# Patient Record
Sex: Male | Born: 1950
Health system: Southern US, Community
[De-identification: ages and names within clinical notes are randomized; demographics above are authoritative.]

## PROBLEM LIST (undated history)

## (undated) DIAGNOSIS — E785 Hyperlipidemia, unspecified: Secondary | ICD-10-CM

## (undated) DIAGNOSIS — M1990 Unspecified osteoarthritis, unspecified site: Secondary | ICD-10-CM

## (undated) DIAGNOSIS — F419 Anxiety disorder, unspecified: Secondary | ICD-10-CM

## (undated) DIAGNOSIS — D3A00 Benign carcinoid tumor of unspecified site: Secondary | ICD-10-CM

## (undated) DIAGNOSIS — I639 Cerebral infarction, unspecified: Secondary | ICD-10-CM

## (undated) DIAGNOSIS — K5792 Diverticulitis of intestine, part unspecified, without perforation or abscess without bleeding: Secondary | ICD-10-CM

## (undated) DIAGNOSIS — C801 Malignant (primary) neoplasm, unspecified: Secondary | ICD-10-CM

## (undated) DIAGNOSIS — Z87442 Personal history of urinary calculi: Secondary | ICD-10-CM

## (undated) DIAGNOSIS — R002 Palpitations: Secondary | ICD-10-CM

## (undated) DIAGNOSIS — H269 Unspecified cataract: Secondary | ICD-10-CM

## (undated) DIAGNOSIS — K219 Gastro-esophageal reflux disease without esophagitis: Secondary | ICD-10-CM

## (undated) DIAGNOSIS — E291 Testicular hypofunction: Secondary | ICD-10-CM

## (undated) DIAGNOSIS — G629 Polyneuropathy, unspecified: Secondary | ICD-10-CM

## (undated) DIAGNOSIS — K52832 Lymphocytic colitis: Secondary | ICD-10-CM

## (undated) DIAGNOSIS — I10 Essential (primary) hypertension: Secondary | ICD-10-CM

## (undated) DIAGNOSIS — F3289 Other specified depressive episodes: Secondary | ICD-10-CM

## (undated) DIAGNOSIS — T7840XA Allergy, unspecified, initial encounter: Secondary | ICD-10-CM

## (undated) DIAGNOSIS — J309 Allergic rhinitis, unspecified: Secondary | ICD-10-CM

## (undated) DIAGNOSIS — F329 Major depressive disorder, single episode, unspecified: Secondary | ICD-10-CM

## (undated) DIAGNOSIS — G709 Myoneural disorder, unspecified: Secondary | ICD-10-CM

## (undated) HISTORY — DX: Allergic rhinitis, unspecified: J30.9

## (undated) HISTORY — DX: Hyperlipidemia, unspecified: E78.5

## (undated) HISTORY — DX: Testicular hypofunction: E29.1

## (undated) HISTORY — DX: Other specified depressive episodes: F32.89

## (undated) HISTORY — DX: Palpitations: R00.2

## (undated) HISTORY — PX: CARDIAC CATHETERIZATION: SHX172

## (undated) HISTORY — DX: Myoneural disorder, unspecified: G70.9

## (undated) HISTORY — DX: Anxiety disorder, unspecified: F41.9

## (undated) HISTORY — DX: Essential (primary) hypertension: I10

## (undated) HISTORY — PX: TONSILLECTOMY AND ADENOIDECTOMY: SHX28

## (undated) HISTORY — DX: Unspecified cataract: H26.9

## (undated) HISTORY — DX: Polyneuropathy, unspecified: G62.9

## (undated) HISTORY — DX: Benign carcinoid tumor of unspecified site: D3A.00

## (undated) HISTORY — DX: Allergy, unspecified, initial encounter: T78.40XA

## (undated) HISTORY — DX: Cerebral infarction, unspecified: I63.9

## (undated) HISTORY — PX: POLYPECTOMY: SHX149

## (undated) HISTORY — DX: Unspecified osteoarthritis, unspecified site: M19.90

## (undated) HISTORY — DX: Personal history of urinary calculi: Z87.442

## (undated) HISTORY — DX: Lymphocytic colitis: K52.832

## (undated) HISTORY — DX: Major depressive disorder, single episode, unspecified: F32.9

## (undated) HISTORY — DX: Diverticulitis of intestine, part unspecified, without perforation or abscess without bleeding: K57.92

## (undated) HISTORY — DX: Gastro-esophageal reflux disease without esophagitis: K21.9

## (undated) HISTORY — PX: COLONOSCOPY: SHX174

---

## 1998-11-08 ENCOUNTER — Emergency Department (HOSPITAL_COMMUNITY): Admission: EM | Admit: 1998-11-08 | Discharge: 1998-11-08 | Payer: Self-pay | Admitting: Emergency Medicine

## 2000-11-04 ENCOUNTER — Ambulatory Visit (HOSPITAL_COMMUNITY): Admission: RE | Admit: 2000-11-04 | Discharge: 2000-11-04 | Payer: Self-pay | Admitting: Internal Medicine

## 2000-11-04 ENCOUNTER — Encounter: Payer: Self-pay | Admitting: Internal Medicine

## 2000-12-30 ENCOUNTER — Ambulatory Visit (HOSPITAL_COMMUNITY): Admission: RE | Admit: 2000-12-30 | Discharge: 2000-12-30 | Payer: Self-pay | Admitting: Internal Medicine

## 2004-10-06 ENCOUNTER — Emergency Department (HOSPITAL_COMMUNITY): Admission: EM | Admit: 2004-10-06 | Discharge: 2004-10-06 | Payer: Self-pay | Admitting: Emergency Medicine

## 2004-10-07 ENCOUNTER — Ambulatory Visit: Payer: Self-pay | Admitting: Internal Medicine

## 2004-10-08 ENCOUNTER — Ambulatory Visit (HOSPITAL_COMMUNITY): Admission: EM | Admit: 2004-10-08 | Discharge: 2004-10-08 | Payer: Self-pay | Admitting: Emergency Medicine

## 2005-10-05 ENCOUNTER — Ambulatory Visit (HOSPITAL_COMMUNITY): Admission: RE | Admit: 2005-10-05 | Discharge: 2005-10-05 | Payer: Self-pay | Admitting: Internal Medicine

## 2005-12-30 ENCOUNTER — Ambulatory Visit: Payer: Self-pay | Admitting: Cardiology

## 2006-01-11 ENCOUNTER — Ambulatory Visit: Payer: Self-pay | Admitting: Cardiology

## 2006-11-29 ENCOUNTER — Emergency Department (HOSPITAL_COMMUNITY): Admission: EM | Admit: 2006-11-29 | Discharge: 2006-11-29 | Payer: Self-pay | Admitting: Emergency Medicine

## 2007-05-10 ENCOUNTER — Ambulatory Visit: Payer: Self-pay | Admitting: Internal Medicine

## 2007-05-11 ENCOUNTER — Encounter: Payer: Self-pay | Admitting: Internal Medicine

## 2007-05-15 ENCOUNTER — Ambulatory Visit: Payer: Self-pay | Admitting: Internal Medicine

## 2007-05-19 ENCOUNTER — Ambulatory Visit: Payer: Self-pay | Admitting: Internal Medicine

## 2007-05-22 ENCOUNTER — Encounter: Payer: Self-pay | Admitting: Internal Medicine

## 2007-05-23 ENCOUNTER — Encounter: Payer: Self-pay | Admitting: Internal Medicine

## 2007-05-31 ENCOUNTER — Ambulatory Visit (HOSPITAL_COMMUNITY): Admission: RE | Admit: 2007-05-31 | Discharge: 2007-05-31 | Payer: Self-pay | Admitting: Internal Medicine

## 2007-06-13 DIAGNOSIS — J309 Allergic rhinitis, unspecified: Secondary | ICD-10-CM | POA: Insufficient documentation

## 2007-06-13 DIAGNOSIS — K219 Gastro-esophageal reflux disease without esophagitis: Secondary | ICD-10-CM | POA: Insufficient documentation

## 2007-06-13 DIAGNOSIS — Z9089 Acquired absence of other organs: Secondary | ICD-10-CM | POA: Insufficient documentation

## 2007-06-13 DIAGNOSIS — Z87442 Personal history of urinary calculi: Secondary | ICD-10-CM | POA: Insufficient documentation

## 2007-06-13 DIAGNOSIS — K589 Irritable bowel syndrome without diarrhea: Secondary | ICD-10-CM | POA: Insufficient documentation

## 2007-06-14 ENCOUNTER — Encounter: Admission: RE | Admit: 2007-06-14 | Discharge: 2007-06-14 | Payer: Self-pay | Admitting: Internal Medicine

## 2007-06-15 ENCOUNTER — Ambulatory Visit: Payer: Self-pay | Admitting: Internal Medicine

## 2007-06-21 ENCOUNTER — Ambulatory Visit: Payer: Self-pay | Admitting: Internal Medicine

## 2007-06-21 ENCOUNTER — Encounter: Payer: Self-pay | Admitting: Internal Medicine

## 2007-11-21 ENCOUNTER — Ambulatory Visit: Payer: Self-pay | Admitting: Internal Medicine

## 2007-11-21 DIAGNOSIS — R7989 Other specified abnormal findings of blood chemistry: Secondary | ICD-10-CM | POA: Insufficient documentation

## 2007-12-13 ENCOUNTER — Ambulatory Visit: Payer: Self-pay | Admitting: Internal Medicine

## 2008-03-04 ENCOUNTER — Telehealth (INDEPENDENT_AMBULATORY_CARE_PROVIDER_SITE_OTHER): Payer: Self-pay | Admitting: *Deleted

## 2008-03-06 ENCOUNTER — Ambulatory Visit: Payer: Self-pay | Admitting: Internal Medicine

## 2008-03-08 ENCOUNTER — Encounter: Admission: RE | Admit: 2008-03-08 | Discharge: 2008-03-08 | Payer: Self-pay | Admitting: Internal Medicine

## 2008-07-15 ENCOUNTER — Telehealth: Payer: Self-pay | Admitting: Internal Medicine

## 2008-07-23 ENCOUNTER — Ambulatory Visit: Payer: Self-pay | Admitting: Internal Medicine

## 2008-07-23 LAB — CONVERTED CEMR LAB: Testosterone: 1108.26 ng/dL — ABNORMAL HIGH (ref 350.00–890.00)

## 2008-07-28 ENCOUNTER — Encounter: Payer: Self-pay | Admitting: Internal Medicine

## 2008-11-20 ENCOUNTER — Ambulatory Visit: Payer: Self-pay | Admitting: Internal Medicine

## 2008-11-20 DIAGNOSIS — R7989 Other specified abnormal findings of blood chemistry: Secondary | ICD-10-CM | POA: Insufficient documentation

## 2008-11-20 LAB — CONVERTED CEMR LAB
ALT: 27 units/L (ref 0–53)
BUN: 12 mg/dL (ref 6–23)
Basophils Absolute: 0 10*3/uL (ref 0.0–0.1)
CO2: 30 meq/L (ref 19–32)
Calcium: 9.3 mg/dL (ref 8.4–10.5)
Cholesterol: 185 mg/dL (ref 0–200)
Eosinophils Absolute: 0.2 10*3/uL (ref 0.0–0.7)
Glucose, Bld: 99 mg/dL (ref 70–99)
Hemoglobin: 17.2 g/dL — ABNORMAL HIGH (ref 13.0–17.0)
Homocysteine: 8.5 micromoles/L (ref 4.0–15.4)
LDL Cholesterol: 118 mg/dL — ABNORMAL HIGH (ref 0–99)
Lymphs Abs: 1.8 10*3/uL (ref 0.7–4.0)
Monocytes Absolute: 0.7 10*3/uL (ref 0.1–1.0)
Neutro Abs: 3.7 10*3/uL (ref 1.4–7.7)
Neutrophils Relative %: 58 % (ref 43.0–77.0)
Testosterone: 206.13 ng/dL — ABNORMAL LOW (ref 350.00–890.00)
Total Bilirubin: 1.4 mg/dL — ABNORMAL HIGH (ref 0.3–1.2)
Total Protein: 7.2 g/dL (ref 6.0–8.3)

## 2009-02-13 ENCOUNTER — Telehealth: Payer: Self-pay | Admitting: Internal Medicine

## 2009-02-14 ENCOUNTER — Ambulatory Visit: Payer: Self-pay | Admitting: Internal Medicine

## 2009-04-05 DIAGNOSIS — D3A00 Benign carcinoid tumor of unspecified site: Secondary | ICD-10-CM

## 2009-04-05 HISTORY — DX: Benign carcinoid tumor of unspecified site: D3A.00

## 2009-06-03 ENCOUNTER — Telehealth: Payer: Self-pay | Admitting: Internal Medicine

## 2009-09-03 HISTORY — PX: OTHER SURGICAL HISTORY: SHX169

## 2009-09-08 ENCOUNTER — Ambulatory Visit: Payer: Self-pay | Admitting: Endocrinology

## 2009-09-08 LAB — CONVERTED CEMR LAB
Basophils Absolute: 0 10*3/uL (ref 0.0–0.1)
Basophils Relative: 0.3 % (ref 0.0–3.0)
Eosinophils Relative: 1.3 % (ref 0.0–5.0)
HCT: 50.6 % (ref 39.0–52.0)
Ketones, ur: NEGATIVE mg/dL
Lymphocytes Relative: 13.8 % (ref 12.0–46.0)
Monocytes Relative: 11.7 % (ref 3.0–12.0)
Nitrite: NEGATIVE
RBC: 5.27 M/uL (ref 4.22–5.81)
RDW: 11.9 % (ref 11.5–14.6)
Urine Glucose: NEGATIVE mg/dL
Urobilinogen, UA: 0.2 (ref 0.0–1.0)

## 2009-09-09 ENCOUNTER — Encounter: Admission: RE | Admit: 2009-09-09 | Discharge: 2009-09-09 | Payer: Self-pay | Admitting: Endocrinology

## 2009-09-09 ENCOUNTER — Telehealth (INDEPENDENT_AMBULATORY_CARE_PROVIDER_SITE_OTHER): Payer: Self-pay | Admitting: *Deleted

## 2009-09-09 ENCOUNTER — Telehealth: Payer: Self-pay | Admitting: Endocrinology

## 2009-09-09 DIAGNOSIS — R911 Solitary pulmonary nodule: Secondary | ICD-10-CM | POA: Insufficient documentation

## 2009-09-10 ENCOUNTER — Encounter: Admission: RE | Admit: 2009-09-10 | Discharge: 2009-09-10 | Payer: Self-pay | Admitting: Obstetrics and Gynecology

## 2009-09-10 ENCOUNTER — Telehealth: Payer: Self-pay | Admitting: Endocrinology

## 2009-09-11 ENCOUNTER — Encounter (INDEPENDENT_AMBULATORY_CARE_PROVIDER_SITE_OTHER): Payer: Self-pay | Admitting: *Deleted

## 2009-09-17 ENCOUNTER — Ambulatory Visit (HOSPITAL_COMMUNITY): Admission: RE | Admit: 2009-09-17 | Discharge: 2009-09-17 | Payer: Self-pay | Admitting: Internal Medicine

## 2009-09-18 ENCOUNTER — Telehealth: Payer: Self-pay | Admitting: Internal Medicine

## 2009-09-23 ENCOUNTER — Ambulatory Visit: Payer: Self-pay | Admitting: Thoracic Surgery

## 2009-09-24 ENCOUNTER — Ambulatory Visit (HOSPITAL_COMMUNITY): Admission: RE | Admit: 2009-09-24 | Discharge: 2009-09-24 | Payer: Self-pay | Admitting: Thoracic Surgery

## 2009-09-25 ENCOUNTER — Encounter: Admission: RE | Admit: 2009-09-25 | Discharge: 2009-09-25 | Payer: Self-pay | Admitting: Thoracic Surgery

## 2009-09-29 ENCOUNTER — Telehealth: Payer: Self-pay | Admitting: Internal Medicine

## 2009-09-29 ENCOUNTER — Encounter: Payer: Self-pay | Admitting: Internal Medicine

## 2009-09-29 ENCOUNTER — Ambulatory Visit: Payer: Self-pay | Admitting: Cardiothoracic Surgery

## 2009-09-29 ENCOUNTER — Ambulatory Visit: Admission: RE | Admit: 2009-09-29 | Discharge: 2009-09-29 | Payer: Self-pay | Admitting: Thoracic Surgery

## 2009-09-30 ENCOUNTER — Encounter (INDEPENDENT_AMBULATORY_CARE_PROVIDER_SITE_OTHER): Payer: Self-pay | Admitting: *Deleted

## 2009-09-30 ENCOUNTER — Encounter: Admission: RE | Admit: 2009-09-30 | Discharge: 2009-09-30 | Payer: Self-pay | Admitting: Internal Medicine

## 2009-10-02 ENCOUNTER — Ambulatory Visit: Payer: Self-pay | Admitting: Cardiothoracic Surgery

## 2009-10-07 ENCOUNTER — Inpatient Hospital Stay (HOSPITAL_COMMUNITY): Admission: RE | Admit: 2009-10-07 | Discharge: 2009-10-11 | Payer: Self-pay | Admitting: Cardiothoracic Surgery

## 2009-10-07 ENCOUNTER — Encounter: Payer: Self-pay | Admitting: Cardiothoracic Surgery

## 2009-10-07 ENCOUNTER — Ambulatory Visit: Payer: Self-pay | Admitting: Cardiothoracic Surgery

## 2009-10-16 ENCOUNTER — Ambulatory Visit: Payer: Self-pay | Admitting: Cardiothoracic Surgery

## 2009-10-28 ENCOUNTER — Ambulatory Visit: Payer: Self-pay | Admitting: Cardiothoracic Surgery

## 2009-10-28 ENCOUNTER — Encounter: Admission: RE | Admit: 2009-10-28 | Discharge: 2009-10-28 | Payer: Self-pay | Admitting: Cardiothoracic Surgery

## 2009-10-28 ENCOUNTER — Encounter: Payer: Self-pay | Admitting: Internal Medicine

## 2009-10-29 ENCOUNTER — Ambulatory Visit: Payer: Self-pay | Admitting: Internal Medicine

## 2009-11-03 ENCOUNTER — Encounter: Payer: Self-pay | Admitting: Internal Medicine

## 2009-11-03 LAB — CBC WITH DIFFERENTIAL/PLATELET
Eosinophils Absolute: 0.2 10*3/uL (ref 0.0–0.5)
HCT: 44 % (ref 38.4–49.9)
LYMPH%: 31.2 % (ref 14.0–49.0)
MCHC: 34 g/dL (ref 32.0–36.0)
MCV: 94.6 fL (ref 79.3–98.0)
MONO%: 11 % (ref 0.0–14.0)
NEUT#: 3.4 10*3/uL (ref 1.5–6.5)
NEUT%: 54.5 % (ref 39.0–75.0)
Platelets: 188 10*3/uL (ref 140–400)
RBC: 4.65 10*6/uL (ref 4.20–5.82)

## 2009-11-03 LAB — COMPREHENSIVE METABOLIC PANEL
Alkaline Phosphatase: 66 U/L (ref 39–117)
Creatinine, Ser: 0.95 mg/dL (ref 0.40–1.50)
Glucose, Bld: 93 mg/dL (ref 70–99)
Sodium: 140 mEq/L (ref 135–145)
Total Bilirubin: 1.3 mg/dL — ABNORMAL HIGH (ref 0.3–1.2)
Total Protein: 6.8 g/dL (ref 6.0–8.3)

## 2009-12-24 ENCOUNTER — Telehealth: Payer: Self-pay | Admitting: Internal Medicine

## 2010-02-05 ENCOUNTER — Ambulatory Visit: Payer: Self-pay | Admitting: Cardiothoracic Surgery

## 2010-02-05 ENCOUNTER — Encounter: Admission: RE | Admit: 2010-02-05 | Discharge: 2010-02-05 | Payer: Self-pay | Admitting: Cardiothoracic Surgery

## 2010-02-05 ENCOUNTER — Encounter: Payer: Self-pay | Admitting: Internal Medicine

## 2010-02-16 ENCOUNTER — Ambulatory Visit: Payer: Self-pay | Admitting: Internal Medicine

## 2010-02-16 DIAGNOSIS — F32A Depression, unspecified: Secondary | ICD-10-CM | POA: Insufficient documentation

## 2010-02-16 DIAGNOSIS — F329 Major depressive disorder, single episode, unspecified: Secondary | ICD-10-CM

## 2010-02-16 DIAGNOSIS — R209 Unspecified disturbances of skin sensation: Secondary | ICD-10-CM | POA: Insufficient documentation

## 2010-02-16 LAB — CONVERTED CEMR LAB
BUN: 13 mg/dL (ref 6–23)
CO2: 29 meq/L (ref 19–32)
Chloride: 101 meq/L (ref 96–112)
Creatinine, Ser: 1 mg/dL (ref 0.4–1.5)
Folate: 15.2 ng/mL
GFR calc non Af Amer: 82.99 mL/min (ref >60)
Glucose, Bld: 97 mg/dL (ref 70–99)
Hgb A1c MFr Bld: 5.5 % (ref 4.6–6.5)
Iron: 102 ug/dL (ref 42–165)
Sodium: 137 meq/L (ref 135–145)
TSH: 2.07 microintl units/mL (ref 0.35–5.50)

## 2010-05-04 ENCOUNTER — Other Ambulatory Visit: Payer: Self-pay | Admitting: Internal Medicine

## 2010-05-04 DIAGNOSIS — C3490 Malignant neoplasm of unspecified part of unspecified bronchus or lung: Secondary | ICD-10-CM

## 2010-05-07 NOTE — Letter (Signed)
Summary: [facility]  [facility]   Imported By: Sherian Rein 11/11/2009 10:51:58  _____________________________________________________________________  External Attachment:    Type:   Image     Comment:   External Document

## 2010-05-07 NOTE — Progress Notes (Signed)
  Phone Note Refill Request Message from:  Fax from Pharmacy on June 03, 2009 9:38 AM  Refills Requested: Medication #1:  KLONOPIN 0.5 MG  TABS Take 1 tablet by mouth two times a day as needed   Last Refilled: 04/23/2009 Please Advise refill, [facility] [phone])  Initial call taken by: Ami Bullins CMA,  June 03, 2009 9:39 AM  Follow-up for Phone Call        ok to refill x 5 Follow-up by: Jacques Navy MD,  June 03, 2009 11:48 AM    Prescriptions: KLONOPIN 0.5 MG  TABS (CLONAZEPAM) Take 1 tablet by mouth two times a day as needed  #60 x 6   Entered by:   Ami Bullins CMA   Authorized by:   Jacques Navy MD   Signed by:   Bill Salinas CMA on 06/03/2009   Method used:   Telephoned to ...       [facility] [address] (retail)       [address]       [place], [place]  [address]       Ph: [phone] or [phone]       Fax: [phone]   RxID:   [id number]

## 2010-05-07 NOTE — Progress Notes (Signed)
  Phone Note Refill Request Message from:  Fax from Pharmacy on December 24, 2009 9:46 AM  Refills Requested: Medication #1:  KLONOPIN 0.5 MG  TABS Take 1 tablet by mouth two times a day as needed   Last Refilled: 11/24/2009 Please Advise refill, [facility] [address]  Initial call taken by: Ami Bullins CMA,  December 24, 2009 9:46 AM  Follow-up for Phone Call        ok to refill x 5 Follow-up by: Jacques Navy MD,  December 24, 2009 1:10 PM    Prescriptions: KLONOPIN 0.5 MG  TABS (CLONAZEPAM) Take 1 tablet by mouth two times a day as needed  #60 x 5   Entered by:   Ami Bullins CMA   Authorized by:   Jacques Navy MD   Signed by:   Bill Salinas CMA on 12/25/2009   Method used:   Telephoned to ...       [facility] [address] (retail)       [address]       [place], [place]  [address]       Ph: [phone] or [phone]       Fax: [phone]   RxID:   [id number]

## 2010-05-07 NOTE — Letter (Signed)
Summary: [facility]  [facility]   Imported By: Lennie Odor 10/27/2009 16:09:10  _____________________________________________________________________  External Attachment:    Type:   Image     Comment:   External Document

## 2010-05-07 NOTE — Progress Notes (Signed)
Summary: Rx req  Phone Note Call from Patient Call back at Home Phone [phone]   Caller: Patient Summary of Call: Pt called stating that he is having significant spasms due to Diverticulitis. Pt is requesting Rx to treat, please advise. Initial call taken by: Margaret Pyle, CMA,  September 10, 2009 8:53 AM  Follow-up for Phone Call        i sent rx Follow-up by: Minus Breeding MD,  September 10, 2009 9:28 AM  Additional Follow-up for Phone Call Additional follow up Details #1::        pt informed Additional Follow-up by: Margaret Pyle, CMA,  September 10, 2009 9:51 AM    New/Updated Medications: HYOSCYAMINE SULFATE 0.125 MG TABS (HYOSCYAMINE SULFATE) 1 every 4 hrs as needed for spasms Prescriptions: HYOSCYAMINE SULFATE 0.125 MG TABS (HYOSCYAMINE SULFATE) 1 every 4 hrs as needed for spasms  #30 x 1   Entered and Authorized by:   Minus Breeding MD   Signed by:   Minus Breeding MD on 09/10/2009   Method used:   Electronically to        [facility] [phone]* (retail)       [address]       [place], [place]  [address]       Ph: [phone] or [phone]       Fax: [phone]   RxID:   [id number]

## 2010-05-07 NOTE — Letter (Signed)
Summary: [facility]  [facility]   Imported By: Sherian Rein 02/20/2010 10:32:54  _____________________________________________________________________  External Attachment:    Type:   Image     Comment:   External Document

## 2010-05-07 NOTE — Letter (Signed)
[facility]   Imported By: Sherian Rein 11/19/2009 11:29:38  _____________________________________________________________________  External Attachment:    Type:   Image     Comment:   External Document

## 2010-05-07 NOTE — Miscellaneous (Signed)
Summary: Orders Update   Clinical Lists Changes  Orders: Added new Referral order of Radiology Referral (Radiology) - Signed 

## 2010-05-07 NOTE — Miscellaneous (Signed)
Summary: Orders Update   Clinical Lists Changes  Orders: Added new Referral order of Radiology Referral (Radiology) - Signed Added new Referral order of Surgical Referral (Surgery) - Signed

## 2010-05-07 NOTE — Progress Notes (Signed)
  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 Pt has abx from Dr Everardo All. Initial call taken by: Loreen Freud DO,  September 09, 2009 5:27 PM

## 2010-05-07 NOTE — Progress Notes (Signed)
  Phone Note Outgoing Call   Summary of Call: called to check on diverticulitis. PET scan shows continued inflammation and wall thickening. He is still having pain. Will renew augmentin 875 two times a day x 7  Discussed PET and pulmonary nodule. Best would be [facility] with surgery for cure. He is being referred to Dr. Edwyna Shell. Initial call taken by: Jacques Navy MD,  September 18, 2009 8:19 AM    Prescriptions: AUGMENTIN 875-125 MG TABS (AMOXICILLIN-POT CLAVULANATE) 1 tab three times a day  #21 x 0   Entered and Authorized by:   Jacques Navy MD   Signed by:   Jacques Navy MD on 09/18/2009   Method used:   Electronically to        [facility] [phone]* (retail)       [address]       [place], [place]  [address]       Ph: [phone] or [phone]       Fax: [phone]   RxID:   [id number]

## 2010-05-07 NOTE — Progress Notes (Signed)
Summary: LLQ pain  Phone Note Call from Patient Call back at Home Phone [phone]   Caller: Patient Summary of Call: pt called stating that pain in LLQ has increased. Pt is requesting CT with orders to [facility] where he works. Pt is also requesting Rx for pain meds, please advise. Initial call taken by: Margaret Pyle, CMA,  September 09, 2009 9:08 AM  Follow-up for Phone Call        we need bun and creat v58.69. i have ordered ct  Additional Follow-up for Phone Call Additional follow up Details #1::        Pt informed, [facility]ed [facility] [address] per pt request Additional Follow-up by: Margaret Pyle, CMA,  September 09, 2009 10:23 AM    New/Updated Medications: VICODIN 5-500 MG TABS (HYDROCODONE-ACETAMINOPHEN) 1 every 4 hrs as needed for pain Prescriptions: VICODIN 5-500 MG TABS (HYDROCODONE-ACETAMINOPHEN) 1 every 4 hrs as needed for pain  #50 x 0   Entered and Authorized by:   Minus Breeding MD   Signed by:   Minus Breeding MD on 09/09/2009   Method used:   Printed then faxed to ...       [facility] [address] (retail)       [address]       [place], [place]  [address]       Ph: [phone] or [phone]       Fax: [phone]   RxID:   [id number]

## 2010-05-07 NOTE — Assessment & Plan Note (Signed)
Summary: DIVERTICULITIS? /NWS   Vital Signs:  Patient profile:   60 year old male Height:      70 inches (177.80 cm) Weight:      177.12 pounds (80.51 kg) O2 Sat:      94 % on Room air Temp:     98.3 degrees F (36.83 degrees C) oral Pulse rate:   78 / minute BP sitting:   130 / 72  (left arm) Cuff size:   regular  Vitals Entered By: Orlan Leavens (September 08, 2009 9:06 AM)  O2 Flow:  Room air CC: diverticulitis flare-up Is Patient Diabetic? No   Primary Provider:  Nor[facility]  CC:  diverticulitis flare-up.  History of Present Illness: 2 days of moderate pain at the left lower quadrant.  the pain is temporarily helped by bowel movement.  no assoc fever.  he has h/o urolithiasis, but says this pain is much different.    Current Medications (verified): 1)  Klonopin 0.5 Mg  Tabs (Clonazepam) .... Take 1 Tablet By Mouth Two Times A Day As Needed 2)  Vitamin D 2000 Unit  Tabs (Cholecalciferol) .... Take 1 Tablet By Mouth Once A Day 3)  Fish Oil 1000 Mg  Caps (Omega-3 Fatty Acids) .... Take 1 Tablet By Mouth Three Times A Day 4)  Multivitamins   Tabs (Multiple Vitamin) .... Take 1 Tablet By Mouth Once A Day 5)  Fexofenadine Hcl 180 Mg Tabs (Fexofenadine Hcl) .Marland Kitchen.. 1 By Mouth Once Daily For Allergy 6)  Aerochamber W/flowsignal  Misc (Spacer/aero-Holding [place]) .... Use With Ventolin 7)  Ventolin Hfa 108 (90 Base) Mcg/act Aers (Albuterol Sulfate) .... 2 Puffs Q 6 Hrs As Needed Wheezing 8)  Testosterone Enanthate 200 Mg/ml Oil (Testosterone Enanthate) .... 200mg  Im Monthly 9)  B Complex .Marland KitchenMarland Kitchen. 1 Daily 10)  Co Q10 .Marland KitchenMarland Kitchen. 1 Daily 11)  Pepto Bismol .... 2-3 Times A Day 12)  Vitamin C 500mg  .... 1 Daily 13)  Reveratrol 14)  Wellbutrin Xl 150 Mg Xr24h-Tab (Bupropion Hcl) .Marland Kitchen.. 1 By Mouth Once Daily  Allergies (verified): 1)  ! * Bee Stings 2)  Nsaids  Past History:  Past Medical History: Last updated: 11/21/2007 DEPRESSION, HX OF (ICD-V11.8) GERD (ICD-530.81) Hx of ALLERGIC RHINITIS  (ICD-477.9) UROLITHIASIS, HX OF (ICD-V13.01) IRRITABLE BOWEL SYNDROME, HX OF (ICD-V12.79) DIARRHEA (ICD-787.91) Hypogonadism      Review of Systems  The patient denies hematochezia and hematuria.    Physical Exam  General:  normal appearance.   Abdomen:  abdomen is soft.  there is moderate point tenderness at the left lower quadrant.  no hepatosplenomegaly.   not distended.  no hernia  Additional Exam:  White Cell Count          8.7 K/uL                    4.5-10.5   Red Cell Count            5.27 Mil/uL                 4.22-5.81   Hemoglobin           [H]  17.6 g/dL                   04.5-40.9   Hematocrit                50.6 %  39.0-52.0    Platelet Count            212.0 K/uL                  150.0-400.0    Tests: (2) UDip w/Micro (URINE)   Color                     YELLOW       RANGE:  Yellow;Lt. Yellow   Clarity                   CLEAR                       Clear   Specific Gravity          1.025                       1.000 - 1.030   Urine Ph                  6.0                         5.0-8.0   Protein                   NEGATIVE                    Negative   Urine Glucose             NEGATIVE                    Negative   Ketones                   NEGATIVE                    Negative   Urine Bilirubin           NEGATIVE                    Negative   Blood                     NEGATIVE                    Negative   Urobilinogen              0.2                         0.0 - 1.0   Leukocyte Esterace        NEGATIVE                    Negative   Nitrite             Impression & Recommendations:  Problem # 1:  ABDOMINAL PAIN, UNSPECIFIED SITE (ICD-789.00) most likely dx is diverticulitis  Medications Added to Medication List This Visit: 1)  Augmentin 875-125 Mg Tabs (Amoxicillin-pot clavulanate) .Marland Kitchen.. 1 tab three times a day 2)  Metronidazole 500 Mg Tabs (Metronidazole) .Marland Kitchen.. 1 tab three times a day  Other Orders: TLB-CBC Platelet - w/Differential  (85025-CBCD) TLB-Udip w/ Micro (81001-URINE) Est. Patient Level III ([address])  Patient Instructions: 1)  continue metamucil. 2)  augmentin 875 mg three times a day 3)  metronidazole 500 mg three times a day 4)  blood and urine tests are being ordered for you today.  please call [phone] to hear your test results. 5)  please call us in 2 days if you are not feeling better. 6)  (update: i left message on phone-tree:  rx as we discussed) Prescriptions: METRONIDAZOLE 500 MG TABS (METRONIDAZOLE) 1 tab three times a day  #21 x 0   Entered and Authorized by:   Minus Breeding MD   Signed by:   Minus Breeding MD on 09/08/2009   Method used:   [facility] to        [facility] [phone]* (retail)       [address]       [place], [place]  [address]       Ph: [phone] or [phone]       Fax: [phone]   RxID:   [id number] AUGMENTIN 875-125 MG TABS (AMOXICILLIN-POT CLAVULANATE) 1 tab three times a day  #21 x 0   Entered and Authorized by:   Minus Breeding MD   Signed by:   Minus Breeding MD on 09/08/2009   Method used:   Electronically to        [facility] [phone]* (retail)       [address]       [place], [place]  [address]       Ph: [phone] or [phone]       Fax: [phone]   RxID:   [id number]

## 2010-05-07 NOTE — Assessment & Plan Note (Signed)
Summary: FOOT PAIN  D/T REFUSED ANOTHER MD--[facility]   Vital Signs:  Patient profile:   60 year old male Height:      70 inches Weight:      177 pounds BMI:     25.49 O2 Sat:      97 % on Room air Temp:     97.6 degrees F oral Pulse rate:   73 / minute BP sitting:   118 / 84  (left arm) Cuff size:   regular  Vitals Entered By: Bill Salinas CMA (February 16, 2010 11:35 AM)  O2 Flow:  Room air CC: pt here with c/o nueropathy in both feet. Pain worse at night. constant pain/ ab   Primary Care Provider:  Norins  CC:  pt here with c/o nueropathy in both feet. Pain worse at night. constant pain/ ab.  History of Present Illness: Patient presents with several months of pain in the feet and legs. He reports that after he recovered from his minithoracotomy and resection he started running. He then developed significant pain in his feet. He stopped running for walking but soon had to give this up as well due to on-going discomfort. He then developed a tingling sensation in is hands and a burning pain in his feet. He is concerned about a nueropathy of some sort. He has eliminated caffein, artificial sweeteners from his diet. He has been taking a Vt B supplement. He reports some improvement but still is symptomatic. He has concerns for possible diabetic etiology vs B12 deficiency vs some relationship to his surgery.   He also reports that he has stopped testosterone replacement on advice of his oncologist, seen due to his excised carcinoid tumor-lung. He does report a general decline in overall sense of well-being vis-a-vis endurance, quick and sharp mind, etc. and would like a testosterone level checked.   Current Medications (verified): 1)  Klonopin 0.5 Mg  Tabs (Clonazepam) .... Take 1 Tablet By Mouth Two Times A Day As Needed 2)  Vitamin D 2000 Unit  Tabs (Cholecalciferol) .... Take 1 Tablet By Mouth Once A Day 3)  Fish Oil 1000 Mg  Caps (Omega-3 Fatty Acids) .... Take 1 Tablet By Mouth Three Times  A Day 4)  Multivitamins   Tabs (Multiple Vitamin) .... Take 1 Tablet By Mouth Once A Day 5)  Fexofenadine Hcl 180 Mg Tabs (Fexofenadine Hcl) .Marland Kitchen.. 1 By Mouth Once Daily For Allergy 6)  B Complex .Marland KitchenMarland Kitchen. 1 Daily 7)  Co Q10 .Marland KitchenMarland Kitchen. 1 Daily 8)  Pepto Bismol .... 2-3 Times A Day 9)  Vitamin C 500mg  .... 1 Daily 10)  Reveratrol 11)  Wellbutrin Xl 150 Mg Xr24h-Tab (Bupropion Hcl) .Marland Kitchen.. 1 By Mouth Once Daily 12)  Metronidazole 500 Mg Tabs (Metronidazole) .Marland Kitchen.. 1 Tab Three Times A Day 13)  Hyoscyamine Sulfate 0.125 Mg Tabs (Hyoscyamine Sulfate) .Marland Kitchen.. 1 Every 4 Hrs As Needed For Spasms 14)  Metanx 3-35-2 Mg Tabs (L-Methylfolate-B6-B12) .Marland Kitchen.. 1 Tablet Daily  Allergies (verified): 1)  ! 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    Physical Exam  General:  Well-developed,well-nourished,in no acute distress; alert,appropriate and cooperative throughout examination Head:  normocephalic and  atraumatic.   Eyes:  vision grossly intact, pupils equal, and pupils round.  C&S clear Msk:  no joint tenderness, no joint swelling, no joint warmth, and no joint deformities.  Tender to pressure at the heel, at the in-step mid-foot. Pulses:  2+ PT and DP pulses Neurologic:  alert & oriented X3, sensation intact to light touch, and sensation intact to pinprick, slight decrease in deep vibratory sensation.   Skin:  turgor normal, color normal, and no rashes.   Psych:  normally interactive, good eye contact, and not anxious appearing.     Impression & Recommendations:  Problem # 1:  PARESTHESIA (ICD-782.0) Persistent but improved burning paresthesia in his feet and some tingling in his fingers. Good circulation. minimal neurologic deficit. Concerned for prolonged plantar fascititis and tendonitis of the feet vs metabolic derangement.  Plan - labs to r/o metabolic abnormality.           if labs are all normal can consider EMG/NCS vs trial of empiric treatment with either elavil or neurontin           recommended myofascial release exercise for feet and directed him to youtube for demonstration videos.  Orders: TLB-B12 + Folate Pnl (16109_60454-U98/JXB) TLB-IBC Pnl (Iron/FE;Transferrin) (83550-IBC) TLB-TSH (Thyroid Stimulating Hormone) (84443-TSH) TLB-BMP (Basic Metabolic Panel-BMET) (80048-METABOL) TLB-A1C / Hgb A1C (Glycohemoglobin) (83036-A1C) TLB-Testosterone, Total (84403-TESTO)  Addendum - normal labs with suprnormal B12 level  Plan - will send in Rx for elavil 50mg  at bedtime as a trial  Problem # 2:  HYPOGONADISM, MALE (ICD-257.2) Testosterone level returns as low. May be a cuase of some symptoms.   Complete Medication List: 1)  Klonopin 0.5 Mg Tabs (Clonazepam) .... Take 1 tablet by mouth two times a day as needed 2)  Vitamin D 2000 Unit Tabs (Cholecalciferol) .... Take 1 tablet by mouth once a day 3)  Fish Oil 1000 Mg Caps (Omega-3 fatty acids) .... Take 1 tablet by mouth  three times a day 4)  Multivitamins Tabs (Multiple vitamin) .... Take 1 tablet by mouth once a day 5)  Fexofenadine Hcl 180 Mg Tabs (Fexofenadine hcl) .Marland Kitchen.. 1 by mouth once daily for allergy 6)  B Complex  .Marland KitchenMarland Kitchen. 1 daily 7)  Co Q10  .Marland Kitchen.. 1 daily 8)  Pepto Bismol  .... 2-3 times a day 9)  Vitamin C 500mg   .... 1 daily 10)  Reveratrol  11)  Wellbutrin Xl 150 Mg Xr24h-tab (Bupropion hcl) .Marland Kitchen.. 1 by mouth once daily 12)  Metronidazole 500 Mg Tabs (Metronidazole) .Marland Kitchen.. 1 tab three times a day 13)  Hyoscyamine Sulfate 0.125 Mg Tabs (Hyoscyamine sulfate) .Marland Kitchen.. 1 every 4 hrs as needed for spasms 14)  Metanx 3-35-2 Mg Tabs (L-methylfolate-b6-b12) .Marland Kitchen.. 1 tablet daily 15)  Amitriptyline Hcl 50 Mg Tabs (Amitriptyline hcl) .Marland Kitchen.. 1 by mouth at bedtime for paresthesia   Patient: [name] Page Note: All result statuses are Final unless otherwise noted.  Tests: (1) B12 + Folate Panel (B12/FOL)   Vitamin B12          [H]  >1500  pg/mL                 211-911   Folate                    15.2 ng/mL     Deficient  0.4 - 3.4 ng/mL     Indeterminate  3.4 - 5.4 ng/mL     Normal  >5.4 ng/mL  Tests: (2) IBC Panel (IBC)   Iron                      102 ug/dL                   16-109   Transferrin               263.9 mg/dL                 604.5-409.8   Iron Saturation           27.6 %                      20.0-50.0  Tests: (3) TSH (TSH)   FastTSH                   2.07 uIU/mL                 0.35-5.50  Tests: (4) BMP (METABOL)   Sodium                    137 mEq/L                   135-145   Potassium                 4.4 mEq/L                   3.5-5.1   Chloride                  101 mEq/L                   96-112   Carbon Dioxide            29 mEq/L                    19-32   Glucose                   97 mg/dL                    11-91   BUN                       13 mg/dL                    4-78   Creatinine                1.0 mg/dL                   2.9-5.6   Calcium                   9.4 mg/dL                    2.1-30.8   GFR  82.99 mL/min                >60  Tests: (5) Hemoglobin A1C (A1C)   Hemoglobin A1C            5.5 %                       4.6-6.5     Glycemic Control Guidelines for People with Diabetes:     Non Diabetic:  <6%     Goal of Therapy: <7%     Additional Action Suggested:  >8%   Tests: (6) Testosterone, Total (TESTO)   Testosterone         [L]  245.75 ng/dL                119.14-782.95AOZHYQMVHQION: AMITRIPTYLINE HCL 50 MG TABS (AMITRIPTYLINE HCL) 1 by mouth at bedtime for paresthesia  #30 x 2   Entered and Authorized by:   Jacques Navy MD   Signed by:   Jacques Navy MD on 02/16/2010   Method used:   [facility]ronically to        [facility] [phone]* (retail)       [address]       [place], [place]  [address]       Ph: [phone] or [phone]       Fax: [phone]   RxID:   [id number]    Orders Added: 1)  TLB-B12 + Folate Pnl [82746_82607-B12/FOL] 2)  TLB-IBC Pnl (Iron/FE;Transferrin) [83550-IBC] 3)  TLB-TSH (Thyroid Stimulating Hormone) [84443-TSH] 4)  TLB-BMP (Basic Metabolic Panel-BMET) [80048-METABOL] 5)  TLB-A1C / Hgb A1C (Glycohemoglobin) [83036-A1C] 6)  TLB-Testosterone, Total [84403-TESTO] 7)  Est. Patient Level III [01601]

## 2010-05-07 NOTE — Progress Notes (Signed)
Summary: CT Chest?  Phone Note Call from Patient Call back at Home Phone [phone]   Caller: Patient Summary of Call: Pt called stating he spoke with Radiologist at [facility], where he also works, and Radiologist suggests pt have CT Chest w contrast. Pt is requesting MD change radiology order to with contrast. Please advise. Initial call taken by: Margaret Pyle, CMA,  September 10, 2009 10:22 AM  Follow-up for Phone Call        i changed Follow-up by: Minus Breeding MD,  September 10, 2009 12:51 PM  Additional Follow-up for Phone Call Additional follow up Details #1::        Pt informed Additional Follow-up by: Margaret Pyle, CMA,  September 10, 2009 2:19 PM

## 2010-05-07 NOTE — Progress Notes (Signed)
  Phone Note Call from Patient   Reason for Call: Acute Illness Summary of Call: 1) VATS procedure cancelled due to Dr. Edwyna Shell sustaining a fracture of the shoulder 2) completed 14 days of Augmentin for diverticulitis but he has recurrent dull pain in the LLQ. No fever, no acute pain, no change in bowel habit. Plan - will add on an additional 5 days of augmentin 875 two times a day.  eScribed to pharmacy. Initial call taken by: Jacques Navy MD,  September 29, 2009 8:53 AM    Prescriptions: AUGMENTIN 875-125 MG TABS (AMOXICILLIN-POT CLAVULANATE) 1 tab three times a day  #10 x 0   Entered and Authorized by:   Jacques Navy MD   Signed by:   Jacques Navy MD on 09/29/2009   Method used:   [facility] [phone][facility] (retail)       [address]       [place], [place]  [address]       Ph: [phone] or [phone]       Fax: [phone]   RxID:   [id number]

## 2010-05-15 ENCOUNTER — Other Ambulatory Visit: Payer: Self-pay

## 2010-05-15 ENCOUNTER — Other Ambulatory Visit: Payer: Self-pay | Admitting: Internal Medicine

## 2010-05-15 ENCOUNTER — Ambulatory Visit
Admission: RE | Admit: 2010-05-15 | Discharge: 2010-05-15 | Disposition: A | Discharge status: Home / Self Care | Payer: PRIVATE HEALTH INSURANCE | Source: Ambulatory Visit | Attending: Internal Medicine | Admitting: Internal Medicine

## 2010-05-15 DIAGNOSIS — C3490 Malignant neoplasm of unspecified part of unspecified bronchus or lung: Secondary | ICD-10-CM

## 2010-05-15 MED ORDER — IOHEXOL 300 MG/ML  SOLN: 100.0000 mL | Freq: Once | INTRAMUSCULAR | Status: AC | PRN

## 2010-05-18 ENCOUNTER — Other Ambulatory Visit: Payer: Self-pay

## 2010-05-19 ENCOUNTER — Encounter: Payer: Self-pay | Admitting: Internal Medicine

## 2010-05-19 ENCOUNTER — Encounter (HOSPITAL_BASED_OUTPATIENT_CLINIC_OR_DEPARTMENT_OTHER): Payer: PRIVATE HEALTH INSURANCE | Admitting: Internal Medicine

## 2010-05-19 DIAGNOSIS — C7A090 Malignant carcinoid tumor of the bronchus and lung: Secondary | ICD-10-CM

## 2010-06-11 NOTE — Letter (Signed)
Summary: Si Gaul MD  Si Gaul MD   Imported By: Lester Calvert 06/04/2010 09:03:08  _____________________________________________________________________  External Attachment:    Type:   Image     Comment:   External Document

## 2010-06-19 ENCOUNTER — Ambulatory Visit (INDEPENDENT_AMBULATORY_CARE_PROVIDER_SITE_OTHER): Payer: PRIVATE HEALTH INSURANCE | Admitting: Internal Medicine

## 2010-06-19 ENCOUNTER — Encounter: Payer: Self-pay | Admitting: Internal Medicine

## 2010-06-19 DIAGNOSIS — H6690 Otitis media, unspecified, unspecified ear: Secondary | ICD-10-CM | POA: Insufficient documentation

## 2010-06-21 LAB — TYPE AND SCREEN
ABO/RH(D): A POS
Antibody Screen: NEGATIVE

## 2010-06-21 LAB — BASIC METABOLIC PANEL
BUN: 6 mg/dL (ref 6–23)
CO2: 26 mEq/L (ref 19–32)
Calcium: 8.3 mg/dL — ABNORMAL LOW (ref 8.4–10.5)
Chloride: 105 mEq/L (ref 96–112)
Creatinine, Ser: 0.78 mg/dL (ref 0.4–1.5)
GFR calc Af Amer: >60 mL/min (ref >60)
GFR calc non Af Amer: >60 mL/min (ref >60)
Glucose, Bld: 124 mg/dL — ABNORMAL HIGH (ref 70–99)
Potassium: 3.7 mEq/L (ref 3.5–5.1)
Sodium: 136 mEq/L (ref 135–145)

## 2010-06-21 LAB — URINALYSIS, ROUTINE W REFLEX MICROSCOPIC
Bilirubin Urine: NEGATIVE
Bilirubin Urine: NEGATIVE
Glucose, UA: NEGATIVE mg/dL
Glucose, UA: NEGATIVE mg/dL
Hgb urine dipstick: NEGATIVE
Hgb urine dipstick: NEGATIVE
Ketones, ur: NEGATIVE mg/dL
Ketones, ur: NEGATIVE mg/dL
Nitrite: NEGATIVE
Protein, ur: NEGATIVE mg/dL
Protein, ur: NEGATIVE mg/dL
Specific Gravity, Urine: 1.009 (ref 1.005–1.030)
Urobilinogen, UA: 0.2 mg/dL (ref 0.0–1.0)
Urobilinogen, UA: 0.2 mg/dL (ref 0.0–1.0)
pH: 5 (ref 5.0–8.0)

## 2010-06-21 LAB — POCT I-STAT 3, ART BLOOD GAS (G3+)
Acid-Base Excess: 3 mmol/L — ABNORMAL HIGH (ref 0.0–2.0)
Bicarbonate: 28 mEq/L — ABNORMAL HIGH (ref 20.0–24.0)
O2 Saturation: 97 %
Patient temperature: 98.6
TCO2: 29 mmol/L (ref 0–100)
pCO2 arterial: 42.1 mmHg (ref 35.0–45.0)
pH, Arterial: 7.431 (ref 7.350–7.450)
pO2, Arterial: 85 mmHg (ref 80.0–100.0)

## 2010-06-21 LAB — COMPREHENSIVE METABOLIC PANEL
ALT: 37 U/L (ref 0–53)
ALT: 49 U/L (ref 0–53)
ALT: 27 U/L (ref 0–53)
AST: 32 U/L (ref 0–37)
AST: 43 U/L — ABNORMAL HIGH (ref 0–37)
Albumin: 3.3 g/dL — ABNORMAL LOW (ref 3.5–5.2)
Albumin: 4.4 g/dL (ref 3.5–5.2)
Alkaline Phosphatase: 58 U/L (ref 39–117)
Alkaline Phosphatase: 69 U/L (ref 39–117)
Alkaline Phosphatase: 68 U/L (ref 39–117)
BUN: 11 mg/dL (ref 6–23)
BUN: 7 mg/dL (ref 6–23)
BUN: 10 mg/dL (ref 6–23)
CO2: 30 mEq/L (ref 19–32)
CO2: 30 mEq/L (ref 19–32)
CO2: 29 mEq/L (ref 19–32)
Calcium: 8.5 mg/dL (ref 8.4–10.5)
Calcium: 9.7 mg/dL (ref 8.4–10.5)
Chloride: 102 mEq/L (ref 96–112)
Chloride: 97 mEq/L (ref 96–112)
Chloride: 105 mEq/L (ref 96–112)
Creatinine, Ser: 0.92 mg/dL (ref 0.4–1.5)
Creatinine, Ser: 1.01 mg/dL (ref 0.4–1.5)
GFR calc Af Amer: >60 mL/min (ref >60)
GFR calc Af Amer: >60 mL/min (ref >60)
GFR calc non Af Amer: >60 mL/min (ref >60)
GFR calc non Af Amer: >60 mL/min (ref >60)
GFR calc non Af Amer: >60 mL/min (ref >60)
Glucose, Bld: 101 mg/dL — ABNORMAL HIGH (ref 70–99)
Glucose, Bld: 136 mg/dL — ABNORMAL HIGH (ref 70–99)
Glucose, Bld: 93 mg/dL (ref 70–99)
Potassium: 4.5 mEq/L (ref 3.5–5.1)
Potassium: 4.6 mEq/L (ref 3.5–5.1)
Potassium: 4.5 mEq/L (ref 3.5–5.1)
Sodium: 132 mEq/L — ABNORMAL LOW (ref 135–145)
Sodium: 137 mEq/L (ref 135–145)
Sodium: 141 mEq/L (ref 135–145)
Total Bilirubin: 1.1 mg/dL (ref 0.3–1.2)
Total Bilirubin: 1.8 mg/dL — ABNORMAL HIGH (ref 0.3–1.2)
Total Bilirubin: 1.2 mg/dL (ref 0.3–1.2)
Total Protein: 5.9 g/dL — ABNORMAL LOW (ref 6.0–8.3)
Total Protein: 6.7 g/dL (ref 6.0–8.3)

## 2010-06-21 LAB — CBC
HCT: 40.7 % (ref 39.0–52.0)
HCT: 41.4 % (ref 39.0–52.0)
HCT: 46.1 % (ref 39.0–52.0)
HCT: 47.7 % (ref 39.0–52.0)
Hemoglobin: 14 g/dL (ref 13.0–17.0)
Hemoglobin: 14.2 g/dL (ref 13.0–17.0)
Hemoglobin: 16 g/dL (ref 13.0–17.0)
Hemoglobin: 16.5 g/dL (ref 13.0–17.0)
MCH: 33 pg (ref 26.0–34.0)
MCH: 33 pg (ref 26.0–34.0)
MCH: 33 pg (ref 26.0–34.0)
MCHC: 34.4 g/dL (ref 30.0–36.0)
MCHC: 34.4 g/dL (ref 30.0–36.0)
MCHC: 34.7 g/dL (ref 30.0–36.0)
MCV: 95.1 fL (ref 78.0–100.0)
MCV: 95.8 fL (ref 78.0–100.0)
MCV: 96.1 fL (ref 78.0–100.0)
MCV: 95.8 fL (ref 78.0–100.0)
Platelets: 146 10*3/uL — ABNORMAL LOW (ref 150–400)
Platelets: 148 10*3/uL — ABNORMAL LOW (ref 150–400)
Platelets: 183 10*3/uL (ref 150–400)
RBC: 4.24 MIL/uL (ref 4.22–5.81)
RBC: 4.32 MIL/uL (ref 4.22–5.81)
RBC: 4.85 MIL/uL (ref 4.22–5.81)
RDW: 11.7 % (ref 11.5–15.5)
RDW: 11.9 % (ref 11.5–15.5)
RDW: 12 % (ref 11.5–15.5)
WBC: 11.8 10*3/uL — ABNORMAL HIGH (ref 4.0–10.5)
WBC: 12 10*3/uL — ABNORMAL HIGH (ref 4.0–10.5)
WBC: 8.3 10*3/uL (ref 4.0–10.5)
WBC: 7.2 10*3/uL (ref 4.0–10.5)

## 2010-06-21 LAB — PROTIME-INR
INR: 0.92 (ref 0.00–1.49)
INR: 0.95 (ref 0.00–1.49)
Prothrombin Time: 12.3 seconds (ref 11.6–15.2)
Prothrombin Time: 12.6 seconds (ref 11.6–15.2)

## 2010-06-21 LAB — BLOOD GAS, ARTERIAL
TCO2: 24.7 mmol/L (ref 0–100)
pCO2 arterial: 35.4 mmHg (ref 35.0–45.0)
pH, Arterial: 7.44 (ref 7.350–7.450)
pO2, Arterial: 110 mmHg — ABNORMAL HIGH (ref 80.0–100.0)

## 2010-06-21 LAB — SURGICAL PCR SCREEN: Staphylococcus aureus: NEGATIVE

## 2010-06-21 LAB — APTT: aPTT: 32 seconds (ref 24–37)

## 2010-06-21 LAB — ABO/RH: ABO/RH(D): A POS

## 2010-06-21 LAB — MRSA PCR SCREENING: MRSA by PCR: NEGATIVE

## 2010-06-21 LAB — GLUCOSE, CAPILLARY: Glucose-Capillary: 102 mg/dL — ABNORMAL HIGH (ref 70–99)

## 2010-06-23 NOTE — Assessment & Plan Note (Signed)
Summary: right ear pain/men off/cd   Vital Signs:  Patient profile:   60 year old male Height:      68 inches Weight:      178.25 pounds BMI:     27.20 O2 Sat:      98 % on Room air Temp:     97.3 degrees F oral Pulse rate:   73 / minute BP sitting:   130 / 80  (left arm) Cuff size:   regular  Vitals Entered By: Zella Ball Ewing CMA Duncan Dull) (June 19, 2010 9:32 AM)  O2 Flow:  Room air  CC: Right Ear pain/RE   Primary Care Provider:  Norins  CC:  Right Ear pain/RE.  History of Present Illness: here with acute onset sinus symptoms x 2 wks with faciial pain, pressur, fever and congseiton now overall improved except for several days worsening right earache, pressure and pain now moderate, without hearing loss, vertigo, n/v, ST, cough and Pt denies CP, worsening sob, doe, wheezing, orthopnea, pnd, worsening LE edema, palps, dizziness or syncope  Pt denies new neuro symptoms such as headache, facial or extremity weakness  Pt denies polydipsia, polyuria  Overall good compliance with meds.  No recent wt loss, night sweats, loss of appetite or other constitutional symptoms   Preventive Screening-Counseling & Management      Drug Use:  no.    Problems Prior to Update: 1)  Otitis Media, Right  (ICD-382.9) 2)  Paresthesia  (ICD-782.0) 3)  Pulmonary Nodule  (ICD-518.89) 4)  Other Abnormal Blood Chemistry  (ICD-790.6) 5)  Hypogonadism, Male  (ICD-257.2) 6)  Parotid Tumor Surgery  () 7)  Tonsillectomy and Adenoidectomy, Hx of  (ICD-V45.79) 8)  Depression, Chronic  (ICD-311) 9)  Gerd  (ICD-530.81) 10)  Hx of Allergic Rhinitis  (ICD-477.9) 11)  Urolithiasis, Hx of  (ICD-V13.01) 12)  Irritable Bowel Syndrome, Hx of  (ICD-V12.79)  Medications Prior to Update: 1)  Klonopin 0.5 Mg  Tabs (Clonazepam) .... Take 1 Tablet By Mouth Two Times A Day As Needed 2)  Vitamin D 2000 Unit  Tabs (Cholecalciferol) .... Take 1 Tablet By Mouth Once A Day 3)  Fish Oil 1000 Mg  Caps (Omega-3 Fatty Acids) ....  Take 1 Tablet By Mouth Three Times A Day 4)  Multivitamins   Tabs (Multiple Vitamin) .... Take 1 Tablet By Mouth Once A Day 5)  Fexofenadine Hcl 180 Mg Tabs (Fexofenadine Hcl) .Marland Kitchen.. 1 By Mouth Once Daily For Allergy 6)  B Complex .Marland KitchenMarland Kitchen. 1 Daily 7)  Co Q10 .Marland KitchenMarland Kitchen. 1 Daily 8)  Pepto Bismol .... 2-3 Times A Day 9)  Vitamin C 500mg  .... 1 Daily 10)  Reveratrol 11)  Wellbutrin Xl 150 Mg Xr24h-Tab (Bupropion Hcl) .Marland Kitchen.. 1 By Mouth Once Daily 12)  Metronidazole 500 Mg Tabs (Metronidazole) .Marland Kitchen.. 1 Tab Three Times A Day 13)  Hyoscyamine Sulfate 0.125 Mg Tabs (Hyoscyamine Sulfate) .Marland Kitchen.. 1 Every 4 Hrs As Needed For Spasms 14)  Metanx 3-35-2 Mg Tabs (L-Methylfolate-B6-B12) .Marland Kitchen.. 1 Tablet Daily 15)  Amitriptyline Hcl 50 Mg Tabs (Amitriptyline Hcl) .Marland Kitchen.. 1 By Mouth At Bedtime For Paresthesia  Current Medications (verified): 1)  Klonopin 0.5 Mg  Tabs (Clonazepam) .... Take 1 Tablet By Mouth Two Times A Day As Needed 2)  Vitamin D 2000 Unit  Tabs (Cholecalciferol) .... Take 1 Tablet By Mouth Once A Day 3)  Fish Oil 1000 Mg  Caps (Omega-3 Fatty Acids) .... Take 1 Tablet By Mouth Three Times A Day 4)  Multivitamins   Tabs (  Multiple Vitamin) .... Take 1 Tablet By Mouth Once A Day 5)  Fexofenadine Hcl 180 Mg Tabs (Fexofenadine Hcl) .Marland Kitchen.. 1 By Mouth Once Daily For Allergy 6)  B Complex .Marland KitchenMarland Kitchen. 1 Daily 7)  Co Q10 .Marland KitchenMarland Kitchen. 1 Daily 8)  Pepto Bismol .... 2-3 Times A Day 9)  Vitamin C 500mg  .... 1 Daily 10)  Reveratrol 11)  Wellbutrin Xl 150 Mg Xr24h-Tab (Bupropion Hcl) .Marland Kitchen.. 1 By Mouth Once Daily 12)  Metronidazole 500 Mg Tabs (Metronidazole) .Marland Kitchen.. 1 Tab Three Times A Day 13)  Hyoscyamine Sulfate 0.125 Mg Tabs (Hyoscyamine Sulfate) .Marland Kitchen.. 1 Every 4 Hrs As Needed For Spasms 14)  Levofloxacin 500 Mg Tabs (Levofloxacin) .Marland Kitchen.. 1 By Mouth Once Daily 15)  Amitriptyline Hcl 50 Mg Tabs (Amitriptyline Hcl) .Marland Kitchen.. 1 By Mouth At Bedtime For Paresthesia  Allergies (verified): 1)  ! * Bee Stings 2)  Nsaids  Past History:  Past Medical  History: Last updated: 02/16/2010  PARESTHESIA (ICD-782.0) PULMONARY NODULE (ICD-518.89) OTHER ABNORMAL BLOOD CHEMISTRY (ICD-790.6) HYPOGONADISM, MALE (ICD-257.2) DEPRESSION, CHRONIC (ICD-311) GERD (ICD-530.81) Hx of ALLERGIC RHINITIS (ICD-477.9) UROLITHIASIS, HX OF (ICD-V13.01) IRRITABLE BOWEL SYNDROME, HX OF (ICD-V12.79)        Past Surgical History: Last updated: 02/16/2010 * PAROTID TUMOR SURGERY TONSILLECTOMY AND ADENOIDECTOMY, HX OF (ICD-V45.79) Minithoractomy with partial lobectomy for pulmonary carcinoid June '11 Tyrone Sage)  Social History: Last updated: 06/19/2010 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   Head:  normocephalic and atraumatic.   Eyes:  vision grossly intact, pupils equal, and pupils round.   Ears:  right TM mod erythema, slight bulging, canals clear, left tm clear, sinus nontender Nose:  nasal dischargemucosal pallor and mucosal edema.   Mouth:  pharyngeal erythema and fair dentition.   Neck:  supple and no masses.   Lungs:  normal respiratory effort and normal breath sounds.   Heart:  normal rate and regular rhythm.   Extremities:  no edema, no erythema    Impression & Recommendations:  Problem # 1:  OTITIS MEDIA, RIGHT (ICD-382.9)  His updated medication list for this problem includes:    Metronidazole 500 Mg Tabs  (Metronidazole) .Marland Kitchen... 1 tab three times a day    Levofloxacin 500 Mg Tabs (Levofloxacin) .Marland Kitchen... 1 by mouth once daily treat as above, f/u any worsening signs or symptoms   Instructed on prevention and treatment. Call if no improvement in 48-72 hours or sooner if worsening symptoms.   Complete Medication List: 1)  Klonopin 0.5 Mg Tabs (Clonazepam) .... Take 1 tablet by mouth two times a day as needed 2)  Vitamin D 2000 Unit Tabs (Cholecalciferol) .... Take 1 tablet by mouth once a day 3)  Fish Oil 1000 Mg Caps (Omega-3 fatty acids) .... Take 1 tablet by mouth three times a day 4)  Multivitamins Tabs (Multiple vitamin) .... Take 1 tablet by mouth once a day 5)  Fexofenadine Hcl 180 Mg Tabs (Fexofenadine hcl) .Marland Kitchen.. 1 by mouth once  daily for allergy 6)  B Complex  .Marland KitchenMarland Kitchen. 1 daily 7)  Co Q10  .Marland Kitchen.. 1 daily 8)  Pepto Bismol  .... 2-3 times a day 9)  Vitamin C 500mg   .... 1 daily 10)  Reveratrol  11)  Wellbutrin Xl 150 Mg Xr24h-tab (Bupropion hcl) .Marland Kitchen.. 1 by mouth once daily 12)  Metronidazole 500 Mg Tabs (Metronidazole) .Marland Kitchen.. 1 tab three times a day 13)  Hyoscyamine Sulfate 0.125 Mg Tabs (Hyoscyamine sulfate) .Marland Kitchen.. 1 every 4 hrs as needed for spasms 14)  Levofloxacin 500 Mg Tabs (Levofloxacin) .Marland Kitchen.. 1 by mouth once daily 15)  Amitriptyline Hcl 50 Mg Tabs (Amitriptyline hcl) .Marland Kitchen.. 1 by mouth at bedtime for paresthesia  Patient Instructions: 1)  Please take all new medications as prescribed - the levaquin sent to your pharmacy (generic) 2)  You can also use Mucinex OTC or it's generic for congestion , and even allegraD as well 3)  Continue all previous medications as before this visit  4)  Please schedule an appointment with your primary doctor as needed Prescriptions: LEVOFLOXACIN 500 MG TABS (LEVOFLOXACIN) 1 by mouth once daily  #10 x 0   Entered and Authorized by:   Corwin Levins MD   Signed by:   Corwin Levins MD on 06/19/2010   Method used:   Electronically to        [facility] [phone]  (retail)       [address]       [place], [place]  [address]       Ph: [phone] or [phone]       Fax: [phone]   RxID:   [id number]    Orders Added: 1)  Est. Patient Level III [76160]

## 2010-08-18 NOTE — Assessment & Plan Note (Signed)
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  He has done well postoperatively.  Today, he comes to  the office and notes his primary concern is paresthesias in his feet  when he is running.  I suspect this is unrelated to his thoracic  surgery.  His list of medications is nothing stands out as a medication  causing neuropathy.  He has done well from a thoracic surgery point of  view.  He still has some discomfort in his right chest, but has returned  to normal activities.   PHYSICAL EXAMINATION:  His blood pressure 135/85, pulse 81, respiratory  rate is 18, and O2 sats 95%.  I do not appreciate any cervical or  supraclavicular adenopathy.  His lungs are clear bilaterally.  His right  chest incisions are well healed without any evidence of herniation.   DIAGNOSTIC TESTS:  Followup chest x-ray today shows clear lung fields.   IMPRESSION:  He notes that since I last seen him, he has seen Dr.  Arbutus Ped and had recommended a CT scan of the chest and abdomen and  followup in January 2012.  I plan to see him back after this scan is  done.  Prior to his surgery, he had had issues of abdominal pain and  question of diverticular disease.  In evaluation of this, there was  suggestion of lymph node of upper limits of normal size along the  ventral aspect of the pancreatic head, thought likely to be reactive.  However, the patient was concerned about this and followup abdominal CT  scan will  be done.  Also, I plan to see him back in January or February after the  CT scan is performed.  He will discuss with Dr. Debby Bud the evaluation of  neuropathy in both feet.   Sheliah Plane, MD  Electronically Signed   EG/MEDQ   D:  02/05/2010  T:  02/06/2010  Job:  [id number]   cc:   Rosalyn Gess. Norins, MD  Si Gaul, MD

## 2010-08-18 NOTE — Assessment & Plan Note (Signed)
OFFICE VISIT   [name] Page, [name] Page  DOB:  [birth year]                                        October 28, 2009  CHART #:  [id number]   The patient returns to the office today in follow up after right mini  thoracotomy, medial basilar segmentectomy, lymph node dissection,  resection for a T1 N0 low-grade neuroendocrine carcinoid tumor 0.8 cm in  size that was resected on October 07, 2009.  Postoperatively, the patient  continues to do well.  He has some mild paresthesias over the right  chest.  Denies any shortness of breath.  He is walking up to a mile and  a half a day without shortness of breath.   On exam, his blood pressure 129/89, pulse is 70, respiratory rate is 18,  O2 sats 98%.  His lungs are clear bilaterally.  The right chest incision  is well healed.  He has no calf swelling or tenderness.   Chest x-ray was done which shows improved aeration with only very mild  basilar atelectasis and no pneumothorax or effusions.   I reviewed the findings at surgery with the patient for carcinoid tumor.  He notes that preoperatively for several years, he has had a chronic  cough which has now disappeared.  At this point, I have recommended to  him followup.  There is no additional therapy that is needed from an  oncology point of view; however, he is particularly concerned about  malignancies as his wife has also had malignancy and numerous neighbors  have had malignancies.  He would like to have the case reviewed by Dr.  Arbutus Ped which we will arrange.  I do plan to see him back in 3 months  with a followup chest x-ray.  At that time, we will determine timing of  a followup CT  scan.  I have given him a renewal prescription for tramadol 50 q.6 h.  p.r.n. 30 tablets and Vicodin 5/325 one q.6 h. p.r.n.  We discussed  returning to work about 6 weeks postop.   Sheliah Plane, MD  Electronically Signed   EG/MEDQ  D:  10/28/2009  T:  10/28/2009  Job:  [id number]   cc:    Lajuana Matte, MD  Rosalyn Gess. Norins, MD

## 2010-08-18 NOTE — Letter (Signed)
September 23, 2009   Rosalyn Gess. Norins, MD  [address]  [place], [place] [address]   Re:  [name] Page, [name] Page              DOB:  04/19/1950   Dear Kathlene November:   I saw the patient in the office today.  I appreciate the opportunity of  seeing him.  I am just sorry I have to see him under these  circumstances.  He was accompanied by his wife.  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  At that time, a chest  CT was done that showed a right lower lobe nodule that was 1.4 x 1 mm in  size.  A PET scan was positive in this area with an SUV of 4.  There  also is a question of an uptake of 2.6 in the right hilar area.  He is a  nonsmoker.  He has had some problems with a dry cough for allergies and  gets some shortness of breath with exertion.  No hemoptysis, fever,  chills, or excessive sputum.   PAST MEDICAL HISTORY:  He had a previous left parotid tumor removed,  required a nerve graft.   ALLERGIES:  He is allergic to bee stings, intolerance to NSAIDs, and  intolerance to morphine.   MEDICATIONS:  He is on:  1. Klonopin 0.5 b.i.d.  2. Vitamin D.  3. Fish oil.  4. Fexofenadine 180 mg daily.  5. Ventolin.  6. Testosterone 200 mcg daily.  7. Vitamin C.  8. Wellbutrin.  9. Augmentin.   FAMILY HISTORY:  Noncontributory.   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   REVIEW OF SYSTEMS:  VITAL SIGNS:  He is 5 feet 8 inches, 174 pounds.  GENERAL:  His weight is stable.  CARDIAC:  No angina or atrial fibrillation.  PULMONARY:  Dry cough.  GI:  Diverticulitis.  GU:  No kidney disease.  VASCULAR:  No claudication, DVT, or TIAs.  NEUROLOGICAL:  Dizziness.  MUSCULOSKELETAL:  Muscle pain and joint pain.  PSYCHIATRIC:  Depression and nervousness.  EYE/ENT:  No changes in eyesight or hearing.  HEMATOLOGIC:  No problems with bleeding, clotting disorders, or  anemia.   PHYSICAL EXAMINATION:  Vital Signs:  His blood pressure was 136/87,  pulse 77, respirations 18, and sats were 98%.  Head, Eyes, Ears, Nose,  and Throat:  Unremarkable.  Neck:  Supple without thyromegaly.  There is  no supraclavicular or axillary adenopathy.  Chest:  Clear to  auscultation and percussion.  Heart:  Regular sinus rhythm.  No murmurs.  Abdomen:  Soft.  There is no hepatosplenomegaly.  Extremities:  Pulses  are 2+.  There is no clubbing or edema.   I have discussed with the patient and his wife about my recommendations  and I think since his PET positive that the best thing to do would  proceed with a right lower lobe lobectomy or possible right lower lobe  basilar segmentectomy with node sampling.  They will think about this.  In the meantime, we will get an MRI of the brain as well as pulmonary  function test.  I appreciate the opportunity of seeing the patient.   Sincerely,   Ines Bloomer, M.D.  Electronically Signed   DPB/MEDQ  D:  09/23/2009  T:  09/24/2009  Job:  [id number]

## 2010-08-18 NOTE — Assessment & Plan Note (Signed)
HEALTHCARE                         GASTROENTEROLOGY OFFICE NOTE   NAME:Bothun, RAYNOLD BLANKENBAKER                     MRN:          [id number]  DATE:05/15/2007                            DOB:          11-Apr-1950    The patient is a 60 year old white male.  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  He is  currently working for [employer]. Corliss Skains, M.D. in interventional  radiology at [facility]  I saw him for the last time for  colonoscopy in 2002.  He had mild diverticulosis of the left colon and  first grade hemorrhoids.  He is now complaining of three-week history of  diarrhea.  It started after 7-day course of amoxicillin 500 mg four  times a day after a root canal.  He was also taking some Naprosyn 375 mg  twice a day for several days.  Root canal appointment was on January 13.  The diarrhea started about two weeks with loose stools to watery stools,  but no nocturnal diarrhea.  They were associated with crampy abdominal  pain.  There was initially some nausea, but no vomiting and no fever.  The patient saw Dr. Debby Bud last week who put him on Flagyl 500 mg p.o.  q.i.d. for 6 days.  He has been quite nauseated taking the Flagyl.   MEDICATIONS:  1. AndroGel cream 5 grams daily.  2. Klonopin 0.5 mg b.i.d.  3. Flagyl 500 mg q.i.d.  4. Multivitamin.  5. Omeprazole 40 mg daily.   PAST MEDICAL HISTORY:  Significant for IBS, kidney stones 20 years ago,  orthopedic problems, anxiety.   PAST SURGICAL HISTORY:  Parotid gland surgery in July of 1998, heart  catheterization in July of 2006 which was normal.   FAMILY HISTORY:  Positive for colon cancer in an uncle, hypertension in  both parents, diabetes in both parents.   SOCIAL HISTORY:  Married with no children.  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  He does not smoke and drinks alcohol only rarely.   REVIEW OF SYSTEMS:  Positive for eye  glasses, allergies, arthritic  complaints, sleeping problems, severe fatigue, muscle pains.   PHYSICAL EXAMINATION:  VITAL SIGNS:  Blood pressure 122/78, pulse 68,  and weight 181 pounds.  GENERAL:  He was alert and oriented in no acute distress.  LUNGS:  Clear to auscultation.  HEART:  Normal S1 and normal S2.  ABDOMEN:  Soft with diffuse mild tenderness throughout the entire  abdomen with somewhat hyperactive low grade bowel sounds.  No abnormal  rushes.  No tympany.  Epigastrium was also somewhat tender.  RECTAL:  Soft.  Hemoccult negative stool.  EXTREMITIES:  No edema.   IMPRESSION:  A 60 year old white male with somewhat acute onset of  diarrhea illness following course of antibiotics and NSAIDS.  Diarrhea  so far has not responded to high dose Flagyl 500 mg q.i.d. for the past  six days.  His Clostridium difficile toxin today was negative, but I  still feel that we are dealing with antibiotic associated diarrhea  despite being C. difficile negative.  For that reason, I will put him on  Align one p.o. daily, lower the dose of Flagyl to 250 mg t.i.d. because  of the gastric intolerance to the high doses.  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   We will put him on low residue diet.  Give Cipro 250 p.o. b.i.d. and  this is to rule out possibility that he may have salmonella  gastroenteritis.  He will give me a call by the end of this week if the  diarrhea continues.  Consider other options such as flexible  sigmoidoscopy, stool culture, etc.     Hedwig Morton. Juanda Chance, MD  Electronically Signed    DMB/MedQ  DD: 05/15/2007  DT: 05/16/2007  Job #: [id number]   cc:   Rosalyn Gess. Norins, MD

## 2010-08-18 NOTE — Assessment & Plan Note (Signed)
OFFICE VISIT   Page, [name] L  DOB:  15-Jul-1950                                        September 29, 2009  CHART #:  [id number]   HISTORY:  The patient comes to the office today.  He had originally been  scheduled for right lower lobectomy by Dr. Edwyna Shell, but 2 events occurred  which have changed this, the first is that Dr. Edwyna Shell injured his  shoulder and was unable to operate on the patient.  In addition, the  patient called his medical doctor today because of increasing left lower  quadrant abdominal pain.  He has had several bouts of diverticulitis.  This complaint is what originally led to the discovery of the right  lower lobe lung nodule.  He denies any fever or chills.  He notes that  when he was checked in his medical doctor's office, he had no elevated  white count.  However, he notes that he has now had 2 courses of  antibiotics, Augmentin and Flagyl with improvement in his left lower  quadrant pain.  However, he stopped the antibiotics last Wednesday and  had return of pain this morning.   PHYSICAL EXAMINATION:  His blood pressure 137/88, pulse 79, respiratory  rate is 18, O2 sats 94%, and temperature is 97.8.  The patient looks  well, in no obvious distress.  However on exam, he has clear lung fields  without wheezing.  Cardiac exam reveals regular rate and rhythm without  murmur or gallop.  On exam of the abdomen, he does have positive bowel  sounds and has point tenderness over the left lower quadrant without  rebound.   IMPRESSION:  With Dr. Scheryl Darter absence, I had seen the patient in regard  to proceeding with his planned surgery.  I have reviewed his MRI, showed  no evidence of disease in the brain.  I have reviewed his CTs.  It is  interesting and note that on the renal phase of the CT scan of the  abdomen in March 2009, there was a suggestion of right lower lobe  nodule.  It appears about the same size as the current nodule in  question.   Unfortunately there was a PET-avid area in the right lower  lobe of SUV of 4, raising the suspicion of possible malignancy.  On  reviewing the scans, it is in a very difficult location to biopsy.  It  is preferably locate and do a biopsy with EBUS, but in a poor location  to do a percutaneous needle biopsy.  I have reviewed all this with him  and  delayed surgery and I will plan to see him back later this week and when  his symptoms of diverticulitis have resolved, then we will proceed with  originally planned surgery.   Sheliah Plane, MD  Electronically Signed   EG/MEDQ  D:  09/29/2009  T:  09/30/2009  Job:  [id number]   cc:   Rosalyn Gess. Norins, MD

## 2010-08-21 NOTE — Assessment & Plan Note (Signed)
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  The patient was seen a year ago in  2006 when he presented to the emergency room with substernal chest pain.  There was a concern that he had developed unstable angina and underwent a  cardiac catheterization.  Catheterization, however, showed no significant  coronary artery disease.  He also had normal LV function.   In the interim, [name] [name] reports that he has had frequent palpitations.  He does  report that he had palpitations for well over 10 years but they have become  more frequent.  Interestingly, also prior to his episode of chest pain a  year ago, he had a burst of palpitations, at that time was associated with  chest pain.  Over the last 6 months, he states that he has had palpitations  every day which are both at rest and on exertion, although exertion appears  to make the palpitations better.  His symptoms can last for minutes to  several hours in a day.  Sometimes in the morning, he feels more  palpitations and has difficulty getting up.  He has been seeing Dr. Corine Shelter  in [facility].  There has been some concern about his DHEA levels  and testosterone levels.  He has been taking some replacement therapy,  particularly in light of a low a.m. cortisol; but, when he took DHEA, he  actually got worse and could not tolerate the medication.  He also cut back  on his caffeine and alcohol.  He denies any chest pain, orthopnea, PND.  He  feels that he is deconditioned and he has gained a significant amount of  weight over the last several years.  He feels that he does not get out  frequently  enough to exercise.   ALLERGIES:  There are no known drug allergies.   PAST MEDICAL HISTORY:  1. History of resolved Bell's palsy.  2. History of GERD.   MEDICATIONS:  1. EPA and DHA at 3 g a day.  2. Vitamin D.  3. Vitamin K.  4. Coenzyme Q10.  5. DHEA 10 mg a day, which was discontinued.  6. Magnesium complex.  7. Chromium.  8. Rose hips.  9. Vitamin E __________ stress.  10.Zinc.  11.Folic acid.   SOCIAL HISTORY:  The patient is married, does not smoke cigarettes.  He  denies any alcohol or recreational drug use.   FAMILY HISTORY:  Noncontributory.   PAST SURGICAL HISTORY:  Include __________ tumor resection,  also history of  circumcision.   REVIEW OF SYSTEMS:  As per HPI.  No nausea or vomiting.  No fever or chills.  No abdominal pain.  No dysuria or frequency.  No melena, hematochezia.  No  definite  neurological symptoms.   PHYSICAL EXAMINATION:  VITAL SIGNS:  Blood pressure 115/78.  Heart rate 68  beats per minute.  GENERAL:  Well-nourished, white male in no apparent distress.  HEENT:  Pupils isocoric, conjunctivae clear.  NECK:  Supple.  Normal carotid upstroke.  No carotid bruits.  LUNGS:  Clear breath sounds bilaterally.  HEART:  Regular rate and rhythm.  Normal S1, S2.  No murmur, rubs, or  gallops.  ABDOMEN:  Soft, nontender.  No rebound, tenderness, or guarding.  Good bowel  sounds.  EXTREMITIES:  No cyanosis, clubbing, or edema.  NEUROLOGIC:  The patient is alert, oriented, and grossly nonfocal.   PROBLEMS:  1. Palpitations.  2. History of Bell's palsy.  3. History of chest pain with negative catheterization.   PLAN:  1. I am not sure as to the etiology of [name] [name]'s palpitations.  He could have      an arrhythmia.  We will apply cardiac monitor to rule this out.  2. [name] [name] does have an incomplete right bundle branch block on EKG, but, per      his report, prior echocardiographic study did not show any evidence of      ASD.  3. I anticipate that [name] [name]  is significantly deconditioned and we might      consider CPX testing.  4. I asked him not to take any more supplements but we can repeat his a.m.      cortisol in 6 months.       Learta Codding, MD,FACC     GED/MedQ  DD:  12/30/2005  DT:  01/01/2006  Job #:  [id number]

## 2010-08-21 NOTE — Procedures (Signed)
[facility]  Patient:    [name] [name], [name] [name] Visit Number: [id number] MRN: [id number]          Service Type: END Location: ENDO Attending Physician:  Mervin Hack Dictated by:   Hedwig Morton. Juanda Chance, M.D. [facility] Admit Date:  12/30/2000                             Procedure Report  PROCEDURE:  Colonoscopy.  INDICATIONS:  This 60 year old white male is undergoing colonoscopy for several reasons.  Recently he has been evaluated for right upper quadrant abdominal pain.  CT of the abdomen and lower chest was normal.  There is no family history of colon cancer.  The patient has developed discomfort in the rectum with some palpable nodule at the rectum.  He is being evaluated with colonoscopy to rule out polyps or neoplasm.  ENDOSCOPE:  Olympus single-channel video endoscope.  SEDATION:  Versed 6 mg IV and Demerol 70 mg IV.  FINDINGS:  The Olympus single-channel video endoscope was passed under direct vision through the rectum to the sigmoid colon.  The patient was monitored by pulse oximetry.  His oxygen saturations were excellent.  His prep was very good.  The anal canal showed first grade hemorrhoids.  These were viewed by retroflexing the colonoscope in the rectal ampulla.  There was no prolapse of the hemorrhoids, but the hemorrhoids extended into the anal canal and there was some tenderness on digital exam, as well as on introduction of the colonoscope in the rectum.  The sigmoid colon mucosa showed prominent venous pattern, but no inflammatory changes.  Haustral folds were normal.  There were no diverticuli.  The colonoscope passed easily from the descending colon to the splenic flexure, transverse colon, hepatic flexure, and ascending colon to the cecum.  The cecal pouch was reached without difficulty and showed normal mucosa, appendiceal opening, and ileocecal valve.  The colonoscope was then slowly retracted through the right and left colon.   Video photographs of the cecal pouch were obtained.  There were no polyps or diverticuli.  IMPRESSION:  First grade hemorrhoids, otherwise normal colonoscopy to the cecum.  PLAN: 1. High-fiber diet. 2. Trial of Levsin 0.125 mg p.r.n. right upper quadrant abdominal pain. 3. Anusol Cream with Cooling Gel p.r.n. hemorrhoids. 4. Repeat colonoscopy in 10 years. Dictated by:   Hedwig Morton. Juanda Chance, M.D. [facility] Attending Physician:  Mervin Hack DD:  12/30/00 TD:  12/30/00 Job: [id number]

## 2010-08-21 NOTE — Cardiovascular Report (Signed)
NAME:  [name] [name], AMRHEIN NO.:  [id number]   MEDICAL RECORD NO.:  [id number]          PATIENT TYPE:  EMS   LOCATION:  [facility]                         FACILITY:  [facility]   PHYSICIAN:  Salvadore Farber, M.D. LHCDATE OF BIRTH:  1950-10-23   DATE OF PROCEDURE:  10/08/2004  DATE OF DISCHARGE:                              CARDIAC CATHETERIZATION   PROCEDURE:  Left heart cath showed catheterization, left ventriculography,  coronary angiography, aortic root angiography.   INDICATION:  [name] is a 60 year old gentleman without prior history  of cardiovascular disease.  He awoke at 2 o'clock this morning with  substernal chest pressure radiating to his right jaw.  The pain became as  severe as 7/10.  There was no associated dyspnea or radiation to the back.  He presented with an antecedent history of several weeks of mild exertional  dyspnea.  He arrived in the emergency room where electrocardiogram  demonstrated incomplete right bundle branch block with approximately 0.5 mm  of ST depression in the inferior leads and T-wave inversions in the inferior  leads.  He had ongoing 3/10 chest discomfort.  This discomfort persisted  despite treatment with aspirin, heparin, and a GI cocktail.  He was then  transferred urgently to the cardiac catheterization lab for definitive  exclusion of coronary disease as the etiology of his chest discomfort.   PROCEDURAL TECHNIQUE:  Informed consent was obtained.  Underwent 1%  lidocaine local anesthesia, a 6-French sheath was placed in the right common  femoral artery using the modified Seldinger technique.  Diagnostic  angiography and ventriculography were performed using JL-4, JR-4, and  pigtail catheters.  The pigtail catheter was then pulled back to the aortic  root.  Arch aortography was performed by power injection. The catheters were  then removed over a wire and he was then transferred to the holding room in  stable condition.   COMPLICATIONS:  None.   FINDINGS:  1.  LV:  129/2/16.  EF 65% without regional wall motion abnormality.  2.  No aortic stenosis or mitral regurgitation.  3.  Left main:  Long vessel.  It is angiographically normal.  4.  LAD:  Moderate sized vessel giving rise to a single moderate-sized      diagonal.  It is angiographically normal.  5.  Ramus intermedius:  Large vessel which is angiographically normal.  6.  Circumflex:  Moderate-sized dominant vessel.  It gives rise to a single      obtuse marginal and a PDA. It is angiographically normal.  7.  RCA:  Relatively small, codominant vessel.  It is angiographically      normal.  8.  Aortic root:  No aneurysm, dissection, or aortic insufficiency.   IMPRESSION/PLAN:  The patient has angiographically normal coronary arteries  and normal left ventricular size and systolic function.  There is no  evidence of aortic dissection or aneurysm.  We will check D-dimer to exclude  pulmonary embolism though clinical probability is very low.       WED/MEDQ  D:  10/08/2004  T:  10/08/2004  Job:  [id number]

## 2010-08-21 NOTE — H&P (Signed)
NAME:  DAMARION, MENDIZABAL.:  [id number]   MEDICAL RECORD NO.:  [id number]          PATIENT TYPE:  EMS   LOCATION:  [facility]                         FACILITY:  [facility]   PHYSICIAN:  Duke Salvia, M.D.  DATE OF BIRTH:  1951-03-05   DATE OF ADMISSION:  10/08/2004  DATE OF DISCHARGE:                                HISTORY & PHYSICAL   HISTORY OF PRESENT ILLNESS:  Phelan Schadt is well-known to [place] personally,  having worked for [place] for years.  He presents to the emergency room tonight,  having been awakened at 2 o'clock this morning with the abrupt onset of  right jaw pain that extended subsequently into his neck and into his mid-  sternum.  It was associated with some diaphoresis, but no dyspnea and no  nausea.  It persisted for some hours, prompting him to come to the emergency  room.  He currently rates it at 7 or 8/10.   The patient's past cardiac history is notable only for palpitations which  monitoring demonstrated to be PACs and PVCs.  He had an echocardiogram that  was normal and a standard stress test that was normal in the remote past  (greater than 5 years).   His cardiac risk factors are notable for a modest dyslipidemia with a low  HDL.  There is no hypertension or diabetes, no cigarette use and his family  history is negative.   He does have a history of GE reflux disease; in his mind, this is distinct  from that.   This evening, prior to having developed these symptoms, he went to a dinner  and had a large meal.  He also had been stung by a bunch of bees a few days  ago and was on a prednisone Dosepak.   He has also noted increasing exercise intolerance which has attributed to  lack of conditioning.  His new job has been associated with a significant  amount of stress.   PAST MEDICAL HISTORY:  His past medical history is notable primarily as  above.  He does have a resolved Bell's palsy.   PAST SURGICAL HISTORY:  Past surgical history is notable  for parotid tumor  resection -- that was the precipitating cause of the Bell's palsy -- and  circumcision.   MEDICATIONS:  He takes no medications except for p.r.n. Klonopin.   ALLERGIES:  He has no drug allergies.   SOCIAL HISTORY:  He is married, does not use cigarettes, alcohol or  recreational drugs.   PHYSICAL EXAMINATION:  GENERAL:  He is a well-developed, well-nourished,  middle-aged Caucasian male in no acute distress, feeling somewhat better  right now.  VITAL SIGNS:  His blood pressure is 122/74, his pulse is 80.  HEENT:  Exam demonstrates no icterus or xanthomata.  NECK:  The neck veins were flat.  The carotids were brisk and full  bilaterally without bruits.  The parotid scar was well-healed.  BACK:  The back was without kyphosis or scoliosis.  LUNGS:  Clear.  HEART:  Heart sounds were regular without murmurs or gallops.  ABDOMEN:  Soft with  active bowel sounds, without abnormal pulsation or  hepatomegaly.  EXTREMITIES:  Femoral pulses were 2+.  Distal pulses were intact.  There was  no clubbing, cyanosis, or edema.  NEUROLOGICAL:  Exam was grossly normal.  SKIN:  Warm and dry.   LABORATORY AND ACCESSORY CLINICAL DATA:  Electrocardiogram obtained this  morning demonstrated sinus rhythm at 79 with intervals of 0.18, 0.1 and 0.37  with an axis of 45 degrees.  There is ST segment depression in leads III, F,  V3.  The previously obtained electrocardiogram at 5 o'clock in the morning  was not significantly different.   IMPRESSION:  1.  Chest pain consistent with angina.  2.  Abnormal ST segments in the inferolateral leads as well as in V3.  3.  Cardiac risk factors notable for a mild dyslipidemia, but otherwise      negative for hypertension, diabetes, family history or cigarettes.  4.  Recent history of bee stings.  5.  Recent increased dyspnea on exertion with increased stress.  6.  History of gastroesophageal reflux disease.  7.  Remote history of palpitations with  monitoring demonstrating premature      ventricular contractions and premature atrial contractions.   Mr. Carriker has chest pain that is concerning for angina.  While it may be  gastroesophageal reflux, that remains to be determined.  With the fact that  he is having ongoing chest pain, diagnostic alacrity is in order.  We will  continue to work on trying to get rid of the chest pain to allow for  noninvasive strategy; hopefully, that will be effective.   PLAN:  In the interim, we will:  1.  Aspirin -- given.  2.  Lopressor and heparin.  3.  Nitroglycerin.  4.  We will try and get his old ECGs.  5.  GI cocktail.       SCK/MEDQ  D:  10/08/2004  T:  10/08/2004  Job:  [id number]   cc:   Stacie Glaze, M.D. [facility]

## 2010-08-24 ENCOUNTER — Other Ambulatory Visit (INDEPENDENT_AMBULATORY_CARE_PROVIDER_SITE_OTHER): Payer: PRIVATE HEALTH INSURANCE

## 2010-08-24 ENCOUNTER — Ambulatory Visit (INDEPENDENT_AMBULATORY_CARE_PROVIDER_SITE_OTHER): Payer: PRIVATE HEALTH INSURANCE | Admitting: Internal Medicine

## 2010-08-24 DIAGNOSIS — Z0000 Encounter for general adult medical examination without abnormal findings: Secondary | ICD-10-CM

## 2010-08-24 DIAGNOSIS — K219 Gastro-esophageal reflux disease without esophagitis: Secondary | ICD-10-CM

## 2010-08-24 DIAGNOSIS — Z125 Encounter for screening for malignant neoplasm of prostate: Secondary | ICD-10-CM

## 2010-08-24 DIAGNOSIS — R5383 Other fatigue: Secondary | ICD-10-CM

## 2010-08-24 DIAGNOSIS — R5381 Other malaise: Secondary | ICD-10-CM

## 2010-08-24 DIAGNOSIS — E291 Testicular hypofunction: Secondary | ICD-10-CM

## 2010-08-24 DIAGNOSIS — R209 Unspecified disturbances of skin sensation: Secondary | ICD-10-CM

## 2010-08-24 DIAGNOSIS — Z23 Encounter for immunization: Secondary | ICD-10-CM

## 2010-08-24 LAB — COMPREHENSIVE METABOLIC PANEL WITH GFR
ALT: 22 U/L (ref 0–53)
AST: 21 U/L (ref 0–37)
Albumin: 3.8 g/dL (ref 3.5–5.2)
Alkaline Phosphatase: 65 U/L (ref 39–117)
BUN: 16 mg/dL (ref 6–23)
CO2: 29 meq/L (ref 19–32)
Calcium: 9.4 mg/dL (ref 8.4–10.5)
Chloride: 102 meq/L (ref 96–112)
Creatinine, Ser: 1 mg/dL (ref 0.4–1.5)
GFR: 81.88 mL/min
Glucose, Bld: 105 mg/dL — ABNORMAL HIGH (ref 70–99)
Potassium: 4.1 meq/L (ref 3.5–5.1)
Sodium: 139 meq/L (ref 135–145)
Total Bilirubin: 0.8 mg/dL (ref 0.3–1.2)
Total Protein: 6.2 g/dL (ref 6.0–8.3)

## 2010-08-24 LAB — LIPID PANEL
Cholesterol: 165 mg/dL (ref 0–200)
HDL: 53.7 mg/dL
LDL Cholesterol: 85 mg/dL (ref 0–99)
Total CHOL/HDL Ratio: 3
Triglycerides: 134 mg/dL (ref 0.0–149.0)
VLDL: 26.8 mg/dL (ref 0.0–40.0)

## 2010-08-24 LAB — CBC WITH DIFFERENTIAL/PLATELET
Basophils Absolute: 0 10*3/uL (ref 0.0–0.1)
Basophils Relative: 0.6 % (ref 0.0–3.0)
Eosinophils Relative: 3.1 % (ref 0.0–5.0)
HCT: 45.1 % (ref 39.0–52.0)
Hemoglobin: 15.6 g/dL (ref 13.0–17.0)
Lymphs Abs: 2.3 10*3/uL (ref 0.7–4.0)
Monocytes Relative: 9.3 % (ref 3.0–12.0)
Neutro Abs: 4.1 10*3/uL (ref 1.4–7.7)
RDW: 12 % (ref 11.5–14.6)

## 2010-08-24 LAB — HEPATIC FUNCTION PANEL
ALT: 22 U/L (ref 0–53)
AST: 21 U/L (ref 0–37)
Albumin: 3.8 g/dL (ref 3.5–5.2)
Alkaline Phosphatase: 65 U/L (ref 39–117)
Total Bilirubin: 0.8 mg/dL (ref 0.3–1.2)
Total Protein: 6.2 g/dL (ref 6.0–8.3)

## 2010-08-24 LAB — VITAMIN B12: Vitamin B-12: 720 pg/mL (ref 211–911)

## 2010-08-24 MED ORDER — ZOSTER VACCINE LIVE 19400 UNT/0.65ML ~~LOC~~ SOLR
0.6500 mL | Freq: Once | SUBCUTANEOUS | Status: AC
  Administered 2010-08-24: 19400 [IU] via SUBCUTANEOUS

## 2010-08-24 MED ORDER — CLONAZEPAM 0.5 MG PO TABS: 0.5000 mg | ORAL_TABLET | Freq: Two times a day (BID) | ORAL | Status: DC | PRN

## 2010-08-24 MED ORDER — METANX 3-35-2 MG PO TABS: 1.0000 | ORAL_TABLET | Freq: Two times a day (BID) | ORAL | Status: DC

## 2010-08-24 MED ORDER — BUPROPION HCL ER (XL) 150 MG PO TB24: 150.0000 mg | ORAL_TABLET | Freq: Every day | ORAL | Status: DC

## 2010-08-25 LAB — TESTOSTERONE, FREE, TOTAL, SHBG
Sex Hormone Binding: 47 nmol/L (ref 13–71)
Testosterone, Free: 32.5 pg/mL — ABNORMAL LOW (ref 47.0–244.0)
Testosterone: 213.58 ng/dL — ABNORMAL LOW (ref 250–890)

## 2010-08-25 NOTE — Progress Notes (Signed)
Subjective:    Patient ID: [name] Page, male    DOB: 1950-09-12, 60 y.o.   MRN: [id number]  HPI [name] Page presents with concern for a lesion in the left ear. This has been present for several years. He has had it seen by Carma Leaven, PA who felt it was a benign lesion. Recently he notices that the lesion seems larger. He also has had some bleeding from the lesion, possibly due to excoriation. There is no pain associated with the lesion.   He reports that he is low on energy. He is not sure if this is related to inadequate treatment of his anxiety/depression or if there is a metabolic cause or if his testosterone replacement is sub-par.   PMH, FamHx and SocHx reviewed for any changes and relevance.    Review of Systems Review of Systems  Constitutional:  Negative for fever, chills, activity change and unexpected weight change.  HENT:  Negative for hearing loss, ear pain, congestion, neck stiffness and postnasal drip.   Eyes: Negative for pain, discharge and visual disturbance.  Respiratory: Negative for chest tightness and wheezing.   Cardiovascular: Negative for chest pain and palpitations.       [No decreased exercise tolerance Gastrointestinal: [No change in bowel habit. No bloating or gas. No reflux or indigestion Genitourinary: Negative for urgency, frequency, flank pain and difficulty urinating.  Musculoskeletal: Negative for myalgias, back pain, arthralgias and gait problem.  Neurological: Negative for dizziness, tremors, weakness and headaches.  Hematological: Negative for adenopathy.  Psychiatric/Behavioral: Negative for behavioral problems and dysphoric mood.        Objective:   Physical Exam Vitals reviewed Gen-l WNWD WM looking younger than his chronologic age. HEENT - just in the inner part of the pinnae is a flesh colored, raised lesion with an irregular border, signs of excoriation and with a hand lens there are tiny vessels across the surface. Post-operative changes  in the region of left parotid. Chest - clear Cor - RRR       Assessment & Plan:  1. 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   Plan - keep appointment in several weeks with Dr. Danella Deis. If she concurs I suspect he will be referred to skin surgery center for excision.   2. Anxiety/depression - he is currently on Wellbutrin XL 150 q AM.  Plan - advance to 300mg  q AM - standard therapeutic dose.  3. Malaise and fatigue - Not feeling as good as he should  Plan - routine lab: CBC, Metabolic, B12, TSH to rule underlying metabolic cause.  4. Hypogonadism - he is on replacement.  Plan - testosterone level.  5. Vitamin D - he is taking 5000 iu D3 daily. He is advised that a Vitamin D level is not needed  6. Peripheral neuropathy - elavil was too sedating. He has been taking Metanx which seems to help.  Plan - Rx for metanx bid.    Lab Results  Component Value Date   WBC 7.4 08/24/2010   HGB 15.6 08/24/2010   HCT 45.1 08/24/2010   PLT 218.0 08/24/2010   CHOL 165 08/24/2010   TRIG 134.0 08/24/2010   HDL 53.70 08/24/2010   ALT 22 08/24/2010   ALT 22 08/24/2010   AST 21 08/24/2010   AST 21 08/24/2010   NA 139 08/24/2010   K 4.1 08/24/2010   CL 102 08/24/2010   CREATININE 1.0 08/24/2010   BUN 16 08/24/2010  CO2 29 08/24/2010   TSH 1.60 08/24/2010   PSA 0.18 08/24/2010   INR 0.92 10/03/2009   HGBA1C 5.5 02/16/2010   Lab Results  Component Value Date   LDLCALC 85 08/24/2010   Lab Results  Component Value Date   TESTOSTERONE 213.58* 08/24/2010

## 2010-08-28 ENCOUNTER — Encounter: Payer: Self-pay | Admitting: Internal Medicine

## 2010-09-14 ENCOUNTER — Telehealth: Payer: Self-pay | Admitting: *Deleted

## 2010-09-14 ENCOUNTER — Other Ambulatory Visit: Payer: Self-pay | Admitting: *Deleted

## 2010-09-14 MED ORDER — BUPROPION HCL ER (XL) 300 MG PO TB24: 300.0000 mg | ORAL_TABLET | Freq: Every day | ORAL | Status: DC

## 2010-09-14 NOTE — Telephone Encounter (Signed)
Pt needs RF of welbutrin b/c MD increased dose. Per OV notes start welbutrin 300 mg q am. I updated med list and sent in RF to pharm.

## 2010-09-29 ENCOUNTER — Encounter: Payer: Self-pay | Admitting: Internal Medicine

## 2010-09-30 ENCOUNTER — Ambulatory Visit (INDEPENDENT_AMBULATORY_CARE_PROVIDER_SITE_OTHER): Payer: PRIVATE HEALTH INSURANCE | Admitting: Internal Medicine

## 2010-09-30 DIAGNOSIS — E291 Testicular hypofunction: Secondary | ICD-10-CM

## 2010-09-30 DIAGNOSIS — F329 Major depressive disorder, single episode, unspecified: Secondary | ICD-10-CM

## 2010-09-30 DIAGNOSIS — R209 Unspecified disturbances of skin sensation: Secondary | ICD-10-CM

## 2010-09-30 DIAGNOSIS — F3289 Other specified depressive episodes: Secondary | ICD-10-CM

## 2010-09-30 DIAGNOSIS — K6289 Other specified diseases of anus and rectum: Secondary | ICD-10-CM

## 2010-09-30 MED ORDER — HYDROCODONE-ACETAMINOPHEN 5-325 MG PO TABS: 1.0000 | ORAL_TABLET | Freq: Four times a day (QID) | ORAL | Status: AC | PRN

## 2010-09-30 MED ORDER — DIAZEPAM 2 MG PO TABS: 2.0000 mg | ORAL_TABLET | Freq: Four times a day (QID) | ORAL | Status: AC | PRN

## 2010-09-30 MED ORDER — EPINEPHRINE 0.3 MG/0.3ML IJ DEVI: 0.3000 mg | Freq: Once | INTRAMUSCULAR | Status: DC

## 2010-10-01 ENCOUNTER — Encounter: Payer: Self-pay | Admitting: Internal Medicine

## 2010-10-01 DIAGNOSIS — K6289 Other specified diseases of anus and rectum: Secondary | ICD-10-CM | POA: Insufficient documentation

## 2010-10-01 NOTE — Assessment & Plan Note (Signed)
Persistent discomfort.  Plan - trial of gabapentin: 100mg  qd x 3, bid x 3, tid x 3 then 200mg  tid and reassess           norco 5/325 q6 prn

## 2010-10-01 NOTE — Assessment & Plan Note (Signed)
Patient with intermittent pain that is severe.  Plan - diazepam 2mg  prn.

## 2010-10-01 NOTE — Progress Notes (Signed)
  Subjective:    Patient ID: [name] Page, male    DOB: 1951/03/16, 60 y.o.   MRN: [id number]  HPI [name] Page presents for follow-up of depression on increased dose of Wellbutrin. He reports that he can see a definite improvement, although he will still have periods when he is dysphoric. He attributes this to many changes/stressors in his life. He has had counseling in the past with Dr. Dellia Cloud which he did not find particularly helpful.  Additional concers: he has a chronic peripheral neuropathy which is uncomfortable and he is interested in treatment. He has a h/o intermittent pain in the rectal area that is disabling,lasting up to an hour that is intermittent, usually occuring at night but can occur during the day. He has seen Dr. Lina Sar in regard to this and was offered levsin which did not help. He does find that low dose diazepam will lessen the pain and hasten resolution of discomfort.  He does have some generalized symptoms that are consistent with hypogonadism and has a low testosterone level.  However, his oncologist has recommended he not take testosterone due to history of a carcinoid lung nodule.  Past Medical History  Diagnosis Date  . Disturbance of skin sensation   . Other diseases of lung, not elsewhere classified   . Other abnormal blood chemistry   . Other testicular hypofunction   . Depressive disorder, not elsewhere classified   . Esophageal reflux   . Allergic rhinitis, cause unspecified   . Personal history of urinary calculi   . Personal history of other diseases of digestive system    Past Surgical History  Procedure Date  . Parotid tumor surgery   . Tonsillectomy and adenoidectomy   . 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 History   Social History  . Marital Status: Married    Spouse Name: N/A    Number of Children: 0  . Years of Education: 16   Occupational History  .  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     Social History Main Topics  . Smoking status: Never Smoker   . Smokeless tobacco: Never Used  . Alcohol Use: Yes  . Drug Use: No  . Sexually Active: Yes -- Male partner(s)   Other Topics Concern  . Not on file   Social History Narrative   [employer], College grad, [employer]. 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 Married 30 + years. No children, lots of critters.         Review of Systems 5 system review is negative    Objective:   Physical Exam Vitals noted. Gen'l WNWD white male in no distress Pul - normal respirations Cor- RRR Neuro - alert, oriented and non-focal Psych- calm, normal affect.       Assessment & Plan:

## 2010-10-01 NOTE — Assessment & Plan Note (Signed)
Although he may have symptoms c/w this diagnosis he is not a candidate for testosterone replacement due to h/o carcinoid tumor

## 2010-10-01 NOTE — Assessment & Plan Note (Signed)
Improved on wellbutrin XL 300mg  daily. No sleep disturbance or other side effects.  Plan - continue present medication           asked him to consider another try at counseling

## 2010-10-16 ENCOUNTER — Telehealth: Payer: Self-pay | Admitting: *Deleted

## 2010-10-16 ENCOUNTER — Ambulatory Visit: Payer: PRIVATE HEALTH INSURANCE | Admitting: *Deleted

## 2010-10-16 ENCOUNTER — Other Ambulatory Visit (INDEPENDENT_AMBULATORY_CARE_PROVIDER_SITE_OTHER): Payer: PRIVATE HEALTH INSURANCE

## 2010-10-16 ENCOUNTER — Other Ambulatory Visit: Payer: Self-pay | Admitting: Internal Medicine

## 2010-10-16 ENCOUNTER — Ambulatory Visit
Admission: RE | Admit: 2010-10-16 | Discharge: 2010-10-16 | Disposition: A | Discharge status: Home / Self Care | Payer: PRIVATE HEALTH INSURANCE | Source: Ambulatory Visit | Attending: Internal Medicine | Admitting: Internal Medicine

## 2010-10-16 DIAGNOSIS — J984 Other disorders of lung: Secondary | ICD-10-CM

## 2010-10-16 DIAGNOSIS — R079 Chest pain, unspecified: Secondary | ICD-10-CM

## 2010-10-16 LAB — CARDIAC PANEL
CK-MB: 3.3 ng/mL (ref 0.3–4.0)
Relative Index: 4.3 calc — ABNORMAL HIGH (ref 0.0–2.5)
Total CK: 77 U/L (ref 7–232)

## 2010-10-16 MED ORDER — NITROGLYCERIN 0.4 MG SL SUBL: 0.4000 mg | SUBLINGUAL_TABLET | SUBLINGUAL | Status: DC | PRN

## 2010-10-16 MED ORDER — IOHEXOL 300 MG/ML  SOLN
75.0000 mL | Freq: Once | INTRAMUSCULAR | Status: AC | PRN
  Administered 2010-10-16: 75 mL via INTRAVENOUS

## 2010-10-16 NOTE — Telephone Encounter (Signed)
medication

## 2010-10-18 ENCOUNTER — Telehealth: Payer: Self-pay | Admitting: Internal Medicine

## 2010-10-18 NOTE — Telephone Encounter (Signed)
Called and reviewed cardiac labs - normal, EKG - normal, CT chest - no changes. For continued dyspepsia he should see Dr. Juanda Chance.

## 2010-10-21 LAB — LACTATE DEHYDROGENASE, ISOENZYMES
LD1/LD2 Ratio: 0.64
LDH 2: 33 % (ref 30–43)
LDH 3: 21 % (ref 16–26)
LDH 4: 10 % (ref 3–12)
LDH 5: 15 % — ABNORMAL HIGH (ref 3–14)

## 2010-11-26 ENCOUNTER — Other Ambulatory Visit: Payer: Self-pay | Admitting: Internal Medicine

## 2010-11-26 ENCOUNTER — Encounter (HOSPITAL_BASED_OUTPATIENT_CLINIC_OR_DEPARTMENT_OTHER): Payer: PRIVATE HEALTH INSURANCE | Admitting: Internal Medicine

## 2010-11-26 DIAGNOSIS — Z7982 Long term (current) use of aspirin: Secondary | ICD-10-CM

## 2010-11-26 DIAGNOSIS — F341 Dysthymic disorder: Secondary | ICD-10-CM

## 2010-11-26 DIAGNOSIS — C3490 Malignant neoplasm of unspecified part of unspecified bronchus or lung: Secondary | ICD-10-CM

## 2010-11-26 DIAGNOSIS — C7A090 Malignant carcinoid tumor of the bronchus and lung: Secondary | ICD-10-CM

## 2010-11-26 LAB — CBC WITH DIFFERENTIAL/PLATELET
Basophils Absolute: 0 10*3/uL (ref 0.0–0.1)
Eosinophils Absolute: 0.2 10*3/uL (ref 0.0–0.5)
HGB: 16.6 g/dL (ref 13.0–17.1)
LYMPH%: 24.3 % (ref 14.0–49.0)
MCV: 94.7 fL (ref 79.3–98.0)
MONO#: 0.8 10*3/uL (ref 0.1–0.9)
MONO%: 11.6 % (ref 0.0–14.0)
NEUT#: 4.3 10*3/uL (ref 1.5–6.5)
Platelets: 224 10*3/uL (ref 140–400)
RBC: 5.01 10*6/uL (ref 4.20–5.82)
RDW: 12.1 % (ref 11.0–14.6)
WBC: 7.1 10*3/uL (ref 4.0–10.3)

## 2010-11-26 LAB — COMPREHENSIVE METABOLIC PANEL
Albumin: 4.7 g/dL (ref 3.5–5.2)
BUN: 18 mg/dL (ref 6–23)
CO2: 24 mEq/L (ref 19–32)
Glucose, Bld: 100 mg/dL — ABNORMAL HIGH (ref 70–99)
Potassium: 4.5 mEq/L (ref 3.5–5.3)
Sodium: 139 mEq/L (ref 135–145)
Total Protein: 7.1 g/dL (ref 6.0–8.3)

## 2010-12-03 ENCOUNTER — Telehealth: Payer: Self-pay | Admitting: *Deleted

## 2010-12-03 MED ORDER — GABAPENTIN 100 MG PO CAPS: 100.0000 mg | ORAL_CAPSULE | Freq: Three times a day (TID) | ORAL | Status: DC

## 2010-12-03 NOTE — Telephone Encounter (Signed)
Patient requesting RX for gabapentin, says MD advised to take 100 mg tid. OK for RX?

## 2010-12-03 NOTE — Telephone Encounter (Signed)
K - Rx sent in.  

## 2010-12-11 ENCOUNTER — Encounter: Payer: Self-pay | Admitting: Internal Medicine

## 2010-12-18 ENCOUNTER — Other Ambulatory Visit: Payer: Self-pay | Admitting: Internal Medicine

## 2010-12-25 LAB — TISSUE TRANSGLUTAMINASE, IGA: Tissue Transglutaminase Ab, IgA: 0.1 U/mL (ref <7)

## 2010-12-25 LAB — COMPREHENSIVE METABOLIC PANEL
Alkaline Phosphatase: 74
BUN: 7
CO2: 27
Chloride: 105
Creatinine, Ser: 0.8
GFR calc non Af Amer: >60
Glucose, Bld: 112 — ABNORMAL HIGH
Potassium: 4.5
Total Bilirubin: 0.5

## 2010-12-25 LAB — CBC
Hemoglobin: 16.6
RBC: 5.13
WBC: 7.6

## 2010-12-25 LAB — TSH: TSH: 1.957

## 2011-02-24 ENCOUNTER — Other Ambulatory Visit: Payer: Self-pay | Admitting: Internal Medicine

## 2011-02-24 MED ORDER — GABAPENTIN 100 MG PO CAPS: ORAL_CAPSULE | ORAL | Status: DC

## 2011-03-03 ENCOUNTER — Other Ambulatory Visit: Payer: Self-pay | Admitting: Internal Medicine

## 2011-03-04 ENCOUNTER — Telehealth: Payer: Self-pay | Admitting: *Deleted

## 2011-03-12 NOTE — Telephone Encounter (Signed)
refills  

## 2011-03-23 ENCOUNTER — Telehealth: Payer: Self-pay | Admitting: *Deleted

## 2011-03-23 NOTE — Telephone Encounter (Signed)
Refill request for Norco 5/325 SIG 1 q 6 hours prn , Please Advise refills

## 2011-03-23 NOTE — Telephone Encounter (Signed)
OK for refill, #60, 2 refills, please add to medication list.

## 2011-03-23 NOTE — Telephone Encounter (Signed)
Pt states he is being Tx for peripheral neuropathy w/gabapentin [100mg  TID original Rx] and was instructed to increase Rx as tolerated, Pt states he is up to 5 caps QD now and request new Rx to cover the increase in usage. Pt also request referral to Neurology.

## 2011-03-24 ENCOUNTER — Encounter: Payer: Self-pay | Admitting: Neurology

## 2011-03-24 ENCOUNTER — Telehealth: Payer: Self-pay | Admitting: *Deleted

## 2011-03-24 DIAGNOSIS — R209 Unspecified disturbances of skin sensation: Secondary | ICD-10-CM

## 2011-03-24 MED ORDER — GABAPENTIN 300 MG PO CAPS: ORAL_CAPSULE | ORAL | Status: DC

## 2011-03-24 NOTE — Telephone Encounter (Signed)
Addended byRosalio Macadamia, Rhylee Nunn B on: 03/24/2011 04:01 PM   Modules accepted: Orders

## 2011-03-24 NOTE — Telephone Encounter (Signed)
Not previous note [my note dated 12.18.12; new note dated 12.19.12] but I'll let Ami complete request.

## 2011-03-24 NOTE — Telephone Encounter (Signed)
Ok for neurontin refill 300 mg qid or 300mg  AM, PM and 600mg  qhs , whichever he is doing. Change med list - 300mg  tabs #120, refill 11  Referral to Dr. Modesto Charon entered

## 2011-03-24 NOTE — Telephone Encounter (Signed)
Pt informed

## 2011-03-24 NOTE — Telephone Encounter (Signed)
See previous phone note.  

## 2011-03-24 NOTE — Telephone Encounter (Signed)
Closed prematurely - see original for response

## 2011-03-24 NOTE — Telephone Encounter (Signed)
Pt is needing refill on his neurontin he states he takes up to 1200 mg a day. He states this was increased. Pt also wants a referral to neurology for his peripheral neuropathy.

## 2011-05-05 ENCOUNTER — Ambulatory Visit: Payer: PRIVATE HEALTH INSURANCE | Admitting: Neurology

## 2011-05-24 ENCOUNTER — Other Ambulatory Visit: Payer: Self-pay | Admitting: Internal Medicine

## 2011-06-07 ENCOUNTER — Other Ambulatory Visit: Payer: Self-pay | Admitting: Neurology

## 2011-06-07 ENCOUNTER — Other Ambulatory Visit (INDEPENDENT_AMBULATORY_CARE_PROVIDER_SITE_OTHER): Payer: PRIVATE HEALTH INSURANCE

## 2011-06-07 ENCOUNTER — Encounter: Payer: Self-pay | Admitting: Neurology

## 2011-06-07 ENCOUNTER — Ambulatory Visit (INDEPENDENT_AMBULATORY_CARE_PROVIDER_SITE_OTHER): Payer: PRIVATE HEALTH INSURANCE | Admitting: Neurology

## 2011-06-07 VITALS — BP 122/78 | HR 88 | Wt 184.0 lb

## 2011-06-07 DIAGNOSIS — G609 Hereditary and idiopathic neuropathy, unspecified: Secondary | ICD-10-CM

## 2011-06-07 LAB — COMPREHENSIVE METABOLIC PANEL
ALT: 51 U/L (ref 0–53)
AST: 59 U/L — ABNORMAL HIGH (ref 0–37)
Alkaline Phosphatase: 66 U/L (ref 39–117)
BUN: 14 mg/dL (ref 6–23)
Calcium: 9.7 mg/dL (ref 8.4–10.5)
Chloride: 108 mEq/L (ref 96–112)
Creatinine, Ser: 1 mg/dL (ref 0.4–1.5)
Potassium: 4.1 mEq/L (ref 3.5–5.1)

## 2011-06-07 LAB — TSH: TSH: 1.91 u[IU]/mL (ref 0.35–5.50)

## 2011-06-07 LAB — VITAMIN B12: Vitamin B-12: 983 pg/mL — ABNORMAL HIGH (ref 211–911)

## 2011-06-07 LAB — CBC WITH DIFFERENTIAL/PLATELET
Basophils Absolute: 0.1 10*3/uL (ref 0.0–0.1)
Basophils Relative: 1.1 % (ref 0.0–3.0)
Eosinophils Absolute: 0.2 10*3/uL (ref 0.0–0.7)
Lymphocytes Relative: 25.2 % (ref 12.0–46.0)
MCHC: 34.2 g/dL (ref 30.0–36.0)
MCV: 95.7 fl (ref 78.0–100.0)
Monocytes Absolute: 0.6 10*3/uL (ref 0.1–1.0)
Neutro Abs: 3.6 10*3/uL (ref 1.4–7.7)
Neutrophils Relative %: 60 % (ref 43.0–77.0)
RBC: 5.06 Mil/uL (ref 4.22–5.81)
RDW: 12.1 % (ref 11.5–14.6)

## 2011-06-07 LAB — HEMOGLOBIN A1C: Hgb A1c MFr Bld: 5.3 % (ref 4.6–6.5)

## 2011-06-07 NOTE — Progress Notes (Signed)
Dear Dr. Debby Bud,  Thank you for having me see [name] Page in consultation today at [facility] for his problem with peripheral neuropathy.  As you may recall, he is a 61 y.o. 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 It started with the sensation of his "socks been bunched up underneath his toes". This was around the same time he was treated with metronidazole for his diverticulitis. He metronidazole was taken for approximately 6 weeks. He also had resection for a carcinoid tumor of the lung in July 2011. He has not treated with chemotherapy. About 2 months after he first noticed his symptoms began having numbness and tingling in his feet. This was followed several months later by numbness and tingling in his hands. B12 and thyroid tests were unremarkable. He was placed on B12 supplement was unsure whether it made any difference. He's taking Neurontin 300 mg at night and has felt this has helped. He does feel that he is less stable on his feet. He finds it hard to exercise.  He denies of viral infections or vaccines before the onset of his symptoms. He has not tried any other neuropathic agents.  Past Medical History  Diagnosis Date  . Disturbance of skin sensation   . Other diseases of lung, not elsewhere classified   . Other abnormal blood chemistry   . Other testicular hypofunction   . Depressive disorder, not elsewhere classified   . Esophageal reflux   . Allergic rhinitis, cause unspecified   . Personal history of urinary calculi   . Personal history of other diseases of digestive system     Past Surgical History  Procedure Date  . Parotid tumor surgery   . Tonsillectomy and adenoidectomy   . Minithoractomy with partial lobectomy for pulmonary carcinoid 6-11    [name]    History     Social History  . Marital Status: Married    Spouse Name: N/A    Number of Children: 0  . Years of Education: 85   Occupational History  . 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     Social History Main Topics  . Smoking status: Never Smoker   . Smokeless tobacco: Never Used  . Alcohol Use: Yes  . Drug Use: No  . Sexually Active: Yes -- Male partner(s)   Other Topics Concern  . 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 Married 30 + years. No children, lots of critters.     Family History:  no history of neuropathies.  Current Outpatient Prescriptions on File Prior to Visit  Medication Sig Dispense Refill  . b complex vitamins tablet Take 1 tablet by mouth daily.        Marland Kitchen buPROPion (WELLBUTRIN XL) 300 MG 24 hr tablet TAKE 1 TABLET BY MOUTH DAILY.  30 tablet  3  . Cholecalciferol (VITAMIN D) 2000 UNITS CAPS Take by mouth.        . clonazePAM (KLONOPIN) 0.5 MG tablet Take 1 tablet (0.5 mg total) by mouth 2 (two) times daily as needed.  60 tablet  5  . EPINEPHrine (EPIPEN) 0.3 mg/0.3 mL DEVI Inject 0.3 mLs (0.3 mg total) into the muscle once.  2 Device  2  . gabapentin (NEURONTIN) 100 MG capsule Titrate up dose: 100 mg  AM, 100 mg PM, 300 mg hs. Slowly increase to 300 mg tid.  270 capsule  1  . gabapentin (NEURONTIN) 300 MG capsule 1 capsule bid, and 2 capsules qhs  120 capsule  3  . MULTIPLE VITAMIN PO Take by mouth.        . Omega-3 Fatty Acids (FISH OIL) 1000 MG CAPS Take by mouth.        . co-enzyme Q-10 50 MG capsule Take 50 mg by mouth daily.        . fexofenadine (ALLEGRA) 180 MG tablet Take 180 mg by mouth as needed.        . nitroGLYCERIN (NITROSTAT) 0.4 MG SL tablet Place 1 tablet (0.4 mg total) under the tongue every 5 (five) minutes as needed for chest pain.  30 tablet  3    Allergies  Allergen Reactions  . Flagyl (Metronidazole Hcl)   . Nsaids     REACTION: intolereance      ROS:  13 systems were  reviewed and are notable for depression and anxiety, headaches, problems with memory, difficulty concentrating, shortness of breath, difficulty with balance, arthritis.  All other review of systems are unremarkable.   Examination:  Filed Vitals:   06/07/11 0931  BP: 122/78  Pulse: 88  Weight: 184 lb (83.462 kg)     In general, well appearing man.  Cardiovascular: The patient has a regular rate and rhythm and no carotid bruits.  Fundoscopy:  Disks are flat. Vessel caliber within normal limits.  Mental status:   The patient is oriented to person, place and time. Recent and remote memory are intact. Attention span and concentration are normal. Language including repetition, naming, following commands are intact. Fund of knowledge of current and historical events, as well as vocabulary are normal.  Cranial Nerves: Pupils are equally round and reactive to light. Visual fields full to confrontation. Extraocular movements are intact without nystagmus. Facial sensation and muscles of mastication are intact. Muscles of facial expression are symmetric. Hearing intact to bilateral finger rub. Tongue protrusion, uvula, palate midline.  Shoulder shrug intact  Motor:  The patient has normal bulk and tone, no pronator drift.  There are no adventitious movements.  5/5 muscle strength bilaterally.  Reflexes:  1+ throughout.  Toes down  Coordination:  Normal finger to nose.  No dysdiadokinesia.  Sensation is decreased to temperature to mid forearm and mid calf.  Vibration R-S 4 at feet.  Position sense intact.  Gait and Station are normal.  Tandem gait is intact.  Romberg is negative   Impression:  1.  Idiopathic distal symmetric peripheral neuropathy - The relatively rapid onset makes me concerned for a demyelinating neuropathy.  I am going to get an NCS without EMG of his right upper and lower extremity.  I am also going to get extended neuropathy labs.  It is possible metronidazole  contributed to the neuropathy, but given it has been stopped for over a year it is unlikely to be the primary cause.  He can continue to use gabapentin at night as it seems to be useful.   We will see the patient back in six weeks.  Thank you for having [place] see Kinnie Scales Densmore in consultation.  Feel free to contact me with any questions.  Lupita Raider Modesto Charon, MD [facility], [place] [address] [place], [place] [address] Phone: [phone] Fax: [phone].

## 2011-06-07 NOTE — Patient Instructions (Signed)
Go to the basement to have your labs drawn today.  . 

## 2011-06-08 LAB — C-REACTIVE PROTEIN: CRP: 0.14 mg/dL (ref <0.60)

## 2011-06-08 LAB — ANA: Anti Nuclear Antibody(ANA): NEGATIVE

## 2011-06-09 LAB — SPEP & IFE WITH QIG
Albumin ELP: 64.5 % (ref 55.8–66.1)
Beta Globulin: 5.1 % (ref 4.7–7.2)
IgA: 86 mg/dL (ref 68–379)
IgG (Immunoglobin G), Serum: 961 mg/dL (ref 650–1600)
IgM, Serum: 70 mg/dL (ref 41–251)
Total Protein, Serum Electrophoresis: 6.8 g/dL (ref 6.0–8.3)

## 2011-06-10 LAB — METHYLMALONIC ACID, SERUM: Methylmalonic Acid, Quant: 0.17 umol/L (ref <0.40)

## 2011-06-11 LAB — VITAMIN B1: Vitamin B1 (Thiamine): 45 nmol/L — ABNORMAL HIGH (ref 8–30)

## 2011-06-12 ENCOUNTER — Other Ambulatory Visit: Payer: Self-pay | Admitting: Internal Medicine

## 2011-06-14 ENCOUNTER — Telehealth: Payer: Self-pay | Admitting: Neurology

## 2011-06-14 NOTE — Telephone Encounter (Signed)
Message copied by Benay Spice on Mon Jun 14, 2011  9:32 AM ------      Message from: Milas Gain      Created: Sun Jun 13, 2011  6:23 PM       Let MR. Mckellar know that labs I drew were ok.

## 2011-06-14 NOTE — Telephone Encounter (Signed)
Called and spoke with the patient. Informed lab work normal as directed by Dr. Modesto Charon. Pt requested a copy be sent to him at his home address (done). No additional issues or concerns at this time.

## 2011-06-15 ENCOUNTER — Other Ambulatory Visit: Payer: Self-pay | Admitting: Internal Medicine

## 2011-06-27 NOTE — Progress Notes (Signed)
EMG/NCS revealed no signs of neuropathy.

## 2011-06-28 ENCOUNTER — Telehealth: Payer: Self-pay | Admitting: Neurology

## 2011-06-28 NOTE — Telephone Encounter (Signed)
Left a message for the patient to return my call.  

## 2011-06-28 NOTE — Telephone Encounter (Signed)
Message copied by Benay Spice on Mon Jun 28, 2011  3:41 PM ------      Message from: Milas Gain      Created: Sun Jun 27, 2011  2:56 PM       Let patient know that his EMG/NCS was normal -- however, we can see this even in individuals who have neuropathies at times.

## 2011-06-28 NOTE — Telephone Encounter (Signed)
The patient returned my call. Information given re: normal nerve testing. The patient voiced no other concerns at this time.

## 2011-06-30 ENCOUNTER — Encounter: Payer: Self-pay | Admitting: Neurology

## 2011-07-27 ENCOUNTER — Ambulatory Visit: Payer: PRIVATE HEALTH INSURANCE | Admitting: Neurology

## 2011-09-10 ENCOUNTER — Other Ambulatory Visit: Payer: Self-pay | Admitting: Internal Medicine

## 2011-09-25 IMAGING — CR DG CHEST 2V
3 series · 3 of 3 positions shown · non-contrast
Comparison: CT chest scan 09/10/2009. PET scan 09/17/2009

CLINICAL DATA: Right lower lobe nodule.  Preop workup.

CHEST - 2 VIEW

[view not recorded (1 of 3)]
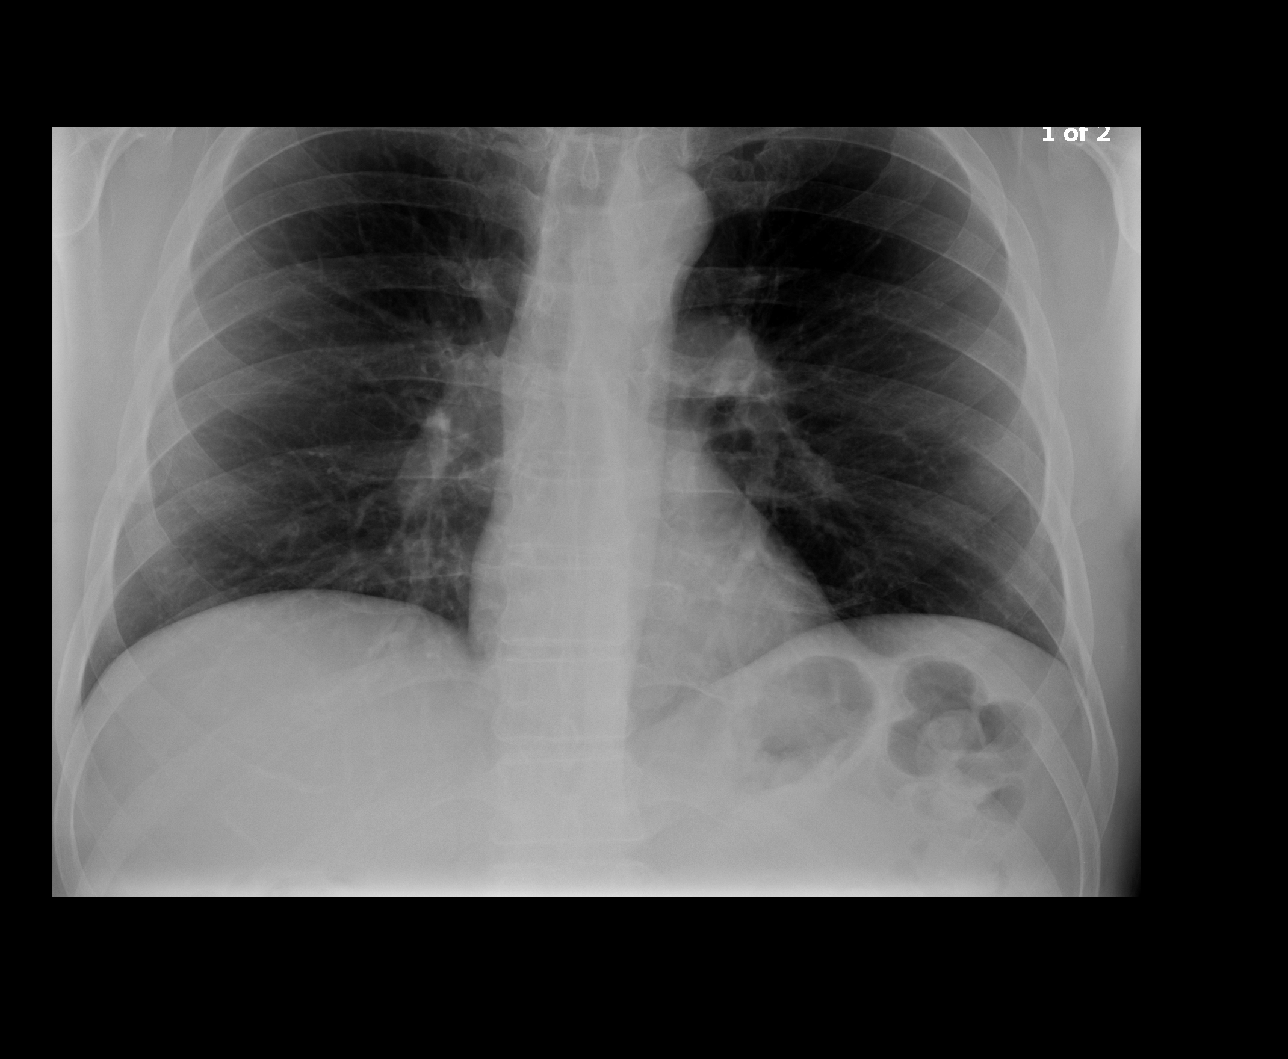

[view not recorded (2 of 3)]
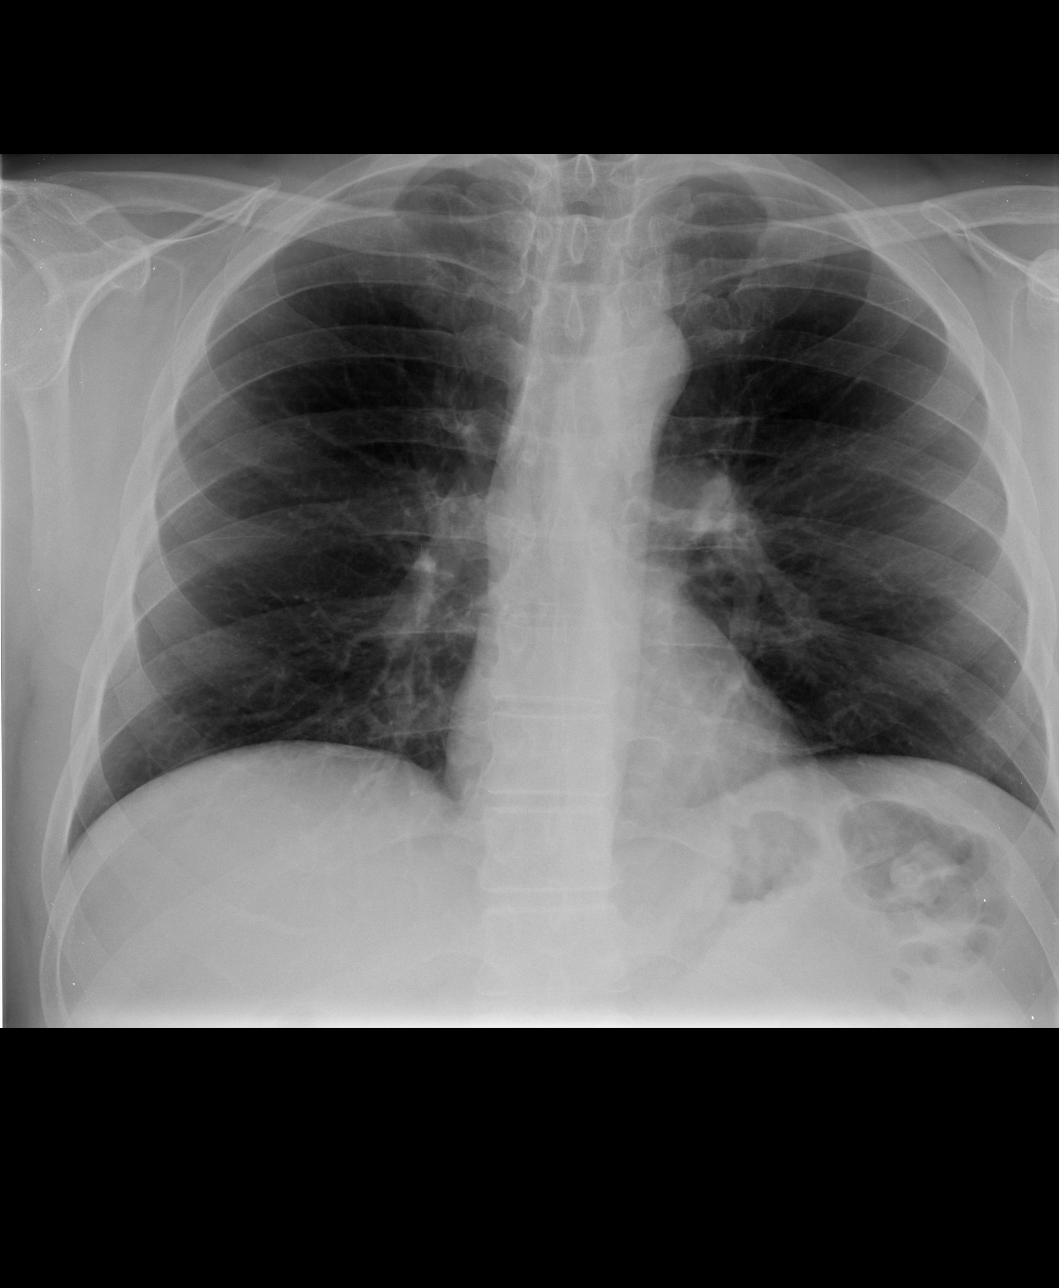

[view not recorded (3 of 3)]
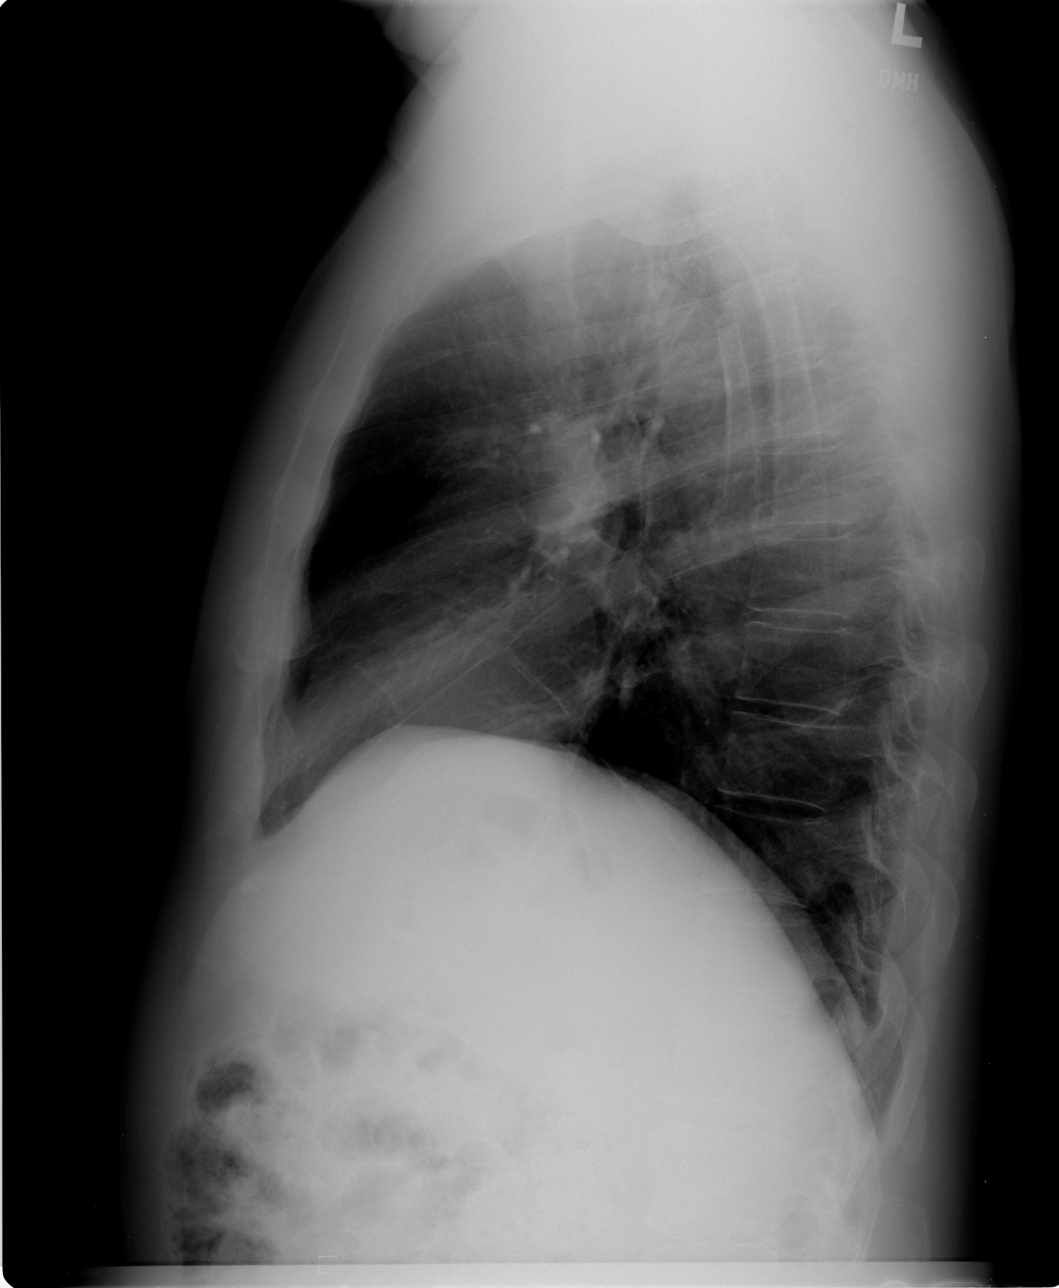

[3 of 3 positions shown; findings below may reference images not displayed]

FINDINGS: The patient has a small nodule in the medial basal
segment of the right lower lobe which is difficult to appreciate on
this exam.

Normal cardiomediastinal silhouette.  No hilar enlargement.  No
pleural fluid.  Bony thorax intact.
IMPRESSION: It is difficult to appreciate the right lower lobe nodule on this
exam. An 8 x 10 x 13 mm nodule was appreciated on the CT scan.

## 2011-09-29 IMAGING — CT CT ABD-PELV W/ CM
2 of 4 series · 17 of 46 positions shown, 19 images · IV contrast (READICAT/WATER & [ID] OMNI 300)
Comparison: 09/09/2009

CLINICAL DATA: Follow up of diverticulitis.  Left lower quadrant
pain.  Preop clearance for lung nodule resection.

CT ABDOMEN AND PELVIS WITH CONTRAST
TECHNIQUE: Multidetector CT imaging of the abdomen and pelvis was
performed following the standard protocol during bolus
administration of intravenous contrast.
Contrast: 100  ml Dmnipaque-GII

[Series 2: a&pw/ · axial · 0.70mm/px · z∈[-441,+39]mm · 14 of 106 slices shown, 16 images]
[im 5/106  soft-tissue]
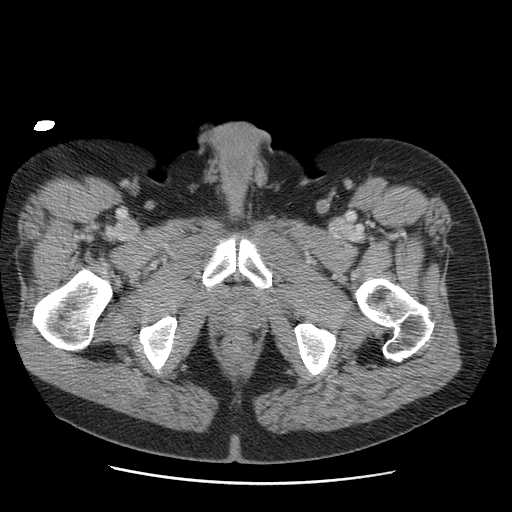
[im 5/106  bone]
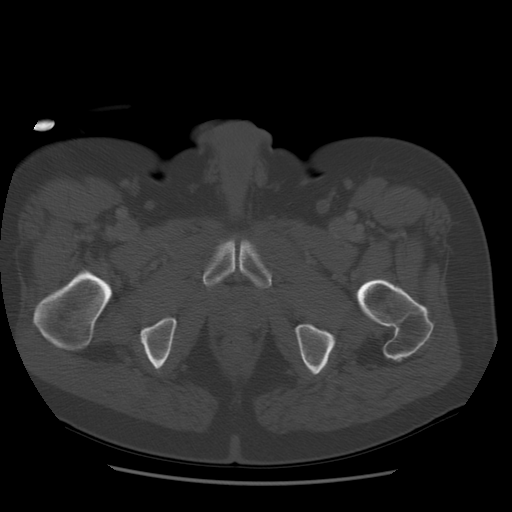
[im 14/106  soft-tissue]
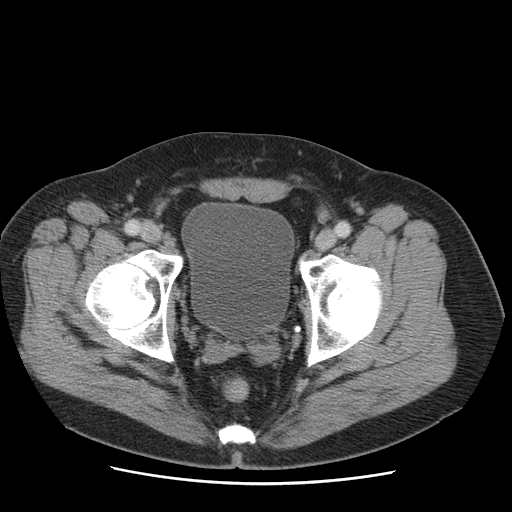
[im 22/106  soft-tissue]
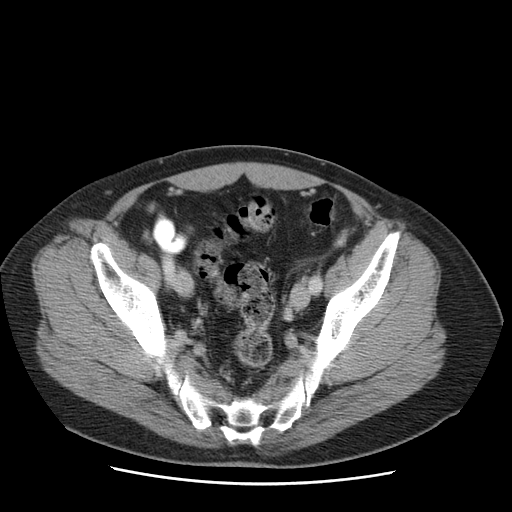
[im 27/106  soft-tissue]
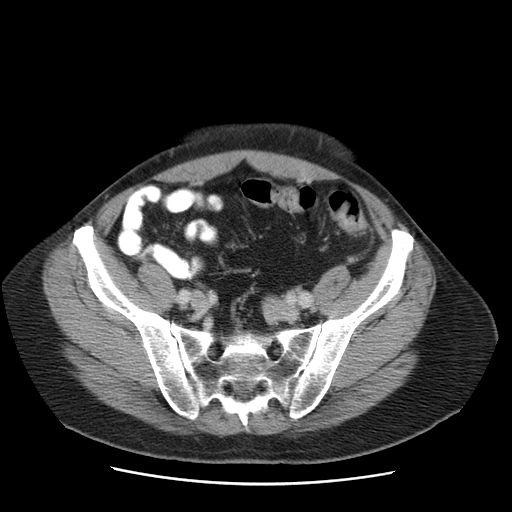
[im 36/106  soft-tissue]
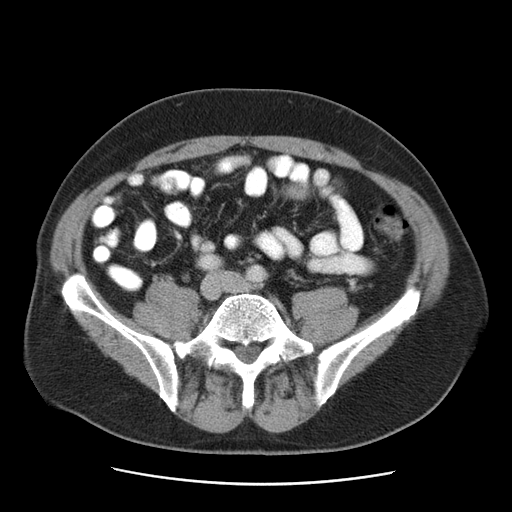
[im 44/106  soft-tissue]
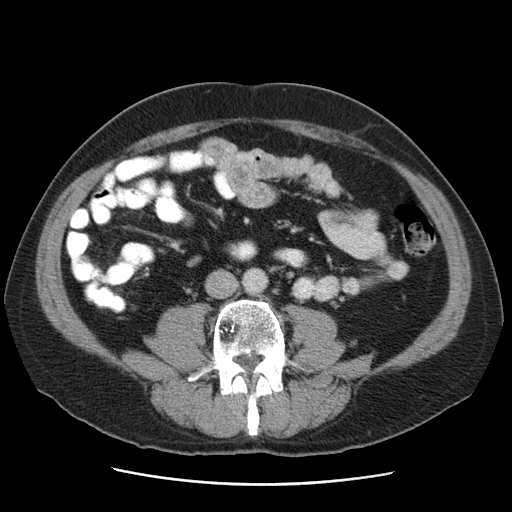
[im 49/106  soft-tissue]
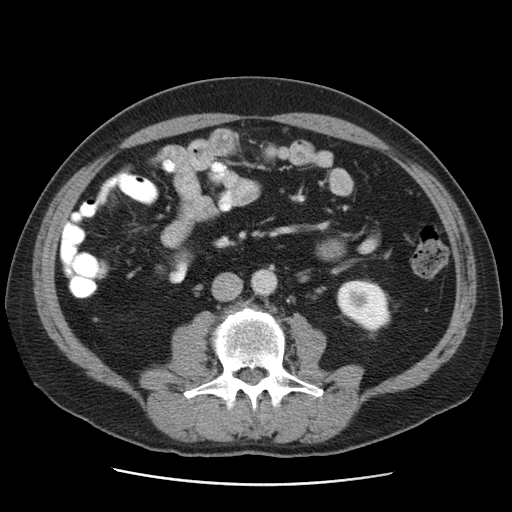
[im 57/106  soft-tissue]
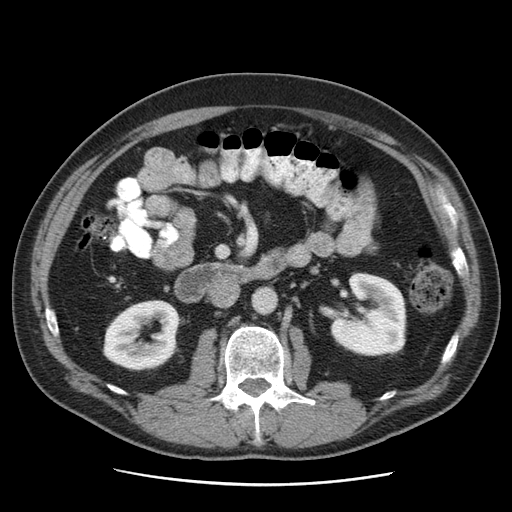
[im 62/106  soft-tissue]
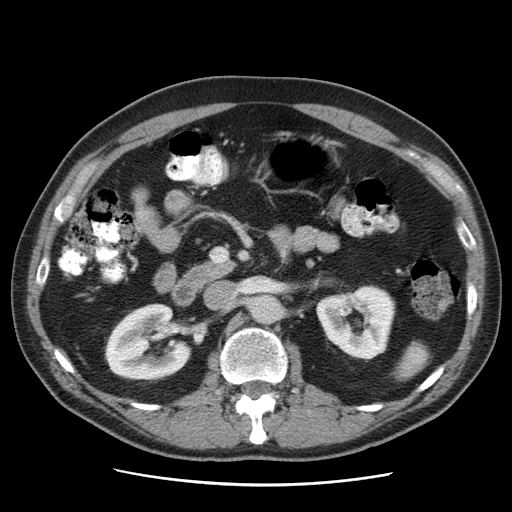
[im 62/106  bone]
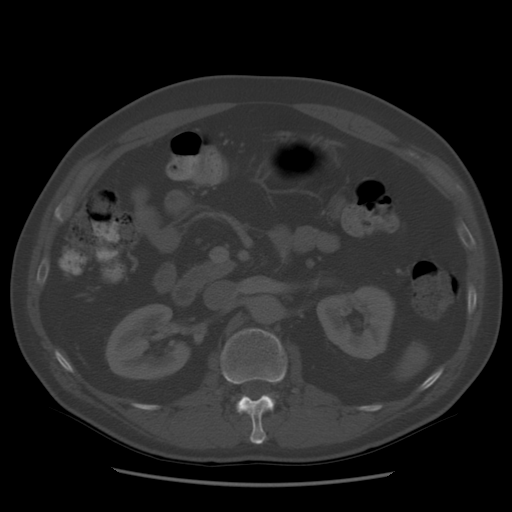
[im 71/106  soft-tissue]
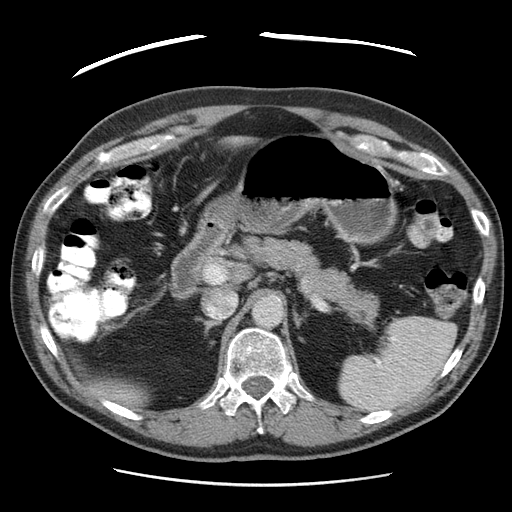
[im 79/106  soft-tissue]
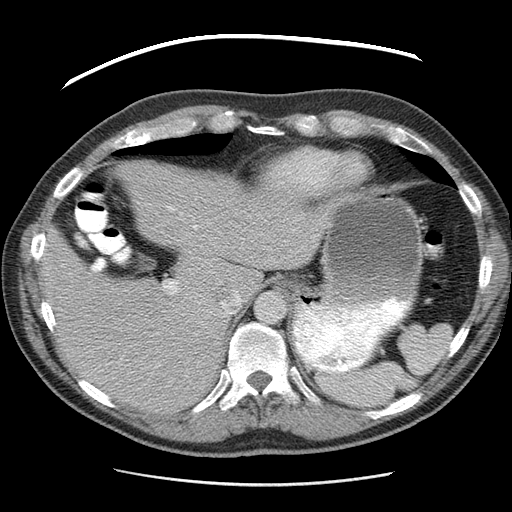
[im 84/106  soft-tissue]
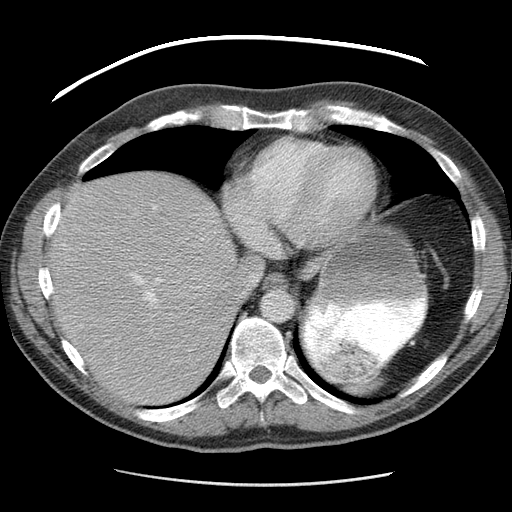
[im 92/106  soft-tissue]
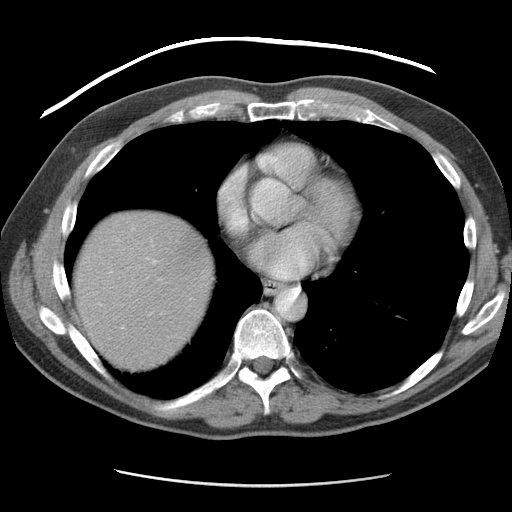
[im 101/106  soft-tissue]
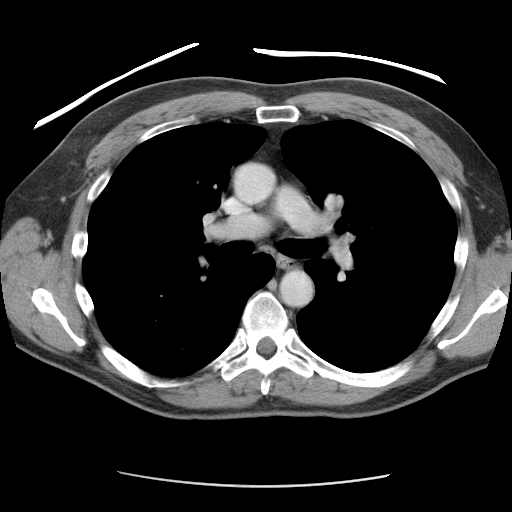

[Series 400: cor abd · coronal · 1.07mm/px · 3 of 137 slices shown]
[im 46/137  soft-tissue]
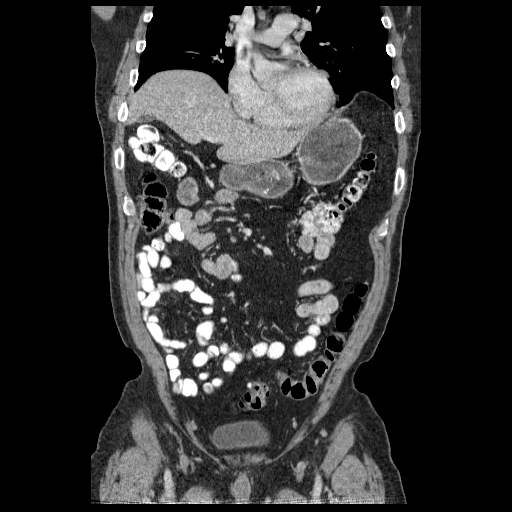
[im 61/137  soft-tissue]
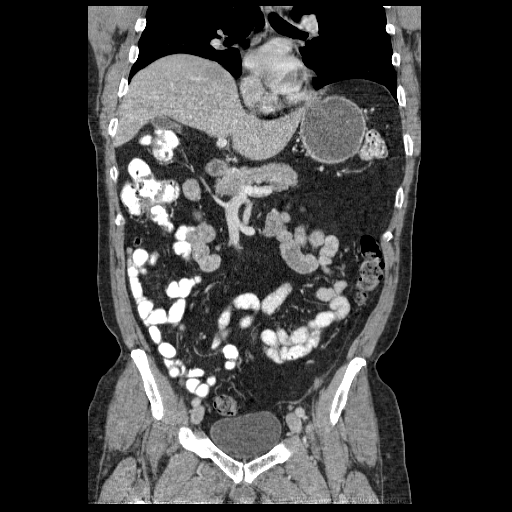
[im 76/137  soft-tissue]
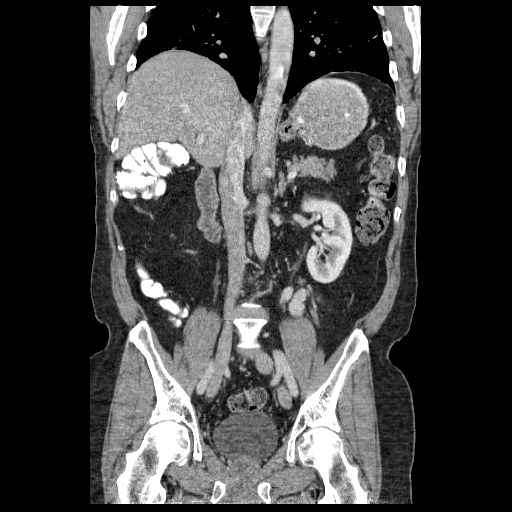

[17 of 46 positions shown; findings below may reference images not displayed]

FINDINGS: Right lower lobe lung nodule as has been evaluated
previously. Normal heart size without pericardial or pleural
effusion.  Normal liver, spleen, stomach, pancreas, gallbladder,
biliary tract, adrenal glands, kidneys.  Small retroperitoneal
lymph nodes without adenopathy.  There is a probable node measuring
1.1 cm along the ventral aspect of pancreatic head on image 40.
Coronal image 57.

There has been resolution of edema at the descending/sigmoid
junction.  There is minimal fascial thickening in this area
including on image 80, which is felt to be the residua of prior
diverticulitis.  No pericolonic abscess or free perforation.

Normal terminal ileum and appendix.  Normal small bowel without
abdominal ascites.

  Normal small bowel without abdominal ascites.

  No pelvic adenopathy.    Normal urinary bladder and prostate.  No
significant free fluid.  Hemangioma within the right side of L4.
IMPRESSION: 1.  Resolved descending/sigmoid diverticulitis.  Minimal residual
fascial thickening is likely secondary.
2. No acute process in the abdomen or pelvis.
3.  Right lower lobe lung nodule, as has been evaluated on prior CT
and PET.
4.  Probable lymph node, upper limits of normal in size along the
ventral aspect of the pancreatic head.  This can be reevaluated on
follow-up imaging.  Likely reactive.

## 2011-10-25 ENCOUNTER — Encounter: Payer: Self-pay | Admitting: Internal Medicine

## 2011-10-25 ENCOUNTER — Ambulatory Visit (INDEPENDENT_AMBULATORY_CARE_PROVIDER_SITE_OTHER): Payer: PRIVATE HEALTH INSURANCE | Admitting: Internal Medicine

## 2011-10-25 VITALS — BP 142/90 | HR 79 | Temp 97.6°F | Resp 18 | Wt 180.0 lb

## 2011-10-25 DIAGNOSIS — T63441A Toxic effect of venom of bees, accidental (unintentional), initial encounter: Secondary | ICD-10-CM

## 2011-10-25 DIAGNOSIS — Z889 Allergy status to unspecified drugs, medicaments and biological substances status: Secondary | ICD-10-CM | POA: Insufficient documentation

## 2011-10-25 DIAGNOSIS — T63461A Toxic effect of venom of wasps, accidental (unintentional), initial encounter: Secondary | ICD-10-CM

## 2011-10-25 DIAGNOSIS — T7840XA Allergy, unspecified, initial encounter: Secondary | ICD-10-CM

## 2011-10-25 DIAGNOSIS — T6391XA Toxic effect of contact with unspecified venomous animal, accidental (unintentional), initial encounter: Secondary | ICD-10-CM

## 2011-10-25 MED ORDER — SODIUM CHLORIDE 0.9 % IV SOLN: 125.0000 mg | Freq: Once | INTRAVENOUS | Status: DC

## 2011-10-25 MED ORDER — METHYLPREDNISOLONE SODIUM SUCC 125 MG IJ SOLR: 125.0000 mg | Freq: Once | INTRAMUSCULAR | Status: DC

## 2011-10-25 MED ORDER — METHYLPREDNISOLONE 4 MG PO KIT: PACK | ORAL | Status: AC

## 2011-10-26 ENCOUNTER — Encounter: Payer: Self-pay | Admitting: Internal Medicine

## 2011-10-26 MED ORDER — SODIUM CHLORIDE 0.9 % IV SOLN
125.0000 mg | Freq: Once | INTRAVENOUS | Status: AC
  Administered 2011-10-25: 130 mg via INTRAMUSCULAR

## 2011-10-26 NOTE — Addendum Note (Signed)
Addended by: Elnora Morrison on: 10/26/2011 07:55 AM   Modules accepted: Orders

## 2011-10-26 NOTE — Progress Notes (Signed)
  Subjective:    Patient ID: [name] Page, male    DOB: 1950/09/29, 61 y.o.   MRN: [id number]  HPI Tia is seen acutely: he had hymenoptera sting about an hour ago, second sting in a week, and started to have swelling of the tongue and tightening of the throat. He took benadryl and claritin and came to the office.   PMH, FamHx and SocHx reviewed for any changes and relevance.    Review of Systems System review is negative for any constitutional, cardiac, pulmonary, GI or neuro symptoms or complaints other than as described in the HPI.     Objective:   Physical Exam Filed Vitals:   10/25/11 1658  BP: 142/90  Pulse: 79  Temp: 97.6 F (36.4 C)  Resp: 18   O2 saturation 98% Gen'l- WNWD white man in no acute distress HEENT- no visible swelling of the tongue or posterior pharynx Pulm - normal respirations w/o wheezing       Assessment & Plan:  Allergic reaction hymenoptera - Plan Solumedrol 1225 mg IM  Medrol dose pak  Keep epipen available - increased sensitization with repeat exposure to venom explained.

## 2011-10-29 ENCOUNTER — Telehealth: Payer: Self-pay | Admitting: Internal Medicine

## 2011-10-29 NOTE — Telephone Encounter (Signed)
lmonvm for pt re appt for 8/13 lb/ct and 8/15 MM. Schedule mailed.

## 2011-11-16 ENCOUNTER — Other Ambulatory Visit (HOSPITAL_BASED_OUTPATIENT_CLINIC_OR_DEPARTMENT_OTHER): Payer: PRIVATE HEALTH INSURANCE

## 2011-11-16 ENCOUNTER — Ambulatory Visit
Admission: RE | Admit: 2011-11-16 | Discharge: 2011-11-16 | Disposition: A | Discharge status: Home / Self Care | Payer: PRIVATE HEALTH INSURANCE | Source: Ambulatory Visit | Attending: Internal Medicine | Admitting: Internal Medicine

## 2011-11-16 DIAGNOSIS — C3490 Malignant neoplasm of unspecified part of unspecified bronchus or lung: Secondary | ICD-10-CM

## 2011-11-16 DIAGNOSIS — C172 Malignant neoplasm of ileum: Secondary | ICD-10-CM

## 2011-11-16 LAB — CBC WITH DIFFERENTIAL/PLATELET
BASO%: 0.6 % (ref 0.0–2.0)
Basophils Absolute: 0 10*3/uL (ref 0.0–0.1)
EOS%: 3.8 % (ref 0.0–7.0)
HCT: 42.6 % (ref 38.4–49.9)
HGB: 15.1 g/dL (ref 13.0–17.1)
MCH: 33.1 pg (ref 27.2–33.4)
MCHC: 35.4 g/dL (ref 32.0–36.0)
MCV: 93.4 fL (ref 79.3–98.0)
MONO%: 13.2 % (ref 0.0–14.0)
NEUT%: 47.1 % (ref 39.0–75.0)

## 2011-11-16 LAB — COMPREHENSIVE METABOLIC PANEL
ALT: 14 U/L (ref 0–53)
CO2: 25 mEq/L (ref 19–32)
Calcium: 8.9 mg/dL (ref 8.4–10.5)
Chloride: 106 mEq/L (ref 96–112)
Creatinine, Ser: 0.93 mg/dL (ref 0.50–1.35)
Glucose, Bld: 101 mg/dL — ABNORMAL HIGH (ref 70–99)
Total Bilirubin: 1 mg/dL (ref 0.3–1.2)
Total Protein: 6 g/dL (ref 6.0–8.3)

## 2011-11-18 ENCOUNTER — Ambulatory Visit (HOSPITAL_BASED_OUTPATIENT_CLINIC_OR_DEPARTMENT_OTHER): Payer: PRIVATE HEALTH INSURANCE | Admitting: Internal Medicine

## 2011-11-18 VITALS — BP 136/78 | HR 71 | Temp 98.3°F | Resp 20 | Ht 68.0 in | Wt 180.1 lb

## 2011-11-18 DIAGNOSIS — D3A00 Benign carcinoid tumor of unspecified site: Secondary | ICD-10-CM

## 2011-11-18 DIAGNOSIS — D3A090 Benign carcinoid tumor of the bronchus and lung: Secondary | ICD-10-CM

## 2011-11-18 NOTE — Progress Notes (Signed)
[facility] [phone]   [phone]  OFFICE PROGRESS NOTE  Illene Regulus, MD [address] 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  INTERVAL HISTORY: [name] Page 61 y.o. male returns to the clinic today for annual followup visit. The patient is doing fine with no specific complaints. He denied having any significant chest pain or shortness breath, no cough or hemoptysis. He has no significant weight loss or night sweats. No diarrhea or hot flashes. He has repeat CT scan of the chest performed recently and he is here today for evaluation and discussion of his scan results.  MEDICAL HISTORY: Past Medical History  Diagnosis Date  . Disturbance of skin sensation   . Other diseases of lung, not elsewhere classified   . Other abnormal blood chemistry   . Other testicular hypofunction   . Depressive disorder, not elsewhere classified   . Esophageal reflux   . Allergic rhinitis, cause unspecified   . Personal history of urinary calculi   . Personal history of other diseases of digestive system     ALLERGIES:  is allergic to bee venom; flagyl; and nsaids.  MEDICATIONS:  Current Outpatient Prescriptions  Medication Sig Dispense Refill  . aspirin 325 MG tablet Take 325 mg by mouth daily.      Marland Kitchen buPROPion (WELLBUTRIN XL) 300 MG 24 hr tablet TAKE 1 TABLET BY MOUTH DAILY.  30 tablet  3  . Cholecalciferol (VITAMIN D) 2000 UNITS CAPS Take by mouth.        . clonazePAM (KLONOPIN) 0.5 MG tablet Take 1 tablet (0.5 mg total) by mouth 2 (two) times daily as needed.  60 tablet  5  . gabapentin (NEURONTIN) 100 MG capsule Titrate up dose: 100 mg AM, 100 mg PM, 300 mg hs. Slowly increase to 300 mg tid.  270 capsule  1  . gabapentin (NEURONTIN) 300 MG capsule TAKE  ONE CAPSULE BY MOUTH TWICE A DAY AND 2 CAPSULES AT BEDTIME  120 capsule  3  . HYDROcodone-acetaminophen (NORCO/VICODIN) 5-325 MG per tablet Take 1 tablet by mouth every 6 (six) hours as needed.      Marland Kitchen b complex vitamins tablet Take 1 tablet by mouth daily.        Marland Kitchen co-enzyme Q-10 50 MG capsule Take 50 mg by mouth daily.        Marland Kitchen EPINEPHrine (EPIPEN) 0.3 mg/0.3 mL DEVI Inject 0.3 mLs (0.3 mg total) into the muscle once.  2 Device  2  . fexofenadine (ALLEGRA) 180 MG tablet TAKE 1 TABLET EVERY DAY FOR ALLERGY  30 tablet  2  . methylPREDNISolone sodium succinate (SOLU-MEDROL) 125 mg/2 mL injection Inject 2 mLs (125 mg total) into the muscle once.  1 each  0  . MULTIPLE VITAMIN PO Take by mouth.        . nitroGLYCERIN (NITROSTAT) 0.4 MG SL tablet Place 1 tablet (0.4 mg total) under the tongue every 5 (five) minutes as needed for chest pain.  30 tablet  3  . Omega-3 Fatty Acids (FISH OIL) 1000 MG CAPS Take by mouth.         Current Facility-Administered Medications  Medication Dose Route Frequency Provider Last Rate Last Dose  . methylPREDNISolone sodium succinate (SOLU-MEDROL) 130 mg in sodium chloride 0.9 % 50 mL IVPB  130 mg Intravenous Once Jacques Navy, MD        SURGICAL HISTORY:  Past Surgical History  Procedure Date  . Parotid tumor surgery   . Tonsillectomy and adenoidectomy   . Minithoractomy with partial lobectomy for pulmonary carcinoid 6-11    [name]    REVIEW OF SYSTEMS:  A comprehensive review of systems was negative.   PHYSICAL EXAMINATION: General appearance: alert, cooperative and no distress Head: Normocephalic, without obvious abnormality, atraumatic Neck: no adenopathy Lymph nodes: Cervical, supraclavicular, and axillary nodes normal. Resp: clear to auscultation bilaterally Back: negative, symmetric, no curvature. ROM normal. No CVA tenderness. Cardio: regular rate and rhythm, S1, S2 normal, no murmur, click, rub or gallop GI: soft, non-tender; bowel sounds  normal; no masses,  no organomegaly Extremities: extremities normal, atraumatic, no cyanosis or edema Neurologic: Alert and oriented X 3, normal strength and tone. Normal symmetric reflexes. Normal coordination and gait  ECOG PERFORMANCE STATUS: 0 - Asymptomatic  Blood pressure 136/78, pulse 71, temperature 98.3 F (36.8 C), temperature source Oral, resp. rate 20, height 5\' 8"  (1.727 m), weight 180 lb 1 oz (81.676 kg).  LABORATORY DATA: Lab Results  Component Value Date   WBC 5.0 11/16/2011   HGB 15.1 11/16/2011   HCT 42.6 11/16/2011   MCV 93.4 11/16/2011   PLT 196 11/16/2011      Chemistry      Component Value Date/Time   NA 138 11/16/2011 1111   K 4.3 11/16/2011 1111   CL 106 11/16/2011 1111   CO2 25 11/16/2011 1111   BUN 10 11/16/2011 1111   CREATININE 0.93 11/16/2011 1111      Component Value Date/Time   CALCIUM 8.9 11/16/2011 1111   ALKPHOS 77 11/16/2011 1111   AST 17 11/16/2011 1111   ALT 14 11/16/2011 1111   BILITOT 1.0 11/16/2011 1111       RADIOGRAPHIC STUDIES: Ct Chest Wo Contrast  11/16/2011  *RADIOLOGY REPORT*  Clinical Data: History of carcinoid tumor of the lung resected in June 2011, some pain in the posterior right chest at the incision site  CT CHEST WITHOUT CONTRAST  Technique:  Multidetector CT imaging of the chest was performed following the standard protocol without IV contrast.  Comparison: CT chest of 10/16/2010  Findings: On the lung window images the postsurgical changes at the right lung base are stable with sutures present. Mild volume loss at the right lung base again is noted.  No lung parenchymal lesion is seen.  No pleural effusion is noted.  The airway appears patent. 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  On soft tissue window images, the thyroid gland is unremarkable. No mediastinal or hilar adenopathy is seen on this unenhanced study.  Small mediastinal lymph nodes appear stable.  The upper abdomen is stable and  unremarkable.  No bony abnormality is seen.  IMPRESSION:  1.  No evidence of recurrence.  Stable postsurgical changes at the right lung base with volume loss. 2.  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  Original Report Authenticated By: Juline Patch, M.D.    ASSESSMENT: This is a very pleasant 61 years old white male with history of pulmonary carcinoid tumor status post resection and currently on observation. The patient has no evidence for disease recurrence.  PLAN: I discussed the scan results with the patient.  I recommended for him continuous observation for now with repeat CT scan of the chest in one year. He  would come back for followup visit at that time. He was advised to call me immediately if he has any concerning symptoms in the interval.  All questions were answered. The patient knows to call the clinic with any problems, questions or concerns. We can certainly see the patient much sooner if necessary.

## 2011-11-22 ENCOUNTER — Telehealth: Payer: Self-pay | Admitting: *Deleted

## 2011-11-22 NOTE — Telephone Encounter (Signed)
Gave patient appointment for 11-14-2012 lab only 10:00am 11-21-2012 at 11:00am printed out calendar and gave patient

## 2012-02-11 ENCOUNTER — Other Ambulatory Visit: Payer: Self-pay | Admitting: Internal Medicine

## 2012-02-11 NOTE — Telephone Encounter (Signed)
Called the patient informed of MD instructions.  The patient stated he is requesting the medication for back pain.  The patient stated he would call back if he wanted an appointment.

## 2012-02-11 NOTE — Telephone Encounter (Signed)
Please advise on this patient of Dr. Debby Bud for request of refill on medication medrol dose pack. Last OV was 10/2011 when patient had had an allergic reaction to bee sting. Dr, Debby Bud out of office today.  sue

## 2012-02-11 NOTE — Telephone Encounter (Signed)
Not clear why pt needs this, and is not a chronic med  Needs OV - ok to [facility]

## 2012-02-16 ENCOUNTER — Ambulatory Visit (INDEPENDENT_AMBULATORY_CARE_PROVIDER_SITE_OTHER): Payer: PRIVATE HEALTH INSURANCE | Admitting: Internal Medicine

## 2012-02-16 ENCOUNTER — Encounter: Payer: Self-pay | Admitting: Internal Medicine

## 2012-02-16 VITALS — BP 152/82 | HR 74 | Temp 97.0°F | Ht 68.0 in | Wt 183.0 lb

## 2012-02-16 DIAGNOSIS — M62838 Other muscle spasm: Secondary | ICD-10-CM

## 2012-02-16 DIAGNOSIS — M549 Dorsalgia, unspecified: Secondary | ICD-10-CM

## 2012-02-16 MED ORDER — METHYLPREDNISOLONE 4 MG PO KIT: PACK | ORAL | Status: DC

## 2012-02-16 MED ORDER — DIAZEPAM 2 MG PO TABS: 2.0000 mg | ORAL_TABLET | Freq: Four times a day (QID) | ORAL | Status: DC | PRN

## 2012-02-16 MED ORDER — HYDROCODONE-ACETAMINOPHEN 5-325 MG PO TABS: 1.0000 | ORAL_TABLET | Freq: Four times a day (QID) | ORAL | Status: DC | PRN

## 2012-02-16 NOTE — Progress Notes (Signed)
Subjective:    Patient ID: [name] Page, male    DOB: 1951-01-07, 61 y.o.   MRN: [id number]  HPI  Pt presents to the clinic today with c/o mid thoracic/lumbar back pain that started 2 weeks.He has been doing some vigorous work around the house but does not recall lifting anything heavy or having a twisting injury. The pain is constant 5/10 but does get worse with movement, bending or twisting. He has been taking some ibuprofen but it is not helping much. He has also been trying to rest but this is hard as he just started a new job this week. He has experienced back pain like this before, 1 year ago. He was seen by Dr. Debby Bud who gave him a Medrol dose pack, Valium and Vicodin. This treatment was very effective.  Review of Systems  Past Medical History  Diagnosis Date  . Disturbance of skin sensation   . Other diseases of lung, not elsewhere classified   . Other abnormal blood chemistry   . Other testicular hypofunction   . Depressive disorder, not elsewhere classified   . Esophageal reflux   . Allergic rhinitis, cause unspecified   . Personal history of urinary calculi   . Personal history of other diseases of digestive system     Current Outpatient Prescriptions  Medication Sig Dispense Refill  . aspirin 325 MG tablet Take 325 mg by mouth daily.      Marland Kitchen b complex vitamins tablet Take 1 tablet by mouth daily.        Marland Kitchen buPROPion (WELLBUTRIN XL) 300 MG 24 hr tablet TAKE 1 TABLET BY MOUTH DAILY.  30 tablet  3  . Cholecalciferol (VITAMIN D) 2000 UNITS CAPS Take by mouth.        . clonazePAM (KLONOPIN) 0.5 MG tablet Take 1 tablet (0.5 mg total) by mouth 2 (two) times daily as needed.  60 tablet  5  . co-enzyme Q-10 50 MG capsule Take 50 mg by mouth daily.        Marland Kitchen EPINEPHrine (EPIPEN) 0.3 mg/0.3 mL DEVI Inject 0.3 mLs (0.3 mg total) into the muscle once.  2 Device  2  . fexofenadine (ALLEGRA) 180 MG tablet TAKE 1 TABLET EVERY DAY FOR ALLERGY  30 tablet  2  . gabapentin (NEURONTIN) 100  MG capsule Titrate up dose: 100 mg AM, 100 mg PM, 300 mg hs. Slowly increase to 300 mg tid.  270 capsule  1  . gabapentin (NEURONTIN) 300 MG capsule TAKE ONE CAPSULE BY MOUTH TWICE A DAY AND 2 CAPSULES AT BEDTIME  120 capsule  3  . HYDROcodone-acetaminophen (NORCO/VICODIN) 5-325 MG per tablet Take 1 tablet by mouth every 6 (six) hours as needed.      . methylPREDNISolone sodium succinate (SOLU-MEDROL) 125 mg/2 mL injection Inject 2 mLs (125 mg total) into the muscle once.  1 each  0  . MULTIPLE VITAMIN PO Take by mouth.        . nitroGLYCERIN (NITROSTAT) 0.4 MG SL tablet Place 1 tablet (0.4 mg total) under the tongue every 5 (five) minutes as needed for chest pain.  30 tablet  3  . Omega-3 Fatty Acids (FISH OIL) 1000 MG CAPS Take by mouth.         Current Facility-Administered Medications  Medication Dose Route Frequency Provider Last Rate Last Dose  . methylPREDNISolone sodium succinate (SOLU-MEDROL) 130 mg in sodium chloride 0.9 % 50 mL IVPB  130 mg Intravenous Once Jacques Navy, MD  Allergies  Allergen Reactions  . Bee Venom Anaphylaxis  . Flagyl (Metronidazole Hcl)   . Nsaids     REACTION: intolereance    No family history on file.  History   Social History  . Marital Status: Married    Spouse Name: N/A    Number of Children: 0  . Years of Education: 69   Occupational History  . 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     Social History Main Topics  . Smoking status: Never Smoker   . Smokeless tobacco: Never Used  . Alcohol Use: Yes  . Drug Use: No  . Sexually Active: Yes -- Male partner(s)   Other Topics Concern  . Not on file   Social History Narrative   HS[employer], College grad, [employer]. 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 Married 30 + years. No children, lots of critters. As of July '13 testing out retirement! So far so good.     Constitutional: Denies fever, malaise, fatigue, headache or abrupt weight changes.  Musculoskeletal:   Pt reports decrease in range of motion, back pain and muscle pain. Denies difficulty with gait or joint pain and swelling.   No other specific complaints in a complete review of systems (except as listed in HPI above).     Objective:   Physical Exam  There were no vitals taken for this visit. Wt Readings from Last 3 Encounters:  11/18/11 180 lb 1 oz (81.676 kg)  10/25/11 180 lb (81.647 kg)  06/07/11 184 lb (83.462 kg)    General: Appears his stated age, well developed, well nourished in NAD.  Cardiovascular: Normal rate and rhythm. S1,S2 noted.  No murmur, rubs or gallops noted. No JVD or BLE edema. No carotid bruits noted. Pulmonary/Chest: Normal effort and positive vesicular breath sounds. No respiratory distress. No wheezes, rales or ronchi noted.  Musculoskeletal: Pt has pinpoint tenderness along the thoracic/lumbar spine. Normal range of motion, but slowed. No signs of joint swelling. No difficulty with gait.        Assessment & Plan:   Back pain Muscle spasm  Back exercises given. See patient handout Try to rest over the next couple of days May apply heat to the affected area for 20 mins daily eRx given for Medrol dose pack eRx given for Valium 2 mg, no refills eRx given for Vicodin, no refills  RTC as needed or if symptoms persist

## 2012-02-16 NOTE — Patient Instructions (Signed)
Back Pain, Adult Low back pain is very common. About 1 in 5 people have back pain. The cause of low back pain is rarely dangerous. The pain often gets better over time. About half of people with a sudden onset of back pain feel better in just 2 weeks. About 8 in 10 people feel better by 6 weeks.   CAUSES Some common causes of back pain include:  Strain of the muscles or ligaments supporting the spine.   Wear and tear (degeneration) of the spinal discs.   Arthritis.   Direct injury to the back.  DIAGNOSIS Most of the time, the direct cause of low back pain is not known. However, back pain can be treated effectively even when the exact cause of the pain is unknown. Answering your caregiver's questions about your overall health and symptoms is one of the most accurate ways to make sure the cause of your pain is not dangerous. If your caregiver needs more information, he or she may order lab work or imaging tests (X-rays or MRIs). However, even if imaging tests show changes in your back, this usually does not require surgery. HOME CARE INSTRUCTIONS For many people, back pain returns. Since low back pain is rarely dangerous, it is often a condition that people can learn to manage on their own.    Remain active. It is stressful on the back to sit or stand in one place. Do not sit, drive, or stand in one place for more than 30 minutes at a time. Take short walks on level surfaces as soon as pain allows. Try to increase the length of time you walk each day.   Do not stay in bed. Resting more than 1 or 2 days can delay your recovery.   Do not avoid exercise or work. Your body is made to move. It is not dangerous to be active, even though your back may hurt. Your back will likely heal faster if you return to being active before your pain is gone.   Pay attention to your body when you  bend and lift. Many people have less discomfort when lifting if they bend their knees, keep the load close to their  bodies, and avoid twisting. Often, the most comfortable positions are those that put less stress on your recovering back.   Find a comfortable position to sleep. Use a firm mattress and lie on your side with your knees slightly bent. If you lie on your back, put a pillow under your knees.   Only take over-the-counter or prescription medicines as directed by your caregiver. Over-the-counter medicines to reduce pain and inflammation are often the most helpful. Your caregiver may prescribe muscle relaxant drugs. These medicines help dull your pain so you can more quickly return to your normal activities and healthy exercise.   Put ice on the injured area.   Put ice in a plastic bag.   Place a towel between your skin and the bag.   Leave the ice on for 15 to 20 minutes, 3 to 4 times a day for the first 2 to 3 days. After that, ice and heat may be alternated to reduce pain and spasms.   Ask your caregiver about trying back exercises and gentle massage. This may be of some benefit.   Avoid feeling anxious or stressed. Stress increases muscle tension and can worsen back pain. It is important to recognize when you are anxious or stressed and learn ways to manage it. Exercise is a great   option.  SEEK MEDICAL CARE IF:  You have pain that is not relieved with rest or medicine.   You have pain that does not improve in 1 week.   You have new symptoms.   You are generally not feeling well.  SEEK IMMEDIATE MEDICAL CARE IF:    You have pain that radiates from your back into your legs.   You develop new bowel or bladder control problems.   You have unusual weakness or numbness in your arms or legs.   You develop nausea or vomiting.   You develop abdominal pain.   You feel faint.  Document Released: 03/22/2005 Document Revised: 09/21/2011 Document Reviewed: 08/10/2010 Exit[employer] Patient Information 2013 [employer].   Back Exercises These exercises may help you when beginning to  rehabilitate your injury. Your symptoms may resolve with or without further involvement from your physician, physical therapist or athletic trainer. While completing these exercises, remember:    Restoring tissue flexibility helps normal motion to return to the joints. This allows healthier, less painful movement and activity.   An effective stretch should be held for at least 30 seconds.   A stretch should never be painful. You should only feel a gentle lengthening or release in the stretched tissue.  STRETCH  Extension, Prone on Elbows   Lie on your stomach on the floor, a bed will be too soft. Place your palms about shoulder width apart and at the height of your head.   Place your elbows under your shoulders. If this is too painful, stack pillows under your chest.   Allow your body to relax so that your hips drop lower and make contact more completely with the floor.   Hold this position for __________ seconds.   Slowly return to lying flat on the floor.  Repeat __________ times. Complete this exercise __________ times per day.   RANGE OF MOTION  Extension, Prone Press Ups   Lie on your stomach on the floor, a bed will be too soft. Place your palms about shoulder width apart and at the height of your head.   Keeping your back as relaxed as possible, slowly straighten your elbows while keeping your hips on the floor. You may adjust the placement of your hands to maximize your comfort. As you gain motion, your hands will come more underneath your shoulders.   Hold this position __________ seconds.   Slowly return to lying flat on the floor.  Repeat __________ times. Complete this exercise __________ times per day.   RANGE OF MOTION- Quadruped, Neutral Spine   Assume a hands and knees position on a firm surface. Keep your hands under your shoulders and your knees under your hips. You may place padding under your knees for comfort.   Drop your head and point your tail bone toward the  ground below you. This will round out your low back like an angry cat. Hold this position for __________ seconds.   Slowly lift your head and release your tail bone so that your back sags into a large arch, like an old horse.   Hold this position for __________ seconds.   Repeat this until you feel limber in your low back.   Now, find your "sweet spot." This will be the most comfortable position somewhere between the two previous positions. This is your neutral spine. Once you have found this position, tense your stomach muscles to support your low back.   Hold this position for __________ seconds.  Repeat __________ times.   Complete this exercise __________ times per day.   STRETCH  Flexion, Single Knee to Chest   Lie on a firm bed or floor with both legs extended in front of you.   Keeping one leg in contact with the floor, bring your opposite knee to your chest. Hold your leg in place by either grabbing behind your thigh or at your knee.   Pull until you feel a gentle stretch in your low back. Hold __________ seconds.   Slowly release your grasp and repeat the exercise with the opposite side.  Repeat __________ times. Complete this exercise __________ times per day.   STRETCH - Hamstrings, Standing  Stand or sit and extend your right / left leg, placing your foot on a chair or foot stool   Keeping a slight arch in your low back and your hips straight forward.   Lead with your chest and lean forward at the waist until you feel a gentle stretch in the back of your right / left knee or thigh. (When done correctly, this exercise requires leaning only a small distance.)   Hold this position for __________ seconds.  Repeat __________ times. Complete this stretch __________ times per day. STRENGTHENING  Deep Abdominals, Pelvic Tilt   Lie on a firm bed or floor. Keeping your legs in front of you, bend your knees so they are both pointed toward the ceiling and your feet are flat on the  floor.   Tense your lower abdominal muscles to press your low back into the floor. This motion will rotate your pelvis so that your tail bone is scooping upwards rather than pointing at your feet or into the floor.   With a gentle tension and even breathing, hold this position for __________ seconds.  Repeat __________ times. Complete this exercise __________ times per day.   STRENGTHENING  Abdominals, Crunches   Lie on a firm bed or floor. Keeping your legs in front of you, bend your knees so they are both pointed toward the ceiling and your feet are flat on the floor. Cross your arms over your chest.   Slightly tip your chin down without bending your neck.   Tense your abdominals and slowly lift your trunk high enough to just clear your shoulder blades. Lifting higher can put excessive stress on the low back and does not further strengthen your abdominal muscles.   Control your return to the starting position.  Repeat __________ times. Complete this exercise __________ times per day.   STRENGTHENING  Quadruped, Opposite UE/LE Lift   Assume a hands and knees position on a firm surface. Keep your hands under your shoulders and your knees under your hips. You may place padding under your knees for comfort.   Find your neutral spine and gently tense your abdominal muscles so that you can maintain this position. Your shoulders and hips should form a rectangle that is parallel with the floor and is not twisted.   Keeping your trunk steady, lift your right hand no higher than your shoulder and then your left leg no higher than your hip. Make sure you are not holding your breath. Hold this position __________ seconds.   Continuing to keep your abdominal muscles tense and your back steady, slowly return to your starting position. Repeat with the opposite arm and leg.  Repeat __________ times. Complete this exercise __________ times per day. Document Released: 04/09/2005 Document Revised:  06/14/2011 Document Reviewed: 07/04/2008 Exit[employer] Patient Information 2013 [employer].    

## 2012-02-22 ENCOUNTER — Other Ambulatory Visit: Payer: Self-pay | Admitting: Internal Medicine

## 2012-04-30 ENCOUNTER — Other Ambulatory Visit: Payer: Self-pay | Admitting: Internal Medicine

## 2012-06-12 ENCOUNTER — Other Ambulatory Visit: Payer: Self-pay | Admitting: *Deleted

## 2012-06-12 ENCOUNTER — Other Ambulatory Visit: Payer: Self-pay | Admitting: Internal Medicine

## 2012-06-12 MED ORDER — CLONAZEPAM 0.5 MG PO TABS: 0.5000 mg | ORAL_TABLET | Freq: Two times a day (BID) | ORAL | Status: DC | PRN

## 2012-06-12 NOTE — Telephone Encounter (Signed)
rx faxed to pharmacy manually  

## 2012-07-14 ENCOUNTER — Encounter: Payer: Self-pay | Admitting: Internal Medicine

## 2012-07-14 ENCOUNTER — Other Ambulatory Visit (INDEPENDENT_AMBULATORY_CARE_PROVIDER_SITE_OTHER): Payer: 59

## 2012-07-14 ENCOUNTER — Ambulatory Visit (INDEPENDENT_AMBULATORY_CARE_PROVIDER_SITE_OTHER): Payer: 59 | Admitting: Internal Medicine

## 2012-07-14 ENCOUNTER — Ambulatory Visit (INDEPENDENT_AMBULATORY_CARE_PROVIDER_SITE_OTHER)
Admission: RE | Admit: 2012-07-14 | Discharge: 2012-07-14 | Disposition: A | Discharge status: Home / Self Care | Payer: 59 | Source: Ambulatory Visit | Attending: Internal Medicine | Admitting: Internal Medicine

## 2012-07-14 VITALS — BP 130/74 | HR 74 | Temp 98.0°F | Resp 16 | Wt 188.8 lb

## 2012-07-14 DIAGNOSIS — R9431 Abnormal electrocardiogram [ECG] [EKG]: Secondary | ICD-10-CM

## 2012-07-14 DIAGNOSIS — R0609 Other forms of dyspnea: Secondary | ICD-10-CM

## 2012-07-14 DIAGNOSIS — E785 Hyperlipidemia, unspecified: Secondary | ICD-10-CM

## 2012-07-14 DIAGNOSIS — R062 Wheezing: Secondary | ICD-10-CM

## 2012-07-14 DIAGNOSIS — R0989 Other specified symptoms and signs involving the circulatory and respiratory systems: Secondary | ICD-10-CM

## 2012-07-14 DIAGNOSIS — E781 Pure hyperglyceridemia: Secondary | ICD-10-CM | POA: Insufficient documentation

## 2012-07-14 DIAGNOSIS — R0600 Dyspnea, unspecified: Secondary | ICD-10-CM

## 2012-07-14 LAB — COMPREHENSIVE METABOLIC PANEL
Albumin: 4.3 g/dL (ref 3.5–5.2)
Alkaline Phosphatase: 70 U/L (ref 39–117)
BUN: 14 mg/dL (ref 6–23)
Calcium: 9.3 mg/dL (ref 8.4–10.5)
Glucose, Bld: 109 mg/dL — ABNORMAL HIGH (ref 70–99)
Potassium: 4.5 mEq/L (ref 3.5–5.1)

## 2012-07-14 LAB — CBC WITH DIFFERENTIAL/PLATELET
Basophils Relative: 0.3 % (ref 0.0–3.0)
Eosinophils Relative: 2.9 % (ref 0.0–5.0)
HCT: 46.7 % (ref 39.0–52.0)
Hemoglobin: 16.1 g/dL (ref 13.0–17.0)
MCV: 94.2 fl (ref 78.0–100.0)
Monocytes Absolute: 0.8 10*3/uL (ref 0.1–1.0)
Neutrophils Relative %: 55.1 % (ref 43.0–77.0)
RBC: 4.96 Mil/uL (ref 4.22–5.81)
WBC: 6.2 10*3/uL (ref 4.5–10.5)

## 2012-07-14 LAB — LIPID PANEL
Cholesterol: 163 mg/dL (ref 0–200)
Triglycerides: 185 mg/dL — ABNORMAL HIGH (ref 0.0–149.0)

## 2012-07-14 LAB — BRAIN NATRIURETIC PEPTIDE: Pro B Natriuretic peptide (BNP): 14 pg/mL (ref 0.0–100.0)

## 2012-07-14 LAB — TROPONIN I: Troponin I: <0.01 ng/mL (ref <0.06)

## 2012-07-14 LAB — CARDIAC PANEL: Relative Index: 2.5 calc (ref 0.0–2.5)

## 2012-07-14 MED ORDER — NITROGLYCERIN 0.3 MG SL SUBL: 0.3000 mg | SUBLINGUAL_TABLET | Freq: Four times a day (QID) | SUBLINGUAL | Status: DC | PRN

## 2012-07-14 MED ORDER — ASPIRIN 325 MG PO TABS: 325.0000 mg | ORAL_TABLET | Freq: Every day | ORAL | Status: DC

## 2012-07-14 MED ORDER — BUDESONIDE-FORMOTEROL FUMARATE 80-4.5 MCG/ACT IN AERO: 2.0000 | INHALATION_SPRAY | Freq: Two times a day (BID) | RESPIRATORY_TRACT | Status: DC

## 2012-07-14 NOTE — Assessment & Plan Note (Signed)
FLP today 

## 2012-07-14 NOTE — Assessment & Plan Note (Signed)
His EKG shows incomplete RBBB and very mild LAE, this is unchanged since 10/2010 He tells me that he had a normal cardiac cath in 2006 I am concerned about his symptoms so I will check today for CHF and ischemia with labs I have also asked him to have a myoview scan done to look for ischemia

## 2012-07-14 NOTE — Patient Instructions (Signed)

## 2012-07-14 NOTE — Assessment & Plan Note (Signed)
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

## 2012-07-14 NOTE — Progress Notes (Signed)
Subjective:    Patient ID: [name] Page, male    DOB: Jul 28, 1950, 62 y.o.   MRN: [id number]  Shortness of Breath This is a chronic problem. The current episode started more than 1 year ago. The problem occurs intermittently. The problem has been gradually worsening (has worsened over the last 2 months). Associated symptoms include chest pain (intermittent tightness with exertion and at rest) and wheezing. Pertinent negatives include no abdominal pain, claudication, coryza, ear pain, fever, headaches, hemoptysis, leg pain, leg swelling, neck pain, orthopnea, PND, rash, rhinorrhea, sore throat, sputum production, swollen glands, syncope or vomiting. The symptoms are aggravated by emotional upset. The patient has no known risk factors for DVT/PE. Treatments tried: pepcid and klonopin. The treatment provided moderate relief.      Review of Systems  Constitutional: Negative.  Negative for fever, chills, diaphoresis, activity change, appetite change, fatigue and unexpected weight change.  HENT: Negative.  Negative for ear pain, sore throat, rhinorrhea and neck pain.   Eyes: Negative.   Respiratory: Positive for shortness of breath and wheezing. Negative for apnea, cough, hemoptysis, sputum production, choking, chest tightness and stridor.   Cardiovascular: Positive for chest pain (intermittent tightness with exertion and at rest). Negative for palpitations, orthopnea, claudication, leg swelling, syncope and PND.  Gastrointestinal: Negative.  Negative for nausea, vomiting, abdominal pain, diarrhea and constipation.  Endocrine: Negative.   Genitourinary: Negative.   Musculoskeletal: Negative.   Skin: Negative.  Negative for rash.  Allergic/Immunologic: Negative.   Neurological: Negative for dizziness, tremors, seizures, syncope, facial asymmetry, speech difficulty, weakness, light-headedness, numbness and headaches.  Hematological: Negative.  Negative for adenopathy. Does not bruise/bleed easily.   Psychiatric/Behavioral: Negative.        Objective:   Physical Exam  Vitals reviewed. Constitutional: He is oriented to person, place, and time. He appears well-developed and well-nourished. No distress.  HENT:  Head: Normocephalic and atraumatic.  Mouth/Throat: Oropharynx is clear and moist. No oropharyngeal exudate.  Eyes: Conjunctivae are normal. Right eye exhibits no discharge. Left eye exhibits no discharge. No scleral icterus.  Neck: Normal range of motion. Neck supple. No JVD present. No tracheal deviation present. No thyromegaly present.  Cardiovascular: Normal rate, regular rhythm, normal heart sounds and intact distal pulses.  Exam reveals no gallop and no friction rub.   No murmur heard. Pulmonary/Chest: Effort normal and breath sounds normal. No stridor. No respiratory distress. He has no wheezes. He has no rales. He exhibits no tenderness.  Abdominal: Soft. Bowel sounds are normal. He exhibits no distension and no mass. There is no tenderness. There is no rebound and no guarding.  Musculoskeletal: Normal range of motion. He exhibits no edema and no tenderness.  Lymphadenopathy:    He has no cervical adenopathy.  Neurological: He is oriented to person, place, and time.  Skin: Skin is warm and dry. No rash noted. He is not diaphoretic. No erythema. No pallor.  Psychiatric: He has a normal mood and affect. His behavior is normal. Judgment and thought content normal.      Lab Results  Component Value Date   WBC 5.0 11/16/2011   HGB 15.1 11/16/2011   HCT 42.6 11/16/2011   PLT 196 11/16/2011   GLUCOSE 101* 11/16/2011   CHOL 165 08/24/2010   TRIG 134.0 08/24/2010   HDL 53.70 08/24/2010   LDLCALC 85 08/24/2010   ALT 14 11/16/2011   AST 17 11/16/2011   NA 138 11/16/2011   K 4.3 11/16/2011   CL 106 11/16/2011   CREATININE 0.93  11/16/2011   BUN 10 11/16/2011   CO2 25 11/16/2011   TSH 1.91 06/07/2011   PSA 0.18 08/24/2010   INR 0.92 10/03/2009   HGBA1C 5.3 06/07/2011      Assessment &  Plan:

## 2012-07-14 NOTE — Assessment & Plan Note (Signed)
He will start symbicort Also, I have asked him to see Pulm to consider getting PFT's done

## 2012-07-25 ENCOUNTER — Encounter: Payer: Self-pay | Admitting: Internal Medicine

## 2012-07-25 ENCOUNTER — Ambulatory Visit (INDEPENDENT_AMBULATORY_CARE_PROVIDER_SITE_OTHER): Payer: 59 | Admitting: Internal Medicine

## 2012-07-25 VITALS — BP 148/78 | HR 86 | Temp 96.7°F | Ht 68.0 in | Wt 192.4 lb

## 2012-07-25 DIAGNOSIS — R0600 Dyspnea, unspecified: Secondary | ICD-10-CM

## 2012-07-25 DIAGNOSIS — R0609 Other forms of dyspnea: Secondary | ICD-10-CM

## 2012-07-25 DIAGNOSIS — R0989 Other specified symptoms and signs involving the circulatory and respiratory systems: Secondary | ICD-10-CM

## 2012-07-25 MED ORDER — DEXLANSOPRAZOLE 60 MG PO CPDR: DELAYED_RELEASE_CAPSULE | ORAL | Status: DC

## 2012-07-25 NOTE — Progress Notes (Deleted)
  Subjective:    Patient ID: [name] Page, male    DOB: 05/25/1950, 62 y.o.   MRN: [id number]  HPI    Review of Systems     Objective:   Physical Exam        Assessment & Plan:   

## 2012-07-25 NOTE — Progress Notes (Deleted)
  Subjective:    Patient ID: [name] Page, male    DOB: 1951/03/31, 62 y.o.   MRN: [id number]  HPI    Review of Systems     Objective:   Physical Exam        Assessment & Plan:

## 2012-07-25 NOTE — Progress Notes (Signed)
  Subjective:    Patient ID: [name] Page, male    DOB: 04-Nov-1950 MRN: [id number]  HPI  48 yowm PA passive smoker no resp illness as child and breathing fine until  Around 2000 started noticing some year round sneezing and sense of throat drainage  Then doe since 2011 with RLL basilar segmentectomy for carcinoid referred 07/25/2012 by Dr Yetta Barre for pulmonary eval for sob    07/25/2012 1st pulmonary ov cc ex tol has been decreaesed since 2011 with 20-30 lb gain and esp sob x 3 months with activities he was not doing previously like fast paced walking x 20-30 min..  ? Asbestos exp at home in ceiling tile.  occ noct wheeze, min dry cough not convinced better with symbicort or allegra  No obvious daytime variabilty or assoc chronic cough or cp or chest tightness, subjective wheeze overt sinus or hb symptoms. No unusual exp hx or h/o childhood pna/ asthma or premature birth to his knowledge.   Sleeping ok without nocturnal  or early am exacerbation  of respiratory  c/o's or need for noct saba. Also denies any obvious fluctuation of symptoms with weather or environmental changes or other aggravating or alleviating factors except as outlined above     Review of Systems  Constitutional: Positive for unexpected weight change. Negative for fever, chills, activity change and appetite change.  HENT: Negative for congestion, sore throat, rhinorrhea, sneezing, trouble swallowing, dental problem, voice change and postnasal drip.   Eyes: Negative for visual disturbance.  Respiratory: Positive for cough and shortness of breath. Negative for choking.   Cardiovascular: Negative for chest pain and leg swelling.  Gastrointestinal: Negative for nausea, vomiting and abdominal pain.  Genitourinary: Negative for difficulty urinating.  Musculoskeletal: Negative for arthralgias.  Skin: Negative for rash.  Psychiatric/Behavioral: Negative for behavioral problems and confusion.       Objective:   Physical  Exam  Pleasant healthy appearing amb wm nad Wt Readings from Last 3 Encounters:  07/25/12 192 lb 6.4 oz (87.272 kg)  07/14/12 188 lb 12 oz (85.616 kg)  02/16/12 183 lb (83.008 kg)    HEENT: nl dentition, turbinates, and orophanx. Nl external ear canals without cough reflex   NECK :  without JVD/Nodes/TM/ nl carotid upstrokes bilaterally   LUNGS: no acc muscle use, clear to A and P bilaterally without cough on insp or exp maneuvers   CV:  RRR  no s3 or murmur or increase in P2, no edema   ABD:  soft and nontender with nl excursion in the supine position. No bruits or organomegaly, bowel sounds nl  MS:  warm without deformities, calf tenderness, cyanosis or clubbing  SKIN: warm and dry without lesions    NEURO:  alert, approp, no deficits   cxr 07/14/12 No acute finding. Postoperative change right lower lobe as  described.      Assessment & Plan:

## 2012-07-25 NOTE — Patient Instructions (Addendum)
Add dexilant 60 mg Take 30-60 min before first meal of the day and Pepcid 20 mg one at bedtime  GERD (REFLUX)  is an extremely common cause of respiratory symptoms, many times with no significant heartburn at all.    It can be treated with medication, but also with lifestyle changes including avoidance of late meals, excessive alcohol, smoking cessation, and avoid fatty foods, chocolate, peppermint, colas, red wine, and acidic juices such as orange juice.  NO MINT OR MENTHOL PRODUCTS SO NO COUGH DROPS  USE SUGARLESS CANDY INSTEAD (jolley ranchers or Stover's)  NO OIL BASED VITAMINS - use powdered substitutes.   Please schedule a follow up office visit in 4 weeks, sooner if needed with pfts

## 2012-07-27 NOTE — Assessment & Plan Note (Addendum)
-   07/25/2012  Walked RA x 3 laps @ 185 ft each stopped due to end of study, sat 95% (s change) pulse 106   Symptoms are markedly disproportionate to objective findings and not clear this is a lung problem but pt does appear to have difficult airway management issues.   DDX of  difficult airways managment all start with A and  include Adherence, Ace Inhibitors, Acid Reflux, Active Sinus Disease, Alpha 1 Antitripsin deficiency, Anxiety masquerading as Airways dz,  ABPA,  allergy(esp in young), Aspiration (esp in elderly), Adverse effects of DPI,  Active smokers, plus two Bs  = Bronchiectasis and Beta blocker use..and one C= CHF   ? Acid reflux > always difficult to exclude, esp with wt gain > rx dexilant and return for pft's  ? Allergies/ asthma > doubt since not relieved with symbicort, though he most likely does have underlying rhinitis, though not seasonal  ? Anxiety> always dx of exclusion, doubt it here because symptoms occur proportionate to exercise.  See instructions for specific recommendations which were reviewed directly with the patient who was given a copy with highlighter outlining the key components.

## 2012-08-01 ENCOUNTER — Ambulatory Visit (HOSPITAL_COMMUNITY): Payer: 59 | Attending: Internal Medicine | Admitting: Radiology

## 2012-08-01 VITALS — BP 135/87 | HR 70 | Ht 68.0 in | Wt 186.0 lb

## 2012-08-01 DIAGNOSIS — R002 Palpitations: Secondary | ICD-10-CM | POA: Insufficient documentation

## 2012-08-01 DIAGNOSIS — R079 Chest pain, unspecified: Secondary | ICD-10-CM

## 2012-08-01 DIAGNOSIS — R5383 Other fatigue: Secondary | ICD-10-CM | POA: Insufficient documentation

## 2012-08-01 DIAGNOSIS — R61 Generalized hyperhidrosis: Secondary | ICD-10-CM | POA: Insufficient documentation

## 2012-08-01 DIAGNOSIS — I4510 Unspecified right bundle-branch block: Secondary | ICD-10-CM | POA: Insufficient documentation

## 2012-08-01 DIAGNOSIS — R0600 Dyspnea, unspecified: Secondary | ICD-10-CM

## 2012-08-01 DIAGNOSIS — R9431 Abnormal electrocardiogram [ECG] [EKG]: Secondary | ICD-10-CM

## 2012-08-01 DIAGNOSIS — R0789 Other chest pain: Secondary | ICD-10-CM | POA: Insufficient documentation

## 2012-08-01 DIAGNOSIS — R0602 Shortness of breath: Secondary | ICD-10-CM | POA: Insufficient documentation

## 2012-08-01 DIAGNOSIS — R5381 Other malaise: Secondary | ICD-10-CM | POA: Insufficient documentation

## 2012-08-01 MED ORDER — TECHNETIUM TC 99M SESTAMIBI GENERIC - CARDIOLITE
10.0000 | Freq: Once | INTRAVENOUS | Status: AC | PRN
  Administered 2012-08-01: 10 via INTRAVENOUS

## 2012-08-01 MED ORDER — TECHNETIUM TC 99M SESTAMIBI GENERIC - CARDIOLITE
30.0000 | Freq: Once | INTRAVENOUS | Status: AC | PRN
  Administered 2012-08-01: 30 via INTRAVENOUS

## 2012-08-01 NOTE — Progress Notes (Signed)
[facility] SITE 3 NUCLEAR MED [address] [place], [place] [address] [phone]    Cardiology Nuclear Med Study  [name] Page is a 63 y.o. male     MRN : [id number]     DOB: [birth year]  Procedure Date: 08/01/2012  Nuclear Med Background Indication for Stress Test:  Evaluation for Ischemia History:  '90's GEX:BMWUXL; '06 Cath:normal coronaries, EF65% Cardiac Risk Factors:IRBBB  Symptoms:  Chest Tightness with  And without Exertion (last episode of chest discomfort was about 2-weeks ago), Diaphoresis, Fatigue, Palpitations and SOB   Nuclear Pre-Procedure Caffeine/Decaff Intake:  None > 12 hrs NPO After: 5:00am   Lungs:  Clear. O2 Sat: 98% on room air. IV 0.9% NS with Angio Cath:  22g  IV Site: R Antecubital x 1, tolerated well IV Started by:  Irean Hong, RN  Chest Size (in):  44 Cup Size: n/a  Height: 5\' 8"  (1.727 m)  Weight:  186 lb (84.369 kg)  BMI:  Body mass index is 28.29 kg/(m^2). Tech Comments:  Took morning medications     Nuclear Med Study 1 or 2 day study: 1 day  Stress Test Type:  Stress  Reading MD: Willa Rough, MD  Order Authorizing Provider: Sanda Linger, MD  Resting Radionuclide: Technetium 21m Sestamibi  Resting Radionuclide Dose: 10.9 mCi   Stress Radionuclide:  Technetium 70m Sestamibi  Stress Radionuclide Dose: 33.0 mCi           Stress Protocol Rest HR: 70 Stress HR: 142  Rest BP: 135/87 Stress BP: 198/79  Exercise Time (min): 10:30 METS: 12.3   Predicted Max HR: 158 bpm % Max HR: 89.87 bpm Rate Pressure Product: 24401   Dose of Adenosine (mg):  n/a Dose of Lexiscan: n/a mg  Dose of Atropine (mg): n/a Dose of Dobutamine: n/a mcg/kg/min (at max HR)  Stress Test Technologist: Smiley Houseman, CMA-N  Nuclear Technologist:  Domenic Polite, CNMT     Rest Procedure:  Myocardial perfusion imaging was performed at rest 45 minutes following the intravenous administration of Technetium 35m Sestamibi.  Rest ECG: Incomplete right  bundle-branch block  Stress Procedure:  The patient exercised on the treadmill utilizing the Bruce Protocol for 10:30 minutes. The patient stopped due to fatigue and denied any chest pain.  Technetium 32m Sestamibi was injected at peak exercise and myocardial perfusion imaging was performed after a brief delay.  Stress ECG: No significant change from baseline ECG  QPS Raw Data Images:  Normal; no motion artifact; normal heart/lung ratio. Stress Images:  Normal homogeneous uptake in all areas of the myocardium. Rest Images:  Normal homogeneous uptake in all areas of the myocardium. Subtraction (SDS):  No evidence of ischemia. Transient Ischemic Dilatation (Normal <1.22):  1.00 Lung/Heart Ratio (Normal <0.45):  0.38  Quantitative Gated Spect Images QGS EDV:  71 ml QGS ESV:  20 ml  Impression Exercise Capacity:  Very good exercise capacity. BP Response:  Normal blood pressure response. Clinical Symptoms:  No significant symptoms noted. ECG Impression:  No significant ST segment change suggestive of ischemia. Comparison with Prior Nuclear Study: No previous nuclear study performed  Overall Impression:  Normal stress nuclear study.   There is no significant abnormality noted.  LV Ejection Fraction: 73%.  LV Wall Motion:   Normal wall motion.  Willa Rough, MD

## 2012-08-11 ENCOUNTER — Ambulatory Visit (INDEPENDENT_AMBULATORY_CARE_PROVIDER_SITE_OTHER): Payer: 59 | Admitting: Internal Medicine

## 2012-08-11 ENCOUNTER — Encounter: Payer: Self-pay | Admitting: Internal Medicine

## 2012-08-11 VITALS — BP 158/80 | HR 76 | Temp 98.0°F | Resp 16 | Wt 188.0 lb

## 2012-08-11 DIAGNOSIS — L0211 Cutaneous abscess of neck: Secondary | ICD-10-CM

## 2012-08-11 MED ORDER — HYDROCODONE-ACETAMINOPHEN 5-325 MG PO TABS: 1.0000 | ORAL_TABLET | Freq: Two times a day (BID) | ORAL | Status: DC | PRN

## 2012-08-11 MED ORDER — MUPIROCIN 2 % EX OINT: TOPICAL_OINTMENT | CUTANEOUS | Status: DC

## 2012-08-11 MED ORDER — DOXYCYCLINE HYCLATE 100 MG PO TABS: 100.0000 mg | ORAL_TABLET | Freq: Two times a day (BID) | ORAL | Status: DC

## 2012-08-11 NOTE — Progress Notes (Signed)
  Subjective:    Patient ID: [name] Page, male    DOB: 1950-11-17, 62 y.o.   MRN: [id number]  HPI  C/o abscess on R post neck x several days  Review of Systems  Constitutional: Negative for fever.  HENT: Positive for neck pain.   Skin: Positive for rash (post neck abscess).       Objective:   Physical Exam  Constitutional: He is oriented to person, place, and time. He appears well-developed. No distress.  HENT:  Mouth/Throat: Oropharynx is clear and moist.  Eyes: Conjunctivae are normal. Pupils are equal, round, and reactive to light.  Neck: Normal range of motion. No JVD present. No tracheal deviation present.  Cardiovascular: Normal rate, regular rhythm, normal heart sounds and intact distal pulses.   Pulmonary/Chest: Effort normal and breath sounds normal.  Abdominal: Soft. Bowel sounds are normal. He exhibits distension.  Musculoskeletal: Normal range of motion.  Lymphadenopathy:    He has no cervical adenopathy.  Neurological: He is alert and oriented to person, place, and time. He has normal reflexes.  Skin: Skin is warm and dry. Rash noted. There is erythema.  2x3 cm post neck abscess, tender and red  Psychiatric: He has a normal mood and affect. His behavior is normal. Judgment and thought content normal.    Procedure note:  Incision and Drainage of an Abscess   Indication : a localized collection of pus on post neck that is tender and not spontaneously resolving.    Risks including unsuccessful procedure , possible need for a repeat procedure due to pus accumulation, scar formation, and others as well as benefits were explained to the patient in detail. Written consent was obtained/signed.    The patient was placed in a decubitus position. The area of an abscess was prepped with povidone-iodine and draped in a sterile fashion. Local anesthesia with   2    cc of 2% lidocaine and epinephrine  was administered.  1 cm incision with #11strait blade was made. About 3  cc of purulent material was expressed. The abscess cavity was explored with a sterile hemostat and the walled- off pockets and septae were broken down bluntly. The cavity was irrigated with the rest of the anesthetic in the syringe and packed with 3 inches of  the iodoform gauze.   The wound was dressed with antibiotic ointment and Telfa pad.  Tolerated well. Complications: None.         Assessment & Plan:

## 2012-08-11 NOTE — Patient Instructions (Addendum)

## 2012-08-13 ENCOUNTER — Encounter: Payer: Self-pay | Admitting: Internal Medicine

## 2012-08-13 DIAGNOSIS — L0211 Cutaneous abscess of neck: Secondary | ICD-10-CM | POA: Insufficient documentation

## 2012-08-13 NOTE — Assessment & Plan Note (Signed)
5/14 posterior - infected sebaceous cyst [place] will need I&D Doxy x 10 d Bactroban oint

## 2012-08-28 ENCOUNTER — Other Ambulatory Visit: Payer: Self-pay | Admitting: Internal Medicine

## 2012-09-07 ENCOUNTER — Ambulatory Visit: Payer: 59 | Admitting: Internal Medicine

## 2012-10-04 ENCOUNTER — Ambulatory Visit (INDEPENDENT_AMBULATORY_CARE_PROVIDER_SITE_OTHER): Payer: 59 | Admitting: Internal Medicine

## 2012-10-04 ENCOUNTER — Encounter: Payer: Self-pay | Admitting: Internal Medicine

## 2012-10-04 VITALS — BP 120/80 | HR 79 | Temp 98.0°F | Ht 67.5 in | Wt 188.0 lb

## 2012-10-04 DIAGNOSIS — R0600 Dyspnea, unspecified: Secondary | ICD-10-CM

## 2012-10-04 DIAGNOSIS — R0989 Other specified symptoms and signs involving the circulatory and respiratory systems: Secondary | ICD-10-CM

## 2012-10-04 DIAGNOSIS — R0609 Other forms of dyspnea: Secondary | ICD-10-CM

## 2012-10-04 DIAGNOSIS — K219 Gastro-esophageal reflux disease without esophagitis: Secondary | ICD-10-CM

## 2012-10-04 LAB — PULMONARY FUNCTION TEST

## 2012-10-04 NOTE — Progress Notes (Signed)
  Subjective:    Patient ID: EDU ON, male    DOB: 1950-12-15   MRN: [id number]    Brief patient profile:  89 yowm PA passive smoker no resp illness as child and breathing fine until  Around 2000 started noticing some year round sneezing and sense of throat drainage  Then doe since 2011 with RLL basilar segmentectomy for carcinoid referred 07/25/2012 by Dr Yetta Barre for pulmonary eval for sob   HPI 07/25/2012 1st pulmonary ov cc ex tol has been decreaesed since 2011 with 20-30 lb gain and esp sob x 3 months with activities he was not doing previously like fast paced walking x 20-30 min..  ? Asbestos exp at home in ceiling tile.  occ noct wheeze, min dry cough not convinced better with symbicort or allegra rec Add dexilant 60 mg Take 30-60 min before first meal of the day and Pepcid 20 mg one at bedtime GERD diet  10/04/2012 f/u ov/Zaira Iacovelli  Chief Complaint  Patient presents with  . Follow-up    SOB at rest and wheezing have improved. Still has some occ DOE.    off symbicort completely, pattern of sob only now with heavy exertion.   No obvious daytime variabilty or assoc chronic cough or cp or chest tightness, subjective wheeze overt sinus or hb symptoms. No unusual exp hx or h/o childhood pna/ asthma or premature birth to his knowledge.   Sleeping ok without nocturnal  or early am exacerbation  of respiratory  c/o's or need for noct saba. Also denies any obvious fluctuation of symptoms with weather or environmental changes or other aggravating or alleviating factors except as outlined above   Current Medications, Allergies, Past Medical History, Past Surgical History, Family History, and Social History were reviewed in [employer] record.  ROS  The following are not active complaints unless bolded sore throat, dysphagia, dental problems, itching, sneezing,  nasal congestion or excess/ purulent secretions, ear ache,   fever, chills, sweats, unintended wt loss, pleuritic  or exertional cp, hemoptysis,  orthopnea pnd or leg swelling, presyncope, palpitations, heartburn, abdominal pain, anorexia, nausea, vomiting, diarrhea  or change in bowel or urinary habits, change in stools or urine, dysuria,hematuria,  rash, arthralgias, visual complaints, headache, numbness weakness or ataxia or problems with walking or coordination,  change in mood/affect or memory.              Objective:   Physical Exam  Pleasant healthy appearing amb wm nad  10/04/12             188 Wt Readings from Last 3 Encounters:  07/25/12 192 lb 6.4 oz (87.272 kg)  07/14/12 188 lb 12 oz (85.616 kg)  02/16/12 183 lb (83.008 kg)    HEENT: nl dentition, turbinates, and orophanx. Nl external ear canals without cough reflex   NECK :  without JVD/Nodes/TM/ nl carotid upstrokes bilaterally   LUNGS: no acc muscle use, clear to A and P bilaterally without cough on insp or exp maneuvers   CV:  RRR  no s3 or murmur or increase in P2, no edema   ABD:  soft and nontender with nl excursion in the supine position. No bruits or organomegaly, bowel sounds nl  MS:  warm without deformities, calf tenderness, cyanosis or clubbing      cxr 07/14/12 No acute finding. Postoperative change right lower lobe as  described.      Assessment & Plan:

## 2012-10-04 NOTE — Progress Notes (Signed)
PFT done today. 

## 2012-10-04 NOTE — Patient Instructions (Addendum)
Weight control is simply a matter of calorie balance which needs to be tilted in your favor by eating less and exercising more.  To get the most out of exercise, you need to be continuously aware that you are short of breath, but never out of breath, for 30 minutes daily. As you improve, it will actually be easier for you to do the same amount of exercise  in  30 minutes so always push to the level where you are short of breath.  If this does not result in gradual weight reduction then I strongly recommend you see a nutritionist with a food diary x 2 weeks so that we can work out a negative calorie balance which is universally effective in steady weight loss programs.  Think of your calorie balance like you do your bank account where in this case you want the balance to go down so you must take in less calories than you burn up.  It's just that simple:  Hard to do, but easy to understand.  Good luck!   Pepcid 20 mg after meals and at bedtime plus reflux diet (aciphex might be another choice for am ppi)    If you are satisfied with your treatment plan let your doctor know and he/she can either refill your medications or you can return here when your prescription runs out.     If in any way you are not 100% satisfied,  please tell us.  If 100% better, tell your friends!

## 2012-10-07 NOTE — Assessment & Plan Note (Signed)
Clinical dx assoc with wt gain > reviewed options for longterm rx but key is keep wt down    Each maintenance medication was reviewed in detail including most importantly the difference between maintenance and as needed and under what circumstances the prns are to be used.  Please see instructions for details which were reviewed in writing and the patient given a copy.

## 2012-10-07 NOTE — Assessment & Plan Note (Addendum)
-   07/25/2012  Walked RA x 3 laps @ 185 ft each stopped due to end of study, sat 95% (s change) pulse 106  - 10/04/2012 PFT's wnl including mid flows  Much better on rx for gerd and off symbicort with nl pft's including midflows so strongly doubt this is asthma.  Most likely though does have some deconditioning related to wt gain > reviewed strategy to correct this problem  Pulmonary f/u can be prn

## 2012-11-14 ENCOUNTER — Other Ambulatory Visit (HOSPITAL_BASED_OUTPATIENT_CLINIC_OR_DEPARTMENT_OTHER): Payer: 59 | Admitting: Lab

## 2012-11-14 DIAGNOSIS — D3A00 Benign carcinoid tumor of unspecified site: Secondary | ICD-10-CM

## 2012-11-14 LAB — CBC WITH DIFFERENTIAL/PLATELET
BASO%: 0.5 % (ref 0.0–2.0)
LYMPH%: 27.7 % (ref 14.0–49.0)
MCHC: 35.8 g/dL (ref 32.0–36.0)
MONO#: 0.8 10*3/uL (ref 0.1–0.9)
MONO%: 11.6 % (ref 0.0–14.0)
NEUT#: 4.2 10*3/uL (ref 1.5–6.5)
Platelets: 196 10*3/uL (ref 140–400)
RBC: 4.6 10*6/uL (ref 4.20–5.82)
RDW: 11.8 % (ref 11.0–14.6)
WBC: 7.3 10*3/uL (ref 4.0–10.3)

## 2012-11-14 LAB — COMPREHENSIVE METABOLIC PANEL (CC13)
ALT: 28 U/L (ref 0–55)
Albumin: 3.9 g/dL (ref 3.5–5.0)
Alkaline Phosphatase: 77 U/L (ref 40–150)
CO2: 25 mEq/L (ref 22–29)
Potassium: 4.5 mEq/L (ref 3.5–5.1)
Sodium: 140 mEq/L (ref 136–145)
Total Bilirubin: 0.99 mg/dL (ref 0.20–1.20)
Total Protein: 6.8 g/dL (ref 6.4–8.3)

## 2012-11-21 ENCOUNTER — Telehealth: Payer: Self-pay | Admitting: Internal Medicine

## 2012-11-21 ENCOUNTER — Other Ambulatory Visit: Payer: Self-pay | Admitting: *Deleted

## 2012-11-21 ENCOUNTER — Ambulatory Visit: Payer: PRIVATE HEALTH INSURANCE | Admitting: Internal Medicine

## 2012-11-21 NOTE — Telephone Encounter (Signed)
s.w. pt and advised on ct and est appts...pt ok and aware

## 2012-11-24 ENCOUNTER — Other Ambulatory Visit: Payer: Self-pay | Admitting: Internal Medicine

## 2012-11-27 ENCOUNTER — Other Ambulatory Visit: Payer: Self-pay | Admitting: Internal Medicine

## 2012-11-28 ENCOUNTER — Ambulatory Visit (HOSPITAL_COMMUNITY)
Admission: RE | Admit: 2012-11-28 | Discharge: 2012-11-28 | Disposition: A | Discharge status: Home / Self Care | Payer: 59 | Source: Ambulatory Visit | Attending: Internal Medicine | Admitting: Internal Medicine

## 2012-11-28 DIAGNOSIS — C3490 Malignant neoplasm of unspecified part of unspecified bronchus or lung: Secondary | ICD-10-CM | POA: Insufficient documentation

## 2012-11-28 DIAGNOSIS — R079 Chest pain, unspecified: Secondary | ICD-10-CM | POA: Insufficient documentation

## 2012-11-29 ENCOUNTER — Other Ambulatory Visit: Payer: Self-pay | Admitting: *Deleted

## 2012-11-30 ENCOUNTER — Ambulatory Visit: Payer: 59 | Admitting: Internal Medicine

## 2012-11-30 ENCOUNTER — Telehealth: Payer: Self-pay | Admitting: Internal Medicine

## 2012-11-30 NOTE — Telephone Encounter (Signed)
lvm for pt regarding to 8.28.14 appt cx and moved to 9.8.14 due to Dr. Kerry Fort being sick.

## 2012-12-11 ENCOUNTER — Telehealth: Payer: Self-pay | Admitting: Internal Medicine

## 2012-12-11 ENCOUNTER — Ambulatory Visit (HOSPITAL_BASED_OUTPATIENT_CLINIC_OR_DEPARTMENT_OTHER): Payer: 59 | Admitting: Internal Medicine

## 2012-12-11 ENCOUNTER — Encounter: Payer: Self-pay | Admitting: Internal Medicine

## 2012-12-11 DIAGNOSIS — Z85118 Personal history of other malignant neoplasm of bronchus and lung: Secondary | ICD-10-CM

## 2012-12-11 NOTE — Patient Instructions (Signed)
Follow up visit in one year. 

## 2012-12-11 NOTE — Progress Notes (Signed)
[facility] Telephone:([phone]) [phone]   Fax:([phone]) [phone]  OFFICE PROGRESS NOTE  Illene Regulus, MD [address] 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  INTERVAL HISTORY: [name] Page 63 y.o. male returns to the clinic today for annual followup visit. The patient is feeling fine today with no specific complaints. He denied having any significant chest pain, shortness breath, cough or hemoptysis. He denied having any weight loss or night sweats. He has repeat CT scan of the chest performed recently and he is here for evaluation and discussion of his scan results.  MEDICAL HISTORY: Past Medical History  Diagnosis Date  . Disturbance of skin sensation   . Other diseases of lung, not elsewhere classified   . Other abnormal blood chemistry   . Other testicular hypofunction   . Depressive disorder, not elsewhere classified   . Esophageal reflux   . Allergic rhinitis, cause unspecified   . Personal history of urinary calculi   . Personal history of other diseases of digestive system     ALLERGIES:  is allergic to bee venom; flagyl; and nsaids.  MEDICATIONS:  Current Outpatient Prescriptions  Medication Sig Dispense Refill  . aspirin 325 MG tablet Take 1 tablet (325 mg total) by mouth daily.  90 tablet  1  . b complex vitamins tablet Take 1 tablet by mouth daily.        Marland Kitchen buPROPion (WELLBUTRIN XL) 300 MG 24 hr tablet TAKE 1 TABLET BY MOUTH DAILY.  30 tablet  3  . Cholecalciferol (VITAMIN D) 2000 UNITS CAPS Take 1 capsule by mouth daily.       . clonazePAM (KLONOPIN) 0.5 MG tablet Take 1 tablet (0.5 mg total) by mouth 2 (two) times daily as needed.  60 tablet  5  . diazepam (VALIUM) 2 MG tablet Take 1 tablet (2 mg total) by mouth every 6 (six) hours as needed for  anxiety.  30 tablet  0  . EPINEPHrine (EPIPEN) 0.3 mg/0.3 mL DEVI Inject 0.3 mLs (0.3 mg total) into the muscle once.  2 Device  2  . famotidine (PEPCID) 20 MG tablet Take 20 mg by mouth 2 (two) times daily.      . fexofenadine (ALLEGRA) 180 MG tablet Take 180 mg by mouth daily as needed.       . gabapentin (NEURONTIN) 300 MG capsule       . gabapentin (NEURONTIN) 300 MG capsule TAKE ONE CAPSULE BY MOUTH TWICE A DAY AND 2 CAPSULES AT BEDTIME  120 capsule  3  . HYDROcodone-acetaminophen (NORCO/VICODIN) 5-325 MG per tablet Take 1 tablet by mouth 2 (two) times daily as needed for pain.  30 tablet  0  . MULTIPLE VITAMIN PO Take 1 tablet by mouth daily.       . nitroGLYCERIN (NITROSTAT) 0.3 MG SL tablet Place 1 tablet (0.3 mg total) under the tongue 4 (four) times daily as needed for chest pain.  90 tablet  0   No current facility-administered medications for this visit.    SURGICAL HISTORY:  Past Surgical History  Procedure Laterality Date  . Parotid tumor surgery    . Tonsillectomy and adenoidectomy    . Minithoractomy with partial lobectomy for pulmonary carcinoid  6-11    [name]    REVIEW OF SYSTEMS:  A comprehensive review of systems was negative.   PHYSICAL EXAMINATION: General appearance: alert, cooperative and no distress Head: Normocephalic, without obvious abnormality, atraumatic Neck: no adenopathy Lymph nodes: Cervical, supraclavicular, and axillary nodes normal. Resp: clear to auscultation bilaterally Cardio: regular rate and rhythm, S1, S2 normal, no murmur, click, rub or gallop GI: soft, non-tender; bowel sounds normal; no masses,  no organomegaly Extremities: extremities normal, atraumatic, no cyanosis or edema  ECOG PERFORMANCE STATUS: 0 - Asymptomatic  Blood pressure 138/91, pulse 72, temperature 97 F (36.1 C), temperature source Oral, resp. rate 20, height 5' 7.5" (1.715 m), weight 191 lb 8 oz (86.864 kg).  LABORATORY DATA: Lab Results  Component Value Date    WBC 7.3 11/14/2012   HGB 15.4 11/14/2012   HCT 43.0 11/14/2012   MCV 93.3 11/14/2012   PLT 196 11/14/2012      Chemistry      Component Value Date/Time   NA 140 11/14/2012 1004   NA 136 07/14/2012 1123   K 4.5 11/14/2012 1004   K 4.5 07/14/2012 1123   CL 101 07/14/2012 1123   CO2 25 11/14/2012 1004   CO2 29 07/14/2012 1123   BUN 12.5 11/14/2012 1004   BUN 14 07/14/2012 1123   CREATININE 1.0 11/14/2012 1004   CREATININE 1.0 07/14/2012 1123      Component Value Date/Time   CALCIUM 9.4 11/14/2012 1004   CALCIUM 9.3 07/14/2012 1123   ALKPHOS 77 11/14/2012 1004   ALKPHOS 70 07/14/2012 1123   AST 24 11/14/2012 1004   AST 37 07/14/2012 1123   ALT 28 11/14/2012 1004   ALT 56* 07/14/2012 1123   BILITOT 0.99 11/14/2012 1004   BILITOT 1.0 07/14/2012 1123       RADIOGRAPHIC STUDIES: Ct Chest Wo Contrast  11/28/2012   *RADIOLOGY REPORT*  Clinical Data: Lung cancer.  Restaging scan.  Right-sided chest pain.  CT CHEST WITHOUT CONTRAST  Technique:  Multidetector CT imaging of the chest was performed following the standard protocol without IV contrast.  Comparison: Chest c t 11/16/2011.  Findings:  Mediastinum: Heart size is normal. There is no significant pericardial fluid, thickening or pericardial calcification. No pathologically enlarged mediastinal or hilar lymph nodes. Please note that accurate exclusion of hilar adenopathy is limited on noncontrast CT scans.  Esophagus is unremarkable in appearance.  Lungs/Pleura: Postoperative changes of wedge resection are noted in the anterior aspect of the right lower lobe.  No suspicious appearing pulmonary nodules or masses are identified on today's examination.  Linear opacities in the inferior segment of the lingula on the lateral aspect of the left lower lobe are unchanged and compatible with areas of mild scarring.  No acute consolidative airspace disease.  No pleural effusions.  Upper Abdomen: Unremarkable.  Musculoskeletal: Post thoracotomy changes in the right  hemithorax. There are no aggressive appearing lytic or blastic lesions noted in the visualized portions of the skeleton.  IMPRESSION: 1.  Status post right lower lobe wedge resection without evidence to suggest local recurrence of disease or new metastatic disease in the thorax. 2.  No acute findings.   Original Report Authenticated By: Trudie Reed, M.D.    ASSESSMENT AND PLAN: This is a very pleasant 62 years old white male with history of stage IA pulmonary carcinoid tumor status post right lower lobe segmentectomy with lymph node dissection has been observation since July 2007 was no evidence for disease recurrence. I discussed the scan results with the patient and his wife. I recommended for him to continue  on observation with repeat CT scan of the chest without contrast in one year.  He was advised to call immediately if he has any concerning symptoms in the interval.  The patient voices understanding of current disease status and treatment options and is in agreement with the current care plan.  All questions were answered. The patient knows to call the clinic with any problems, questions or concerns. We can certainly see the patient much sooner if necessary.

## 2012-12-11 NOTE — Telephone Encounter (Signed)
GV AND PRINTED APPT SCHED AND AVS FOR PT FOR sEPT 2015   °

## 2012-12-19 ENCOUNTER — Encounter: Payer: Self-pay | Admitting: Internal Medicine

## 2012-12-21 ENCOUNTER — Ambulatory Visit (INDEPENDENT_AMBULATORY_CARE_PROVIDER_SITE_OTHER): Payer: 59 | Admitting: Internal Medicine

## 2012-12-21 ENCOUNTER — Encounter: Payer: Self-pay | Admitting: Internal Medicine

## 2012-12-21 VITALS — BP 150/90 | HR 73 | Temp 97.2°F | Wt 187.0 lb

## 2012-12-21 DIAGNOSIS — M79609 Pain in unspecified limb: Secondary | ICD-10-CM

## 2012-12-21 DIAGNOSIS — M79671 Pain in right foot: Secondary | ICD-10-CM

## 2012-12-21 DIAGNOSIS — J069 Acute upper respiratory infection, unspecified: Secondary | ICD-10-CM

## 2012-12-21 MED ORDER — EPINEPHRINE 0.3 MG/0.3ML IJ SOAJ: 0.3000 mg | Freq: Once | INTRAMUSCULAR | Status: DC

## 2012-12-21 MED ORDER — AZITHROMYCIN 250 MG PO TABS: ORAL_TABLET | ORAL | Status: DC

## 2012-12-21 MED ORDER — GABAPENTIN 300 MG PO CAPS: 300.0000 mg | ORAL_CAPSULE | Freq: Three times a day (TID) | ORAL | Status: DC

## 2012-12-21 NOTE — Progress Notes (Signed)
Subjective:    Patient ID: [name] Page, male    DOB: 1950-10-30, 62 y.o.   MRN: [id number]  HPI URI - symptoms with cough, chills, sweats, no SOB, positive for facial pain and headache.  Foot pain - persistent neuropathy/foot pain. Gabapentin 900-1200mg  qHS helps get him a nights rest. Last B12 level MArch '13 was 900+. He reports that he will have paresthesia mid-thigh down and distal UE when he wakes up that will resolve through the day. He can get through the day wearing asics shoes or crocks that really help. He has seen Dr. Lestine Box in the past and has orthotics for his regular shoes that also help.  Past Medical History  Diagnosis Date  . Disturbance of skin sensation   . Other diseases of lung, not elsewhere classified   . Other abnormal blood chemistry   . Other testicular hypofunction   . Depressive disorder, not elsewhere classified   . Esophageal reflux   . Allergic rhinitis, cause unspecified   . Personal history of urinary calculi   . Personal history of other diseases of digestive system    Past Surgical History  Procedure Laterality Date  . Parotid tumor surgery    . Tonsillectomy and adenoidectomy    . 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 Early death Neg Hx   . COPD Neg Hx   . Heart disease Neg Hx   . Hypertension Neg Hx   . Stroke Neg Hx    History   Social History  . Marital Status: Married    Spouse Name: N/A    Number of Children: 0  . Years of Education: 28   Occupational History  . 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     Social History Main Topics  . Smoking status: Never Smoker   . Smokeless tobacco: Never Used  . Alcohol Use: Yes     Comment: rare occ  . Drug Use: No  . Sexual Activity: Yes    Partners: Female   Other Topics Concern  . Not on file   Social History Narrative   [employer], College grad, [employer]. 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 Married 30 + years. No children, lots of critters. As of July '13 testing out retirement! So far so good.    Current Outpatient Prescriptions on File Prior to Visit  Medication Sig Dispense Refill  . aspirin 325 MG tablet Take 1 tablet (325 mg total) by mouth daily.  90 tablet  1  . b complex vitamins tablet Take 1 tablet by mouth daily.        Marland Kitchen buPROPion (WELLBUTRIN XL) 300 MG 24 hr tablet TAKE 1 TABLET BY MOUTH DAILY.  30 tablet  3  . Cholecalciferol (VITAMIN D) 2000 UNITS CAPS Take 1 capsule by mouth daily.       . clonazePAM (KLONOPIN) 0.5 MG tablet Take 1 tablet (0.5 mg total) by mouth 2 (two) times daily as needed.  60 tablet  5  . diazepam (VALIUM) 2 MG tablet Take 1 tablet (2 mg total) by mouth every 6 (six) hours as needed for anxiety.  30 tablet  0  . famotidine (PEPCID) 20 MG tablet Take 20 mg by mouth 2 (two) times daily.      . fexofenadine (ALLEGRA) 180 MG tablet Take 180 mg by mouth daily as needed.       Marland Kitchen  gabapentin (NEURONTIN) 300 MG capsule TAKE ONE CAPSULE BY MOUTH TWICE A DAY AND 2 CAPSULES AT BEDTIME  120 capsule  3  . HYDROcodone-acetaminophen (NORCO/VICODIN) 5-325 MG per tablet Take 1 tablet by mouth 2 (two) times daily as needed for pain.  30 tablet  0  . MULTIPLE VITAMIN PO Take 1 tablet by mouth daily.        No current facility-administered medications on file prior to visit.       Review of Systems System review is negative for any constitutional, cardiac, pulmonary, GI or neuro symptoms or complaints other than as described in the HPI.     Objective:   Physical Exam Filed Vitals:   12/21/12 1116  BP: 150/90  Pulse: 73  Temp: 97.2 F (36.2 C)   Wt Readings from Last 3 Encounters:  12/22/12 185 lb (83.915 kg)  12/21/12 187 lb (84.823 kg)  12/11/12 191 lb 8 oz (86.864 kg)   Gen'l - WNWD white man in no distress HEENT- - normal exam Cor- RRR PUlm - normal respirations MSK - no visible deformity of feet.         Assessment & Plan:  URI - behaving like an atypical infection  Plan z-pak as directed  Sudafed 30 mg bid  Cough syrup of choice ( I like robitussin DM)  ecchinacea  Stove top vaporizer and saline wash.

## 2012-12-21 NOTE — Patient Instructions (Addendum)
1. URI - behaving like an atypical infection  Plan z-pak as directed  Sudafed 30 mg bid  Cough syrup of choice ( I like robitussin DM)  ecchinacea  Stove top vaporizer and saline wash.  2. Foot pain - may be both a neuropathy and a mechanical problem.  Plan See Dr. Terrilee Files ( a [facility] desciple) tomorrow  If not a mechanical problem a trial of duloxetine 30 - 60mg  daily may help (compatible with wellbutrin)

## 2012-12-22 ENCOUNTER — Encounter: Payer: Self-pay | Admitting: Family Medicine

## 2012-12-22 ENCOUNTER — Encounter: Payer: Self-pay | Admitting: Internal Medicine

## 2012-12-22 ENCOUNTER — Ambulatory Visit (INDEPENDENT_AMBULATORY_CARE_PROVIDER_SITE_OTHER): Payer: 59 | Admitting: Family Medicine

## 2012-12-22 VITALS — BP 144/92 | HR 83 | Wt 185.0 lb

## 2012-12-22 DIAGNOSIS — G609 Hereditary and idiopathic neuropathy, unspecified: Secondary | ICD-10-CM

## 2012-12-22 DIAGNOSIS — M79609 Pain in unspecified limb: Secondary | ICD-10-CM

## 2012-12-22 DIAGNOSIS — M79671 Pain in right foot: Secondary | ICD-10-CM | POA: Insufficient documentation

## 2012-12-22 MED ORDER — PREDNISONE 50 MG PO TABS: 50.0000 mg | ORAL_TABLET | Freq: Every day | ORAL | Status: DC

## 2012-12-22 MED ORDER — HYDROCODONE-ACETAMINOPHEN 5-325 MG PO TABS: 1.0000 | ORAL_TABLET | Freq: Two times a day (BID) | ORAL | Status: DC | PRN

## 2012-12-22 NOTE — Assessment & Plan Note (Signed)
Patient has significant breakdown of the metatarsal head. Patient does have custom orthotics already and did add a metatarsal pad to both of these. This will help support the transverse arch somewhat. In addition to this patient was given home exercise program that could be beneficial. We did discuss proper shoe wear. Discuss changing other activities such as standing on hard surfaces attempting to get Mats for his house. Patient will try prednisone as well to try to decrease the amount of fluid in the area around the tendon to see if this will be beneficial. Patient will come back in 3 weeks and depending on the amount of pain we'll either consider doing the Cam Walker versus other modalities.

## 2012-12-22 NOTE — Assessment & Plan Note (Signed)
Patient has had normal EMG as well as normal thyroid and B12 function. I am potentially concern that patient is not using B12 correctly. Potential he might need subcutaneous B12 injections to see if that might making improvement. Patient is on medications for his reflux disease that does decrease the amount of absorption. Thyroid functions were normal We'll continue to monitor.

## 2012-12-22 NOTE — Patient Instructions (Signed)
Very nice to meet you I think you have a stress reaction in your foot secondary to breakdown of your transverse arch.  Lets try 5 day burst of steroids.  Rigid sole shoes would be good.  Lets try ice baths 20 minutes 2 times a day I refilled your vicodin.  Try these exercises daily Come back in 3 weeks.

## 2012-12-22 NOTE — Progress Notes (Signed)
  I'm seeing this patient by the request  of:  Dr. Debby Bud  CC: Right foot pain  HPI: Patient is a very pleasant 62 year old gentleman coming in with a complaint of multiple month history of right foot pain. Patient does have a past medical history for a partial lobectomy for pulmonary carcinoid back in 2011. Patient states since that time he intermittently was having more of a poly-neuropathy of the lower extremities. Patient has seen a neurologist for this including EMG studies with no specific findings. Patient has had a normal B12 level. Patient is also had a normal thyroid level. Patient states that this continues to be somewhat intermittent and seems to be going up his thigh somewhat. The more concerning part in this he is starting to have more pain of his right foot. Patient has seen [facility] orthopedics for this problem previously and was given custom orthotics. Patient states that this has made minimal improvement. Patient describes the pain as a purple sensation worse with walking or standing long amount of time usually between the first and second toe on the ball of his foot. Patient has not tried anything except for Neurontin which she is taking 900 mg at night. Patient is unable to tolerate the medication during the day. Patient is also unable to tolerate anti-inflammatories on a regular basis. Patient the severity is 7/10.   Past medical, surgical, family and social history reviewed. Medications reviewed all in the electronic medical record.   Review of Systems: No headache, visual changes, nausea, vomiting, diarrhea, constipation, dizziness, abdominal pain, skin rash, fevers, chills, night sweats, weight loss, swollen lymph nodes, body aches, joint swelling, muscle aches, chest pain, shortness of breath, mood changes.   Objective:    Blood pressure 144/92, pulse 83, weight 185 lb (83.915 kg), SpO2 97.00%.   General: No apparent distress alert and oriented x3 mood and affect normal,  dressed appropriately.  HEENT: Pupils equal, extraocular movements intact Respiratory: Patient's speak in full sentences and does not appear short of breath Cardiovascular: No lower extremity edema, non tender, no erythema Skin: Warm dry intact with no signs of infection or rash on extremities or on axial skeleton. Abdomen: Soft nontender Neuro: Cranial nerves II through XII are intact, neurovascularly intact in all extremities with 2+ DTRs and 2+ pulses. Lymph: No lymphadenopathy of posterior or anterior cervical chain or axillae bilaterally.  Gait normal with good balance and coordination.  MSK: Non tender with full range of motion and good stability and symmetric strength and tone of shoulders, elbows, wrist, hip, knee and ankles bilaterally.  Foot exam bilaterally shows the patient does have splaying between the first and second toes secondary to severe breakdown of the transverse arch. Patient is tender to palpation on the ball of his foot between the first and second metatarsal heads mostly being the first metatarsal head. He is neurovascularly intact distally.  Musculoskeletal ultrasound was performed and interpreted by me today. Ultrasound shows the patient does have some mild stress reaction with cortical thickening as well as neovascularization over the metatarsal head of the first MTP. Patient does have some hypoechoic changes surrounding the tendon between the first and second toes. Mild enlargement of the nerve in this area as well. Otherwise unremarkable.  Impression and Recommendations:     This case required medical decision making of moderate complexity.

## 2012-12-24 NOTE — Assessment & Plan Note (Signed)
Foot pain - may be both a neuropathy and a mechanical problem.  Plan See Dr. Terrilee Files ( a [facility] desciple) tomorrow  If not a mechanical problem a trial of duloxetine 30 - 60mg  daily may help (compatible with wellbutrin)

## 2013-01-09 ENCOUNTER — Other Ambulatory Visit: Payer: Self-pay | Admitting: Internal Medicine

## 2013-01-12 ENCOUNTER — Other Ambulatory Visit (INDEPENDENT_AMBULATORY_CARE_PROVIDER_SITE_OTHER): Payer: 59

## 2013-01-12 ENCOUNTER — Other Ambulatory Visit: Payer: 59 | Admitting: *Deleted

## 2013-01-12 ENCOUNTER — Encounter: Payer: Self-pay | Admitting: Family Medicine

## 2013-01-12 ENCOUNTER — Ambulatory Visit (INDEPENDENT_AMBULATORY_CARE_PROVIDER_SITE_OTHER): Payer: 59 | Admitting: Family Medicine

## 2013-01-12 VITALS — BP 148/90 | HR 80 | Wt 189.0 lb

## 2013-01-12 DIAGNOSIS — G609 Hereditary and idiopathic neuropathy, unspecified: Secondary | ICD-10-CM

## 2013-01-12 DIAGNOSIS — R5381 Other malaise: Secondary | ICD-10-CM

## 2013-01-12 DIAGNOSIS — M79671 Pain in right foot: Secondary | ICD-10-CM

## 2013-01-12 DIAGNOSIS — R5383 Other fatigue: Secondary | ICD-10-CM

## 2013-01-12 DIAGNOSIS — M79609 Pain in unspecified limb: Secondary | ICD-10-CM

## 2013-01-12 LAB — T4, FREE: Free T4: 0.75 ng/dL (ref 0.60–1.60)

## 2013-01-12 MED ORDER — CYANOCOBALAMIN 1000 MCG/ML IJ SOLN
1000.0000 ug | Freq: Once | INTRAMUSCULAR | Status: AC
  Administered 2013-01-12: 1000 ug via INTRAMUSCULAR

## 2013-01-12 MED ORDER — CYANOCOBALAMIN 1000 MCG/ML IJ SOLN: 1000.0000 ug | Freq: Once | INTRAMUSCULAR | Status: DC

## 2013-01-12 MED ORDER — SYRINGE (DISPOSABLE) 1 ML MISC: 1.0000 "application " | Freq: Every day | Status: DC

## 2013-01-12 MED ORDER — INSULIN PEN NEEDLE 29G X 12.7MM MISC: 1.0000 "application " | Freq: Every day | Status: DC

## 2013-01-12 NOTE — Progress Notes (Signed)
  Subjective:    CC: Followup for right foot pain and neuropathy  HPI: Patient is very pleasant 62 year old gentleman following up for his right foot pain. Patient was found to have significant breakdown of the transverse arch there was likely causing some nerve compression as well as did have some increased swelling. Patient did have prednisone and stated that this did completely resolve all his pain. Patient states now it seems to do very well until the end of the day when he has some mild discomfort. As long as he doesn't ice bath at that time he seems to be doing very well. Patient is still complaining though of the neuropathy burning type sensation of the feet bilaterally. Once again patient has had a history of a carcinoid tumor as well as low testosterone that probably did contribute. Patient has been unable to do testosterone supplementation secondary to the history of the carcinoid tumor. Patient does have a lot of weakness and fatigue as well as muscle soreness overall.  Past medical history, Surgical history, Family history not pertinant except as noted below, Social history, Allergies, and medications have been entered into the medical record, reviewed, and no changes needed.   Review of Systems: No fevers, chills, night sweats, weight loss, chest pain, or shortness of breath.   Objective:   Blood pressure 148/90, pulse 80, weight 189 lb (85.73 kg), SpO2 97.00%.  General: Well Developed, well nourished, and in no acute distress.  Neuro: Alert and oriented x3, extra-ocular muscles intact, sensation grossly intact.  HEENT: Normocephalic, atraumatic, pupils equal round reactive to light, neck supple, no masses, no lymphadenopathy, thyroid nonpalpable.  Skin: Warm and dry, no rashes. Cardiac:  no lower extremity edema. Respiratory: Not using accessory muscles, speaking in full sentences. Abdominal: NT, soft Gait: Nonantlagic, good balance and coordination Lymphatic: no lymphadenopathy  in neck or axillae on palpation, non tender.  Musculoskeletal: Inspection and palpation of the right and left upper extremities including the shoulders elbows and wrist are unremarkable with full range of motion and good muscle strength and tone. Inspection and palpation of the right and left lower extremities including the hips knees and ankles are unremarkable and nontender with full range of motion and good muscle strength and tone and are symmetric. Foot exam is nontender on exam. Patient does appear to be neurovascularly intact with 5 out of 5 strength of the ankles bilaterally.  Impression and Recommendations:

## 2013-01-12 NOTE — Assessment & Plan Note (Addendum)
We did discuss different treatment options at this time. Patient would like [place] to start supplementation with B12. The do feel he has had normal levels but I could will be fine with having IM injections. Patient was prescribed this today. In addition encourage patient to get a B6 supplementation Patient also will have his thyroid as well as testosterone checked again. Patient can continue Neurontin Patient will followup in 3-4 weeks.

## 2013-01-12 NOTE — Patient Instructions (Addendum)
Try the B12  We will check testosterone and TSH Free T4 Look into natural testosterone diet and decrease estrogen containing medication VitD 2000 IU Fish oil 3 grams daily.  Tart Cherry extract.  Lets come back again 4 weeks again.  [employer]- Scientist, clinical (histocompatibility and immunogenetics) of sports medicine [employer] of [employer]etician.

## 2013-01-12 NOTE — Assessment & Plan Note (Signed)
Patient seems to be improving relatively well. Having patient's pain was coming from swelling as well as secondary to his neuropathy and not knowing how hard he was actually affecting his feet. Patient encouraged to continue did shoes as well as icing protocol. Patient can continue to follow up on an as-needed basis.

## 2013-01-15 ENCOUNTER — Encounter: Payer: Self-pay | Admitting: Family Medicine

## 2013-01-15 LAB — TESTOSTERONE, FREE, TOTAL, SHBG
Testosterone, Free: 41.5 pg/mL — ABNORMAL LOW (ref 47.0–244.0)
Testosterone-% Free: 1.7 % (ref 1.6–2.9)

## 2013-02-08 ENCOUNTER — Other Ambulatory Visit: Payer: Self-pay

## 2013-02-11 ENCOUNTER — Other Ambulatory Visit: Payer: Self-pay | Admitting: Internal Medicine

## 2013-05-02 ENCOUNTER — Other Ambulatory Visit: Payer: Self-pay | Admitting: Internal Medicine

## 2013-08-28 ENCOUNTER — Encounter: Payer: Self-pay | Admitting: Family Medicine

## 2013-08-28 ENCOUNTER — Ambulatory Visit (INDEPENDENT_AMBULATORY_CARE_PROVIDER_SITE_OTHER): Payer: 59 | Admitting: Family Medicine

## 2013-08-28 VITALS — BP 138/84 | HR 85 | Ht 68.0 in | Wt 190.0 lb

## 2013-08-28 DIAGNOSIS — G609 Hereditary and idiopathic neuropathy, unspecified: Secondary | ICD-10-CM

## 2013-08-28 MED ORDER — CLONAZEPAM 0.5 MG PO TABS: ORAL_TABLET | ORAL | Status: DC

## 2013-08-28 MED ORDER — GABAPENTIN 300 MG PO CAPS: ORAL_CAPSULE | ORAL | Status: DC

## 2013-08-28 MED ORDER — DOXYCYCLINE HYCLATE 100 MG PO TABS
100.0000 mg | ORAL_TABLET | Freq: Two times a day (BID) | ORAL | Status: DC
Start: 2013-08-28 — End: 2013-08-28

## 2013-08-28 MED ORDER — DOXYCYCLINE HYCLATE 100 MG PO TABS: 100.0000 mg | ORAL_TABLET | Freq: Two times a day (BID) | ORAL | Status: AC

## 2013-08-28 MED ORDER — CYANOCOBALAMIN 1000 MCG/ML IJ SOLN: 1000.0000 ug | INTRAMUSCULAR | Status: DC

## 2013-08-28 MED ORDER — HYDROCODONE-ACETAMINOPHEN 5-325 MG PO TABS: 1.0000 | ORAL_TABLET | Freq: Two times a day (BID) | ORAL | Status: DC | PRN

## 2013-08-28 NOTE — Patient Instructions (Addendum)
It is good to see you.  Consider Spenco orthotics sport insoles online or omega sports Try [name] at [place].  Doxycycline twice daily for 3 weeks Saw palmetto.  Avoid soy products, avoid lavender of any sort.  Come back in 3-4 weeks.

## 2013-08-28 NOTE — Progress Notes (Signed)
  Subjective:    CC: Peripheral neuropathy  Patient is following up for his peripheral neuropathy. 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 Patient states that overall he feels maybe 5-10% better. Patient states that the injections and B12 are going well. Denies any significant new symptoms. Patient states that the pain in the right foot has improved. Patient still though would like some mild increase in improvement from where he is.  Past medical history, Surgical history, Family history not pertinant except as noted below, Social history, Allergies, and medications have been entered into the medical record, reviewed, and no changes needed.   Review of Systems: No fevers, chills, night sweats, weight loss, chest pain, or shortness of breath.   Objective:   Blood pressure 138/84, pulse 85, height 5\' 8"  (1.727 m), weight 190 lb (86.183 kg), SpO2 97.00%.  General: Well Developed, well nourished, and in no acute distress.  Neuro: Alert and oriented x3, extra-ocular muscles intact, sensation grossly intact.  HEENT: Normocephalic, atraumatic, pupils equal round reactive to light, neck supple, no masses, no lymphadenopathy, thyroid nonpalpable.  Skin: Warm and dry, no rashes. Cardiac:  no lower extremity edema. Respiratory: Not using accessory muscles, speaking in full sentences. Abdominal: NT, soft Gait: Nonantlagic, good balance and coordination Lymphatic: no lymphadenopathy in neck or axillae on palpation, non tender.  Musculoskeletal: Inspection and palpation of the right and left upper extremities including the shoulders elbows and wrist are unremarkable with full range of motion and good muscle strength and tone. Inspection and palpation of the right and left lower extremities including the hips knees and ankles are unremarkable and nontender with full range of motion  and good muscle strength and tone and are symmetric. Foot exam is nontender on exam. Patient does appear to be neurovascularly intact with 5 out of 5 strength of the ankles bilaterally.  Impression and Recommendations:

## 2013-08-28 NOTE — Assessment & Plan Note (Addendum)
Patient continues to be symptomatic of this peripheral neuropathy. Patient may have made some mild improvement with B12. Continue B12. Patient has been doing research as well as concern for possible Lyme's disease. Consider working about like to do just the treatment of 3 weeks of doxycycline. In addition this we discussed different over-the-counter medications that could be beneficial. We will to get that is low testosterone likely could be contributing. Patient will try natural supplements and avoid other medications her food that could be possibly causing Edwena Felty is testosterone. Patient will try these interventions and come back in 3 weeks.  I did refill patient's medications until he gets a new primary care provider in 2 months time  Spent greater than 25 minutes with patient face-to-face and had greater than 50% of counseling including as described above in assessment and plan.

## 2013-09-06 ENCOUNTER — Ambulatory Visit: Payer: 59 | Attending: Family Medicine | Admitting: Physical Therapy

## 2013-09-06 DIAGNOSIS — IMO0001 Reserved for inherently not codable concepts without codable children: Secondary | ICD-10-CM | POA: Insufficient documentation

## 2013-09-06 DIAGNOSIS — R5381 Other malaise: Secondary | ICD-10-CM | POA: Insufficient documentation

## 2013-09-06 DIAGNOSIS — M2560 Stiffness of unspecified joint, not elsewhere classified: Secondary | ICD-10-CM | POA: Insufficient documentation

## 2013-09-12 ENCOUNTER — Ambulatory Visit: Payer: 59 | Admitting: Physical Therapy

## 2013-09-18 ENCOUNTER — Ambulatory Visit: Payer: 59 | Admitting: Physical Therapy

## 2013-09-19 ENCOUNTER — Ambulatory Visit (INDEPENDENT_AMBULATORY_CARE_PROVIDER_SITE_OTHER): Payer: 59 | Admitting: Family Medicine

## 2013-09-19 ENCOUNTER — Encounter: Payer: Self-pay | Admitting: Family Medicine

## 2013-09-19 VITALS — BP 132/82 | HR 67 | Ht 68.0 in | Wt 187.0 lb

## 2013-09-19 DIAGNOSIS — G609 Hereditary and idiopathic neuropathy, unspecified: Secondary | ICD-10-CM

## 2013-09-19 DIAGNOSIS — M533 Sacrococcygeal disorders, not elsewhere classified: Secondary | ICD-10-CM | POA: Insufficient documentation

## 2013-09-19 DIAGNOSIS — M999 Biomechanical lesion, unspecified: Secondary | ICD-10-CM | POA: Insufficient documentation

## 2013-09-19 NOTE — Patient Instructions (Signed)
Good to see you Turmeric 500mg  twice daily.  See you in 2 weeks for the orthotics New homework Sacroiliac Joint Mobilization and Rehab 1. Work on pretzel stretching, shoulder back and leg draped in front. 3-5 sets, 30 sec.. 2. hip abductor rotations. standing, hip flexion and rotation outward then inward. 3 sets, 15 reps. when can do comfortably, add ankle weights starting at 2 pounds.  3. cross over stretching - shoulder back to ground, same side leg crossover. 3-5 sets for 30 min..  4. rolling up and back knees to chest and rocking. 5. sacral tilt - 5 sets, hold for 5-10 seconds  See you soon.

## 2013-09-19 NOTE — Progress Notes (Signed)
Subjective:    CC: Peripheral neuropathy follow up Si joint pain  Patient is following up for his peripheral neuropathy. 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 They also started patient on some natural supplementation. Patient states he is made another 10-15% improvement. Patient states that on he does not he can notice any significant numbness in his feet which is happy that. Still has some no unfortunately at the end of long days with walking. Patient has been going to physical therapy which has been beneficial he states. Patient does have one more visit still remaining. Patient like to stop the gabapentin when available to  Patient is also having some lower back pain. Patient states it seems to be localized on the left side at the [facility]. Patient has had this pain for years. Patient states though that now seems to be more localized and seems to be more chronic. Denies any radiation of the legs any numbness. Patient has not tried any home exercises but was told by physical therapy that he may need further treatment. Patient states it does respond somewhat to anti-inflammatories but elected to try to avoid. Patient denies any weakness of the lower extremities but still having the peripheral neuropathy as described above.  Past medical history, Surgical history, Family history not pertinant except as noted below, Social history, Allergies, and medications have been entered into the medical record, reviewed, and no changes needed.   Review of Systems: No fevers, chills, night sweats, weight loss, chest pain, or shortness of breath.   Objective:   Blood pressure 132/82, pulse 67, height 5\' 8"  (1.727 m), weight 187 lb (84.823 kg), SpO2 96.00%.  General: Well Developed, well nourished, and in no acute distress.  Neuro: Alert and oriented x3, extra-ocular muscles intact,  sensation grossly intact.  HEENT: Normocephalic, atraumatic, pupils equal round reactive to light, neck supple, no masses, no lymphadenopathy, thyroid nonpalpable.  Skin: Warm and dry, no rashes. Cardiac:  no lower extremity edema. Respiratory: Not using accessory muscles, speaking in full sentences. Abdominal: NT, soft Gait: Nonantlagic, good balance and coordination Lymphatic: no lymphadenopathy in neck or axillae on palpation, non tender.  Musculoskeletal: Inspection and palpation of the right and left upper extremities including the shoulders elbows and wrist are unremarkable with full range of motion and good muscle strength and tone. Inspection and palpation of the right and left lower extremities including the hips knees and ankles are unremarkable and nontender with full range of motion and good muscle strength and tone and are symmetric. Foot exam is nontender on exam. Patient does appear to be neurovascularly intact with 5 out of 5 strength of the ankles bilaterally. Foot exam shows the patient is neurovascularly intact today. Patient does have mild pronation of the hindfoot.  Back Exam:  Inspection: Unremarkable  Motion: Flexion 45 deg, Extension 45 deg, Side Bending to 45 deg bilaterally,  Rotation to 45 deg bilaterally  SLR laying: Negative  XSLR laying: Negative  Palpable tenderness: Tender to palpation of the left SI joint. FABER: Positive left. Sensory change: Gross sensation intact to all lumbar and sacral dermatomes.  Reflexes: 2+ at both patellar tendons, 2+ at achilles tendons, Babinski's downgoing.  Strength at foot  Plantar-flexion: 5/5 Dorsi-flexion: 5/5 Eversion: 5/5 Inversion: 5/5  Leg strength  Quad: 5/5 Hamstring: 5/5 Hip flexor: 5/5 Hip abductors: 4/5  Gait unremarkable.  OMT Physical Exam  Standing flexion left  Seated Flexion left   Cervical  C4 flexed rotated and side bent right  Thoracic T3 extended rotated and side bent right T5 extended  rotated and side bent left  Lumbar L2 flexed rotated and side bent right  Sacrum left on left  Illium  Neutral    Impression and Recommendations:

## 2013-09-19 NOTE — Assessment & Plan Note (Signed)
Decision today to treat with OMT was based on Physical Exam  After verbal consent patient was treated with HVLA, muscle energy techniques in cervical, thoracic, and lumbar areas  Patient tolerated the procedure well with improvement in symptoms  Patient given exercises, stretches and lifestyle modifications  See medications in patient instructions if given  Patient will follow up in 2-3 weeks

## 2013-09-19 NOTE — Assessment & Plan Note (Signed)
Patient does have some sacroiliac joint dysfunction. Patient has not tried any home modalities at this time but did respond very well to manipulation a. Gave patient a home exercise program and showed proper technique. Patient will come back again in 3-4 weeks for further evaluation and treatment.  [id number]; 15 additional minutes spent for Therapeutic exercises as stated in above notes.

## 2013-09-19 NOTE — Assessment & Plan Note (Signed)
Patient has made some mild improvement with over-the-counter medications and B12 supplementation. In addition this patient has gone to physical therapy which has been helpful. I do believe with the peripheral neuropathy mostly been localized to the feet that also custom orthotics may be beneficial. We discussed about other anti-inflammatory natural occurring foods and medications that could be beneficial. Patient will follow up again in 3-4 weeks for further evaluation and treatment.  Spent greater than 25 minutes with patient face-to-face and had greater than 50% of counseling including as described above in assessment and plan.

## 2013-09-24 ENCOUNTER — Encounter: Payer: Self-pay | Admitting: Internal Medicine

## 2013-09-25 ENCOUNTER — Ambulatory Visit: Payer: 59 | Admitting: Physical Therapy

## 2013-10-03 ENCOUNTER — Encounter: Payer: Self-pay | Admitting: Family Medicine

## 2013-10-03 ENCOUNTER — Ambulatory Visit (INDEPENDENT_AMBULATORY_CARE_PROVIDER_SITE_OTHER): Payer: 59 | Admitting: Family Medicine

## 2013-10-03 VITALS — BP 132/82 | HR 64 | Ht 68.0 in | Wt 185.0 lb

## 2013-10-03 DIAGNOSIS — M79609 Pain in unspecified limb: Secondary | ICD-10-CM

## 2013-10-03 DIAGNOSIS — M79671 Pain in right foot: Secondary | ICD-10-CM

## 2013-10-03 NOTE — Assessment & Plan Note (Signed)
Orthotics made today we discussed the progression wear over the course of time. We discussed the home exercises again as well as the icing protocol. Patient will try this and we'll see how patient does. Patient has had stress reactions previously of the first metatarsal so a first ray post was kind of any fracture today. Patient will try this and come back again in 2-3 weeks for further evaluation the

## 2013-10-03 NOTE — Patient Instructions (Addendum)
Good to see you Wear the orthotics 2 hours the first day and increase 1 hour a day thereafter. Continue the icing and other modalities we have done before.  It will likely take 2 weeks to get used to the orthotics but I think it will help with some of the back pain as well.  Hip abductors.  On wall with heel and butt touching, raise leg 6 inches hold 2 seconds, down slow for count of 4 seconds.  30 reps each side daily.  I am sure I will see you soon in the next 3 weeks or so for manipulation.

## 2013-10-03 NOTE — Progress Notes (Signed)
  Subjective:    CC: Peripheral neuropathy follow up   Patient is following up for his peripheral neuropathy. 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 They also started patient on some natural supplementation. Patient states he is made another 10-15% improvement. Patient states that on he does not he can notice any significant numbness in his feet which is happy that. Still has some no unfortunately at the end of long days with walking. Patient has been going to physical therapy which has been beneficial he states. Patient continues to have been troubled though. Patient has failed all other conservative therapy including over-the-counter orthotics for his feet. Patient does have known overpronation of the hindfeet.  Past medical history, Surgical history, Family history not pertinant except as noted below, Social history, Allergies, and medications have been entered into the medical record, reviewed, and no changes needed.   Review of Systems: No fevers, chills, night sweats, weight loss, chest pain, or shortness of breath.   Objective:   Blood pressure 132/82, pulse 64, height 5\' 8"  (1.727 m), weight 185 lb (83.915 kg), SpO2 97.00%.  General: Well Developed, well nourished, and in no acute distress.  Neuro: Alert and oriented x3, extra-ocular muscles intact, sensation grossly intact.  HEENT: Normocephalic, atraumatic, pupils equal round reactive to light, neck supple, no masses, no lymphadenopathy, thyroid nonpalpable.  Skin: Warm and dry, no rashes. Cardiac:  no lower extremity edema. Respiratory: Not using accessory muscles, speaking in full sentences. Abdominal: NT, soft Gait: Nonantlagic, good balance and coordination Lymphatic: no lymphadenopathy in neck or axillae on palpation, non tender.  Musculoskeletal: Inspection and palpation of the right and left upper  extremities including the shoulders elbows and wrist are unremarkable with full range of motion and good muscle strength and tone. Inspection and palpation of the right and left lower extremities including the hips knees and ankles are unremarkable and nontender with full range of motion and good muscle strength and tone and are symmetric. Foot exam is nontender on exam. Patient does appear to be neurovascularly intact with 5 out of 5 strength of the ankles bilaterally. Foot exam shows the patient is neurovascularly intact today. Patient does have mild pronation of the hindfoot.   Patient was fitted for a : standard, cushioned, semi-rigid orthotic. The orthotic was heated and afterward the patient stood on the orthotic blank positioned on the orthotic stand. The patient was positioned in subtalar neutral position and 10 degrees of ankle dorsiflexion in a weight bearing stance. After completion of molding, a stable base was applied to the orthotic blank. The blank was ground to a stable position for weight bearing. Size:10 Base: First [facility] and Padding: None The patient ambulated these, and they were very comfortable.  I spent 45 minutes with this patient, greater than 50% was face-to-face time counseling regarding the below diagnosis.   Impression and Recommendations:

## 2013-10-10 ENCOUNTER — Ambulatory Visit: Payer: 59 | Attending: Family Medicine | Admitting: Physical Therapy

## 2013-10-10 DIAGNOSIS — M2560 Stiffness of unspecified joint, not elsewhere classified: Secondary | ICD-10-CM | POA: Insufficient documentation

## 2013-10-10 DIAGNOSIS — R5381 Other malaise: Secondary | ICD-10-CM | POA: Insufficient documentation

## 2013-10-10 DIAGNOSIS — IMO0001 Reserved for inherently not codable concepts without codable children: Secondary | ICD-10-CM | POA: Insufficient documentation

## 2013-11-21 ENCOUNTER — Other Ambulatory Visit: Payer: Self-pay | Admitting: *Deleted

## 2013-11-21 MED ORDER — BUPROPION HCL ER (XL) 300 MG PO TB24: ORAL_TABLET | ORAL | Status: DC

## 2013-11-23 ENCOUNTER — Other Ambulatory Visit: Payer: Self-pay | Admitting: *Deleted

## 2013-11-23 MED ORDER — GABAPENTIN 300 MG PO CAPS: ORAL_CAPSULE | ORAL | Status: DC

## 2013-12-01 ENCOUNTER — Encounter: Payer: Self-pay | Admitting: Internal Medicine

## 2013-12-10 ENCOUNTER — Other Ambulatory Visit: Payer: 59 | Admitting: Lab

## 2013-12-11 ENCOUNTER — Ambulatory Visit: Payer: 59 | Admitting: Internal Medicine

## 2013-12-11 ENCOUNTER — Ambulatory Visit (HOSPITAL_COMMUNITY)
Admission: RE | Admit: 2013-12-11 | Discharge: 2013-12-11 | Disposition: A | Discharge status: Home / Self Care | Payer: 59 | Source: Ambulatory Visit | Attending: Internal Medicine | Admitting: Internal Medicine

## 2013-12-11 ENCOUNTER — Other Ambulatory Visit (HOSPITAL_BASED_OUTPATIENT_CLINIC_OR_DEPARTMENT_OTHER): Payer: 59

## 2013-12-11 DIAGNOSIS — Z9889 Other specified postprocedural states: Secondary | ICD-10-CM | POA: Insufficient documentation

## 2013-12-11 DIAGNOSIS — C3490 Malignant neoplasm of unspecified part of unspecified bronchus or lung: Secondary | ICD-10-CM | POA: Insufficient documentation

## 2013-12-11 DIAGNOSIS — Z85118 Personal history of other malignant neoplasm of bronchus and lung: Secondary | ICD-10-CM

## 2013-12-11 LAB — COMPREHENSIVE METABOLIC PANEL (CC13)
ALT: 24 U/L (ref 0–55)
AST: 24 U/L (ref 5–34)
Albumin: 3.9 g/dL (ref 3.5–5.0)
Alkaline Phosphatase: 74 U/L (ref 40–150)
Anion Gap: 8 mEq/L (ref 3–11)
BILIRUBIN TOTAL: 0.91 mg/dL (ref 0.20–1.20)
BUN: 15.7 mg/dL (ref 7.0–26.0)
CO2: 23 mEq/L (ref 22–29)
CREATININE: 0.9 mg/dL (ref 0.7–1.3)
Calcium: 9.2 mg/dL (ref 8.4–10.4)
Chloride: 109 mEq/L (ref 98–109)
Glucose: 112 mg/dl (ref 70–140)
Potassium: 4.1 mEq/L (ref 3.5–5.1)
Sodium: 140 mEq/L (ref 136–145)
Total Protein: 6.6 g/dL (ref 6.4–8.3)

## 2013-12-11 LAB — CBC WITH DIFFERENTIAL/PLATELET
BASO%: 0.4 % (ref 0.0–2.0)
Basophils Absolute: 0 10*3/uL (ref 0.0–0.1)
EOS%: 2.1 % (ref 0.0–7.0)
Eosinophils Absolute: 0.1 10*3/uL (ref 0.0–0.5)
HEMATOCRIT: 44.6 % (ref 38.4–49.9)
HGB: 15 g/dL (ref 13.0–17.1)
LYMPH#: 1.7 10*3/uL (ref 0.9–3.3)
LYMPH%: 26.3 % (ref 14.0–49.0)
MCH: 31.5 pg (ref 27.2–33.4)
MCHC: 33.7 g/dL (ref 32.0–36.0)
MCV: 93.4 fL (ref 79.3–98.0)
MONO#: 0.9 10*3/uL (ref 0.1–0.9)
MONO%: 13.3 % (ref 0.0–14.0)
NEUT#: 3.8 10*3/uL (ref 1.5–6.5)
NEUT%: 57.9 % (ref 39.0–75.0)
PLATELETS: 195 10*3/uL (ref 140–400)
RBC: 4.77 10*6/uL (ref 4.20–5.82)
RDW: 11.8 % (ref 11.0–14.6)
WBC: 6.6 10*3/uL (ref 4.0–10.3)

## 2013-12-12 ENCOUNTER — Telehealth: Payer: Self-pay | Admitting: Internal Medicine

## 2013-12-12 ENCOUNTER — Ambulatory Visit (HOSPITAL_BASED_OUTPATIENT_CLINIC_OR_DEPARTMENT_OTHER): Payer: 59 | Admitting: Internal Medicine

## 2013-12-12 ENCOUNTER — Encounter: Payer: Self-pay | Admitting: Internal Medicine

## 2013-12-12 VITALS — BP 140/76 | HR 71 | Temp 98.0°F | Resp 18 | Ht 68.0 in | Wt 187.4 lb

## 2013-12-12 DIAGNOSIS — C3431 Malignant neoplasm of lower lobe, right bronchus or lung: Secondary | ICD-10-CM

## 2013-12-12 DIAGNOSIS — Z85118 Personal history of other malignant neoplasm of bronchus and lung: Secondary | ICD-10-CM

## 2013-12-12 NOTE — Telephone Encounter (Signed)
gv adn printed appt sched and avs for pt for Sept 2016

## 2013-12-12 NOTE — Progress Notes (Signed)
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  INTERVAL HISTORY: [name] Page 63 y.o. male returns to the clinic today for annual followup visit. The patient is feeling fine today with no specific complaints except for her chest congestion recently. He denied having any significant chest pain, shortness of breath, cough or hemoptysis. He denied having any weight loss or night sweats. He has repeat CT scan of the chest performed recently and he is here for evaluation and discussion of his scan results.  MEDICAL HISTORY: Past Medical History  Diagnosis Date  . Disturbance of skin sensation   . Other diseases of lung, not elsewhere classified   . Other abnormal blood chemistry   . Other testicular hypofunction   . Depressive disorder, not elsewhere classified   . Esophageal reflux   . Allergic rhinitis, cause unspecified   . Personal history of urinary calculi   . Personal history of other diseases of digestive system     ALLERGIES:  is allergic to bee venom; flagyl; and nsaids.  MEDICATIONS:  Current Outpatient Prescriptions  Medication Sig Dispense Refill  . Ascorbic Acid (VITAMIN C PO) Take by mouth.      Marland Kitchen aspirin 81 MG tablet Take 81 mg by mouth daily.      Marland Kitchen b complex vitamins tablet Take 1 tablet by mouth daily.        Marland Kitchen buPROPion (WELLBUTRIN XL) 300 MG 24 hr tablet TAKE 1 TABLET BY MOUTH EVERY DAY  90 tablet  0  . Cholecalciferol (VITAMIN D) 2000 UNITS CAPS Take 1 capsule by mouth daily.       . clonazePAM (KLONOPIN) 0.5 MG tablet TAKE 1 TABLET TWICE DAILY AS NEEDED  60 tablet  5  . cyanocobalamin (,VITAMIN B-12,) 1000 MCG/ML injection Inject 1 mL (1,000 mcg total) into  the muscle every 30 (thirty) days. weekly  30 mL  1  . EPIPEN 2-PAK 0.3 MG/0.3ML SOAJ injection USE AS DIRECTED  2 Device  0  . famotidine (PEPCID) 20 MG tablet Take 20 mg by mouth 2 (two) times daily.      . fexofenadine (ALLEGRA) 180 MG tablet Take 180 mg by mouth daily as needed.       . gabapentin (NEURONTIN) 300 MG capsule TAKE 3-4 CAPSULES AT BEDTIME  180 capsule  0  . HYDROcodone-acetaminophen (NORCO/VICODIN) 5-325 MG per tablet Take 1 tablet by mouth 2 (two) times daily as needed.  30 tablet  0  . Insulin Pen Needle 29G X 13.0QM MISC 1 application by Does not apply route daily.  100 each  3  . MULTIPLE VITAMIN PO Take 1 tablet by mouth daily.       . Omega-3 Fatty Acids (FISH OIL) 1000 MG CAPS Take 1 capsule by mouth daily.      . Syringe, Disposable, 1 ML MISC 1 application by Does not apply route daily.  200 each  0   No current facility-administered medications for this visit.    SURGICAL HISTORY:  Past Surgical History  Procedure Laterality Date  . Parotid tumor surgery    . Tonsillectomy and adenoidectomy    . Minithoractomy with partial lobectomy for pulmonary carcinoid  6-11  [name]    REVIEW OF SYSTEMS:  A comprehensive review of systems was negative.   PHYSICAL EXAMINATION: General appearance: alert, cooperative and no distress Head: Normocephalic, without obvious abnormality, atraumatic Neck: no adenopathy Lymph nodes: Cervical, supraclavicular, and axillary nodes normal. Resp: clear to auscultation bilaterally Cardio: regular rate and rhythm, S1, S2 normal, no murmur, click, rub or gallop GI: soft, non-tender; bowel sounds normal; no masses,  no organomegaly Extremities: extremities normal, atraumatic, no cyanosis or edema  ECOG PERFORMANCE STATUS: 0 - Asymptomatic  Blood pressure 140/76, pulse 71, temperature 98 F (36.7 C), temperature source Oral, resp. rate 18, height 5\' 8"  (1.727 m), weight 187 lb 6.4 oz (85.004 kg).  LABORATORY DATA: Lab Results    Component Value Date   WBC 6.6 12/11/2013   HGB 15.0 12/11/2013   HCT 44.6 12/11/2013   MCV 93.4 12/11/2013   PLT 195 12/11/2013      Chemistry      Component Value Date/Time   NA 140 12/11/2013 1009   NA 136 07/14/2012 1123   K 4.1 12/11/2013 1009   K 4.5 07/14/2012 1123   CL 101 07/14/2012 1123   CO2 23 12/11/2013 1009   CO2 29 07/14/2012 1123   BUN 15.7 12/11/2013 1009   BUN 14 07/14/2012 1123   CREATININE 0.9 12/11/2013 1009   CREATININE 1.0 07/14/2012 1123      Component Value Date/Time   CALCIUM 9.2 12/11/2013 1009   CALCIUM 9.3 07/14/2012 1123   ALKPHOS 74 12/11/2013 1009   ALKPHOS 70 07/14/2012 1123   AST 24 12/11/2013 1009   AST 37 07/14/2012 1123   ALT 24 12/11/2013 1009   ALT 56* 07/14/2012 1123   BILITOT 0.91 12/11/2013 1009   BILITOT 1.0 07/14/2012 1123       RADIOGRAPHIC STUDIES: Ct Chest Wo Contrast  12/11/2013   CLINICAL DATA:  Lung cancer.  Prior right lung surgery.  EXAM: CT CHEST WITHOUT CONTRAST  TECHNIQUE: Multidetector CT imaging of the chest was performed following the standard protocol without IV contrast.  COMPARISON:  11/28/2012  FINDINGS: Postoperative changes noted at the right lung base. Stable slight elevation of the right hemidiaphragm. No pleural effusions. No recurrent pulmonary nodules. Minimal scarring in the left lower lobe. Left lung otherwise clear.  Heart is normal size. Aorta is normal caliber. No mediastinal, hilar, or axillary adenopathy. Chest wall soft tissues are unremarkable. Imaging into the upper abdomen shows no acute findings.  No acute bony abnormality or focal bone lesion.  IMPRESSION: Stable postoperative changes in the right lower lobe. No evidence of recurrent or metastatic disease in the chest. No acute findings.   Electronically Signed   By: Rolm Baptise M.D.   On: 12/11/2013 11:02   ASSESSMENT AND PLAN: This is a very pleasant 63 years old white male with history of stage IA pulmonary carcinoid tumor status post right lower lobe segmentectomy with lymph  node dissection has been observation since July 2007 with no evidence for disease recurrence. I discussed the scan results with the patient. I recommended for him to continue on observation with repeat CT scan of the chest without contrast in one year.  He was advised to call immediately if he has any concerning symptoms in the interval.  The patient voices understanding of current disease status and treatment options and is in agreement with the current care plan.  All questions were answered. The patient knows to call the clinic with any problems, questions or concerns. We can certainly see  the patient much sooner if necessary.  Disclaimer: This note was dictated with voice recognition software. Similar sounding words can inadvertently be transcribed and may be missed upon review.

## 2013-12-24 ENCOUNTER — Telehealth: Payer: Self-pay | Admitting: Family Medicine

## 2013-12-24 NOTE — Telephone Encounter (Signed)
error 

## 2013-12-26 ENCOUNTER — Ambulatory Visit (INDEPENDENT_AMBULATORY_CARE_PROVIDER_SITE_OTHER): Payer: 59 | Admitting: Family Medicine

## 2013-12-26 ENCOUNTER — Encounter: Payer: Self-pay | Admitting: Family Medicine

## 2013-12-26 VITALS — BP 132/84 | HR 75 | Ht 68.0 in | Wt 185.0 lb

## 2013-12-26 DIAGNOSIS — M79609 Pain in unspecified limb: Secondary | ICD-10-CM

## 2013-12-26 DIAGNOSIS — M79671 Pain in right foot: Secondary | ICD-10-CM

## 2013-12-26 DIAGNOSIS — E291 Testicular hypofunction: Secondary | ICD-10-CM

## 2013-12-26 MED ORDER — "HUBER NEEDLE 22G X 1-1/2"" MISC": 1.0000 "application " | Status: DC | PRN

## 2013-12-26 NOTE — Assessment & Plan Note (Signed)
Patient's the pain is likely multifactorial with the idiopathic peripheral neuropathy. Encourage patient to wear the orthotics on a more regular basis. We discussed proper shoe wear as well that'll be helpful. Discuss continuing the icing and the over-the-counter supplementation I think will be helpful. Patient has been trying to decrease his gabapentin and I would have an increased again to help. We are going to try also supplementation of testosterone to see if this will help with some of the neuropathy type pain as well. Patient and will follow up again with me in 6 weeks for further evaluation and treatment.

## 2013-12-26 NOTE — Assessment & Plan Note (Signed)
Patient was warned of potential side effects of the testosterone. Patient elected to try this secondary to not having complete resolution of the peripheral neuropathy and this could be a potential cause. Patient will start the injections every 2 weeks with the idea of decreasing to monthly once we get to a stable amount. We are starting at a low dose of 100 mg per injection. Patient told proper technique. Patient will followup in 6 weeks to make sure that there is no side effects and patient will have a test of his testosterone level in 12 weeks and PSA level.  Spent greater than 25 minutes with patient face-to-face and had greater than 50% of counseling including as described above in assessment and plan.

## 2013-12-26 NOTE — Patient Instructions (Signed)
Consider gabapentin again at a higher dose until dose 3 of testerone then come down slowly again.  Ice bath and continue the orthotics.  Leucine would be helpful.  B vitamins and the Vitamin D  Start testosterone every 2 weeks then I will see you again in 6 weeks  Use injection in the upper lateral aspect of the thigh.  We will check PSA in 3 months.

## 2013-12-26 NOTE — Progress Notes (Signed)
  Subjective:    CC: Peripheral neuropathy follow up   Patient is following up for his peripheral neuropathy. 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  Patient was seen by oncologist.  States that his oncologist would allow for testosterone supplementation. States the orthotics have been improving.  Ice sometimes, still though with hiking greater then 3 miles had some pain in 2 weeks.    Past medical history, Surgical history, Family history not pertinant except as noted below, Social history, Allergies, and medications have been entered into the medical record, reviewed, and no changes needed.   Review of Systems: No fevers, chills, night sweats, weight loss, chest pain, or shortness of breath.   Objective:   Blood pressure 132/84, pulse 75, height 5\' 8"  (1.727 m), weight 185 lb (83.915 kg), SpO2 96.00%.  General: Well Developed, well nourished, and in no acute distress.  Neuro: Alert and oriented x3, extra-ocular muscles intact, sensation grossly intact.  HEENT: Normocephalic, atraumatic, pupils equal round reactive to light, neck supple, no masses, no lymphadenopathy, thyroid nonpalpable.  Skin: Warm and dry, no rashes. Cardiac:  no lower extremity edema. Respiratory: Not using accessory muscles, speaking in full sentences. Abdominal: NT, soft Prostate exam shows the patient has no enlargement. No nodules present on rectal exam Gait: Nonantlagic, good balance and coordination Lymphatic: no lymphadenopathy in neck or axillae on palpation, non tender.  Musculoskeletal: Inspection and palpation of the right and left upper extremities including the shoulders elbows and wrist are unremarkable with full range of motion and good muscle strength and tone. Inspection and palpation of the right and left lower extremities including the hips knees and ankles are unremarkable  and nontender with full range of motion and good muscle strength and tone and are symmetric. Foot exam is nontender on exam. Patient does appear to be neurovascularly intact with 5 out of 5 strength of the ankles bilaterally. Foot exam shows the patient is neurovascularly intact today. Patient does have mild pronation of the hindfoot.       Impression and Recommendations:

## 2014-01-04 ENCOUNTER — Telehealth: Payer: Self-pay | Admitting: Internal Medicine

## 2014-01-04 MED ORDER — TESTOSTERONE CYPIONATE 200 MG/ML IM SOLN: 100.0000 mg | INTRAMUSCULAR | Status: DC

## 2014-01-04 NOTE — Telephone Encounter (Signed)
Called [facility] spoke with Aaron Edelman to verify if they received script. Aaron Edelman stated they only received the needles. Gave md order verbally, but we change the milliter to 1 ml which willlast him 28 days. The 10 ml would last for 10 months. Updated epic. Pt has been notified rx call into pharmacy...Johny Chess

## 2014-01-04 NOTE — Telephone Encounter (Signed)
Pt called in and said that [facility] did not get testosterone cypionate (DEPOTESTOTERONE CYPIONATE) 200 MG/ML injection [[id number]]  He wanted to know if we could send it back over

## 2014-01-08 ENCOUNTER — Telehealth: Payer: Self-pay | Admitting: Internal Medicine

## 2014-01-08 MED ORDER — BUPROPION HCL ER (XL) 300 MG PO TB24: ORAL_TABLET | ORAL | Status: DC

## 2014-01-08 MED ORDER — GABAPENTIN 300 MG PO CAPS: ORAL_CAPSULE | ORAL | Status: DC

## 2014-01-08 NOTE — Telephone Encounter (Signed)
Lm on vm for pt.to cb

## 2014-01-08 NOTE — Telephone Encounter (Signed)
May fill wellbutrin and neurontin.   He is on 2 controlled medicines-klonopin and hydrocodone-thse would need an office visit for refills of these alone (not an establish visit-just a visit for refills).

## 2014-01-08 NOTE — Telephone Encounter (Signed)
Pt has appt dec 8 however pt needs refills of buPROPion (WELLBUTRIN XL) 300 MG 24 hr tablet  1 x/ day   gabapentin (NEURONTIN) 300 MG capsule  3-4 caps at bedtime 120 w/ one refill HYDROcodone-acetaminophen (NORCO/VICODIN) 5-325 MG per tablet  Prn #30  (Pt states he takes as needed)  [place] [facility] [place] to leave message

## 2014-01-08 NOTE — Telephone Encounter (Signed)
Are these ok to refill? 

## 2014-01-08 NOTE — Telephone Encounter (Signed)
wellbutrin and Neurontin filled.   Mrs. [name] Page can you please get pt scheduled for a 15 min visit for medication refille for Hydrocodon per Dr. Yong Channel.

## 2014-02-06 ENCOUNTER — Ambulatory Visit (INDEPENDENT_AMBULATORY_CARE_PROVIDER_SITE_OTHER): Payer: 59 | Admitting: Family Medicine

## 2014-02-06 ENCOUNTER — Encounter: Payer: Self-pay | Admitting: Family Medicine

## 2014-02-06 VITALS — BP 132/86 | HR 82 | Ht 68.0 in | Wt 191.0 lb

## 2014-02-06 DIAGNOSIS — E291 Testicular hypofunction: Secondary | ICD-10-CM

## 2014-02-06 DIAGNOSIS — G609 Hereditary and idiopathic neuropathy, unspecified: Secondary | ICD-10-CM

## 2014-02-06 MED ORDER — TESTOSTERONE CYPIONATE 200 MG/ML IM SOLN: 100.0000 mg | INTRAMUSCULAR | Status: DC

## 2014-02-06 NOTE — Assessment & Plan Note (Signed)
Patient continues to have some difficulty with the peripheral neuropathy. Patient has had orthotics, we are doing over-the-counter supplementation and we are now replacing patient's B12 deficiency as well as his testosterone deficiency. I'm encouraged to see the response already in a short course of time. Patient will continue to do a we are doing and hopefully we will can titrate him off the gabapentin in the long run. We discussed continuing the icing regimen as well. We discussed balancing exercises. We discussed the possibility of formal physical therapy which patient declined. At this time I would like to make no other significant changes to his regimen at this time and patient will come back in 6 weeks. We will check a testosterone as well as a PSA level.  Spent greater than 25 minutes with patient face-to-face and had greater than 50% of counseling including as described above in assessment and plan.

## 2014-02-06 NOTE — Progress Notes (Signed)
  Subjective:    CC: Peripheral neuropathy follow up low testosterone.    Patient is following up for his peripheral neuropathy. Patient was started on B12 injections and was found to have a low testosterone  And after last visit 6 weeks ago was started on testosterone replacement.Patient states that he has noticed some improvement. Patient states that he has noticed some mild increase in energy as well as decrease in the peripheral neuropathy. Patient has been able to decrease his gabapentin 600 mg at night. States that he still has some balance and coordination trouble during the day but he contributes this to the gabapentin itself.  Past medical history of tumor in the lungs.  Patient was seen by oncologist.   Patient's main concern has been continued peripheral neuropathy.   Past medical history, Surgical history, Family history not pertinant except as noted below, Social history, Allergies, and medications have been entered into the medical record, reviewed, and no changes needed.   Review of Systems: No fevers, chills, night sweats, weight loss, chest pain, or shortness of breath.   Objective:   Blood pressure 132/86, pulse 82, height 5\' 8"  (1.727 m), weight 191 lb (86.637 kg), SpO2 94 %.  General: Well Developed, well nourished, and in no acute distress.  Neuro: Alert and oriented x3, extra-ocular muscles intact, sensation grossly intact.  HEENT: Normocephalic, atraumatic, pupils equal round reactive to light, neck supple, no masses, no lymphadenopathy, thyroid nonpalpable.  Skin: Warm and dry, no rashes. Cardiac:  no lower extremity edema. Respiratory: Not using accessory muscles, speaking in full sentences. Abdominal: NT, soft Prostate exam shows the patient has no enlargement. No nodules present on rectal exam Gait: Nonantlagic, good balance and coordination Lymphatic: no lymphadenopathy in neck or axillae on palpation, non tender.  Musculoskeletal: Inspection and palpation of  the right and left upper extremities including the shoulders elbows and wrist are unremarkable with full range of motion and good muscle strength and tone. Inspection and palpation of the right and left lower extremities including the hips knees and ankles are unremarkable and nontender with full range of motion and good muscle strength and tone and are symmetric. Foot exam is nontender on exam. Patient does appear to be neurovascularly intact with 5 out of 5 strength of the ankles bilaterally. Foot exam shows the patient is neurovascularly intact today.     Impression and Recommendations:

## 2014-02-06 NOTE — Patient Instructions (Signed)
Good to see you You look good.  Continue the testosterone still every 2 weeks and then when I se eyou in 6 weeks will consider monthly.  Continuing the vitamins Decrease gabapentin. 300mg  at night.  Consider cymbalta at next visit.  50 squats daily Exercises on wall.  Heel and butt touching.  Raise leg 6 inches and hold 2 seconds.  Down slow for count of 4 seconds.  1 set of 30 reps daily on both sides.  Love idea of treadmill 3 times a week.  See me again in 6 weeks and have labs drawn first.

## 2014-02-12 ENCOUNTER — Other Ambulatory Visit: Payer: Self-pay | Admitting: Family Medicine

## 2014-02-12 ENCOUNTER — Encounter: Payer: Self-pay | Admitting: Family Medicine

## 2014-02-12 MED ORDER — TESTOSTERONE CYPIONATE 200 MG/ML IM SOLN: 100.0000 mg | INTRAMUSCULAR | Status: DC

## 2014-02-14 ENCOUNTER — Other Ambulatory Visit: Payer: Self-pay | Admitting: Family Medicine

## 2014-02-14 NOTE — Telephone Encounter (Signed)
Refill done.  

## 2014-02-26 ENCOUNTER — Other Ambulatory Visit (INDEPENDENT_AMBULATORY_CARE_PROVIDER_SITE_OTHER): Payer: 59

## 2014-02-26 DIAGNOSIS — E291 Testicular hypofunction: Secondary | ICD-10-CM

## 2014-02-26 LAB — TESTOSTERONE: Testosterone: 485.01 ng/dL (ref 300.00–890.00)

## 2014-02-26 LAB — PSA: PSA: 0.21 ng/mL (ref 0.10–4.00)

## 2014-03-06 ENCOUNTER — Encounter: Payer: Self-pay | Admitting: Family Medicine

## 2014-03-06 ENCOUNTER — Ambulatory Visit (INDEPENDENT_AMBULATORY_CARE_PROVIDER_SITE_OTHER): Payer: 59 | Admitting: Family Medicine

## 2014-03-06 VITALS — BP 140/86 | HR 73 | Ht 68.0 in | Wt 192.0 lb

## 2014-03-06 DIAGNOSIS — E291 Testicular hypofunction: Secondary | ICD-10-CM

## 2014-03-06 DIAGNOSIS — G609 Hereditary and idiopathic neuropathy, unspecified: Secondary | ICD-10-CM

## 2014-03-06 DIAGNOSIS — R7989 Other specified abnormal findings of blood chemistry: Secondary | ICD-10-CM

## 2014-03-06 MED ORDER — SYRINGE (DISPOSABLE) 1 ML MISC: 1.0000 "application " | Freq: Every day | Status: DC

## 2014-03-06 MED ORDER — "NEEDLE (DISP) 25G X 5/8"" MISC": Status: DC

## 2014-03-06 MED ORDER — "HUBER NEEDLE 22G X 1-1/2"" MISC": 1.0000 "application " | Status: DC | PRN

## 2014-03-06 MED ORDER — DULOXETINE HCL 20 MG PO CPEP: 20.0000 mg | ORAL_CAPSULE | Freq: Every day | ORAL | Status: DC

## 2014-03-06 NOTE — Assessment & Plan Note (Signed)
Discussed with patient at great length. At this time we will try different approach and started him on Cymbalta low dose. Patient was warned the potential side effects and interaction with some of his other medications but I think this will be low likelihood. Patient will start taking his daily. Patient will contact me in 2-3 weeks and give me an update on how he is doing. We may consider titrating up to 40 mg daily if necessary. I would like to probably not go above 40 mg daily. Patient otherwise will come back in 6 weeks for further evaluation.

## 2014-03-06 NOTE — Progress Notes (Signed)
  Subjective:    CC: Peripheral neuropathy follow up low testosterone.    Patient is following up for his peripheral neuropathy. Patient was started on B12 injections and was found to have a low testosterone  And after last visit 6 weeks ago was started on testosterone replacement. Patient did have increasing his testosterone.  Lab Results  Component Value Date   TESTOSTERONE 485.01 02/26/2014   This almost doubled. She is feeling like he has more energy overall. Patient denies any side effects. Overall patient is feeling better.  Patient continues to have some of the peripheral neuropathy but may be it is improving. Patient is continuing to take gabapentin at night. Patient states that he is on his feet a significant amount time he has more of a discomfort in his feet. Patient denies that this stops him from doing any activities.   Past medical history of tumor in the lungs.  Patient was seen by oncologist.   Patient's main concern has been continued peripheral neuropathy.   Past medical history, Surgical history, Family history not pertinant except as noted below, Social history, Allergies, and medications have been entered into the medical record, reviewed, and no changes needed.   Review of Systems: No fevers, chills, night sweats, weight loss, chest pain, or shortness of breath.   Objective:   Blood pressure 140/86, pulse 73, height 5\' 8"  (1.727 m), weight 192 lb (87.091 kg), SpO2 97 %.  General: Well Developed, well nourished, and in no acute distress.  Neuro: Alert and oriented x3, extra-ocular muscles intact, sensation grossly intact.  HEENT: Normocephalic, atraumatic, pupils equal round reactive to light, neck supple, no masses, no lymphadenopathy, thyroid nonpalpable.  Skin: Warm and dry, no rashes. Cardiac:  no lower extremity edema. Respiratory: Not using accessory muscles, speaking in full sentences. Abdominal: NT, soft Prostate exam shows the patient has no  enlargement. No nodules present on rectal exam Gait: Nonantlagic, good balance and coordination Lymphatic: no lymphadenopathy in neck or axillae on palpation, non tender.  Musculoskeletal: Inspection and palpation of the right and left upper extremities including the shoulders elbows and wrist are unremarkable with full range of motion and good muscle strength and tone. Inspection and palpation of the right and left lower extremities including the hips knees and ankles are unremarkable and nontender with full range of motion and good muscle strength and tone and are symmetric. Foot exam is nontender on exam. Patient does appear to be neurovascularly intact with 5 out of 5 strength of the ankles bilaterally. Foot exam shows the patient is neurovascularly intact today. No change from previous exam    Impression and Recommendations:

## 2014-03-06 NOTE — Patient Instructions (Signed)
Good to see you We will try the cymbalta 20 mg daily Call me in 2 weeks or send message and we will discuss.  Continue the injections of testosterone and the B12, I think they are helping.  Gabapentin still 300 at night, we will consider 100mg  during the day if no significant improvement.  Lets definitely see you again in February and come in for blood draw 1 day early and we will check Testosterone.  Happy Holidays!

## 2014-03-06 NOTE — Assessment & Plan Note (Signed)
Patient has made improvement with the intramuscular injections. Patient is to continue the intramuscular injections every 2 weeks for another 3-4 rounds. Patient then we'll go to monthly thereafter. We will recheck patient's value in 12 weeks. At that time if it is above 500 we will continue. If it below 500 out consider going up to 1 mL monthly.

## 2014-03-11 ENCOUNTER — Encounter: Payer: Self-pay | Admitting: Family Medicine

## 2014-03-11 MED ORDER — TESTOSTERONE CYPIONATE 200 MG/ML IM SOLN: INTRAMUSCULAR | Status: DC

## 2014-03-12 ENCOUNTER — Ambulatory Visit (INDEPENDENT_AMBULATORY_CARE_PROVIDER_SITE_OTHER): Payer: 59 | Admitting: Family Medicine

## 2014-03-12 ENCOUNTER — Encounter: Payer: Self-pay | Admitting: Family Medicine

## 2014-03-12 VITALS — BP 150/88 | HR 77 | Temp 97.6°F | Wt 192.0 lb

## 2014-03-12 DIAGNOSIS — G609 Hereditary and idiopathic neuropathy, unspecified: Secondary | ICD-10-CM

## 2014-03-12 DIAGNOSIS — F329 Major depressive disorder, single episode, unspecified: Secondary | ICD-10-CM

## 2014-03-12 DIAGNOSIS — I10 Essential (primary) hypertension: Secondary | ICD-10-CM

## 2014-03-12 DIAGNOSIS — F411 Generalized anxiety disorder: Secondary | ICD-10-CM

## 2014-03-12 DIAGNOSIS — F32A Depression, unspecified: Secondary | ICD-10-CM

## 2014-03-12 MED ORDER — CLONAZEPAM 0.5 MG PO TABS: ORAL_TABLET | ORAL | Status: DC

## 2014-03-12 MED ORDER — HYDROCODONE-ACETAMINOPHEN 5-325 MG PO TABS: 0.5000 | ORAL_TABLET | Freq: Two times a day (BID) | ORAL | Status: DC | PRN

## 2014-03-12 NOTE — Assessment & Plan Note (Addendum)
Reasonable control at present but patient explains has some fluctuation in symptoms. Cymbalta for peripheral neuropathy would also likely benefit depression and i advised him to start. Testosterone deficiency could be contributory-being replaced by Dr. Charlann Boxer of Sports Medicine.

## 2014-03-12 NOTE — Progress Notes (Signed)
[name] Reddish, MD Phone: [phone]  Subjective:  Patient presents today to establish care with me as their new primary care provider. Patient was formerly a patient of Dr. Linda Hedges. Chief complaint-noted.   Depression-reasonable control Generalized Anxiety-reasonable control but requiring benzodiazepines Idiopathic peripheral neuropathy- improving control  Depression with reasonable control per patient.Brief PHQ2 of 1 on wellbutrin but states at times his symptoms are worse. He also has a history of panic attacks which he has not had in several years. He takes Klonopin 0.5mg  prn and takes 6-8 per week typically.   For his peripheral neuropathy he was prescribed cymbalta by Dr. Charlann Boxer which he has not yet started. He is taking gabapentin 300mg  at night. Testosterone replacement seems to have helped his depressive symptoms/low energy as well as slightly reduced neuropathy.   ROS- Denies SI/HI  Hypertension-mild poor control  BP Readings from Last 3 Encounters:  03/12/14 150/88  03/06/14 140/86  02/06/14 132/86  Home BP monitoring-no Compliant with medications-no medications currently ROS-Denies any CP, HA, SOB, blurry vision, LE edema.   The following were reviewed and entered/updated in epic: Past Medical History  Diagnosis Date  . Disturbance of skin sensation   . Other diseases of lung, not elsewhere classified   . Other abnormal blood chemistry   . Other testicular hypofunction   . Depressive disorder, not elsewhere classified   . Esophageal reflux   . Allergic rhinitis, cause unspecified   . Personal history of urinary calculi   . Personal history of other diseases of digestive system    Patient Active Problem List   Diagnosis Date Noted  . Generalized anxiety disorder 03/12/2014    Priority: Medium  . Hereditary and idiopathic peripheral neuropathy 12/22/2012    Priority: Medium  . Lung cancer 12/11/2012    Priority: Medium  . Dyspnea 07/14/2012    Priority:  Medium  . Hyperlipidemia 07/14/2012    Priority: Medium  . Allergic reaction, history of 10/25/2011    Priority: Medium  . Depression 02/16/2010    Priority: Medium  . Low testosterone 11/21/2007    Priority: Medium  . Nonspecific abnormal electrocardiogram (ECG) (EKG) 07/14/2012    Priority: Low  . Rectal or anal pain 10/01/2010    Priority: Low  . Allergic rhinitis 06/13/2007    Priority: Low  . GERD 06/13/2007    Priority: Low  . IBS (irritable bowel syndrome) 06/13/2007    Priority: Low  . Essential hypertension 03/12/2014  . SI (sacroiliac) joint dysfunction 09/19/2013  . Nonallopathic lesion of sacral region 09/19/2013  . Nonallopathic lesion of thoracic region 09/19/2013  . Nonallopathic lesion of lumbosacral region 09/19/2013  . Right foot pain 12/22/2012   Past Surgical History  Procedure Laterality Date  . Parotid tumor surgery    . Tonsillectomy and adenoidectomy    . 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 Early death Neg Hx   . Heart disease Neg Hx     mother in 40s had lesoin  . Hypertension Mother     father  . Dementia Father   . Depression Father     and anxiety  . Anxiety disorder Mother     Medications- reviewed and updated Current Outpatient Prescriptions  Medication Sig Dispense Refill  . Ascorbic Acid (VITAMIN C PO) Take by mouth.    Marland Kitchen aspirin 81 MG tablet Take 81 mg by mouth daily.    Marland Kitchen  b complex vitamins tablet Take 1 tablet by mouth daily.      Marland Kitchen buPROPion (WELLBUTRIN XL) 300 MG 24 hr tablet TAKE 1 TABLET BY MOUTH EVERY DAY 90 tablet 0  . Cholecalciferol (VITAMIN D) 2000 UNITS CAPS Take 1 capsule by mouth daily.     . cyanocobalamin (,VITAMIN B-12,) 1000 MCG/ML injection Inject 1 mL (1,000 mcg total) into the muscle every 30 (thirty) days. weekly 30 mL 1  . DULoxetine (CYMBALTA) 20 MG capsule Take 1 capsule (20 mg total) by mouth daily. 30  capsule 1  . famotidine (PEPCID) 20 MG tablet Take 20 mg by mouth 2 (two) times daily.    . fexofenadine (ALLEGRA) 180 MG tablet Take 180 mg by mouth daily as needed.     . gabapentin (NEURONTIN) 300 MG capsule TAKE 3-4 CAPSULES AT BEDTIME 180 capsule 0  . Insulin Pen Needle 29G X 40.9WJ MISC 1 application by Does not apply route daily. 100 each 3  . MULTIPLE VITAMIN PO Take 1 tablet by mouth daily.     . Omega-3 Fatty Acids (FISH OIL) 1000 MG CAPS Take 1 capsule by mouth daily.    . Syringe, Disposable, 1 ML MISC 1 application by Does not apply route daily. 200 each 0  . testosterone cypionate (DEPO-TESTOSTERONE) 200 MG/ML injection INJECT 0.5 MLS INTRAMUSCULARLY EVERY 14 DAYS 6 mL 1  . clonazePAM (KLONOPIN) 0.5 MG tablet TAKE 1 TABLET TWICE DAILY AS NEEDED 50 tablet 5  . EPIPEN 2-PAK 0.3 MG/0.3ML SOAJ injection USE AS DIRECTED (Patient not taking: Reported on 03/12/2014) 2 Device 0  . HYDROcodone-acetaminophen (NORCO/VICODIN) 5-325 MG per tablet Take 0.5-1 tablets by mouth 2 (two) times daily as needed (No more than once a week use.). 30 tablet 0  . Needle, Disp, (HUBER NEEDLE 22GX1-1/2") 22G X 1-9/1" MISC 1 application by Does not apply route as needed. (Patient not taking: Reported on 03/12/2014) 25 each 1  . NEEDLE, DISP, 25 G 25G X 5/8" MISC 1 application by Does not apply route daily. (Patient not taking: Reported on 03/12/2014) 200 each 0   No current facility-administered medications for this visit.    Allergies-reviewed and updated Allergies  Allergen Reactions  . Bee Venom Anaphylaxis  . Flagyl [Metronidazole Hcl]   . Nsaids     REACTION: intolereance    History   Social History  . Marital Status: Married    Spouse Name: N/A    Number of Children: 0  . Years of Education: 82   Occupational History  . 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 Smoking status: Never Smoker   . Smokeless tobacco: Never Used  . Alcohol Use: Yes      Comment: rare occ  . Drug Use: No  . Sexual Activity:    Partners: Female   Other Topics Concern  . 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 Married 30 + years. No children, lots of critters.       Cancer Survivor- carcinoid lung cancer.   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 Martin Majestic back to work for ability to be insured.       Hobbies: music, home repair/improvement, cats    ROS--See HPI   Objective: BP 150/88 mmHg  Pulse 77  Temp(Src) 97.6 F (36.4 C)  Wt 192 lb (87.091 kg) Gen: NAD, resting comfortably Neck: no  thyromegaly CV: RRR no murmurs rubs or gallops Lungs: CTAB no crackles, wheeze, rhonchi Abdomen: soft/nontender/nondistended/normal bowel sounds. Ext: no edema Skin: warm, dry, no rash   Neuro: grossly normal, moves all extremities, PERRLA   Assessment/Plan:  Depression Reasonable control at present but patient explains has some fluctuation in symptoms. Cymbalta for peripheral neuropathy would also likely benefit depression and i advised him to start. Testosterone deficiency could be contributory-being replaced by Dr. Charlann Boxer of Sports Medicine.   Generalized anxiety disorder Advised patient to start cymbalta and continue wellbutrin. I am hopeful his need for klonopin will decrease though I refilled at this time and advised 8x a week maximum-avoid increasing use.   Hereditary and idiopathic peripheral neuropathy Followed by Dr. Tamala Julian. I encouraged patient to start the cymbalta. I did give him a refill of Norco which he uses approximately once a week and advised that hopefully with cymbalta we could cut this out or reduce further-asked him to trial 1/2 tab of 5mg  hydrocodone.   Essential hypertension Mild poor control today. Per JNC 8 goal above 60 would be SBP<150 but ideally I would like to keep patient below 140. Regardless with 2 susbequent  readings of >140 patient with new diagnosis of hypertension. I advised him to start exercising and of the DASH diet. He will follow up in 3 months and we will get fasting bloodwork at that time and also reevaluate cholesterol.   3 month follow up  Meds ordered this encounter  Medications  . HYDROcodone-acetaminophen (NORCO/VICODIN) 5-325 MG per tablet    Sig: Take 0.5-1 tablets by mouth 2 (two) times daily as needed (No more than once a week use.).    Dispense:  30 tablet    Refill:  0  . clonazePAM (KLONOPIN) 0.5 MG tablet    Sig: TAKE 1 TABLET TWICE DAILY AS NEEDED    Dispense:  50 tablet    Refill:  5

## 2014-03-12 NOTE — Assessment & Plan Note (Addendum)
Mild poor control today. Per JNC 8 goal above 60 would be SBP<150 but ideally I would like to keep patient below 140. Regardless with 2 susbequent readings of >140 patient with new diagnosis of hypertension. I advised him to start exercising and of the DASH diet. He will follow up in 3 months and we will get fasting bloodwork at that time and also reevaluate cholesterol.

## 2014-03-12 NOTE — Assessment & Plan Note (Signed)
Advised patient to start cymbalta and continue wellbutrin. I am hopeful his need for klonopin will decrease though I refilled at this time and advised 8x a week maximum-avoid increasing use.

## 2014-03-12 NOTE — Assessment & Plan Note (Signed)
Followed by Dr. Tamala Julian. I encouraged patient to start the cymbalta. I did give him a refill of Norco which he uses approximately once a week and advised that hopefully with cymbalta we could cut this out or reduce further-asked him to trial 1/2 tab of 5mg  hydrocodone.

## 2014-03-12 NOTE — Patient Instructions (Addendum)
Refilled hydrocodone and klonopin.   I would encourage you to start the cymbalta-I think it may help with depression, anxiety, as well as neuropathy. Hopefully you can cut down or out the klonopin eventually.   Come back in 3 months and we will do fasting labs if you have a morning appointment. Keeping an eye on your blood pressure. Would advise restarting exercise and DASH or mediterranean eating plan.   DASH Eating Plan DASH stands for "Dietary Approaches to Stop Hypertension." The DASH eating plan is a healthy eating plan that has been shown to reduce high blood pressure (hypertension). Additional health benefits may include reducing the risk of type 2 diabetes mellitus, heart disease, and stroke. The DASH eating plan may also help with weight loss. WHAT DO I NEED TO KNOW ABOUT THE DASH EATING PLAN? For the DASH eating plan, you will follow these general guidelines:  Choose foods with a percent daily value for sodium of less than 5% (as listed on the food label).  Use salt-free seasonings or herbs instead of table salt or sea salt.  Check with your health care provider or pharmacist before using salt substitutes.  Eat lower-sodium products, often labeled as "lower sodium" or "no salt added."  Eat fresh foods.  Eat more vegetables, fruits, and low-fat dairy products.  Choose whole grains. Look for the word "whole" as the first word in the ingredient list.  Choose fish and skinless chicken or [place] more often than red meat. Limit fish, poultry, and meat to 6 oz (170 g) each day.  Limit sweets, desserts, sugars, and sugary drinks.  Choose heart-healthy fats.  Limit cheese to 1 oz (28 g) per day.  Eat more home-cooked food and less restaurant, buffet, and fast food.  Limit fried foods.  Cook foods using methods other than frying.  Limit canned vegetables. If you do use them, rinse them well to decrease the sodium.  When eating at a restaurant, ask that your food be prepared  with less salt, or no salt if possible. WHAT FOODS CAN I EAT? Seek help from a dietitian for individual calorie needs. Grains Whole grain or whole wheat bread. Brown rice. Whole grain or whole wheat pasta. Quinoa, bulgur, and whole grain cereals. Low-sodium cereals. Corn or whole wheat flour tortillas. Whole grain cornbread. Whole grain crackers. Low-sodium crackers. Vegetables Fresh or frozen vegetables (raw, steamed, roasted, or grilled). Low-sodium or reduced-sodium tomato and vegetable juices. Low-sodium or reduced-sodium tomato sauce and paste. Low-sodium or reduced-sodium canned vegetables.  Fruits All fresh, canned (in natural juice), or frozen fruits. Meat and Other Protein Products Ground beef (85% or leaner), grass-fed beef, or beef trimmed of fat. Skinless chicken or [place]. Ground chicken or [place]. Pork trimmed of fat. All fish and seafood. Eggs. Dried beans, peas, or lentils. Unsalted nuts and seeds. Unsalted canned beans. Dairy Low-fat dairy products, such as skim or 1% milk, 2% or reduced-fat cheeses, low-fat ricotta or cottage cheese, or plain low-fat yogurt. Low-sodium or reduced-sodium cheeses. Fats and Oils Tub margarines without trans fats. Light or reduced-fat mayonnaise and salad dressings (reduced sodium). Avocado. Safflower, olive, or canola oils. Natural peanut or almond butter. Other Unsalted popcorn and pretzels. The items listed above may not be a complete list of recommended foods or beverages. Contact your dietitian for more options. WHAT FOODS ARE NOT RECOMMENDED? Grains White bread. White pasta. White rice. Refined cornbread. Bagels and croissants. Crackers that contain trans fat. Vegetables Creamed or fried vegetables. Vegetables in a cheese sauce. Regular  canned vegetables. Regular canned tomato sauce and paste. Regular tomato and vegetable juices. Fruits Dried fruits. Canned fruit in light or heavy syrup. Fruit juice. Meat and Other Protein Products Fatty  cuts of meat. Ribs, chicken wings, bacon, sausage, bologna, salami, chitterlings, fatback, hot dogs, bratwurst, and packaged luncheon meats. Salted nuts and seeds. Canned beans with salt. Dairy Whole or 2% milk, cream, half-and-half, and cream cheese. Whole-fat or sweetened yogurt. Full-fat cheeses or blue cheese. Nondairy creamers and whipped toppings. Processed cheese, cheese spreads, or cheese curds. Condiments Onion and garlic salt, seasoned salt, table salt, and sea salt. Canned and packaged gravies. Worcestershire sauce. Tartar sauce. Barbecue sauce. Teriyaki sauce. Soy sauce, including reduced sodium. Steak sauce. Fish sauce. Oyster sauce. Cocktail sauce. Horseradish. Ketchup and mustard. Meat flavorings and tenderizers. Bouillon cubes. Hot sauce. Tabasco sauce. Marinades. Taco seasonings. Relishes. Fats and Oils Butter, stick margarine, lard, shortening, ghee, and bacon fat. Coconut, palm kernel, or palm oils. Regular salad dressings. Other Pickles and olives. Salted popcorn and pretzels. The items listed above may not be a complete list of foods and beverages to avoid. Contact your dietitian for more information. WHERE CAN I FIND MORE INFORMATION? National Heart, Lung, and Blood Institute: travelstabloid.com Document Released: 03/11/2011 Document Revised: 08/06/2013 Document Reviewed: 01/24/2013 [facility] Patient Information 2015 [place], [place]. This information is not intended to replace advice given to you by your health care provider. Make sure you discuss any questions you have with your health care provider.

## 2014-03-13 ENCOUNTER — Encounter: Payer: Self-pay | Admitting: Family Medicine

## 2014-04-03 ENCOUNTER — Ambulatory Visit: Payer: 59 | Admitting: Family Medicine

## 2014-05-01 ENCOUNTER — Encounter: Payer: Self-pay | Admitting: Family Medicine

## 2014-05-01 ENCOUNTER — Other Ambulatory Visit (INDEPENDENT_AMBULATORY_CARE_PROVIDER_SITE_OTHER): Payer: 59

## 2014-05-01 DIAGNOSIS — E291 Testicular hypofunction: Secondary | ICD-10-CM

## 2014-05-01 LAB — TESTOSTERONE: TESTOSTERONE: 156.87 ng/dL — AB (ref 300.00–890.00)

## 2014-05-02 ENCOUNTER — Encounter: Payer: Self-pay | Admitting: Family Medicine

## 2014-05-08 ENCOUNTER — Ambulatory Visit (INDEPENDENT_AMBULATORY_CARE_PROVIDER_SITE_OTHER): Payer: 59 | Admitting: Family Medicine

## 2014-05-08 ENCOUNTER — Encounter: Payer: Self-pay | Admitting: Family Medicine

## 2014-05-08 VITALS — BP 140/72 | HR 84 | Ht 68.0 in | Wt 193.0 lb

## 2014-05-08 DIAGNOSIS — G609 Hereditary and idiopathic neuropathy, unspecified: Secondary | ICD-10-CM

## 2014-05-08 DIAGNOSIS — E291 Testicular hypofunction: Secondary | ICD-10-CM

## 2014-05-08 DIAGNOSIS — M7671 Peroneal tendinitis, right leg: Secondary | ICD-10-CM

## 2014-05-08 DIAGNOSIS — R7989 Other specified abnormal findings of blood chemistry: Secondary | ICD-10-CM

## 2014-05-08 MED ORDER — VITAMIN D (ERGOCALCIFEROL) 1.25 MG (50000 UNIT) PO CAPS: 50000.0000 [IU] | ORAL_CAPSULE | ORAL | Status: DC

## 2014-05-08 NOTE — Progress Notes (Signed)
Pre visit review using our clinic review tool, if applicable. No additional management support is needed unless otherwise documented below in the visit note. 

## 2014-05-08 NOTE — Assessment & Plan Note (Signed)
Continue current therapy. Patient does not like to take gabapentin if he can avoid it. Patient capable with the B12 supplementation.

## 2014-05-08 NOTE — Assessment & Plan Note (Signed)
Discussed with patient again at great length. Patient will continue with the testosterone supplementation. We also are going to add DHEA to see if this will help. We discussed dosing that will be safe. Patient will come back and see me again in 6 weeks for further evaluation and treatment.

## 2014-05-08 NOTE — Assessment & Plan Note (Signed)
She does have more of a tendinitis overall. We discussed icing regimen as well as the possibility of changing his gait. Patient will try to change his shoes. Patient continues to have some difficulty we need to consider ultrasound guided injection in the tendon sheath versus possible bracing.

## 2014-05-08 NOTE — Progress Notes (Signed)
  Subjective:    CC: Peripheral neuropathy follow up low testosterone.    Patient is following up for his peripheral neuropathy. Patient was started on B12 injections and was found to have a low testosterone.  Patient did decrease his testosterone injections to every 4 weeks. Patient unfortunately though did have a decrease in his testosterone level to 162. Patient previously this was for 165. Patient has noticed that he has been somewhat more fatigued recently. We have already change testosterone back to every 2 weeks. Prescription has been filled.. Patient states that the peripheral neuropathy has been a little bit better. Patient states that as long as he wears the orthotics he seems to be doing relatively well. Patient though has noticed a new problem with pain on the lateral aspect the ankle. Patient has been walking a lot more. Patient denies any numbness or tingling. States that a cane though swell on the outside of his foot sometimes.  Past medical history of tumor in the lungs.  Patient was seen by oncologist.   Patient's main concern has been continued peripheral neuropathy.   Past medical history, Surgical history, Family history not pertinant except as noted below, Social history, Allergies, and medications have been entered into the medical record, reviewed, and no changes needed.   Review of Systems: No fevers, chills, night sweats, weight loss, chest pain, or shortness of breath.   Objective:   Blood pressure 140/72, pulse 84, height 5\' 8"  (1.727 m), weight 193 lb (87.544 kg), SpO2 95 %.  General: Well Developed, well nourished, and in no acute distress.  Neuro: Alert and oriented x3, extra-ocular muscles intact, sensation grossly intact.  HEENT: Normocephalic, atraumatic, pupils equal round reactive to light, neck supple, no masses, no lymphadenopathy, thyroid nonpalpable.  Skin: Warm and dry, no rashes. Cardiac:  no lower extremity edema. Respiratory: Not using accessory  muscles, speaking in full sentences. Abdominal: NT, soft Prostate exam shows the patient has no enlargement. No nodules present on rectal exam Gait: Nonantlagic, good balance and coordination Lymphatic: no lymphadenopathy in neck or axillae on palpation, non tender.  Musculoskeletal: Inspection and palpation of the right and left upper extremities including the shoulders elbows and wrist are unremarkable with full range of motion and good muscle strength and tone. Inspection and palpation of the right and left lower extremities including the hips knees and ankles are unremarkable and nontender with full range of motion and good muscle strength and tone and are symmetric. Foot exam is nontender on exam. Patient does appear to be neurovascularly intact with 5 out of 5 strength of the ankles bilaterally. Foot exam shows the patient is neurovascularly intact today. He does have some mild pain on the lateral aspect of the foot.    Impression and Recommendations:

## 2014-05-08 NOTE — Patient Instructions (Signed)
Good to see you New exercises for your ankle.  Ice bath to the foot still can help Wear the orthotics on the treadmill.  DHEA 50mg  daily or 7-Keto 100mg  daily may help the testosterone.  Montior the rib but 50, 000 units of Vitamin D weekly will help See me again in 4 weeks.

## 2014-05-13 ENCOUNTER — Encounter: Payer: Self-pay | Admitting: Gastroenterology

## 2014-05-21 ENCOUNTER — Encounter: Payer: Self-pay | Admitting: Family Medicine

## 2014-05-21 ENCOUNTER — Other Ambulatory Visit: Payer: Self-pay | Admitting: Family Medicine

## 2014-05-21 ENCOUNTER — Encounter: Payer: Self-pay | Admitting: Internal Medicine

## 2014-05-21 DIAGNOSIS — R5382 Chronic fatigue, unspecified: Secondary | ICD-10-CM

## 2014-05-21 NOTE — Telephone Encounter (Signed)
Can order cbc, cmet, lipid panel, tsh under hyperlipidemia.  Make sure he is aware of how to fast for labs. Thanks

## 2014-05-22 ENCOUNTER — Telehealth: Payer: Self-pay

## 2014-05-22 ENCOUNTER — Encounter: Payer: Self-pay | Admitting: Family Medicine

## 2014-05-22 DIAGNOSIS — E785 Hyperlipidemia, unspecified: Secondary | ICD-10-CM

## 2014-05-22 NOTE — Telephone Encounter (Signed)
done

## 2014-05-23 ENCOUNTER — Telehealth: Payer: Self-pay

## 2014-05-23 DIAGNOSIS — E785 Hyperlipidemia, unspecified: Secondary | ICD-10-CM

## 2014-05-23 NOTE — Telephone Encounter (Signed)
done

## 2014-05-23 NOTE — Telephone Encounter (Signed)
Labs entered.

## 2014-05-23 NOTE — Telephone Encounter (Signed)
Did have sugar >100 a year ago. Could order under hyperglycemia. Thanks. Please update patient

## 2014-05-24 ENCOUNTER — Telehealth: Payer: Self-pay

## 2014-05-24 DIAGNOSIS — R739 Hyperglycemia, unspecified: Secondary | ICD-10-CM

## 2014-05-24 NOTE — Telephone Encounter (Signed)
Future lab order entered.

## 2014-05-27 ENCOUNTER — Other Ambulatory Visit (INDEPENDENT_AMBULATORY_CARE_PROVIDER_SITE_OTHER): Payer: 59

## 2014-05-27 DIAGNOSIS — R5382 Chronic fatigue, unspecified: Secondary | ICD-10-CM

## 2014-05-27 DIAGNOSIS — R739 Hyperglycemia, unspecified: Secondary | ICD-10-CM

## 2014-05-27 DIAGNOSIS — E785 Hyperlipidemia, unspecified: Secondary | ICD-10-CM

## 2014-05-27 LAB — COMPREHENSIVE METABOLIC PANEL
ALT: 24 U/L (ref 0–53)
AST: 22 U/L (ref 0–37)
Albumin: 4.2 g/dL (ref 3.5–5.2)
Alkaline Phosphatase: 66 U/L (ref 39–117)
BUN: 11 mg/dL (ref 6–23)
CO2: 23 meq/L (ref 19–32)
Calcium: 9.3 mg/dL (ref 8.4–10.5)
Chloride: 106 mEq/L (ref 96–112)
Creatinine, Ser: 0.96 mg/dL (ref 0.40–1.50)
GFR: 83.81 mL/min (ref >60.00)
Glucose, Bld: 109 mg/dL — ABNORMAL HIGH (ref 70–99)
Potassium: 4.5 mEq/L (ref 3.5–5.1)
Sodium: 140 mEq/L (ref 135–145)
Total Bilirubin: 1 mg/dL (ref 0.2–1.2)
Total Protein: 6.5 g/dL (ref 6.0–8.3)

## 2014-05-27 LAB — LIPID PANEL
Cholesterol: 157 mg/dL (ref 0–200)
HDL: 48.9 mg/dL (ref >39.00)
LDL CALC: 92 mg/dL (ref 0–99)
NonHDL: 108.1
Total CHOL/HDL Ratio: 3
Triglycerides: 81 mg/dL (ref 0.0–149.0)
VLDL: 16.2 mg/dL (ref 0.0–40.0)

## 2014-05-27 LAB — HEMOGLOBIN A1C: HEMOGLOBIN A1C: 5.5 % (ref 4.6–6.5)

## 2014-05-27 LAB — CBC
HCT: 47.4 % (ref 39.0–52.0)
HEMOGLOBIN: 16 g/dL (ref 13.0–17.0)
MCHC: 33.7 g/dL (ref 30.0–36.0)
MCV: 93.3 fl (ref 78.0–100.0)
PLATELETS: 217 10*3/uL (ref 150.0–400.0)
RBC: 5.08 Mil/uL (ref 4.22–5.81)
RDW: 12.6 % (ref 11.5–15.5)
WBC: 6.6 10*3/uL (ref 4.0–10.5)

## 2014-05-27 LAB — TSH: TSH: 2.44 u[IU]/mL (ref 0.35–4.50)

## 2014-05-28 LAB — CORTISOL, FREE

## 2014-05-31 ENCOUNTER — Encounter: Payer: Self-pay | Admitting: Family Medicine

## 2014-05-31 LAB — DHEA: DHEA: 281 ng/dL (ref 61–1636)

## 2014-06-05 ENCOUNTER — Ambulatory Visit (INDEPENDENT_AMBULATORY_CARE_PROVIDER_SITE_OTHER): Payer: 59 | Admitting: Family Medicine

## 2014-06-05 ENCOUNTER — Encounter: Payer: Self-pay | Admitting: Family Medicine

## 2014-06-05 VITALS — BP 130/78 | HR 75 | Ht 68.0 in | Wt 189.0 lb

## 2014-06-05 DIAGNOSIS — G609 Hereditary and idiopathic neuropathy, unspecified: Secondary | ICD-10-CM

## 2014-06-05 DIAGNOSIS — R7989 Other specified abnormal findings of blood chemistry: Secondary | ICD-10-CM

## 2014-06-05 DIAGNOSIS — E279 Disorder of adrenal gland, unspecified: Secondary | ICD-10-CM | POA: Insufficient documentation

## 2014-06-05 DIAGNOSIS — E291 Testicular hypofunction: Secondary | ICD-10-CM

## 2014-06-05 MED ORDER — GABAPENTIN 100 MG PO CAPS: 200.0000 mg | ORAL_CAPSULE | Freq: Every day | ORAL | Status: DC

## 2014-06-05 NOTE — Progress Notes (Signed)
  Subjective:    CC: Peripheral neuropathy follow up low testosterone.    Patient is following up for his peripheral neuropathy. Patient was started on B12 injections and was found to have a low testosterone.  Patient increase his testosterone injections again to every 2 weeks. In addition of this patient was started on DHEA secondary to lower end of normal at 281. Patient states that he has started the medication and states that he is feeling somewhat better. This is not helped his peripheral neuropathy but has up at the fatigue as well as some muscle strength. Patient continues T1's weakly vitamin D supplementation which has been helpful as well. Patient is continuing the B12 supplementations. Overall patient states that he thinks he is making some progression.  Past medical history of tumor in the lungs.  Patient was seen by oncologist.   Patient's main concern has been continued peripheral neuropathy.   Past medical history, Surgical history, Family history not pertinant except as noted below, Social history, Allergies, and medications have been entered into the medical record, reviewed, and no changes needed.   Review of Systems: No fevers, chills, night sweats, weight loss, chest pain, or shortness of breath.   Objective:   Blood pressure 130/78, pulse 75, height 5\' 8"  (1.727 m), weight 189 lb (85.73 kg), SpO2 95 %.  General: Well Developed, well nourished, and in no acute distress.  Neuro: Alert and oriented x3, extra-ocular muscles intact, sensation grossly intact.  HEENT: Normocephalic, atraumatic, pupils equal round reactive to light, neck supple, no masses, no lymphadenopathy, thyroid nonpalpable.  Skin: Warm and dry, no rashes. Cardiac:  no lower extremity edema. Respiratory: Not using accessory muscles, speaking in full sentences. Abdominal: NT, soft Prostate exam shows the patient has no enlargement. No nodules present on rectal exam Gait: Nonantlagic, good balance and  coordination Lymphatic: no lymphadenopathy in neck or axillae on palpation, non tender.  Musculoskeletal: Inspection and palpation of the right and left upper extremities including the shoulders elbows and wrist are unremarkable with full range of motion and good muscle strength and tone. Inspection and palpation of the right and left lower extremities including the hips knees and ankles are unremarkable and nontender with full range of motion and good muscle strength and tone and are symmetric. Foot exam is nontender on exam. Patient does appear to be neurovascularly intact with 5 out of 5 strength of the ankles bilaterally. Foot exam shows the patient is neurovascularly intact today. Continues to have collection of the transverse arch.    Impression and Recommendations:

## 2014-06-05 NOTE — Assessment & Plan Note (Signed)
Continue current treatment will continue to monitor. Repeat labs in 6 weeks

## 2014-06-05 NOTE — Patient Instructions (Signed)
Good to see you You are doing great.  We will get you orthotics Continue all the vitamins and increase DHEA in AM and not the PM dose.  Continue the B12 See me again in 6 weeks and we will check testosterone 2-3 days before I see you.

## 2014-06-05 NOTE — Progress Notes (Signed)
Pre visit review using our clinic review tool, if applicable. No additional management support is needed unless otherwise documented below in the visit note. 

## 2014-06-05 NOTE — Assessment & Plan Note (Signed)
Patient will be back for orthotics which will hopefully help with alignment as well as help with some muscle fatigue he feels throughout the day.

## 2014-06-05 NOTE — Assessment & Plan Note (Signed)
We will continue to decrease patient's gabapentin with him started to do relatively well. Patient is not taking the Cymbalta. We discussed continuing the B12 supplementation as well as the other natural vitamins. I do think that patient may also respond well to the new orthotics which patient will be size in the near future. Patient and will come back in 6 weeks for further evaluation.

## 2014-06-12 ENCOUNTER — Ambulatory Visit (INDEPENDENT_AMBULATORY_CARE_PROVIDER_SITE_OTHER): Payer: 59 | Admitting: Family Medicine

## 2014-06-12 ENCOUNTER — Encounter: Payer: Self-pay | Admitting: Family Medicine

## 2014-06-12 VITALS — BP 140/78 | HR 75 | Temp 98.0°F | Wt 187.0 lb

## 2014-06-12 DIAGNOSIS — G609 Hereditary and idiopathic neuropathy, unspecified: Secondary | ICD-10-CM

## 2014-06-12 DIAGNOSIS — F411 Generalized anxiety disorder: Secondary | ICD-10-CM

## 2014-06-12 DIAGNOSIS — I10 Essential (primary) hypertension: Secondary | ICD-10-CM

## 2014-06-12 MED ORDER — HYDROCODONE-ACETAMINOPHEN 5-325 MG PO TABS: 0.5000 | ORAL_TABLET | Freq: Two times a day (BID) | ORAL | Status: DC | PRN

## 2014-06-12 NOTE — Progress Notes (Signed)
[name], MD Phone: [phone]  Subjective:   [name] Page is a 64 y.o. year old very pleasant male patient who presents with the following:  Hypertension-mild poor control but improved from last visit  BP Readings from Last 3 Encounters:  06/12/14 140/78  06/05/14 130/78  05/08/14 140/72   Home BP monitoring-no Compliant with medications-no rx currently Has lost 5 lbs. Trying to watch what he eats-given DASH diet last visit. Not very physically active ROS-Denies any CP, HA, SOB, blurry vision, LE edema, transient weakness, orthopnea, PND.   Peripheral Neuropathy- reasonable control -still using norco 1-2x a week if pain severe but most of time controlled on gabapentin reasonably. Did not start cymbalta.  ROS- no leg weakness  GAD-on wellbutrin. Cymbalta may have helped with this but he does not want to take. Klonopin 8x a week maximum and 4 of these are to help with sleep before a shift working.  ROS- no SI/HI. Depressive symptoms controlled  Past Medical History- Patient Active Problem List   Diagnosis Date Noted  . Generalized anxiety disorder 03/12/2014    Priority: Medium  . Essential hypertension 03/12/2014    Priority: Medium  . Hereditary and idiopathic peripheral neuropathy 12/22/2012    Priority: Medium  . Lung cancer 12/11/2012    Priority: Medium  . Dyspnea 07/14/2012    Priority: Medium  . Hyperlipidemia 07/14/2012    Priority: Medium  . Depression 02/16/2010    Priority: Medium  . Low testosterone 11/21/2007    Priority: Medium  . Nonspecific abnormal electrocardiogram (ECG) (EKG) 07/14/2012    Priority: Low  . Allergic reaction, history of 10/25/2011    Priority: Low  . Rectal or anal pain 10/01/2010    Priority: Low  . Allergic rhinitis 06/13/2007    Priority: Low  . GERD 06/13/2007    Priority: Low  . IBS (irritable bowel syndrome) 06/13/2007    Priority: Low  . 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 Peroneal tendonitis of right lower  extremity 05/08/2014  . SI (sacroiliac) joint dysfunction 09/19/2013  . Nonallopathic lesion of sacral region 09/19/2013  . Nonallopathic lesion of thoracic region 09/19/2013  . Nonallopathic lesion of lumbosacral region 09/19/2013  . Right foot pain 12/22/2012   Medications- reviewed and updated Current Outpatient Prescriptions  Medication Sig Dispense Refill  . Ascorbic Acid (VITAMIN C PO) Take by mouth.    Marland Kitchen aspirin 81 MG tablet Take 81 mg by mouth daily.    Marland Kitchen b complex vitamins tablet Take 1 tablet by mouth daily.      Marland Kitchen buPROPion (WELLBUTRIN XL) 300 MG 24 hr tablet TAKE 1 TABLET BY MOUTH EVERY DAY 90 tablet 0  . Cholecalciferol (VITAMIN D) 2000 UNITS CAPS Take 1 capsule by mouth daily.     . cyanocobalamin (,VITAMIN B-12,) 1000 MCG/ML injection Inject 1 mL (1,000 mcg total) into the muscle every 30 (thirty) days. weekly 30 mL 1  . famotidine (PEPCID) 20 MG tablet Take 20 mg by mouth 2 (two) times daily.    . fexofenadine (ALLEGRA) 180 MG tablet Take 180 mg by mouth daily as needed.     . gabapentin (NEURONTIN) 100 MG capsule Take 2 capsules (200 mg total) by mouth at bedtime. 60 capsule 3  . Insulin Pen Needle 29G X 54.9IY MISC 1 application by Does not apply route daily. 100 each 3  . MULTIPLE VITAMIN PO Take 1 tablet by mouth daily.     . Needle, Disp, (HUBER NEEDLE 22GX1-1/2") 22G X 1-1/2" MISC 1  application by Does not apply route as needed. 25 each 1  . NEEDLE, DISP, 25 G 25G X 5/8" MISC 1 application by Does not apply route daily. 200 each 0  . Omega-3 Fatty Acids (FISH OIL) 1000 MG CAPS Take 1 capsule by mouth daily.    . Syringe, Disposable, 1 ML MISC 1 application by Does not apply route daily. 200 each 0  . testosterone cypionate (DEPO-TESTOSTERONE) 200 MG/ML injection INJECT 0.5 MLS INTRAMUSCULARLY EVERY 14 DAYS 6 mL 1  . Vitamin D, Ergocalciferol, (DRISDOL) 50000 UNITS CAPS capsule Take 1 capsule (50,000 Units total) by mouth every 7 (seven) days. 12 capsule 0  .  clonazePAM (KLONOPIN) 0.5 MG tablet TAKE 1 TABLET TWICE DAILY AS NEEDED (Patient not taking: Reported on 06/12/2014) 50 tablet 5  . EPIPEN 2-PAK 0.3 MG/0.3ML SOAJ injection USE AS DIRECTED (Patient not taking: Reported on 06/12/2014) 2 Device 0  . HYDROcodone-acetaminophen (NORCO/VICODIN) 5-325 MG per tablet Take 0.5-1 tablets by mouth 2 (two) times daily as needed (No more than once a week use.). (Patient not taking: Reported on 06/12/2014) 30 tablet 0   Objective: BP 140/78 mmHg  Pulse 75  Temp(Src) 98 F (36.7 C)  Wt 187 lb (84.823 kg) Gen: NAD, resting comfortably on table CV: RRR no murmurs rubs or gallops Lungs: CTAB no crackles, wheeze, rhonchi Abdomen: soft/nontender/nondistended/normal bowel sounds.  Ext: no edema Skin: warm, dry, no rash Neuro: grossly normal, moves all extremities  Assessment/Plan:  Essential hypertension Improved to 919 systolic today from 166 last visit. We discussed 5 lbs weight loss likely helped. Reinforced DASH diet and advised to start exercising. Hopeful we can avoid medication here. If persistently above 140 would plan on rx.    Hereditary and idiopathic peripheral neuropathy Continue gabapentin. We discussed cymbalta but patient nervous about side effects and does not want to take. I discussed with him not wanting to escalate narcotic usage and to continue to limit to 1-2x a week.    Generalized anxiety disorder Continue wellbutrin (thought not really ideal for anxiety) as patients concerned about erectile function as well as weight. Klonopin at 8-10x a week when previously 6-8. We discussed maxing at 8 per week. If he has escalating need, we will need to really push for counseling or psychiatry as I do not rx daily benzos.    4-6 month follow up.   Meds ordered this encounter  Medications  . HYDROcodone-acetaminophen (NORCO/VICODIN) 5-325 MG per tablet    Sig: Take 0.5-1 tablets by mouth 2 (two) times daily as needed (No more than 1-2x a week  use.).    Dispense:  30 tablet    Refill:  0

## 2014-06-12 NOTE — Assessment & Plan Note (Signed)
Continue gabapentin. We discussed cymbalta but patient nervous about side effects and does not want to take. I discussed with him not wanting to escalate narcotic usage and to continue to limit to 1-2x a week.

## 2014-06-12 NOTE — Patient Instructions (Addendum)
I'm jealous of your excellent cholesterol!   Blood pressure looks better over last 3 readings compared to last visit here. Your weight is down 5 lbs likely contributing to improvement. Glad the sun is coming out more so hopefully you can get some more exercise in and consistently be below 897 systolic.   Refilled Norco-goal 1-2x a week maximum Klonopin-max 8x a week. Really need to consider psychiatry if we are going to use daily benzodiazepines. Exercise may help with anxiety as well as you get back in the groove  Check in 6 months  Health Maintenance Due  Topic Date Due  . HIV Screening - with next bloodwork-national recs 05/18/1965

## 2014-06-12 NOTE — Assessment & Plan Note (Signed)
Improved to 384 systolic today from 536 last visit. We discussed 5 lbs weight loss likely helped. Reinforced DASH diet and advised to start exercising. Hopeful we can avoid medication here. If persistently above 140 would plan on rx.

## 2014-06-12 NOTE — Assessment & Plan Note (Signed)
Continue wellbutrin (thought not really ideal for anxiety) as patients concerned about erectile function as well as weight. Klonopin at 8-10x a week when previously 6-8. We discussed maxing at 8 per week. If he has escalating need, we will need to really push for counseling or psychiatry as I do not rx daily benzos.

## 2014-06-18 ENCOUNTER — Ambulatory Visit (INDEPENDENT_AMBULATORY_CARE_PROVIDER_SITE_OTHER): Payer: 59 | Admitting: Family Medicine

## 2014-06-18 ENCOUNTER — Encounter: Payer: Self-pay | Admitting: Family Medicine

## 2014-06-18 DIAGNOSIS — M79671 Pain in right foot: Secondary | ICD-10-CM

## 2014-06-18 NOTE — Progress Notes (Signed)
Pre visit review using our clinic review tool, if applicable. No additional management support is needed unless otherwise documented below in the visit note. 

## 2014-06-18 NOTE — Assessment & Plan Note (Signed)
Please see patient instructions. Hopefully this will help with some of the peripheral neuropathy as well. Patient will come back in 2-4 weeks for further evaluation and treatment.

## 2014-06-18 NOTE — Progress Notes (Signed)
Patient was fitted for a : standard, cushioned, semi-rigid orthotic. The orthotic was heated and afterward the patient was in a seated position and the orthotic molded. The patient was positioned in subtalar neutral position and 10 degrees of ankle dorsiflexion in a non-weight bearing stance. After completion of molding, patient did have orthotic management which included instructions on acclimating to the orthotics, signs of ill fit as well as care for the orthotic.   The blank was ground to a stable position for weight bearing. Size: 10.5 ([employer]li Comfort) Base: Carbon fiber Additional Posting and Padding: The following postings were fitted onto the molded orthotics to help maintain a talar neutral position - Wedge posting for transverse arch: 332/95   Silicone posting for longitudinal arch: Right - 200/120 and 200/100    Left 200/100 x2  The patient ambulated these, and they were very comfortable and supportive. The patient previously had [employer] orthotics made and will transition between his old orthotics and the new Catering manager.

## 2014-06-18 NOTE — Patient Instructions (Signed)
Acclimating to your Igli orthotics -   We recommend that you allow up to 2 weeks for you to fully acclimate to your new custom orthotics. Please use the following recommended plan to build into full day wear.   Day 1 - 4hours/day Every day afterwards add 1 hour of wear(5hrs/day, 6hrs/day, etc) until you are able to wear them for an entire day without issues.   If you notice any irritation or increasing discomfort with your new orthotics, please do not hesitate in contacting the office(leave a message for Jeral Fruit or Ria Comment) or sending Jody a message through [facility] to arrange a time to review your fit.   Enjoy your new orthotics!!   Dora Sims

## 2014-06-19 ENCOUNTER — Other Ambulatory Visit (INDEPENDENT_AMBULATORY_CARE_PROVIDER_SITE_OTHER): Payer: Self-pay | Admitting: Internal Medicine

## 2014-06-25 ENCOUNTER — Encounter: Payer: Self-pay | Admitting: Family Medicine

## 2014-06-25 ENCOUNTER — Other Ambulatory Visit: Payer: Self-pay

## 2014-06-25 MED ORDER — EPINEPHRINE 0.3 MG/0.3ML IJ SOAJ: INTRAMUSCULAR | Status: DC

## 2014-07-05 ENCOUNTER — Other Ambulatory Visit: Payer: Self-pay | Admitting: *Deleted

## 2014-07-05 MED ORDER — EPINEPHRINE 0.3 MG/0.3ML IJ SOAJ: INTRAMUSCULAR | Status: DC

## 2014-07-09 ENCOUNTER — Other Ambulatory Visit: Payer: Self-pay | Admitting: Family Medicine

## 2014-07-09 ENCOUNTER — Other Ambulatory Visit (INDEPENDENT_AMBULATORY_CARE_PROVIDER_SITE_OTHER): Payer: 59

## 2014-07-09 DIAGNOSIS — E291 Testicular hypofunction: Secondary | ICD-10-CM

## 2014-07-09 DIAGNOSIS — R7989 Other specified abnormal findings of blood chemistry: Secondary | ICD-10-CM

## 2014-07-09 DIAGNOSIS — R5382 Chronic fatigue, unspecified: Secondary | ICD-10-CM

## 2014-07-09 LAB — TESTOSTERONE: Testosterone: 136.45 ng/dL — ABNORMAL LOW (ref 300.00–890.00)

## 2014-07-10 LAB — CORTISOL, FREE

## 2014-07-11 ENCOUNTER — Encounter: Payer: Self-pay | Admitting: Family Medicine

## 2014-07-16 ENCOUNTER — Ambulatory Visit (INDEPENDENT_AMBULATORY_CARE_PROVIDER_SITE_OTHER): Payer: 59 | Admitting: Family Medicine

## 2014-07-16 ENCOUNTER — Encounter: Payer: Self-pay | Admitting: Family Medicine

## 2014-07-16 ENCOUNTER — Other Ambulatory Visit: Payer: Self-pay | Admitting: *Deleted

## 2014-07-16 VITALS — BP 120/82 | HR 68 | Ht 68.0 in | Wt 186.0 lb

## 2014-07-16 DIAGNOSIS — E279 Disorder of adrenal gland, unspecified: Secondary | ICD-10-CM | POA: Diagnosis not present

## 2014-07-16 DIAGNOSIS — R7989 Other specified abnormal findings of blood chemistry: Secondary | ICD-10-CM

## 2014-07-16 DIAGNOSIS — E291 Testicular hypofunction: Secondary | ICD-10-CM | POA: Diagnosis not present

## 2014-07-16 MED ORDER — TESTOSTERONE CYPIONATE 200 MG/ML IM SOLN: 200.0000 mg | INTRAMUSCULAR | Status: DC

## 2014-07-16 NOTE — Assessment & Plan Note (Signed)
Discussed with patient again at great length. We are going to increase the dose to 200 mg every 2 weeks. We will do better on the timing of the testosterone checking. We will check again in 3 months at the higher dose. In addition to this we will check a PSA because it has been 6 months since starting medicine.

## 2014-07-16 NOTE — Assessment & Plan Note (Signed)
Will recheck labs again in 3 months. Patient will continue with the same regimen at this time.

## 2014-07-16 NOTE — Progress Notes (Signed)
Pre visit review using our clinic review tool, if applicable. No additional management support is needed unless otherwise documented below in the visit note. 

## 2014-07-16 NOTE — Progress Notes (Signed)
  Subjective:    CC: Peripheral neuropathy follow up low testosterone.    Patient is following up for his peripheral neuropathy. Patient was started on B12 injections and was found to have a low testosterone.  Patient increase his testosterone injections again to every 2 weeks. Patient's testosterone though did decrease significantly while patient has been on the medicine. Patient is doing the [place] and we attempted to get cortisol levels but unfortunately the lab was unable to get a value.  Past medical history of tumor in the lungs.  Patient was seen by oncologist. Patient has been doing well and does not have any significant difficulty.  Patient's main concern has been continued peripheral neuropathy. Patient is on gabapentin. Patient continues the vitamin D supplementation, over-the-counter medications we discussed previously. Patient was doing a testosterone and was noticing some mild improvement in patient is in custom orthotics. Patient states that he is doing much better overall. Patient has been doing some review of testosterone supplementation and has felt better on it and would like to increase the dose. Patient states that his wife has noticed that his been doing a lot more activity around the house.   Past medical history, Surgical history, Family history not pertinant except as noted below, Social history, Allergies, and medications have been entered into the medical record, reviewed, and no changes needed.   Review of Systems: No fevers, chills, night sweats, weight loss, chest pain, or shortness of breath.   Objective:   Blood pressure 120/82, pulse 68, height 5\' 8"  (1.727 m), weight 186 lb (84.369 kg), SpO2 96 %.  General: Well Developed, well nourished, and in no acute distress.  Neuro: Alert and oriented x3, extra-ocular muscles intact, sensation grossly intact.  HEENT: Normocephalic, atraumatic, pupils equal round reactive to light, neck supple, no masses, no  lymphadenopathy, thyroid nonpalpable.  Skin: Warm and dry, no rashes. Cardiac:  no lower extremity edema. Respiratory: Not using accessory muscles, speaking in full sentences. Abdominal: NT, soft Prostate exam shows the patient has no enlargement. No nodules present on rectal exam Gait: Nonantlagic, good balance and coordination Lymphatic: no lymphadenopathy in neck or axillae on palpation, non tender.  Musculoskeletal: Inspection and palpation of the right and left upper extremities including the shoulders elbows and wrist are unremarkable with full range of motion and good muscle strength and tone. Inspection and palpation of the right and left lower extremities including the hips knees and ankles are unremarkable and nontender with full range of motion and good muscle strength and tone and are symmetric. Foot exam is nontender on exam. Patient does appear to be neurovascularly intact with 5 out of 5 strength of the ankles bilaterally.     Impression and Recommendations:

## 2014-07-16 NOTE — Patient Instructions (Signed)
Good to see you Increase the testosterone to 200mg  every 2 weeks We will check testosterone and PSA in 3 months.  We can try cortisol and DHEA and that time as well. Consider check pituitary.  I will put them in  Try changing the dots on the orthotics when needed I am impressed overall continue what you are doing and don't let your wife make you do all the housework.  See me after the blood draw. See you in 3 months.

## 2014-07-18 ENCOUNTER — Encounter: Payer: Self-pay | Admitting: Family Medicine

## 2014-08-16 ENCOUNTER — Other Ambulatory Visit: Payer: Self-pay | Admitting: Internal Medicine

## 2014-08-20 ENCOUNTER — Other Ambulatory Visit: Payer: Self-pay | Admitting: *Deleted

## 2014-08-20 MED ORDER — BUPROPION HCL ER (XL) 300 MG PO TB24: ORAL_TABLET | ORAL | Status: DC

## 2014-09-03 ENCOUNTER — Emergency Department (HOSPITAL_COMMUNITY)
Admission: EM | Admit: 2014-09-03 | Discharge: 2014-09-03 | Disposition: A | Discharge status: Home / Self Care | Payer: 59 | Source: Home / Self Care | Attending: Family Medicine | Admitting: Family Medicine

## 2014-09-03 ENCOUNTER — Encounter (HOSPITAL_COMMUNITY): Payer: Self-pay | Admitting: *Deleted

## 2014-09-03 DIAGNOSIS — H6982 Other specified disorders of Eustachian tube, left ear: Secondary | ICD-10-CM | POA: Diagnosis not present

## 2014-09-03 MED ORDER — IPRATROPIUM BROMIDE 0.06 % NA SOLN: 2.0000 | Freq: Four times a day (QID) | NASAL | Status: DC

## 2014-09-03 MED ORDER — PREDNISONE 50 MG PO TABS: ORAL_TABLET | ORAL | Status: DC

## 2014-09-03 NOTE — ED Provider Notes (Signed)
CSN: [id number]     Arrival date & time 09/03/14  1336 History   First MD Initiated Contact with Patient 09/03/14 1458     Chief Complaint  Patient presents with  . Ear Fullness   (Consider location/radiation/quality/duration/timing/severity/associated sxs/prior Treatment) Patient is a 64 y.o. male presenting with plugged ear sensation. The history is provided by the patient.  Ear Fullness This is a new problem. The current episode started more than 1 week ago (recent uri / sinus sx with residual left ear congestion.). The problem has not changed since onset.   Past Medical History  Diagnosis Date  . Disturbance of skin sensation   . Other diseases of lung, not elsewhere classified   . Other abnormal blood chemistry   . Other testicular hypofunction   . Depressive disorder, not elsewhere classified   . Esophageal reflux   . Allergic rhinitis, cause unspecified   . Personal history of urinary calculi   . Lymphocytic colitis    Past Surgical History  Procedure Laterality Date  . Parotid tumor surgery    . Tonsillectomy and adenoidectomy    . 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 Early death Neg Hx   . Heart disease Neg Hx     mother in 58s had lesoin  . Hypertension Mother     father  . Dementia Father   . Depression Father     and anxiety  . Anxiety disorder Mother    History  Substance Use Topics  . Smoking status: Never Smoker   . Smokeless tobacco: Never Used  . Alcohol Use: Yes     Comment: rare occ    Review of Systems  Constitutional: Negative.   HENT: Positive for congestion, hearing loss, postnasal drip and rhinorrhea. Negative for ear discharge, ear pain and facial swelling.   Respiratory: Negative.   Cardiovascular: Negative.     Allergies  Bee venom; Flagyl; and Nsaids  Home Medications   Prior to Admission medications   Medication Sig Start Date End  Date Taking? Authorizing Provider  Ascorbic Acid (VITAMIN C PO) Take by mouth.    Historical Provider, MD  aspirin 81 MG tablet Take 81 mg by mouth daily.    Historical Provider, MD  b complex vitamins tablet Take 1 tablet by mouth daily.      Historical Provider, MD  buPROPion (WELLBUTRIN XL) 300 MG 24 hr tablet TAKE 1 TABLET BY MOUTH EVERY DAY 08/20/14   Marin Olp, MD  Cholecalciferol (VITAMIN D) 2000 UNITS CAPS Take 1 capsule by mouth daily.     Historical Provider, MD  clonazePAM (KLONOPIN) 0.5 MG tablet TAKE 1 TABLET TWICE DAILY AS NEEDED 03/12/14   Marin Olp, MD  cyanocobalamin (,VITAMIN B-12,) 1000 MCG/ML injection Inject 1 mL (1,000 mcg total) into the muscle every 30 (thirty) days. weekly 08/28/13   Lyndal Pulley, DO  EPINEPHrine (EPIPEN 2-PAK) 0.3 mg/0.3 mL IJ SOAJ injection USE AS NEEDED 07/05/14   Lyndal Pulley, DO  famotidine (PEPCID) 20 MG tablet Take 20 mg by mouth 2 (two) times daily.    Historical Provider, MD  fexofenadine (ALLEGRA) 180 MG tablet Take 180 mg by mouth daily as needed.  06/15/11   Neena Rhymes, MD  gabapentin (NEURONTIN) 100 MG capsule Take 2 capsules (200 mg total) by mouth at bedtime. 06/05/14   Lyndal Pulley, DO  HYDROcodone-acetaminophen (NORCO/VICODIN)  5-325 MG per tablet Take 0.5-1 tablets by mouth 2 (two) times daily as needed (No more than 1-2x a week use.). 06/12/14   Marin Olp, MD  Insulin Pen Needle 29G X 00.7HQ MISC 1 application by Does not apply route daily. 01/12/13   Lyndal Pulley, DO  ipratropium (ATROVENT) 0.06 % nasal spray Place 2 sprays into the nose 4 (four) times daily. 09/03/14   Billy Fischer, MD  MULTIPLE VITAMIN PO Take 1 tablet by mouth daily.     Historical Provider, MD  Needle, Disp, (HUBER NEEDLE 22GX1-1/2") 22G X 1-9/7" MISC 1 application by Does not apply route as needed. 03/06/14   Lyndal Pulley, DO  NEEDLE, DISP, 25 G 25G X 5/8" MISC 1 application by Does not apply route daily. 03/06/14   Lyndal Pulley, DO   Omega-3 Fatty Acids (FISH OIL) 1000 MG CAPS Take 1 capsule by mouth daily.    Historical Provider, MD  predniSONE (DELTASONE) 50 MG tablet 1 tab on wed and thurs with 1/2 tab on fri and sat. 09/03/14   Billy Fischer, MD  Syringe, Disposable, 1 ML MISC 1 application by Does not apply route daily. 03/06/14   Lyndal Pulley, DO  testosterone cypionate (DEPO-TESTOSTERONE) 200 MG/ML injection Inject 1 mL (200 mg total) into the muscle every 14 (fourteen) days. 07/16/14   Lyndal Pulley, DO  Vitamin D, Ergocalciferol, (DRISDOL) 50000 UNITS CAPS capsule Take 1 capsule (50,000 Units total) by mouth every 7 (seven) days. 05/08/14   Lyndal Pulley, DO   BP 154/97 mmHg  Pulse 80  Temp(Src) 98.2 F (36.8 C) (Oral)  Resp 16  SpO2 98% Physical Exam  Constitutional: He is oriented to person, place, and time. He appears well-developed and well-nourished. No distress.  HENT:  Head: Normocephalic.  Right Ear: External ear normal.  Left Ear: External ear normal.  Nose: Nose normal.  Mouth/Throat: Oropharynx is clear and moist.  Eyes: Conjunctivae are normal. Pupils are equal, round, and reactive to light.  Neck: Normal range of motion. Neck supple.  Cardiovascular: Normal rate, normal heart sounds and intact distal pulses.   Pulmonary/Chest: Effort normal and breath sounds normal.  Lymphadenopathy:    He has no cervical adenopathy.  Neurological: He is alert and oriented to person, place, and time.  Skin: Skin is warm and dry.  Nursing note and vitals reviewed.   ED Course  Procedures (including critical care time) Labs Review Labs Reviewed - No data to display  Imaging Review No results found.   MDM   1. ETD (eustachian tube dysfunction), left        Billy Fischer, MD 09/03/14 1531

## 2014-09-03 NOTE — ED Notes (Signed)
Pt reports   Symptoms of   Sinus  Congestion  /   Drainage         With   Cough          l  Ear  Fullness         Symptoms   X    sev    Weeks         symptoms  Not  releived  By  otc  meds           Pt is  Not  Dizzy          He  Is  Sitting  Upright on  The  Exam table  Speaking  In  Complete  sentances

## 2014-09-05 ENCOUNTER — Other Ambulatory Visit: Payer: Self-pay | Admitting: Family Medicine

## 2014-09-05 NOTE — Telephone Encounter (Signed)
May fill but please state may refill once a month #36. Had discussed with patient limiting use to 8 a week and if escalating dose, needs to see behavioral health or psychiatry for additional support

## 2014-09-05 NOTE — Telephone Encounter (Signed)
Ok to refill 

## 2014-10-08 ENCOUNTER — Other Ambulatory Visit: Payer: Self-pay | Admitting: Family Medicine

## 2014-10-08 NOTE — Telephone Encounter (Signed)
Yes thanks 

## 2014-10-08 NOTE — Telephone Encounter (Signed)
Ok to refill 

## 2014-10-09 ENCOUNTER — Other Ambulatory Visit (INDEPENDENT_AMBULATORY_CARE_PROVIDER_SITE_OTHER): Payer: 59

## 2014-10-09 DIAGNOSIS — E291 Testicular hypofunction: Secondary | ICD-10-CM

## 2014-10-09 DIAGNOSIS — R7989 Other specified abnormal findings of blood chemistry: Secondary | ICD-10-CM

## 2014-10-09 LAB — CORTISOL: CORTISOL PLASMA: 7.9 ug/dL

## 2014-10-09 LAB — PSA: PSA: 0.33 ng/mL (ref 0.10–4.00)

## 2014-10-09 LAB — TESTOSTERONE: TESTOSTERONE: 992.85 ng/dL — AB (ref 300.00–890.00)

## 2014-10-12 LAB — DHEA: DHEA: 577 ng/dL (ref 61–1636)

## 2014-10-14 ENCOUNTER — Encounter: Payer: Self-pay | Admitting: Family Medicine

## 2014-10-15 ENCOUNTER — Ambulatory Visit (INDEPENDENT_AMBULATORY_CARE_PROVIDER_SITE_OTHER): Payer: 59 | Admitting: Family Medicine

## 2014-10-15 ENCOUNTER — Encounter: Payer: Self-pay | Admitting: Family Medicine

## 2014-10-15 VITALS — BP 136/80 | HR 77 | Ht 68.0 in | Wt 190.0 lb

## 2014-10-15 DIAGNOSIS — E291 Testicular hypofunction: Secondary | ICD-10-CM

## 2014-10-15 DIAGNOSIS — E279 Disorder of adrenal gland, unspecified: Secondary | ICD-10-CM

## 2014-10-15 DIAGNOSIS — R7989 Other specified abnormal findings of blood chemistry: Secondary | ICD-10-CM

## 2014-10-15 DIAGNOSIS — G609 Hereditary and idiopathic neuropathy, unspecified: Secondary | ICD-10-CM

## 2014-10-15 MED ORDER — TESTOSTERONE CYPIONATE 200 MG/ML IM SOLN
200.0000 mg | INTRAMUSCULAR | Status: DC
Start: 2014-10-15 — End: 2015-04-15

## 2014-10-15 MED ORDER — "SYRINGE/NEEDLE (DISP) 22G X 1-1/2"" 3 ML MISC": Status: DC

## 2014-10-15 MED ORDER — "NEEDLE (DISP) 23G X 1-1/2"" MISC": Status: DC

## 2014-10-15 MED ORDER — GABAPENTIN 100 MG PO CAPS: 200.0000 mg | ORAL_CAPSULE | Freq: Every day | ORAL | Status: DC

## 2014-10-15 MED ORDER — CYANOCOBALAMIN 1000 MCG/ML IJ SOLN: 1000.0000 ug | INTRAMUSCULAR | Status: DC

## 2014-10-15 NOTE — Progress Notes (Signed)
Pre visit review using our clinic review tool, if applicable. No additional management support is needed unless otherwise documented below in the visit note. 

## 2014-10-15 NOTE — Assessment & Plan Note (Signed)
Cortisol levels elevated back to 578. This is within normal range. Discussed proper cycling of the medication.

## 2014-10-15 NOTE — Assessment & Plan Note (Signed)
Patient is doing very well at this time. We'll continue with the supplementation but we will decrease to 200 mg alternating with 100 mg every 2 weeks. We will see if this below 850. Otherwise we'll continue to monitor and labs every 6 months.

## 2014-10-15 NOTE — Progress Notes (Signed)
  Subjective:    CC: Peripheral neuropathy follow up low testosterone.    Patient is following up for his peripheral neuropathy. Patient was started on B12 injections and was found to have a low testosterone.  Patient increase his testosterone injections again to every 2 weeks. Patient's assess her own is greater than 992. Patient states he is having more energy though. Patient is feeling better overall. Patient states that he does not feel like he needs Tomi Bamberger as much. Patient has been more active and is actually losing weight. Patient is very happy with the results.  Past medical history of tumor in the lungs.  Patient was seen by oncologist. Patient has been doing well and does not have any significant difficulty.  Patient's main concern has been continued peripheral neuropathy. Patient is on gabapentin. Patient continues the vitamin D supplementation, over-the-counter medications we discussed previously. Patient was also having fatigue and has been doing some over-the-counter DHE a as well as cortisol. Patient is within normal range of these labs.patient states that the fatigue is much better and may be even some of the neuropathy is a little bit better. It is very manageable at this time.  DHEA 577<--281 Cortisol 7.9  Past medical history, Surgical history, Family history not pertinant except as noted below, Social history, Allergies, and medications have been entered into the medical record, reviewed, and no changes needed.   Review of Systems: No fevers, chills, night sweats, weight loss, chest pain, or shortness of breath.   Objective:   Blood pressure 136/80, pulse 77, height '5\' 8"'$  (1.727 m), weight 190 lb (86.183 kg), SpO2 94 %.  General: Well Developed, well nourished, and in no acute distress.  Neuro: Alert and oriented x3, extra-ocular muscles intact, sensation grossly intact.  HEENT: Normocephalic, atraumatic, pupils equal round reactive to light, neck supple, no masses, no  lymphadenopathy, thyroid nonpalpable.  Skin: Warm and dry, no rashes. Cardiac:  no lower extremity edema. Respiratory: Not using accessory muscles, speaking in full sentences. Abdominal: NT, soft Prostate exam shows the patient has no enlargement. No nodules present on rectal exam Gait: Nonantlagic, good balance and coordination Lymphatic: no lymphadenopathy in neck or axillae on palpation, non tender.  Musculoskeletal: Inspection and palpation of the right and left upper extremities including the shoulders elbows and wrist are unremarkable with full range of motion and good muscle strength and tone. Inspection and palpation of the right and left lower extremities including the hips knees and ankles are unremarkable and nontender with full range of motion and good muscle strength and tone and are symmetric. Foot exam is nontender on exam. Patient does appear to be neurovascularly intact with 5 out of 5 strength of the ankles bilaterally.  No Change from previous exam.   Impression and Recommendations:

## 2014-10-15 NOTE — Assessment & Plan Note (Signed)
Improving at this time will continue the medications as he tolerates it.

## 2014-10-15 NOTE — Patient Instructions (Addendum)
Good to see you Ice is your friend when needed Lets continue the medications except decrease testosterone to 200-mg  Then '100mg'$  alternating every 2 weeks Continue the gabapentin  DHEA daily for another 2 weeks then discontinue for 2 weeks, if fatigue comes back then start again for another 8 weeks.  See me again in 6 months and we will check labs again

## 2014-12-11 ENCOUNTER — Ambulatory Visit (HOSPITAL_COMMUNITY)
Admission: RE | Admit: 2014-12-11 | Discharge: 2014-12-11 | Disposition: A | Discharge status: Home / Self Care | Payer: 59 | Source: Ambulatory Visit | Attending: Internal Medicine | Admitting: Internal Medicine

## 2014-12-11 ENCOUNTER — Encounter (HOSPITAL_COMMUNITY): Payer: Self-pay

## 2014-12-11 ENCOUNTER — Other Ambulatory Visit (HOSPITAL_BASED_OUTPATIENT_CLINIC_OR_DEPARTMENT_OTHER): Payer: 59

## 2014-12-11 DIAGNOSIS — C3431 Malignant neoplasm of lower lobe, right bronchus or lung: Secondary | ICD-10-CM | POA: Insufficient documentation

## 2014-12-11 HISTORY — DX: Malignant (primary) neoplasm, unspecified: C80.1

## 2014-12-11 LAB — CBC WITH DIFFERENTIAL/PLATELET
BASO%: 0.3 % (ref 0.0–2.0)
Basophils Absolute: 0 10*3/uL (ref 0.0–0.1)
EOS ABS: 0.2 10*3/uL (ref 0.0–0.5)
EOS%: 2.8 % (ref 0.0–7.0)
HCT: 50.2 % — ABNORMAL HIGH (ref 38.4–49.9)
HGB: 17.6 g/dL — ABNORMAL HIGH (ref 13.0–17.1)
LYMPH%: 33.7 % (ref 14.0–49.0)
MCH: 30.9 pg (ref 27.2–33.4)
MCHC: 35.1 g/dL (ref 32.0–36.0)
MCV: 88.2 fL (ref 79.3–98.0)
MONO#: 0.9 10*3/uL (ref 0.1–0.9)
MONO%: 13.9 % (ref 0.0–14.0)
NEUT#: 3.2 10*3/uL (ref 1.5–6.5)
NEUT%: 49.3 % (ref 39.0–75.0)
Platelets: 203 10*3/uL (ref 140–400)
RBC: 5.69 10*6/uL (ref 4.20–5.82)
RDW: 12.7 % (ref 11.0–14.6)
WBC: 6.5 10*3/uL (ref 4.0–10.3)
lymph#: 2.2 10*3/uL (ref 0.9–3.3)

## 2014-12-11 LAB — COMPREHENSIVE METABOLIC PANEL (CC13)
ALT: 31 U/L (ref 0–55)
AST: 36 U/L — ABNORMAL HIGH (ref 5–34)
Albumin: 4.1 g/dL (ref 3.5–5.0)
Alkaline Phosphatase: 63 U/L (ref 40–150)
Anion Gap: 6 mEq/L (ref 3–11)
BILIRUBIN TOTAL: 1.99 mg/dL — AB (ref 0.20–1.20)
BUN: 15.7 mg/dL (ref 7.0–26.0)
CHLORIDE: 106 meq/L (ref 98–109)
CO2: 27 meq/L (ref 22–29)
Calcium: 9.4 mg/dL (ref 8.4–10.4)
Creatinine: 1.2 mg/dL (ref 0.7–1.3)
EGFR: 65 mL/min/{1.73_m2} — AB (ref >90)
GLUCOSE: 116 mg/dL (ref 70–140)
Potassium: 4.7 mEq/L (ref 3.5–5.1)
SODIUM: 139 meq/L (ref 136–145)
TOTAL PROTEIN: 6.6 g/dL (ref 6.4–8.3)

## 2014-12-17 ENCOUNTER — Encounter: Payer: Self-pay | Admitting: Family Medicine

## 2014-12-17 ENCOUNTER — Ambulatory Visit (INDEPENDENT_AMBULATORY_CARE_PROVIDER_SITE_OTHER): Payer: 59 | Admitting: Family Medicine

## 2014-12-17 VITALS — BP 140/80 | HR 76 | Temp 97.7°F | Wt 191.0 lb

## 2014-12-17 DIAGNOSIS — H6980 Other specified disorders of Eustachian tube, unspecified ear: Secondary | ICD-10-CM | POA: Insufficient documentation

## 2014-12-17 DIAGNOSIS — F411 Generalized anxiety disorder: Secondary | ICD-10-CM | POA: Diagnosis not present

## 2014-12-17 DIAGNOSIS — H6982 Other specified disorders of Eustachian tube, left ear: Secondary | ICD-10-CM | POA: Diagnosis not present

## 2014-12-17 DIAGNOSIS — G609 Hereditary and idiopathic neuropathy, unspecified: Secondary | ICD-10-CM

## 2014-12-17 DIAGNOSIS — Z23 Encounter for immunization: Secondary | ICD-10-CM | POA: Diagnosis not present

## 2014-12-17 DIAGNOSIS — I10 Essential (primary) hypertension: Secondary | ICD-10-CM | POA: Diagnosis not present

## 2014-12-17 MED ORDER — HYDROCODONE-ACETAMINOPHEN 5-325 MG PO TABS: 0.5000 | ORAL_TABLET | Freq: Two times a day (BID) | ORAL | Status: DC | PRN

## 2014-12-17 NOTE — Assessment & Plan Note (Addendum)
S:L ear- infection in May. Had gone swimming. Ear blocked up. Went to urgent care. Thought ETDPrednisone and nasal spray given. Only took the ipratropium. Off and on issues since that time. Worse with sweating. No long term hearing loss or tinnitus A/P: Discussed care options including Flonase or the ipratropium nasal spray. Gave handout. Gently blowing nose while plugged has been helpful.

## 2014-12-17 NOTE — Assessment & Plan Note (Signed)
S: Mild poor control.weight up 4 lbs. Not exercising.  BP Readings from Last 3 Encounters:  12/17/14 140/80  10/15/14 136/80  09/03/14 154/97  A/P: Discussed DASH diet and exercise. If remains high at follow-up would start medication. He prefers amlodipine. Discussed that exercise may also help his anxiety.

## 2014-12-17 NOTE — Assessment & Plan Note (Signed)
S:Neuropathy- did not take cymbalta. Gabapentin 300-'600mg'$  nightly, trialed down to 100 but symptoms worsened. Low vicodin use 1-2 a week. Given 30 pills 6 months ago A/P: Continue gabapentin. Refill Vicodin No. 30 for 6 months. Would consider refill depending on when or if he calls in.

## 2014-12-17 NOTE — Assessment & Plan Note (Signed)
S: Stressful time in his life.Cat sick- passed. Mom - moving to hospice with cancer. Moving in with him.  A/P: He continues Wellbutrin for depression. His Klonopin use is up from 6-8 per week to over 10. He would like to go to therapy but just doesn't have that time as trying to prepare the house for his mom. He is not interested in adding medication. We ultimately decided to try to push for therapy when his time schedule allows which he thinks will be in October when his work schedule should cut down.

## 2014-12-17 NOTE — Patient Instructions (Addendum)
Flu shot given today.  Pneumonia shot at 6 month follow up  Sorry extra stressors at home- we opted to hold on medication changes and consider therapy when things calm down time wise  Blood pressure hair high, if continues high at follow up, likely start low dose medication. DASH diet. Have to find a way to get exercise back in  Refilled norco for sparing use 1-2 times a week  Health Maintenance Due  Topic Date Due  . Hepatitis C Screening  [birth year]  . HIV Screening  05/18/1965  RPR  Next visit for regular screening and due to neruopathy     Barotitis Media Barotitis media is inflammation of your middle ear. This occurs when the auditory tube (eustachian tube) leading from the back of your nose (nasopharynx) to your eardrum is blocked. This blockage may result from a cold, environmental allergies, or an upper respiratory infection. Unresolved barotitis media may lead to damage or hearing loss (barotrauma), which may become permanent. HOME CARE INSTRUCTIONS   Use medicines as recommended by your health care provider. Over-the-counter medicines will help unblock the canal and can help during times of air travel.  Do not put anything into your ears to clean or unplug them. Eardrops will not be helpful.  Do not swim, dive, or fly until your health care provider says it is all right to do so. If these activities are necessary, chewing gum with frequent, forceful swallowing may help. It is also helpful to hold your nose and gently blow to pop your ears for equalizing pressure changes. This forces air into the eustachian tube.  Only take over-the-counter or prescription medicines for pain, discomfort, or fever as directed by your health care provider.  A decongestant may be helpful in decongesting the middle ear and make pressure equalization easier. SEEK MEDICAL CARE IF:  You experience a serious form of dizziness in which you feel as if the room is spinning and you feel nauseated  (vertigo).  Your symptoms only involve one ear. SEEK IMMEDIATE MEDICAL CARE IF:   You develop a severe headache, dizziness, or severe ear pain.  You have bloody or pus-like drainage from your ears.  You develop a fever.  Your problems do not improve or become worse. MAKE SURE YOU:   Understand these instructions.  Will watch your condition.  Will get help right away if you are not doing well or get worse. Document Released: 03/19/2000 Document Revised: 01/10/2013 Document Reviewed: 10/17/2012 [facility] Patient Information 2015 [place], [place]. This information is not intended to replace advice given to you by your health care provider. Make sure you discuss any questions you have with your health care provider.

## 2014-12-17 NOTE — Progress Notes (Signed)
[name] Reddish, MD  Subjective:  [name] Page is a 64 y.o. year old very pleasant male patient who presents for/with See problem oriented charting ROS- intermittent popping in left ear. No chest pain or shortness of breath. No headache or blurred vision.  Past Medical History-  Patient Active Problem List   Diagnosis Date Noted  . Lung cancer 12/11/2012    Priority: High  . Generalized anxiety disorder 03/12/2014    Priority: Medium  . Essential hypertension 03/12/2014    Priority: Medium  . Hereditary and idiopathic peripheral neuropathy 12/22/2012    Priority: Medium  . Dyspnea 07/14/2012    Priority: Medium  . Hyperlipidemia 07/14/2012    Priority: Medium  . Depression 02/16/2010    Priority: Medium  . Low testosterone 11/21/2007    Priority: Medium  . Eustachian tube dysfunction 12/17/2014    Priority: Low  . Nonspecific abnormal electrocardiogram (ECG) (EKG) 07/14/2012    Priority: Low  . Allergic reaction, history of 10/25/2011    Priority: Low  . Rectal or anal pain 10/01/2010    Priority: Low  . Allergic rhinitis 06/13/2007    Priority: Low  . GERD 06/13/2007    Priority: Low  . IBS (irritable bowel syndrome) 06/13/2007    Priority: Low  . 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 Peroneal tendonitis of right lower extremity 05/08/2014  . SI (sacroiliac) joint dysfunction 09/19/2013  . Nonallopathic lesion of sacral region 09/19/2013  . Nonallopathic lesion of thoracic region 09/19/2013  . Nonallopathic lesion of lumbosacral region 09/19/2013  . Right foot pain 12/22/2012    Medications- reviewed and updated Current Outpatient Prescriptions  Medication Sig Dispense Refill  . Ascorbic Acid (VITAMIN C PO) Take by mouth.    Marland Kitchen aspirin 81 MG tablet Take 81 mg by mouth daily.    Marland Kitchen b complex vitamins tablet Take 1 tablet by mouth daily.      Marland Kitchen buPROPion (WELLBUTRIN XL) 300 MG 24 hr tablet TAKE 1 TABLET BY MOUTH EVERY DAY 90 tablet 3  . Cholecalciferol (VITAMIN D)  2000 UNITS CAPS Take 1 capsule by mouth daily.     . cyanocobalamin (,VITAMIN B-12,) 1000 MCG/ML injection Inject 1 mL (1,000 mcg total) into the muscle every 30 (thirty) days. weekly 30 mL 1  . famotidine (PEPCID) 20 MG tablet Take 20 mg by mouth 2 (two) times daily.    . fexofenadine (ALLEGRA) 180 MG tablet Take 180 mg by mouth daily as needed.     . gabapentin (NEURONTIN) 100 MG capsule Take 2 capsules (200 mg total) by mouth at bedtime. 180 capsule 1  . Insulin Pen Needle 29G X 48.1EH MISC 1 application by Does not apply route daily. 100 each 3  . ipratropium (ATROVENT) 0.06 % nasal spray Place 2 sprays into the nose 4 (four) times daily. 15 mL 12  . MULTIPLE VITAMIN PO Take 1 tablet by mouth daily.     Marland Kitchen NEEDLE, DISP, 23 G 23G X 1-1/2" MISC Use as directed 100 each 3  . Omega-3 Fatty Acids (FISH OIL) 1000 MG CAPS Take 1 capsule by mouth daily.    . SYRINGE-NEEDLE, DISP, 3 ML 22G X 1-1/2" 3 ML MISC Use as directed. 100 each 3  . testosterone cypionate (DEPO-TESTOSTERONE) 200 MG/ML injection Inject 1 mL (200 mg total) into the muscle every 14 (fourteen) days. 10 mL 5  . Vitamin D, Ergocalciferol, (DRISDOL) 50000 UNITS CAPS capsule Take 1 capsule (50,000 Units total) by mouth every 7 (seven) days. 12 capsule  0  . clonazePAM (KLONOPIN) 0.5 MG tablet TAKE 1 TABLET BY MOUTH TWICE A DAY AS NEEDED (Patient not taking: Reported on 12/17/2014) 60 tablet 1  . EPINEPHrine (EPIPEN 2-PAK) 0.3 mg/0.3 mL IJ SOAJ injection USE AS NEEDED (Patient not taking: Reported on 12/17/2014) 2 Device 1  . HYDROcodone-acetaminophen (NORCO/VICODIN) 5-325 MG per tablet Take 0.5-1 tablets by mouth 2 (two) times daily as needed (No more than 1-2x a week use.). 30 tablet 0   No current facility-administered medications for this visit.    Objective: BP 140/80 mmHg  Pulse 76  Temp(Src) 97.7 F (36.5 C)  Wt 191 lb (86.637 kg) Gen: NAD, resting comfortably Tympanic membrane normal bilaterally, mild amount of nonobstructive  wax CV: RRR no murmurs rubs or gallops Lungs: CTAB no crackles, wheeze, rhonchi Abdomen: soft/nontender/nondistended/normal bowel sounds. No rebound or guarding.  Ext: no edema Skin: warm, dry Neuro: grossly normal, moves all extremities  Assessment/Plan:  Eustachian tube dysfunction S:L ear- infection in May. Had gone swimming. Ear blocked up. Went to urgent care. Thought ETDPrednisone and nasal spray given. Only took the ipratropium. Off and on issues since that time. Worse with sweating. No long term hearing loss or tinnitus A/P: Discussed care options including Flonase or the ipratropium nasal spray. Gave handout. Gently blowing nose while plugged has been helpful.   Generalized anxiety disorder S: Stressful time in his life.Cat sick- passed. Mom - moving to hospice with cancer. Moving in with him.  A/P: He continues Wellbutrin for depression. His Klonopin use is up from 6-8 per week to over 10. He would like to go to therapy but just doesn't have that time as trying to prepare the house for his mom. He is not interested in adding medication. We ultimately decided to try to push for therapy when his time schedule allows which he thinks will be in October when his work schedule should cut down.  Essential hypertension S: Mild poor control.weight up 4 lbs. Not exercising.  BP Readings from Last 3 Encounters:  12/17/14 140/80  10/15/14 136/80  09/03/14 154/97  A/P: Discussed DASH diet and exercise. If remains high at follow-up would start medication. He prefers amlodipine. Discussed that exercise may also help his anxiety.   Hereditary and idiopathic peripheral neuropathy S:Neuropathy- did not take cymbalta. Gabapentin 300-'600mg'$  nightly, trialed down to 100 but symptoms worsened. Low vicodin use 1-2 a week. Given 30 pills 6 months ago A/P: Continue gabapentin. Refill Vicodin No. 30 for 6 months. Would consider refill depending on when or if he calls in.   6 months follow up  Rpr,  hiv, hep c next visit  Orders Placed This Encounter  Procedures  . Flu Vaccine QUAD 36+ mos IM   Meds ordered this encounter  Medications  . HYDROcodone-acetaminophen (NORCO/VICODIN) 5-325 MG per tablet    Sig: Take 0.5-1 tablets by mouth 2 (two) times daily as needed (No more than 1-2x a week use.).    Dispense:  30 tablet    Refill:  0

## 2014-12-18 ENCOUNTER — Encounter: Payer: Self-pay | Admitting: Family Medicine

## 2014-12-18 ENCOUNTER — Ambulatory Visit (HOSPITAL_BASED_OUTPATIENT_CLINIC_OR_DEPARTMENT_OTHER): Payer: 59 | Admitting: Internal Medicine

## 2014-12-18 ENCOUNTER — Encounter: Payer: Self-pay | Admitting: Internal Medicine

## 2014-12-18 ENCOUNTER — Telehealth: Payer: Self-pay | Admitting: Internal Medicine

## 2014-12-18 VITALS — BP 155/78 | HR 79 | Temp 97.9°F | Resp 18 | Ht 68.0 in | Wt 190.6 lb

## 2014-12-18 DIAGNOSIS — C3411 Malignant neoplasm of upper lobe, right bronchus or lung: Secondary | ICD-10-CM

## 2014-12-18 NOTE — Telephone Encounter (Signed)
Pt confirmed labs/ov per 09/14 POF, gave pt AVS and Calendar.... KJ

## 2014-12-18 NOTE — Progress Notes (Signed)
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  INTERVAL HISTORY: [name] Page 64 y.o. male returns to the clinic today for annual followup visit. The patient is feeling fine today with no specific complaints. He was recently started by his primary care physician on testosterone treatment for Androgen deficiency. He denied having any significant chest pain, shortness of breath, cough or hemoptysis. He denied having any weight loss or night sweats. He has repeat CT scan of the chest performed recently and he is here for evaluation and discussion of his scan results.  MEDICAL HISTORY: Past Medical History  Diagnosis Date  . Disturbance of skin sensation   . Other diseases of lung, not elsewhere classified   . Other abnormal blood chemistry   . Other testicular hypofunction   . Depressive disorder, not elsewhere classified   . Esophageal reflux   . Allergic rhinitis, cause unspecified   . Personal history of urinary calculi   . Lymphocytic colitis   . lung ca dx'd 09/2009    surg only    ALLERGIES:  is allergic to bee venom; flagyl; and nsaids.  MEDICATIONS:  Current Outpatient Prescriptions  Medication Sig Dispense Refill  . Ascorbic Acid (VITAMIN C PO) Take by mouth.    Marland Kitchen aspirin 81 MG tablet Take 81 mg by mouth daily.    Marland Kitchen b complex vitamins tablet Take 1 tablet by mouth daily.      Marland Kitchen buPROPion (WELLBUTRIN XL) 300 MG 24 hr tablet TAKE 1 TABLET BY MOUTH EVERY DAY 90 tablet 3  . Cholecalciferol (VITAMIN D) 2000 UNITS CAPS Take 1 capsule by mouth daily.     . clonazePAM (KLONOPIN) 0.5 MG tablet TAKE 1 TABLET BY MOUTH TWICE A DAY AS NEEDED (Patient not  taking: Reported on 12/17/2014) 60 tablet 1  . cyanocobalamin (,VITAMIN B-12,) 1000 MCG/ML injection Inject 1 mL (1,000 mcg total) into the muscle every 30 (thirty) days. weekly 30 mL 1  . EPINEPHrine (EPIPEN 2-PAK) 0.3 mg/0.3 mL IJ SOAJ injection USE AS NEEDED (Patient not taking: Reported on 12/17/2014) 2 Device 1  . famotidine (PEPCID) 20 MG tablet Take 20 mg by mouth 2 (two) times daily.    . fexofenadine (ALLEGRA) 180 MG tablet Take 180 mg by mouth daily as needed.     . gabapentin (NEURONTIN) 100 MG capsule Take 2 capsules (200 mg total) by mouth at bedtime. 180 capsule 1  . HYDROcodone-acetaminophen (NORCO/VICODIN) 5-325 MG per tablet Take 0.5-1 tablets by mouth 2 (two) times daily as needed (No more than 1-2x a week use.). 30 tablet 0  . Insulin Pen Needle 29G X 41.6LA MISC 1 application by Does not apply route daily. 100 each 3  . ipratropium (ATROVENT) 0.06 % nasal spray Place 2 sprays into the nose 4 (four) times daily. 15 mL 12  . MULTIPLE VITAMIN PO Take 1 tablet by mouth daily.     Marland Kitchen NEEDLE, DISP, 23 G 23G X 1-1/2" MISC Use as directed 100 each 3  . Omega-3 Fatty Acids (FISH OIL) 1000 MG CAPS Take 1 capsule by mouth daily.    . SYRINGE-NEEDLE, DISP, 3 ML 22G X 1-1/2" 3 ML MISC Use as directed.  100 each 3  . testosterone cypionate (DEPO-TESTOSTERONE) 200 MG/ML injection Inject 1 mL (200 mg total) into the muscle every 14 (fourteen) days. 10 mL 5  . Vitamin D, Ergocalciferol, (DRISDOL) 50000 UNITS CAPS capsule Take 1 capsule (50,000 Units total) by mouth every 7 (seven) days. 12 capsule 0   No current facility-administered medications for this visit.    SURGICAL HISTORY:  Past Surgical History  Procedure Laterality Date  . Parotid tumor surgery    . Tonsillectomy and adenoidectomy    . Minithoractomy with partial lobectomy for pulmonary carcinoid  6-11    [name]    REVIEW OF SYSTEMS:  A comprehensive review of systems was negative.   PHYSICAL EXAMINATION: General appearance:  alert, cooperative and no distress Head: Normocephalic, without obvious abnormality, atraumatic Neck: no adenopathy Lymph nodes: Cervical, supraclavicular, and axillary nodes normal. Resp: clear to auscultation bilaterally Cardio: regular rate and rhythm, S1, S2 normal, no murmur, click, rub or gallop GI: soft, non-tender; bowel sounds normal; no masses,  no organomegaly Extremities: extremities normal, atraumatic, no cyanosis or edema  ECOG PERFORMANCE STATUS: 0 - Asymptomatic  Blood pressure 155/78, pulse 79, temperature 97.9 F (36.6 C), temperature source Oral, resp. rate 18, height '5\' 8"'$  (1.727 m), weight 190 lb 9.6 oz (86.456 kg), SpO2 98 %.  LABORATORY DATA: Lab Results  Component Value Date   WBC 6.5 12/11/2014   HGB 17.6* 12/11/2014   HCT 50.2* 12/11/2014   MCV 88.2 12/11/2014   PLT 203 12/11/2014      Chemistry      Component Value Date/Time   NA 139 12/11/2014 0953   NA 140 05/27/2014 0910   K 4.7 12/11/2014 0953   K 4.5 05/27/2014 0910   CL 106 05/27/2014 0910   CO2 27 12/11/2014 0953   CO2 23 05/27/2014 0910   BUN 15.7 12/11/2014 0953   BUN 11 05/27/2014 0910   CREATININE 1.2 12/11/2014 0953   CREATININE 0.96 05/27/2014 0910      Component Value Date/Time   CALCIUM 9.4 12/11/2014 0953   CALCIUM 9.3 05/27/2014 0910   ALKPHOS 63 12/11/2014 0953   ALKPHOS 66 05/27/2014 0910   AST 36* 12/11/2014 0953   AST 22 05/27/2014 0910   ALT 31 12/11/2014 0953   ALT 24 05/27/2014 0910   BILITOT 1.99* 12/11/2014 0953   BILITOT 1.0 05/27/2014 0910       RADIOGRAPHIC STUDIES: Ct Chest Wo Contrast  12/11/2014   CLINICAL DATA:  Followup lung cancer  EXAM: CT CHEST WITHOUT CONTRAST  TECHNIQUE: Multidetector CT imaging of the chest was performed following the standard protocol without IV contrast.  COMPARISON:  12/11/2013  FINDINGS: Mediastinum: The heart size is normal. There is no pericardial effusion. The trachea appears patent and is midline. Normal appearance of the  esophagus. No mediastinal or hilar adenopathy identified. There is no axillary or supraclavicular adenopathy.  Lungs/Pleura: No pleural effusion. No airspace consolidation. Postoperative changes with scarring noted in the right lower lobe. No specific findings identified to suggest residual or recurrence of tumor.  Upper Abdomen: There is no suspicious liver abnormality identified. The gallbladder appears normal. There is no gallstones identified. The visualized portions of the spleen are normal.  Musculoskeletal: No aggressive lytic or sclerotic bone lesions. Chronic right seventh rib fracture identified.  IMPRESSION: 1. No evidence for residual or recurrence of tumor status post resection of right lower lung cancer. No evidence for metastatic disease.   Electronically Signed   By: Queen Slough.D.  On: 12/11/2014 12:55   ASSESSMENT AND PLAN: This is a very pleasant 64 years old white male with history of stage IA pulmonary carcinoid tumor status post right lower lobe segmentectomy with lymph node dissection has been observation since July 2007 with no evidence for disease recurrence. The recent CT scan of the chest showed no evidence for disease recurrence. I discussed the scan results with the patient.  I gave him the option of continuous observation and annual CT scan of the chest her routine follow-up visit by his primary care physician. The patient would like some time to think about his option but I scheduled him for annual visit with repeat CT scan and he can cancel it at any time if needed.  He has elevated serum bilirubin in addition to elevated hemoglobin and hematocrit and this is our most likely secondary to his recent treatment with testosterone. I recommended for him to have close follow-up visit and routine blood monitoring by his primary care physician for this issue. He was advised to call immediately if he has any concerning symptoms in the interval.  The patient voices understanding  of current disease status and treatment options and is in agreement with the current care plan.  All questions were answered. The patient knows to call the clinic with any problems, questions or concerns. We can certainly see the patient much sooner if necessary.  Disclaimer: This note was dictated with voice recognition software. Similar sounding words can inadvertently be transcribed and may be missed upon review.

## 2014-12-25 ENCOUNTER — Encounter: Payer: Self-pay | Admitting: Family Medicine

## 2014-12-25 DIAGNOSIS — E291 Testicular hypofunction: Secondary | ICD-10-CM

## 2014-12-25 DIAGNOSIS — Z7251 High risk heterosexual behavior: Secondary | ICD-10-CM

## 2014-12-25 DIAGNOSIS — R945 Abnormal results of liver function studies: Secondary | ICD-10-CM

## 2014-12-25 DIAGNOSIS — Z20828 Contact with and (suspected) exposure to other viral communicable diseases: Secondary | ICD-10-CM

## 2014-12-25 DIAGNOSIS — R7989 Other specified abnormal findings of blood chemistry: Secondary | ICD-10-CM

## 2014-12-25 DIAGNOSIS — R718 Other abnormality of red blood cells: Secondary | ICD-10-CM

## 2014-12-25 NOTE — Telephone Encounter (Signed)
Labs requested

## 2015-01-09 ENCOUNTER — Other Ambulatory Visit: Payer: Self-pay | Admitting: Family Medicine

## 2015-01-09 NOTE — Telephone Encounter (Signed)
Yes thanks 

## 2015-01-09 NOTE — Telephone Encounter (Signed)
Refill ok? 

## 2015-01-17 ENCOUNTER — Other Ambulatory Visit (INDEPENDENT_AMBULATORY_CARE_PROVIDER_SITE_OTHER): Payer: 59

## 2015-01-17 DIAGNOSIS — R718 Other abnormality of red blood cells: Secondary | ICD-10-CM | POA: Diagnosis not present

## 2015-01-17 DIAGNOSIS — R7989 Other specified abnormal findings of blood chemistry: Secondary | ICD-10-CM

## 2015-01-17 DIAGNOSIS — E291 Testicular hypofunction: Secondary | ICD-10-CM

## 2015-01-17 DIAGNOSIS — R945 Abnormal results of liver function studies: Secondary | ICD-10-CM

## 2015-01-17 DIAGNOSIS — Z7251 High risk heterosexual behavior: Secondary | ICD-10-CM

## 2015-01-17 DIAGNOSIS — Z20828 Contact with and (suspected) exposure to other viral communicable diseases: Secondary | ICD-10-CM

## 2015-01-17 LAB — CBC
HCT: 49.5 % (ref 39.0–52.0)
Hemoglobin: 16.5 g/dL (ref 13.0–17.0)
MCHC: 33.4 g/dL (ref 30.0–36.0)
MCV: 90.7 fl (ref 78.0–100.0)
Platelets: 260 10*3/uL (ref 150.0–400.0)
RBC: 5.46 Mil/uL (ref 4.22–5.81)
RDW: 13.2 % (ref 11.5–15.5)
WBC: 8.2 10*3/uL (ref 4.0–10.5)

## 2015-01-17 LAB — COMPREHENSIVE METABOLIC PANEL
ALT: 30 U/L (ref 0–53)
AST: 25 U/L (ref 0–37)
Albumin: 4.4 g/dL (ref 3.5–5.2)
Alkaline Phosphatase: 66 U/L (ref 39–117)
BILIRUBIN TOTAL: 1.4 mg/dL — AB (ref 0.2–1.2)
BUN: 14 mg/dL (ref 6–23)
CO2: 30 meq/L (ref 19–32)
CREATININE: 1.13 mg/dL (ref 0.40–1.50)
Calcium: 9.7 mg/dL (ref 8.4–10.5)
Chloride: 102 mEq/L (ref 96–112)
GFR: 69.29 mL/min (ref >60.00)
GLUCOSE: 120 mg/dL — AB (ref 70–99)
Potassium: 4.4 mEq/L (ref 3.5–5.1)
Sodium: 140 mEq/L (ref 135–145)
Total Protein: 7.1 g/dL (ref 6.0–8.3)

## 2015-01-18 LAB — RPR

## 2015-01-18 LAB — HIV ANTIBODY (ROUTINE TESTING W REFLEX): HIV 1&2 Ab, 4th Generation: NONREACTIVE

## 2015-01-18 LAB — HEPATITIS C ANTIBODY: HCV AB: NEGATIVE

## 2015-01-21 LAB — TESTOSTERONE, FREE, TOTAL, SHBG
SEX HORMONE BINDING: 38 nmol/L (ref 22–77)
TESTOSTERONE FREE: 148.5 pg/mL (ref 47.0–244.0)
TESTOSTERONE-% FREE: 2.1 % (ref 1.6–2.9)
Testosterone: 721 ng/dL (ref 300–890)

## 2015-02-20 ENCOUNTER — Other Ambulatory Visit: Payer: Self-pay | Admitting: Family Medicine

## 2015-02-20 ENCOUNTER — Other Ambulatory Visit: Payer: Self-pay

## 2015-02-20 MED ORDER — GABAPENTIN 100 MG PO CAPS: 200.0000 mg | ORAL_CAPSULE | Freq: Every day | ORAL | Status: DC

## 2015-04-11 MED FILL — TESTOSTERONE CYP 200 MG/ML: 200 | 84 days supply | Qty: 6 | Fill #0

## 2015-04-15 ENCOUNTER — Ambulatory Visit (INDEPENDENT_AMBULATORY_CARE_PROVIDER_SITE_OTHER)
Admission: RE | Admit: 2015-04-15 | Discharge: 2015-04-15 | Disposition: A | Discharge status: Home / Self Care | Payer: 59 | Source: Ambulatory Visit | Attending: Family Medicine | Admitting: Family Medicine

## 2015-04-15 ENCOUNTER — Ambulatory Visit (INDEPENDENT_AMBULATORY_CARE_PROVIDER_SITE_OTHER): Payer: 59 | Admitting: Family Medicine

## 2015-04-15 ENCOUNTER — Encounter: Payer: Self-pay | Admitting: Family Medicine

## 2015-04-15 ENCOUNTER — Ambulatory Visit: Payer: 59 | Admitting: Family Medicine

## 2015-04-15 VITALS — BP 128/84 | HR 83 | Ht 68.0 in | Wt 190.0 lb

## 2015-04-15 DIAGNOSIS — E291 Testicular hypofunction: Secondary | ICD-10-CM | POA: Diagnosis not present

## 2015-04-15 DIAGNOSIS — M533 Sacrococcygeal disorders, not elsewhere classified: Secondary | ICD-10-CM | POA: Diagnosis not present

## 2015-04-15 DIAGNOSIS — F411 Generalized anxiety disorder: Secondary | ICD-10-CM | POA: Diagnosis not present

## 2015-04-15 DIAGNOSIS — M5442 Lumbago with sciatica, left side: Secondary | ICD-10-CM

## 2015-04-15 DIAGNOSIS — G609 Hereditary and idiopathic neuropathy, unspecified: Secondary | ICD-10-CM

## 2015-04-15 DIAGNOSIS — M5441 Lumbago with sciatica, right side: Secondary | ICD-10-CM | POA: Diagnosis not present

## 2015-04-15 DIAGNOSIS — M47816 Spondylosis without myelopathy or radiculopathy, lumbar region: Secondary | ICD-10-CM | POA: Diagnosis not present

## 2015-04-15 DIAGNOSIS — R7989 Other specified abnormal findings of blood chemistry: Secondary | ICD-10-CM

## 2015-04-15 MED ORDER — NEEDLE (DISP) 23G X 1-1/2" MISC
Status: DC
Start: 2015-04-15 — End: 2019-02-05

## 2015-04-15 MED ORDER — DICLOFENAC SODIUM 2 % TD SOLN: TRANSDERMAL | Status: DC

## 2015-04-15 MED ORDER — GABAPENTIN 300 MG PO CAPS: 300.0000 mg | ORAL_CAPSULE | Freq: Every day | ORAL | Status: DC

## 2015-04-15 MED ORDER — "SYRINGE/NEEDLE (DISP) 22G X 1-1/2"" 3 ML MISC": Status: DC

## 2015-04-15 MED ORDER — TESTOSTERONE CYPIONATE 200 MG/ML IM SOLN: 200.0000 mg | INTRAMUSCULAR | Status: DC

## 2015-04-15 MED FILL — GABAPENTIN 300 MG CAPSULE: 300 | 90 days supply | Qty: 180 | Fill #0

## 2015-04-15 NOTE — Patient Instructions (Addendum)
Good to see you I believe your testosterone is perfect. Continue the dosing you are doing.  Ice 20 minutes 2 times daily. Usually after activity and before bed.To the back Exercises when needed Gabapentin 300-'600mg'$  at night Xrays downstairs today will send info to you See me again in 4-6 weeks  Especially if anxiety is getting to you

## 2015-04-15 NOTE — Assessment & Plan Note (Signed)
Has had difficulties with this previously ddx includes lumbar spinal stenosis with peripheral neuropathy.  Xrays ordered today.  HEP given and stretching after activity Discussed shoes and will continue orthotics.

## 2015-04-15 NOTE — Assessment & Plan Note (Signed)
Continue testosterone and vit B12

## 2015-04-15 NOTE — Assessment & Plan Note (Signed)
Has had medicine before, discussed with taking care of mom then I would consider tx with more long term med that can also help the peripheral neuropathy.

## 2015-04-15 NOTE — Progress Notes (Signed)
Subjective:    CC: Peripheral neuropathy follow up low testosterone.    Patient is following up for his peripheral neuropathy. Patient was started on B12 injections and was found to have a low testosterone.  Patient increase his testosterone injections . Patient's last testosterone was in the 700s. Has been feeling very good. Patient was also doing the vitamin D supplementation, low dose gabapentin at night, and B12 supplementations. Patient states the pain in his feet seems to be a little more frequent. Seems to be more when he is doing activities. Seems to be associated with some back pain. This is a new finding. Patient states it feels more like it is going up towards his thighs from time to time. No weakness. States that in the mornings it can be more difficult as well. Does sleep on his back. No fevers chills or any abnormal weight loss. Past medical history of tumor in the lungs.  Patient was seen by oncologist. Has had recent follow-up which was unremarkable for any type of recurrence. Patient has been doing well and does not have any significant difficulty.    DHEA 577<--281 Cortisol 7.9  Past Medical History  Diagnosis Date  . Disturbance of skin sensation   . Other diseases of lung, not elsewhere classified   . Other abnormal blood chemistry   . Other testicular hypofunction   . Depressive disorder, not elsewhere classified   . Esophageal reflux   . Allergic rhinitis, cause unspecified   . Personal history of urinary calculi   . Lymphocytic colitis   . lung ca dx'd 09/2009    surg only   Past Surgical History  Procedure Laterality Date  . Parotid tumor surgery    . Tonsillectomy and adenoidectomy    . 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 Smoking status: Never Smoker   . Smokeless tobacco: Never Used  . Alcohol Use: Yes     Comment: rare occ   Allergies  Allergen Reactions  . Bee  Venom Anaphylaxis  . Flagyl [Metronidazole Hcl]   . Nsaids     REACTION: intolereance   Family History  Problem Relation Age of Onset  . Early death Neg Hx   . Heart disease Neg Hx     mother in 43s had lesoin  . Hypertension Mother     father  . Dementia Father   . Depression Father     and anxiety  . Anxiety disorder Mother      Past medical history, social, surgical and family history all reviewed and no pertinent info pertaining to chief complaint. All other was reviewed using the EMR.    Review of Systems: No fevers, chills, night sweats, weight loss, chest pain, or shortness of breath.   Objective:   Blood pressure 128/84, pulse 83, height '5\' 8"'$  (1.727 m), weight 190 lb (86.183 kg), SpO2 95 %.  General: Well Developed, well nourished, and in no acute distress.  Neuro: Alert and oriented x3, extra-ocular muscles intact, sensation grossly intact.  HEENT: Normocephalic, atraumatic, pupils equal round reactive to light, neck supple, no masses, no lymphadenopathy, thyroid nonpalpable.  Skin: Warm and dry, no rashes. Cardiac:  no lower extremity edema. Respiratory: Not using accessory muscles, speaking in full sentences. Abdominal: NT, soft Prostate exam shows the patient has no enlargement. No nodules present on rectal exam Gait: Nonantlagic, good balance and coordination Lymphatic: no lymphadenopathy in neck or  axillae on palpation, non tender.  Musculoskeletal: Inspection and palpation of the right and left upper extremities including the shoulders elbows and wrist are unremarkable with full range of motion and good muscle strength and tone. Inspection and palpation of the right and left lower extremities including the hips knees and ankles are unremarkable and nontender with full range of motion and good muscle strength and tone and are symmetric. Foot exam is nontender on exam. Patient does appear to be neurovascularly intact with 5 out of 5 strength of the ankles  bilaterally.  Back Exam:  Inspection: Unremarkable  Motion: Flexion 45 deg, Extension 25 deg, Side Bending to 35 deg bilaterally,  Rotation to 35 deg bilaterally  SLR laying: Negative  XSLR laying: Negative  Palpable tenderness: Minimal tenderness in the paraspinal musculature of the lumbar spine bilaterally. No midline tenderness FABER: negative but stiff. Sensory change: Gross sensation intact to all lumbar and sacral dermatomes.  Reflexes: 2+ at both patellar tendons, 2+ at achilles tendons, Babinski's downgoing.  Strength at foot  Plantar-flexion: 5/5 Dorsi-flexion: 5/5 Eversion: 5/5 Inversion: 5/5  Leg strength  Quad: 5/5 Hamstring: 5/5 Hip flexor: 5/5 Hip abductors: 4/5  Gait unremarkable.    Impression and Recommendations:

## 2015-04-15 NOTE — Progress Notes (Signed)
Pre visit review using our clinic review tool, if applicable. No additional management support is needed unless otherwise documented below in the visit note. 

## 2015-04-15 NOTE — Assessment & Plan Note (Signed)
Patient has a low testosterone. Patient is doing much better though on the supplementation. No significant side effects except a hemoglobin. We discussed the possibility of repeating the CBC which we declined to do today. Patient is feeling good overall. We'll call if anything changes drastically. Refill of the testosterone given today.

## 2015-05-01 MED FILL — clonazePAM 0.5 MG TABS: 0.5 | 30 days supply | Qty: 60 | Fill #3

## 2015-05-16 MED FILL — BUPROPION HCL XL 300 MG TAB: 300 | 90 days supply | Qty: 90 | Fill #3

## 2015-05-20 ENCOUNTER — Encounter: Payer: Self-pay | Admitting: Family Medicine

## 2015-05-20 ENCOUNTER — Ambulatory Visit (INDEPENDENT_AMBULATORY_CARE_PROVIDER_SITE_OTHER): Payer: 59 | Admitting: Family Medicine

## 2015-05-20 VITALS — BP 140/84 | HR 67 | Ht 68.0 in | Wt 189.0 lb

## 2015-05-20 DIAGNOSIS — E291 Testicular hypofunction: Secondary | ICD-10-CM

## 2015-05-20 DIAGNOSIS — G609 Hereditary and idiopathic neuropathy, unspecified: Secondary | ICD-10-CM | POA: Diagnosis not present

## 2015-05-20 DIAGNOSIS — R7989 Other specified abnormal findings of blood chemistry: Secondary | ICD-10-CM

## 2015-05-20 DIAGNOSIS — E279 Disorder of adrenal gland, unspecified: Secondary | ICD-10-CM

## 2015-05-20 NOTE — Assessment & Plan Note (Signed)
Seems to be making some improvement overall. We will make no significant changes. Patient has responded to many different over-the-counter medications. Patient is also noticed some improvement with decreasing dairy intake.

## 2015-05-20 NOTE — Progress Notes (Signed)
Pre visit review using our clinic review tool, if applicable. No additional management support is needed unless otherwise documented below in the visit note. 

## 2015-05-20 NOTE — Assessment & Plan Note (Signed)
Encourage patient to continue on the supplementation. We discussed possible different dosing. Patient will decide if he was given every 2 weeks or every 4 weeks.

## 2015-05-20 NOTE — Assessment & Plan Note (Signed)
Has made improvement. Continue the DHEA daily for 4 weeks and then one week off and repeat as necessary.

## 2015-05-20 NOTE — Progress Notes (Signed)
Subjective:    CC: Peripheral neuropathy follow up low testosterone.    Patient is following up for his peripheral neuropathy. Patient was started on B12 injections and was found to have a low testosterone.  Patient increase his testosterone injections  Has been feeling very good. Patient was also doing the vitamin D supplementation, low dose gabapentin at night, and B12 supplementations.  Patient was having difficulty with the numbness in his feet. Patient states that is doing better. Has decrease his dairy intake. Think that this is been helping.  Patient was also having low back pain at last exam. States that this feels better as well. Patient did have x-rays which were independently visualized by me today. 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 Mild to moderate in severity.    DHEA 577<--281 Cortisol 7.9  Past Medical History  Diagnosis Date  . Disturbance of skin sensation   . Other diseases of lung, not elsewhere classified   . Other abnormal blood chemistry   . Other testicular hypofunction   . Depressive disorder, not elsewhere classified   . Esophageal reflux   . Allergic rhinitis, cause unspecified   . Personal history of urinary calculi   . Lymphocytic colitis   . lung ca dx'd 09/2009    surg only   Past Surgical History  Procedure Laterality Date  . Parotid tumor surgery    . Tonsillectomy and adenoidectomy    . 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 Smoking status: Never Smoker   . Smokeless tobacco: Never Used  . Alcohol Use: Yes     Comment: rare occ   Allergies  Allergen Reactions  . Bee Venom Anaphylaxis  . Flagyl [Metronidazole Hcl]   . Nsaids     REACTION: intolereance   Family History  Problem Relation Age of Onset  . Early death Neg Hx   . Heart disease Neg Hx     mother in 80s had lesoin  . Hypertension  Mother     father  . Dementia Father   . Depression Father     and anxiety  . Anxiety disorder Mother      Past medical history, social, surgical and family history all reviewed and no pertinent info pertaining to chief complaint. All other was reviewed using the EMR.    Review of Systems: No fevers, chills, night sweats, weight loss, chest pain, or shortness of breath.   Objective:   Blood pressure 140/84, pulse 67, height '5\' 8"'$  (1.727 m), weight 189 lb (85.73 kg), SpO2 97 %.  General: Well Developed, well nourished, and in no acute distress.  Neuro: Alert and oriented x3, extra-ocular muscles intact, sensation grossly intact.  HEENT: Normocephalic, atraumatic, pupils equal round reactive to light, neck supple, no masses, no lymphadenopathy, thyroid nonpalpable.  Skin: Warm and dry, no rashes. Cardiac:  no lower extremity edema. Respiratory: Not using accessory muscles, speaking in full sentences. Abdominal: NT, soft Prostate exam shows the patient has no enlargement. No nodules present on rectal exam Gait: Nonantlagic, good balance and coordination Lymphatic: no lymphadenopathy in neck or axillae on palpation, non tender.  Musculoskeletal: Inspection and palpation of the right and left upper extremities including the shoulders elbows and wrist are unremarkable with full range of motion and good muscle strength and tone. Inspection and palpation of the right and left lower extremities including the hips  knees and ankles are unremarkable and nontender with full range of motion and good muscle strength and tone and are symmetric. Foot exam is nontender on exam. Patient does appear to be neurovascularly intact with 5 out of 5 strength of the ankles bilaterally. No significant numbness noted  Back Exam:  Inspection: Unremarkable  Motion: Flexion 45 deg, Extension 25 deg, Side Bending to 35 deg bilaterally,  Rotation to 35 deg bilaterally  SLR laying: Negative  XSLR laying: Negative   Palpable tenderness: Nontender on exam FABER: negative but stiff. Sensory change: Gross sensation intact to all lumbar and sacral dermatomes.  Reflexes: 2+ at both patellar tendons, 2+ at achilles tendons, Babinski's downgoing.  Strength at foot  Plantar-flexion: 5/5 Dorsi-flexion: 5/5 Eversion: 5/5 Inversion: 5/5  Leg strength  Quad: 5/5 Hamstring: 5/5 Hip flexor: 5/5 Hip abductors: 4+ /5  Gait unremarkable.    Impression and Recommendations:

## 2015-05-20 NOTE — Patient Instructions (Signed)
Good to see you  You are doing great  I like the idea on the dairy and try to limit of look at a brand called [employer] Consider changing the testosterone to '200mg'$  every 4 weeks.  DHEA daily for 4 weeks then off 1 week or daily 8 weeks ad off 2 weeks.  Happy with everything else Send me a message in 3 months.  See me again in 6 months.

## 2015-05-22 ENCOUNTER — Encounter: Payer: Self-pay | Admitting: Family Medicine

## 2015-05-23 ENCOUNTER — Other Ambulatory Visit: Payer: Self-pay

## 2015-05-23 MED ORDER — HYDROCODONE-ACETAMINOPHEN 5-325 MG PO TABS: 0.5000 | ORAL_TABLET | Freq: Two times a day (BID) | ORAL | Status: DC | PRN

## 2015-05-23 NOTE — Telephone Encounter (Signed)
May provide #30 -same as last order. I will sign- he can come in to pick up

## 2015-05-26 MED FILL — HYDROCODON-APAP 5-325: 5-325 | 35 days supply | Qty: 30 | Fill #0

## 2015-06-17 ENCOUNTER — Ambulatory Visit (INDEPENDENT_AMBULATORY_CARE_PROVIDER_SITE_OTHER): Payer: 59 | Admitting: Family Medicine

## 2015-06-17 ENCOUNTER — Encounter: Payer: Self-pay | Admitting: Family Medicine

## 2015-06-17 VITALS — BP 158/88 | HR 71 | Temp 98.6°F | Wt 193.0 lb

## 2015-06-17 DIAGNOSIS — R739 Hyperglycemia, unspecified: Secondary | ICD-10-CM

## 2015-06-17 DIAGNOSIS — Z23 Encounter for immunization: Secondary | ICD-10-CM

## 2015-06-17 DIAGNOSIS — G609 Hereditary and idiopathic neuropathy, unspecified: Secondary | ICD-10-CM | POA: Diagnosis not present

## 2015-06-17 DIAGNOSIS — Z79899 Other long term (current) drug therapy: Secondary | ICD-10-CM

## 2015-06-17 DIAGNOSIS — I10 Essential (primary) hypertension: Secondary | ICD-10-CM | POA: Diagnosis not present

## 2015-06-17 DIAGNOSIS — F411 Generalized anxiety disorder: Secondary | ICD-10-CM | POA: Diagnosis not present

## 2015-06-17 DIAGNOSIS — H6982 Other specified disorders of Eustachian tube, left ear: Secondary | ICD-10-CM

## 2015-06-17 LAB — CBC
HEMATOCRIT: 49.2 % (ref 39.0–52.0)
Hemoglobin: 16.7 g/dL (ref 13.0–17.0)
MCHC: 33.9 g/dL (ref 30.0–36.0)
MCV: 89.1 fl (ref 78.0–100.0)
PLATELETS: 234 10*3/uL (ref 150.0–400.0)
RBC: 5.53 Mil/uL (ref 4.22–5.81)
RDW: 14.3 % (ref 11.5–15.5)
WBC: 7.9 10*3/uL (ref 4.0–10.5)

## 2015-06-17 LAB — COMPREHENSIVE METABOLIC PANEL
ALT: 29 U/L (ref 0–53)
AST: 26 U/L (ref 0–37)
Albumin: 4.5 g/dL (ref 3.5–5.2)
Alkaline Phosphatase: 55 U/L (ref 39–117)
BUN: 10 mg/dL (ref 6–23)
CO2: 27 meq/L (ref 19–32)
Calcium: 9.5 mg/dL (ref 8.4–10.5)
Chloride: 104 mEq/L (ref 96–112)
Creatinine, Ser: 1.05 mg/dL (ref 0.40–1.50)
GFR: 75.32 mL/min (ref >60.00)
GLUCOSE: 124 mg/dL — AB (ref 70–99)
POTASSIUM: 4.2 meq/L (ref 3.5–5.1)
Sodium: 140 mEq/L (ref 135–145)
Total Bilirubin: 1.4 mg/dL — ABNORMAL HIGH (ref 0.2–1.2)
Total Protein: 6.7 g/dL (ref 6.0–8.3)

## 2015-06-17 LAB — PSA: PSA: 0.32 ng/mL (ref 0.10–4.00)

## 2015-06-17 LAB — HEMOGLOBIN A1C: Hgb A1c MFr Bld: 5.7 % (ref 4.6–6.5)

## 2015-06-17 MED ORDER — AMLODIPINE BESYLATE 10 MG PO TABS: 10.0000 mg | ORAL_TABLET | Freq: Every day | ORAL | Status: DC

## 2015-06-17 MED ORDER — FLUTICASONE PROPIONATE 50 MCG/ACT NA SUSP: 2.0000 | Freq: Every day | NASAL | Status: DC

## 2015-06-17 MED ORDER — CLONAZEPAM 0.5 MG PO TABS: 0.5000 mg | ORAL_TABLET | Freq: Two times a day (BID) | ORAL | Status: DC | PRN

## 2015-06-17 MED FILL — AMLODIPINE BESYLATE 10 MG T: 10 | 90 days supply | Qty: 90 | Fill #0

## 2015-06-17 MED FILL — FLUTICASONE PROP 50 MCG SPR: 50 | 30 days supply | Qty: 16 | Fill #0

## 2015-06-17 NOTE — Assessment & Plan Note (Signed)
S: higher stress with caregiver burden taking care of mom. Still about 6-8 klonopin a week.  A/P: gave handout for behavioral health- may add counseling back. Continue wellbutrin as well as klonopin for now. Wonder if addition of cymbalta could benefit this and  neuropathy

## 2015-06-17 NOTE — Assessment & Plan Note (Signed)
S: uncontrolled without medicatoin BP Readings from Last 3 Encounters:  06/17/15 170/90  05/20/15 140/84  04/15/15 128/84  A/P: start amlodipine '10mg'$ 

## 2015-06-17 NOTE — Progress Notes (Signed)
[name], MD  Subjective:  [name] Page is a 65 y.o. year old very pleasant male patient who presents for/with See problem oriented charting ROS- no chest pain. Does have left ear popping at times. No nausea/vomiting. Does have chronic pain in feet seems to be worse at night.   Past Medical History-  Patient Active Problem List   Diagnosis Date Noted  . Lung cancer ([place]) 12/11/2012    Priority: High  . Generalized anxiety disorder 03/12/2014    Priority: Medium  . Essential hypertension 03/12/2014    Priority: Medium  . Hereditary and idiopathic peripheral neuropathy 12/22/2012    Priority: Medium  . Dyspnea 07/14/2012    Priority: Medium  . Hyperlipidemia 07/14/2012    Priority: Medium  . Depression 02/16/2010    Priority: Medium  . Low testosterone 11/21/2007    Priority: Medium  . Eustachian tube dysfunction 12/17/2014    Priority: Low  . Nonspecific abnormal electrocardiogram (ECG) (EKG) 07/14/2012    Priority: Low  . Allergic reaction, history of 10/25/2011    Priority: Low  . Rectal or anal pain 10/01/2010    Priority: Low  . Allergic rhinitis 06/13/2007    Priority: Low  . GERD 06/13/2007    Priority: Low  . IBS (irritable bowel syndrome) 06/13/2007    Priority: Low  . 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 Peroneal tendonitis of right lower extremity 05/08/2014  . SI (sacroiliac) joint dysfunction 09/19/2013  . Nonallopathic lesion of sacral region 09/19/2013  . Nonallopathic lesion of thoracic region 09/19/2013  . Nonallopathic lesion of lumbosacral region 09/19/2013  . Right foot pain 12/22/2012    Medications- reviewed and updated Current Outpatient Prescriptions  Medication Sig Dispense Refill  . aspirin 81 MG tablet Take 81 mg by mouth daily.    Marland Kitchen b complex vitamins tablet Take 1 tablet by mouth daily.      Marland Kitchen buPROPion (WELLBUTRIN XL) 300 MG 24 hr tablet TAKE 1 TABLET BY MOUTH EVERY DAY 90 tablet 3  . Cholecalciferol (VITAMIN D) 2000 UNITS  CAPS Take 1 capsule by mouth daily.     . clonazePAM (KLONOPIN) 0.5 MG tablet Take 1 tablet (0.5 mg total) by mouth 2 (two) times daily as needed. 60 tablet 3  . cyanocobalamin (,VITAMIN B-12,) 1000 MCG/ML injection Inject 1 mL (1,000 mcg total) into the muscle every 30 (thirty) days. weekly 30 mL 1  . fexofenadine (ALLEGRA) 180 MG tablet Take 180 mg by mouth daily as needed.     . gabapentin (NEURONTIN) 300 MG capsule Take 1-2 capsules (300-600 mg total) by mouth at bedtime. 180 capsule 3  . Insulin Pen Needle 29G X 56.8LE MISC 1 application by Does not apply route daily. 100 each 3  . MULTIPLE VITAMIN PO Take 1 tablet by mouth daily.     Marland Kitchen NEEDLE, DISP, 23 G 23G X 1-1/2" MISC Use as directed 100 each 3  . Omega-3 Fatty Acids (FISH OIL) 1000 MG CAPS Take 1 capsule by mouth daily.    . SYRINGE-NEEDLE, DISP, 3 ML 22G X 1-1/2" 3 ML MISC Use as directed. 100 each 3  . testosterone cypionate (DEPO-TESTOSTERONE) 200 MG/ML injection Inject 1 mL (200 mg total) into the muscle every 14 (fourteen) days. 10 mL 5  . Vitamin D, Ergocalciferol, (DRISDOL) 50000 UNITS CAPS capsule Take 1 capsule (50,000 Units total) by mouth every 7 (seven) days. 12 capsule 0  . amLODipine (NORVASC) 10 MG tablet Take 1 tablet (10 mg total) by mouth daily.  90 tablet 3  . EPINEPHrine (EPIPEN 2-PAK) 0.3 mg/0.3 mL IJ SOAJ injection USE AS NEEDED (Patient not taking: Reported on 06/17/2015) 2 Device 1  . fluticasone (FLONASE) 50 MCG/ACT nasal spray Place 2 sprays into both nostrils daily. 16 g 6  . HYDROcodone-acetaminophen (NORCO/VICODIN) 5-325 MG tablet Take 0.5-1 tablets by mouth 2 (two) times daily as needed (No more than 1-2x a week use.). (Patient not taking: Reported on 06/17/2015) 30 tablet 0   No current facility-administered medications for this visit.    Objective: BP 158/88 mmHg  Pulse 71  Temp(Src) 98.6 F (37 C)  Wt 193 lb (87.544 kg) Gen: NAD, resting comfortably TM normal CV: RRR no murmurs rubs or  gallops Lungs: CTAB no crackles, wheeze, rhonchi Abdomen: soft/nontender/nondistended/normal bowel sounds.  Ext: no edema Skin: warm, dry Neuro: grossly normal, moves all extremities  Assessment/Plan:  Essential hypertension S: uncontrolled without medicatoin BP Readings from Last 3 Encounters:  06/17/15 170/90  05/20/15 140/84  04/15/15 128/84  A/P: start amlodipine '10mg'$    Hereditary and idiopathic peripheral neuropathy S: has some periods where does reasonably well on gabapentin alone. Other times needs vicodin. Still averating about once a week. Trials of Topical magnesium and diclofenac have not helped.  A/P: continue gabapentin and prn vicodin  Generalized anxiety disorder S: higher stress with caregiver burden taking care of mom. Still about 6-8 klonopin a week.  A/P: gave handout for behavioral health- may add counseling back. Continue wellbutrin as well as klonopin for now. Wonder if addition of cymbalta could benefit this and  neuropathy  Eustachian tube dysfunction S: continued issues since last year with clicking and fullness in ear at times A/P: will trial flonase at this point- only has to take if really bothering him- but would be consistent if he decides to.     No Follow-up on file. Return precautions advised.   Not fasting- Checking CMP, a1c due to hyperglycemia, CBC due to polycythemia, PSA due to testosterone therapy Orders Placed This Encounter  Procedures  . Pneumococcal conjugate vaccine 13-valent  . Comprehensive metabolic panel    [place]  . Hemoglobin A1c    [place]  . CBC    [place]  . PSA    Meds ordered this encounter  Medications  . amLODipine (NORVASC) 10 MG tablet    Sig: Take 1 tablet (10 mg total) by mouth daily.    Dispense:  90 tablet    Refill:  3  . clonazePAM (KLONOPIN) 0.5 MG tablet    Sig: Take 1 tablet (0.5 mg total) by mouth 2 (two) times daily as needed.    Dispense:  60 tablet    Refill:  3  . fluticasone (FLONASE)  50 MCG/ACT nasal spray    Sig: Place 2 sprays into both nostrils daily.    Dispense:  16 g    Refill:  6

## 2015-06-17 NOTE — Patient Instructions (Signed)
Start amlodipine. For first week, just take 1/2 pill and if tolerating increase to full pill after that week. Our goal is <140/90 for you. Technically could use 150 but want to be a little more aggressive as long as no orthostasis. You can message me to update me- if not controlled may add either lisinopril or hctz/chlorthalidone (better data)  Refilled klonopin- at # given should last 30 weeks if using 8x per week. Consider adding counseling especially with caregiver burden Also wonder if cymbalta could benefit anxiety as well as neuropathy- would be next step if klonopin does not last at least 6 months.   Received prevnar today- pneumovax in 1 year  For eustachian tube dysfunction- trial consistent flonase if really bothering you. Does not work well for just prn use  I am more than happy to see your wife- she can be seen any establish/new patient/physical slot within next 3 months if she would like to see me  Labs before you go- CMP, a1c, CBC, PSA

## 2015-06-17 NOTE — Assessment & Plan Note (Signed)
S: continued issues since last year with clicking and fullness in ear at times A/P: will trial flonase at this point- only has to take if really bothering him- but would be consistent if he decides to.

## 2015-06-17 NOTE — Assessment & Plan Note (Signed)
S: has some periods where does reasonably well on gabapentin alone. Other times needs vicodin. Still averating about once a week. Trials of Topical magnesium and diclofenac have not helped.  A/P: continue gabapentin and prn vicodin

## 2015-06-23 MED FILL — clonazePAM 0.5 MG TABS: 0.5 | 30 days supply | Qty: 60 | Fill #0

## 2015-07-30 MED FILL — FLUTICASONE PROP 50 MCG SPR: 50 | 30 days supply | Qty: 16 | Fill #1

## 2015-07-30 MED FILL — clonazePAM 0.5 MG TABS: 0.5 | 30 days supply | Qty: 60 | Fill #1

## 2015-07-30 MED FILL — GABAPENTIN 300 MG CAPSULE: 300 | 90 days supply | Qty: 180 | Fill #1

## 2015-08-02 ENCOUNTER — Telehealth: Payer: 59 | Admitting: Nurse Practitioner

## 2015-08-02 DIAGNOSIS — T148 Other injury of unspecified body region: Secondary | ICD-10-CM | POA: Diagnosis not present

## 2015-08-02 DIAGNOSIS — W57XXXA Bitten or stung by nonvenomous insect and other nonvenomous arthropods, initial encounter: Secondary | ICD-10-CM | POA: Diagnosis not present

## 2015-08-02 MED ORDER — DOXYCYCLINE HYCLATE 100 MG PO TABS: 100.0000 mg | ORAL_TABLET | Freq: Once | ORAL | Status: DC

## 2015-08-02 NOTE — Progress Notes (Signed)
Thank you for describing your tick bite, Here is how we plan to help! Based on the information that you shared with me it looks like you have A tick that bite that we will treat with a short course of doxycycline.  In most cases a tick bite is painless and does not itch.  Most tick bites in which the tick is quickly removed do not require prescriptions. Ticks can transmit several diseases if they are infected and remain attacked to your skin. Therefore the length that the tick was attached and any symptoms you have experienced after the bite are import to accurately develop your custom treatment plan. In most cases a single dose of doxycycline may prevent the development of a more serious condition.  Based on your information I have Provided a home care guide for tick bites  and  instructions on when to call for help. and I have sent a single dose of doxycycline to the pharmacy you selected. Please make sure that you selected a pharmacy that is open now.  Which ticks  are associated with illness?  The Wood Tick (dog tick) is the size of a watermelon seed and can sometimes transmit [facility] spotted fever and [place] tick fever.   The Deer Tick (black-legged tick) is between the size of a poppy seed (pin head) and an apple seed, and can sometimes transmit Lyme disease.  A brown to black tick with a white splotch on its back is likely a male Amblyomma americanum ([place] tick). This tick has been associated with [place] Tick Associated illness ( STARI)  Lyme disease has become the most common tick-borne illness in the [place]. The risk of Lyme disease following a recognized deer tick bite is estimated to be 1%.  The majority of cases of Lyme disease start with a bull's eye rash at the site of the tick bite. The rash can occur days to weeks (typically 7-10 days) after a tick bite. Treatment with antibiotics is indicated if this rash appears. Flu-like symptoms may accompany the rash,  including: fever, chills, headaches, muscle aches, and fatigue. Removing ticks promptly may prevent tick borne disease.  What can be used to prevent Tick Bites?   Insect repellant with at leas 20% DEET.  Wearing long pants with sock and shoes.  Avoiding tall grass and heavily wooded areas.  Checking your skin after being outdoors.  Shower with a washcloth after outdoor exposures.  HOME CARE ADVICE FOR TICK BITE  1. Wood Tick Removal:  o Use a pair of tweezers and grasp the wood tick close to the skin (on its head). Pull the wood tick straight upward without twisting or crushing it. Maintain a steady pressure until it releases its grip.   o If tweezers aren't available, use fingers, a loop of thread around the jaws, or a needle between the jaws for traction.  o Note: covering the tick with petroleum jelly, nail polish or rubbing alcohol doesn't work. Neither does touching the tick with a hot or cold object. 2. Tiny Deer Tick Removal:   o Needs to be scraped off with a knife blade or credit card edge. o Place tick in a sealed container (e.g. glass jar, zip lock plastic bag), in case your doctor wants to see it. 3. Tick's Head Removal:  o If the wood tick's head breaks off in the skin, it must be removed. Clean the skin. Then use a sterile needle to uncover the head and lift it out  or scrape it off.  o If a very small piece of the head remains, the skin will eventually slough it off. 4. Antibiotic Ointment:  o Wash the wound and your hands with soap and water after removal to prevent catching any tick disease.  Apply an over the counter antibiotic ointment (e.g. bacitracin) to the bite once. 5. Expected Course: Tick bites normally don't itch or hurt. That's why they often go unnoticed. 6. Call Your Doctor If:  o You can't remove the tick or the tick's head o Fever, a severe head ache, or rash occur in the next 2 weeks o Bite begins to look infected o Lyme's disease is common in your  area o You have not had a tetanus in the last 10 years o Your current symptoms become worse    MAKE SURE YOU   Understand these instructions.  Will watch your condition.  Will get help right away if you are not doing well or get worse.   Thank you for choosing an e-visit.  Your e-visit answers were reviewed by a board certified advanced clinical practitioner to complete your personal care plan. Depending upon the condition, your plan could have included both over the counter or prescription medications. Please review your pharmacy choice. If there is a problem you may use MyChart messaging to have the prescription routed to another pharmacy. Your safety is important to [place]. If you have drug allergies check your prescription carefully.   You can use MyChart to ask questions about today's visit, request a non-urgent call back, or ask for a work or school excuse for 24 hours related to this e-Visit. If it has been greater than 24 hours you will need to follow up with your provider, or enter a new e-Visit to address those concerns.  You will get an email in the next two days asking about your experience. I hope  that your e-visit has been valuable and will speed your recovery

## 2015-08-07 MED FILL — TESTOSTERONE CYP 200 MG/ML: 200 | 84 days supply | Qty: 6 | Fill #0

## 2015-08-13 ENCOUNTER — Other Ambulatory Visit: Payer: Self-pay | Admitting: Family Medicine

## 2015-08-13 MED FILL — BUPROPION HCL XL 300 MG TAB: 300 | 90 days supply | Qty: 90 | Fill #0

## 2015-08-13 MED FILL — CYANOCOBALAMIN 1,000 MCG/ML: 1000 | 90 days supply | Qty: 3 | Fill #2

## 2015-08-27 ENCOUNTER — Other Ambulatory Visit: Payer: Self-pay

## 2015-08-27 ENCOUNTER — Encounter: Payer: Self-pay | Admitting: Family Medicine

## 2015-08-27 MED ORDER — EPINEPHRINE 0.3 MG/0.3ML IJ SOAJ: INTRAMUSCULAR | Status: DC

## 2015-08-27 MED ORDER — HYDROCODONE-ACETAMINOPHEN 5-325 MG PO TABS: 0.5000 | ORAL_TABLET | Freq: Two times a day (BID) | ORAL | Status: DC | PRN

## 2015-08-27 MED FILL — EPINEPHRINE 0.3 MG AUTO-INJ: 0.3 | 30 days supply | Qty: 2 | Fill #0

## 2015-08-27 NOTE — Telephone Encounter (Signed)
May fill but inform patient next refill needs visit. He uses sparingly- remind him 2x a week maximum.

## 2015-08-28 ENCOUNTER — Other Ambulatory Visit: Payer: Self-pay | Admitting: Pediatrics

## 2015-08-28 MED ORDER — HYDROCODONE-ACETAMINOPHEN 5-325 MG PO TABS: 0.5000 | ORAL_TABLET | Freq: Two times a day (BID) | ORAL | Status: DC | PRN

## 2015-08-28 NOTE — Telephone Encounter (Signed)
This rx was reprinted.  The rx was lost in office.

## 2015-09-01 MED FILL — clonazePAM 0.5 MG TABS: 0.5 | 30 days supply | Qty: 60 | Fill #2

## 2015-09-03 MED FILL — HYDROCODON-APAP 5-325: 5-325 | 52 days supply | Qty: 30 | Fill #0

## 2015-09-15 MED FILL — AMLODIPINE BESYLATE 10 MG T: 10 | 90 days supply | Qty: 90 | Fill #1

## 2015-10-15 ENCOUNTER — Other Ambulatory Visit: Payer: Self-pay | Admitting: Medical Oncology

## 2015-10-15 ENCOUNTER — Encounter: Payer: Self-pay | Admitting: Internal Medicine

## 2015-10-30 MED FILL — GABAPENTIN 300 MG CAPSULE: 300 | 90 days supply | Qty: 180 | Fill #2

## 2015-10-30 MED FILL — clonazePAM 0.5 MG TABS: 0.5 | 30 days supply | Qty: 60 | Fill #3

## 2015-11-10 MED FILL — BUPROPION HCL XL 300 MG TAB: 300 | 90 days supply | Qty: 90 | Fill #1

## 2015-11-16 NOTE — Progress Notes (Signed)
Subjective:    CC: Peripheral neuropathy follow up low testosterone.    Patient is following up for his peripheral neuropathy. Patient was started on B12 injections and was found to have a low testosterone.  Patient increase his testosterone injections  Was feeling significantly better. This 6 months ago. Patient states that he is having difficult he with concentration when being on the gabapentin and started decreasing. Unfortunately has decreasing as he is noticing a little worsening upper full neuropathy as well as ankle pain. Continues to wear the orthotics fairly regularly.  Low back pain seems to be all right.  Patient did have a recent loss to his mother and since then has not been doing the exercises as regularly. Has noticed some mild stiffness in the back.  DHEA 577<--281 Cortisol 7.9 Lab Results  Component Value Date   TESTOSTERONE 721 01/17/2015     Past Medical History:  Diagnosis Date  . Allergic rhinitis, cause unspecified   . Depressive disorder, not elsewhere classified   . Disturbance of skin sensation   . Esophageal reflux   . lung ca dx'd 09/2009   surg only  . Lymphocytic colitis   . Other abnormal blood chemistry   . Other diseases of lung, not elsewhere classified   . Other testicular hypofunction   . Personal history of urinary calculi    Past Surgical History:  Procedure Laterality Date  . 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 parotid tumor surgery    . TONSILLECTOMY AND ADENOIDECTOMY     Social History  Substance Use Topics  . Smoking status: Never Smoker  . Smokeless tobacco: Never Used  . Alcohol use Yes     Comment: rare occ   Allergies  Allergen Reactions  . Bee Venom Anaphylaxis  . Flagyl [Metronidazole Hcl]   . Nsaids     REACTION: intolereance   Family History  Problem Relation Age of Onset  . Early death Neg Hx   . Heart disease Neg Hx     mother in 21s had lesoin  .  Hypertension Mother     father  . Dementia Father   . Depression Father     and anxiety  . Anxiety disorder Mother      Past medical history, social, surgical and family history all reviewed and no pertinent info pertaining to chief complaint. All other was reviewed using the EMR.    Review of Systems: No fevers, chills, night sweats, weight loss, chest pain, or shortness of breath.   Objective:   Blood pressure 132/78, pulse 73, weight 186 lb (84.4 kg), SpO2 97 %.  General: Well Developed, well nourished, and in no acute distress.  Neuro: Alert and oriented x3, extra-ocular muscles intact, sensation grossly intact.  HEENT: Normocephalic, atraumatic, pupils equal round reactive to light, neck supple, no masses, no lymphadenopathy, thyroid nonpalpable.  Skin: Warm and dry, no rashes. Cardiac:  no lower extremity edema. Respiratory: Not using accessory muscles, speaking in full sentences. Abdominal: NT, soft Prostate exam shows the patient has no enlargement. No nodules present on rectal exam Gait: Nonantlagic, good balance and coordination Lymphatic: no lymphadenopathy in neck or axillae on palpation, non tender.  Musculoskeletal: Inspection and palpation of the right and left upper extremities including the shoulders elbows and wrist are unremarkable with full range of motion and good muscle strength and tone. Inspection and palpation of the right and left lower extremities including the hips knees and ankles are  unremarkable and nontender with full range of motion and good muscle strength and tone and are symmetric. Foot exam is nontender on exam. Patient does appear to be neurovascularly intact with 5 out of 5 strength of the ankles bilaterally. No significant numbness noted  Back Exam:  Inspection: Unremarkable  Motion: Flexion 45 deg, Extension 25 deg, Side Bending to 35 deg bilaterally,  Rotation to 35 deg bilaterally  SLR laying: Negative  XSLR laying: Negative  Palpable  tenderness: Nontender on exam FABER: negative for continued tightness Sensory change: Gross sensation intact to all lumbar and sacral dermatomes.  Reflexes: 2+ at both patellar tendons, 2+ at achilles tendons, Babinski's downgoing.  Strength at foot  Plantar-flexion: 5/5 Dorsi-flexion: 5/5 Eversion: 5/5 Inversion: 5/5  Leg strength  Quad: 5/5 Hamstring: 5/5 Hip flexor: 5/5 Hip abductors: 4+ /5  Gait unremarkable.    Impression and Recommendations:

## 2015-11-17 ENCOUNTER — Encounter: Payer: Self-pay | Admitting: Family Medicine

## 2015-11-17 ENCOUNTER — Ambulatory Visit (INDEPENDENT_AMBULATORY_CARE_PROVIDER_SITE_OTHER): Payer: 59 | Admitting: Family Medicine

## 2015-11-17 DIAGNOSIS — F411 Generalized anxiety disorder: Secondary | ICD-10-CM

## 2015-11-17 DIAGNOSIS — M79671 Pain in right foot: Secondary | ICD-10-CM | POA: Diagnosis not present

## 2015-11-17 DIAGNOSIS — E291 Testicular hypofunction: Secondary | ICD-10-CM

## 2015-11-17 DIAGNOSIS — R7989 Other specified abnormal findings of blood chemistry: Secondary | ICD-10-CM

## 2015-11-17 MED ORDER — VENLAFAXINE HCL ER 37.5 MG PO CP24: 37.5000 mg | ORAL_CAPSULE | Freq: Every day | ORAL | 1 refills | Status: DC

## 2015-11-17 MED FILL — VENLAFAXINE HCL ER 37.5 MG: 37.5 | 30 days supply | Qty: 30 | Fill #0

## 2015-11-17 NOTE — Assessment & Plan Note (Signed)
Discussed with patient to continue to wear the orthotics. We discussed over-the-counter orthotics. Patient come back and see me in 4 weeks. If worsening symptoms we'll try to make some adjustments.

## 2015-11-17 NOTE — Patient Instructions (Addendum)
Good to see you  I am sorry about your mom.  Lets stop the gabapentin  Effexor 37.5 mg daily  Have Dr. Yong Channel also check your testosterone, CMET and PSA with your yearly.  See me again in 4 weeks.

## 2015-11-17 NOTE — Assessment & Plan Note (Signed)
Discussed with patient again. Patient is due to have his testosterone check again but will be having his annual exam in the near future. Patient will get the lab draw including PSA as well as complete metabolic panel at that visit. Depending on the results we'll decide if any other changes as necessary.

## 2015-11-17 NOTE — Assessment & Plan Note (Signed)
Patient does have some history generalized anxiety disorder. Patient given a prescription for Effexor that can help him with this as well as some of the peripheral neuropathy. Patient will check with primary care provider as well. We discussed potential side effects. I do think will be beneficial in the long run. Patient's will come back and see me again in 4 weeks. If we do go up to 75 mg and would like to decrease his Wellbutrin.

## 2015-11-20 ENCOUNTER — Encounter: Payer: Self-pay | Admitting: Family Medicine

## 2015-11-21 ENCOUNTER — Ambulatory Visit (HOSPITAL_COMMUNITY): Admission: RE | Admit: 2015-11-21 | Payer: 59 | Source: Ambulatory Visit

## 2015-11-21 ENCOUNTER — Encounter: Payer: Self-pay | Admitting: Family Medicine

## 2015-11-21 ENCOUNTER — Other Ambulatory Visit: Payer: Self-pay

## 2015-11-21 ENCOUNTER — Ambulatory Visit (INDEPENDENT_AMBULATORY_CARE_PROVIDER_SITE_OTHER): Payer: 59 | Admitting: Family Medicine

## 2015-11-21 VITALS — BP 140/90 | HR 85 | Temp 97.7°F | Ht 68.0 in | Wt 183.1 lb

## 2015-11-21 DIAGNOSIS — M79605 Pain in left leg: Secondary | ICD-10-CM

## 2015-11-21 LAB — D-DIMER, QUANTITATIVE (NOT AT ARMC): D DIMER QUANT: 0.4 ug{FEU}/mL (ref <0.50)

## 2015-11-21 NOTE — Patient Instructions (Signed)
Thanks for teaching me something new!  Stat d-dimer and should have back before end of day to determine need for ultrasound. Hopefully we have enough time that I could get you some xarelto if positive and we could get scan completed later

## 2015-11-21 NOTE — Progress Notes (Signed)
Subjective:  [name] Page is a 65 y.o. year old very pleasant male patient who presents for/with See problem oriented charting ROS- no chest pain, shortness of breath, leg edema, recent immobilization. 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 see any ROS included in HPI as well.   Past Medical History-  Patient Active Problem List   Diagnosis Date Noted  . Lung cancer ([place]) 12/11/2012    Priority: High  . Generalized anxiety disorder 03/12/2014    Priority: Medium  . Essential hypertension 03/12/2014    Priority: Medium  . Hereditary and idiopathic peripheral neuropathy 12/22/2012    Priority: Medium  . Dyspnea 07/14/2012    Priority: Medium  . Hyperlipidemia 07/14/2012    Priority: Medium  . Depression 02/16/2010    Priority: Medium  . Low testosterone 11/21/2007    Priority: Medium  . Eustachian tube dysfunction 12/17/2014    Priority: Low  . Nonspecific abnormal electrocardiogram (ECG) (EKG) 07/14/2012    Priority: Low  . Allergic reaction, history of 10/25/2011    Priority: Low  . Rectal or anal pain 10/01/2010    Priority: Low  . Allergic rhinitis 06/13/2007    Priority: Low  . GERD 06/13/2007    Priority: Low  . IBS (irritable bowel syndrome) 06/13/2007    Priority: Low  . 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 Peroneal tendonitis of right lower extremity 05/08/2014  . SI (sacroiliac) joint dysfunction 09/19/2013  . Nonallopathic lesion of sacral region 09/19/2013  . Nonallopathic lesion of thoracic region 09/19/2013  . Nonallopathic lesion of lumbosacral region 09/19/2013  . Right foot pain 12/22/2012    Medications- reviewed and updated Current Outpatient Prescriptions  Medication Sig Dispense Refill  . amLODipine (NORVASC) 5 MG tablet Take 5 mg by mouth daily.    Marland Kitchen aspirin 81 MG tablet Take 81 mg by mouth daily.    Marland Kitchen b complex vitamins tablet Take 1 tablet by mouth daily.      Marland Kitchen buPROPion (WELLBUTRIN XL) 300 MG 24 hr tablet TAKE 1  TABLET BY MOUTH EVERY DAY 90 tablet 3  . Cholecalciferol (VITAMIN D) 2000 UNITS CAPS Take 1 capsule by mouth daily.     . clonazePAM (KLONOPIN) 0.5 MG tablet Take 1 tablet (0.5 mg total) by mouth 2 (two) times daily as needed. 60 tablet 3  . Coenzyme Q10 (CO Q 10 PO) Take 1 capsule by mouth daily.    . cyanocobalamin (,VITAMIN B-12,) 1000 MCG/ML injection Inject 1 mL (1,000 mcg total) into the muscle every 30 (thirty) days. weekly 30 mL 1  . EPINEPHrine (EPIPEN 2-PAK) 0.3 mg/0.3 mL IJ SOAJ injection USE AS NEEDED 2 Device 1  . fexofenadine (ALLEGRA) 180 MG tablet Take 180 mg by mouth daily as needed.     . fluticasone (FLONASE) 50 MCG/ACT nasal spray Place 2 sprays into both nostrils daily. 16 g 6  . gabapentin (NEURONTIN) 300 MG capsule Take 1-2 capsules (300-600 mg total) by mouth at bedtime. 180 capsule 3  . HYDROcodone-acetaminophen (NORCO/VICODIN) 5-325 MG tablet Take 0.5-1 tablets by mouth 2 (two) times daily as needed (No more than 1-2x a week use.). 30 tablet 0  . Insulin Pen Needle 29G X 11.5BW MISC 1 application by Does not apply route daily. 100 each 3  . MAGNESIUM PO Take 1 capsule by mouth daily.    Marland Kitchen NEEDLE, DISP, 23 G 23G X 1-1/2" MISC Use as directed 100 each 3  . Omega-3 Fatty Acids (FISH OIL) 1000 MG  CAPS Take 1 capsule by mouth 2 (two) times daily.     . Saw Palmetto, Serenoa repens, (SAW PALMETTO PO) Take 1 capsule by mouth 2 (two) times daily.    . SYRINGE-NEEDLE, DISP, 3 ML 22G X 1-1/2" 3 ML MISC Use as directed. 100 each 3  . testosterone cypionate (DEPO-TESTOSTERONE) 200 MG/ML injection Inject 1 mL (200 mg total) into the muscle every 14 (fourteen) days. 10 mL 5  . venlafaxine XR (EFFEXOR XR) 37.5 MG 24 hr capsule Take 1 capsule (37.5 mg total) by mouth daily with breakfast. 30 capsule 1   No current facility-administered medications for this visit.     Objective: BP 140/90   Pulse 85   Temp 97.7 F (36.5 C) (Oral)   Ht '5\' 8"'$  (1.727 m)   Wt 183 lb 2 oz (83.1 kg)    SpO2 98%   BMI 27.84 kg/m  Gen: NAD, resting comfortably CV: RRR no murmurs rubs or gallops Lungs: CTAB no crackles, wheeze, rhonchi Abdomen: soft/nontender/nondistended/normal bowel sounds. No rebound or guarding.  Ext: no edema MSK: pinpoint area of tenderness deep in medial hamstring Skin: warm, dry Neuro: grossly normal, moves all extremities  Assessment/Plan:  Left leg pain - Plan: D-dimer, quantitative (not at [facility]) S: Patient has noted for at least 2-3 days an area in his left inner aspect of upper leg/hamstring a dull achy pain with some mild radiation locally. Worse with walking and moving around. Better with rest. He denied redness, warmth, swelling. Patient of ntoe is on testosterone. He works all weekend and out of town next Monday and Tuesday. Describes pain 3/10 ache and 6/10 if pushes on. States unlike other pains he has had previously from muscular pain which concerned him though he has been active around the house.  A/P: My plan originally was for duplex- ordered initially then cancelled after talking to patient. He had reviewed upto date algorithm which presents option of d- dimer in low risk patients. His modified wells score was 1  So he is low risk. D- dimer ordered as alternate. Ordered stat. Patient given #14 xarelto in case d-dimer is positive and we cannot arrange close follow up duplex (such as needing to get this next Wednesday) though he is to hold off taking for now.    We discussed adjusting BP meds as well which he declines for now but wants to research ARBs. Worried about SE profile on ace-I.   Orders Placed This Encounter  Procedures  . D-dimer, quantitative (not at [facility])   Return precautions advised.  [name], MD

## 2015-11-21 NOTE — Progress Notes (Signed)
Pre visit review using our clinic review tool, if applicable. No additional management support is needed unless otherwise documented below in the visit note. 

## 2015-11-24 ENCOUNTER — Telehealth: Payer: Self-pay | Admitting: *Deleted

## 2015-11-24 NOTE — Telephone Encounter (Signed)
Patient called with chest pain and tightness. Dr. Etter Sjogren was paged and spoke with patient. Patient wanted a chest CT ordered. Dr. Lenon Ahmadi she would not order without seeing patient. Patient was instructed to go to the ED right away. Patient did not comply.  Phoned patient this AM and he was in [facility]. Stated pain was mild and felt more pleural than heart related. Stated he wanted to get CT done before went out of town but it didn't work out. Instructed patient to go to ED if any more episodes and to keep [place] posted on his condition.         PLEASE NOTE: All timestamps contained within this report are represented as [place] Standard Time. CONFIDENTIALTY NOTICE: This fax transmission is intended only for the addressee. It contains information that is legally privileged, confidential or otherwise protected from use or disclosure. If you are not the intended recipient, you are strictly prohibited from reviewing, disclosing, copying using or disseminating any of this information or taking any action in reliance on or regarding this information. If you have received this fax in error, please notify us immediately by telephone so that we can arrange for its return to [place]. Phone: [phone], Toll-Free: [phone], Fax: [phone] Page: 1 of 2 Call Id: [id number] [facility] - Client [place] Patient Name: [name] Page Gender: Male DOB: 02-13-1951 Age: 65 Y 16 M 7 D Return Phone Number: [phone] (Secondary) Address: City/State/Zip: [place] Client [facility] - Client Client Site [place] - Night Physician [name] - MD Contact Type Call Who Is Calling Patient / Member / Family / Caregiver Call Type Triage / Clinical Relationship To Patient Self Return Phone Number [phone] (Secondary) Chief Complaint CHEST PAIN (>=21 years) - pain,  pressure, heaviness or tightness Reason for Call Symptomatic / Request for [place] states having chest discomfort, mild cough Translation No Nurse Assessment Nurse: Leilani Merl, RN, Heather Date/Time (Eastern Time): 11/23/2015 9:06:46 AM Confirm and document reason for call. If symptomatic, describe symptoms. You must click the next button to save text entered. ---Caller states having mild chest discomfort, mild cough. This started last week. Has the patient traveled out of the country within the last 30 days? ---Not Applicable Does the patient have any new or worsening symptoms? ---Yes Will a triage be completed? ---Yes Related visit to physician within the last 2 weeks? ---No Does the PT have any chronic conditions? (i.e. diabetes, asthma, etc.) ---Yes List chronic conditions. ---previous cancer, on testosterone Is this a behavioral health or substance abuse call? ---No Guidelines Guideline Title Affirmed Question Affirmed Notes Nurse Date/Time (Eastern Time) Disp. Time Eilene Ghazi Time) Disposition Final User 11/23/2015 9:03:48 AM Send to Urgent Queue Jearld Shines 11/23/2015 9:16:12 AM Paged On Call back to [facility], RN, Nira Conn 11/23/2015 9:21:44 AM Clinical Call Yes Standifer, RN, Nira Conn PLEASE NOTE: All timestamps contained within this report are represented as [place] Standard Time. CONFIDENTIALTY NOTICE: This fax transmission is intended only for the addressee. It contains information that is legally privileged, confidential or otherwise protected from use or disclosure. If you are not the intended recipient, you are strictly prohibited from reviewing, disclosing, copying using or disseminating any of this information or taking any action in reliance on or regarding this information. If you have received this fax in error, please notify us immediately by telephone so that we can arrange for its return to [place]. Phone: [phone],  Toll-Free: [phone], Fax: [phone] Page: 2 of 2 Call Id: [id number] Comments User: Ave Filter, RN Date/Time Eilene Ghazi Time): 11/23/2015 9:16:06 AM Caller states that he is at work and just wants a CT of chest with contrast ordered, he wants to be checked for a PE and previous cancer tumor. He wants the on call physician paged. User: Ave Filter, RN Date/Time Eilene Ghazi Time): 11/23/2015 9:21:39 AM Dr. Etter Sjogren returned page and information given. Dr. Etter Sjogren states that she will not order a CT without seeing the patient, he needs to go to the ED. Called caller back and information given. Caller verbalizes understanding. Paging DoctorName Phone DateTime Result/Outcome Message Type Notes Garnet Koyanagi - MD [phone] 11/23/2015 9:16:12 AM Paged On Call Back to Call Center Doctor Paged Nurse [place], [phone] Garnet Koyanagi - MD 11/23/2015 9:22:06 AM Spoke with On Call - General Message Result

## 2015-11-26 ENCOUNTER — Encounter: Payer: Self-pay | Admitting: Family Medicine

## 2015-11-26 DIAGNOSIS — M79605 Pain in left leg: Secondary | ICD-10-CM

## 2015-11-28 ENCOUNTER — Ambulatory Visit (HOSPITAL_COMMUNITY)
Admission: RE | Admit: 2015-11-28 | Discharge: 2015-11-28 | Disposition: A | Discharge status: Home / Self Care | Payer: 59 | Source: Ambulatory Visit | Attending: Family Medicine | Admitting: Family Medicine

## 2015-11-28 ENCOUNTER — Telehealth: Payer: Self-pay

## 2015-11-28 ENCOUNTER — Telehealth: Payer: Self-pay | Admitting: *Deleted

## 2015-11-28 DIAGNOSIS — M79605 Pain in left leg: Secondary | ICD-10-CM

## 2015-11-28 NOTE — Telephone Encounter (Signed)
Patient specifically requested this. 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

## 2015-11-28 NOTE — Telephone Encounter (Signed)
That's great news. Thanks. Will await final report.

## 2015-11-28 NOTE — Telephone Encounter (Signed)
Jeri called from [facility] stating the pt is scheduled for doppler of the lower extremity today at 4pm.  [place] tech questioned if the pt still needs this test since the D-dimer was negative?  Please call Jeri at [phone].

## 2015-11-28 NOTE — Telephone Encounter (Signed)
[name] Page from [facility] called and Left lower venous study is negative for DVT.

## 2015-11-28 NOTE — Progress Notes (Signed)
**  Preliminary report by tech**  Left lower extremity venous duplex completed. There is no evidence of deep or superficial vein thrombosis involving the left lower extremity. All visualized vessels appear patent and compressible. There is no evidence of a Baker's cyst on the left.   11/28/15 4:05 PM [name] Page RVT

## 2015-12-10 ENCOUNTER — Ambulatory Visit (INDEPENDENT_AMBULATORY_CARE_PROVIDER_SITE_OTHER): Payer: 59 | Admitting: Family Medicine

## 2015-12-10 ENCOUNTER — Other Ambulatory Visit: Payer: Self-pay | Admitting: Family Medicine

## 2015-12-10 ENCOUNTER — Encounter: Payer: Self-pay | Admitting: Family Medicine

## 2015-12-10 VITALS — BP 148/86 | HR 72 | Temp 98.1°F | Ht 67.25 in | Wt 186.2 lb

## 2015-12-10 DIAGNOSIS — E279 Disorder of adrenal gland, unspecified: Secondary | ICD-10-CM | POA: Diagnosis not present

## 2015-12-10 DIAGNOSIS — R7989 Other specified abnormal findings of blood chemistry: Secondary | ICD-10-CM | POA: Diagnosis not present

## 2015-12-10 DIAGNOSIS — Z0000 Encounter for general adult medical examination without abnormal findings: Secondary | ICD-10-CM | POA: Diagnosis not present

## 2015-12-10 DIAGNOSIS — E291 Testicular hypofunction: Secondary | ICD-10-CM

## 2015-12-10 DIAGNOSIS — C3411 Malignant neoplasm of upper lobe, right bronchus or lung: Secondary | ICD-10-CM

## 2015-12-10 DIAGNOSIS — Z1283 Encounter for screening for malignant neoplasm of skin: Secondary | ICD-10-CM

## 2015-12-10 DIAGNOSIS — F329 Major depressive disorder, single episode, unspecified: Secondary | ICD-10-CM

## 2015-12-10 DIAGNOSIS — F32A Depression, unspecified: Secondary | ICD-10-CM

## 2015-12-10 DIAGNOSIS — I10 Essential (primary) hypertension: Secondary | ICD-10-CM

## 2015-12-10 LAB — LIPID PANEL
CHOL/HDL RATIO: 3
Cholesterol: 173 mg/dL (ref 0–200)
HDL: 57.8 mg/dL (ref >39.00)
LDL Cholesterol: 98 mg/dL (ref 0–99)
NonHDL: 114.93
TRIGLYCERIDES: 87 mg/dL (ref 0.0–149.0)
VLDL: 17.4 mg/dL (ref 0.0–40.0)

## 2015-12-10 LAB — COMPREHENSIVE METABOLIC PANEL
ALBUMIN: 4.5 g/dL (ref 3.5–5.2)
ALT: 20 U/L (ref 0–53)
AST: 16 U/L (ref 0–37)
Alkaline Phosphatase: 67 U/L (ref 39–117)
BILIRUBIN TOTAL: 1.3 mg/dL — AB (ref 0.2–1.2)
BUN: 9 mg/dL (ref 6–23)
CALCIUM: 9.3 mg/dL (ref 8.4–10.5)
CO2: 30 mEq/L (ref 19–32)
CREATININE: 1.01 mg/dL (ref 0.40–1.50)
Chloride: 102 mEq/L (ref 96–112)
GFR: 78.66 mL/min (ref >60.00)
Glucose, Bld: 115 mg/dL — ABNORMAL HIGH (ref 70–99)
Potassium: 4.6 mEq/L (ref 3.5–5.1)
Sodium: 138 mEq/L (ref 135–145)
TOTAL PROTEIN: 6.8 g/dL (ref 6.0–8.3)

## 2015-12-10 LAB — CBC
HCT: 47.5 % (ref 39.0–52.0)
Hemoglobin: 16.3 g/dL (ref 13.0–17.0)
MCHC: 34.3 g/dL (ref 30.0–36.0)
MCV: 94.2 fl (ref 78.0–100.0)
PLATELETS: 232 10*3/uL (ref 150.0–400.0)
RBC: 5.04 Mil/uL (ref 4.22–5.81)
RDW: 12.9 % (ref 11.5–15.5)
WBC: 7.1 10*3/uL (ref 4.0–10.5)

## 2015-12-10 MED ORDER — AMLODIPINE BESYLATE 5 MG PO TABS: 5.0000 mg | ORAL_TABLET | Freq: Every day | ORAL | 3 refills | Status: DC

## 2015-12-10 MED ORDER — HYDROCODONE-ACETAMINOPHEN 5-325 MG PO TABS: 0.5000 | ORAL_TABLET | Freq: Two times a day (BID) | ORAL | 0 refills | Status: DC | PRN

## 2015-12-10 MED ORDER — CLONAZEPAM 0.5 MG PO TABS: 0.5000 mg | ORAL_TABLET | Freq: Two times a day (BID) | ORAL | 3 refills | Status: DC | PRN

## 2015-12-10 MED FILL — AMLODIPINE BESYLATE 5 MG TA: 5 | 90 days supply | Qty: 90 | Fill #0

## 2015-12-10 NOTE — Addendum Note (Signed)
Addended by: Gari Crown D on: 12/10/2015 08:56 AM   Modules accepted: Orders

## 2015-12-10 NOTE — Assessment & Plan Note (Signed)
continues on wellbutrin '300mg'$ . effexor could not sleep well at all for 2 days. Would like to see psychiatry or counselor (given info for both- like to try [employer] first and then our counselors if not good fit)- continues to be down about loss of mother this year- major stressor with him and family. No SI/HI.

## 2015-12-10 NOTE — Patient Instructions (Addendum)
Mychart me when you get the flu shot  We will call you within a week about your referral to dermatology. If you do not hear within 2 weeks, give [place] a call.   For lab- please also release DHEA and testosterone ordered by Dr. Tamala Julian

## 2015-12-10 NOTE — Assessment & Plan Note (Signed)
controlled on amlodipine '5mg'$  generally- 10% of readings above 140 at home. Mild elevation today- still at goal per [facility] and age

## 2015-12-10 NOTE — Progress Notes (Signed)
Phone: [phone]  Subjective:  Patient presents today for their annual physical. Chief complaint-noted.   See problem oriented charting- ROS- full  review of systems was completed and negative except for: mild left leg pain- see prior notes- improving  The following were reviewed and entered/updated in epic: Past Medical History:  Diagnosis Date  . Allergic rhinitis, cause unspecified   . Depressive disorder, not elsewhere classified   . Disturbance of skin sensation   . Esophageal reflux   . lung ca dx'd 09/2009   surg only  . Lymphocytic colitis   . Other abnormal blood chemistry   . Other diseases of lung, not elsewhere classified   . Other testicular hypofunction   . Personal history of urinary calculi    Patient Active Problem List   Diagnosis Date Noted  . Lung cancer ([place]) 12/11/2012    Priority: High  . Generalized anxiety disorder 03/12/2014    Priority: Medium  . Essential hypertension 03/12/2014    Priority: Medium  . Hereditary and idiopathic peripheral neuropathy 12/22/2012    Priority: Medium  . Dyspnea 07/14/2012    Priority: Medium  . Hyperlipidemia 07/14/2012    Priority: Medium  . Depression 02/16/2010    Priority: Medium  . Low testosterone 11/21/2007    Priority: Medium  . Eustachian tube dysfunction 12/17/2014    Priority: Low  . Nonspecific abnormal electrocardiogram (ECG) (EKG) 07/14/2012    Priority: Low  . Allergic reaction, history of 10/25/2011    Priority: Low  . Rectal or anal pain 10/01/2010    Priority: Low  . Allergic rhinitis 06/13/2007    Priority: Low  . GERD 06/13/2007    Priority: Low  . IBS (irritable bowel syndrome) 06/13/2007    Priority: Low  . 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 Peroneal tendonitis of right lower extremity 05/08/2014  . SI (sacroiliac) joint dysfunction 09/19/2013  . Nonallopathic lesion of sacral region 09/19/2013  . Nonallopathic lesion of thoracic region 09/19/2013  . Nonallopathic lesion  of lumbosacral region 09/19/2013  . Right foot pain 12/22/2012   Past Surgical History:  Procedure Laterality Date  . 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 parotid tumor surgery    . TONSILLECTOMY AND ADENOIDECTOMY      Family History  Problem Relation Age of Onset  . Hypertension Mother   . Anxiety disorder Mother   . Non-Hodgkin's lymphoma Mother   . Dementia Father   . Depression Father     and anxiety  . Hypertension Father   . Early death Neg Hx   . Heart disease Neg Hx     mother in 66s had lesoin    Medications- reviewed and updated Current Outpatient Prescriptions  Medication Sig Dispense Refill  . amLODipine (NORVASC) 5 MG tablet Take 1 tablet (5 mg total) by mouth daily. 90 tablet 3  . aspirin 81 MG tablet Take 81 mg by mouth daily.    Marland Kitchen b complex vitamins tablet Take 1 tablet by mouth daily.      Marland Kitchen buPROPion (WELLBUTRIN XL) 300 MG 24 hr tablet TAKE 1 TABLET BY MOUTH EVERY DAY 90 tablet 3  . Cholecalciferol (VITAMIN D) 2000 UNITS CAPS Take 1 capsule by mouth daily.     . clonazePAM (KLONOPIN) 0.5 MG tablet Take 1 tablet (0.5 mg total) by mouth 2 (two) times daily as needed. 60 tablet 3  . Coenzyme Q10 (CO Q 10 PO) Take 1 capsule by mouth daily.    Marland Kitchen  cyanocobalamin (,VITAMIN B-12,) 1000 MCG/ML injection Inject 1 mL (1,000 mcg total) into the muscle every 30 (thirty) days. weekly 30 mL 1  . EPINEPHrine (EPIPEN 2-PAK) 0.3 mg/0.3 mL IJ SOAJ injection USE AS NEEDED 2 Device 1  . fexofenadine (ALLEGRA) 180 MG tablet Take 180 mg by mouth daily as needed.     . fluticasone (FLONASE) 50 MCG/ACT nasal spray Place 2 sprays into both nostrils daily. 16 g 6  . gabapentin (NEURONTIN) 300 MG capsule Take 1-2 capsules (300-600 mg total) by mouth at bedtime. 180 capsule 3  . HYDROcodone-acetaminophen (NORCO/VICODIN) 5-325 MG tablet Take 0.5-1 tablets by mouth 2 (two) times daily as needed (No more than 1-2x a week use.). 30 tablet 0  .  Insulin Pen Needle 29G X 94.7ML MISC 1 application by Does not apply route daily. 100 each 3  . MAGNESIUM PO Take 1 capsule by mouth daily.    Marland Kitchen NEEDLE, DISP, 23 G 23G X 1-1/2" MISC Use as directed 100 each 3  . Omega-3 Fatty Acids (FISH OIL) 1000 MG CAPS Take 1 capsule by mouth 2 (two) times daily.     . Saw Palmetto, Serenoa repens, (SAW PALMETTO PO) Take 1 capsule by mouth 2 (two) times daily.    . SYRINGE-NEEDLE, DISP, 3 ML 22G X 1-1/2" 3 ML MISC Use as directed. 100 each 3  . testosterone cypionate (DEPO-TESTOSTERONE) 200 MG/ML injection Inject 1 mL (200 mg total) into the muscle every 14 (fourteen) days. 10 mL 5   No current facility-administered medications for this visit.     Allergies-reviewed and updated Allergies  Allergen Reactions  . Bee Venom Anaphylaxis  . Flagyl [Metronidazole Hcl]   . Nsaids     REACTION: intolereance    Social History   Social History  . Marital status: Married    Spouse name: N/A  . Number of children: 0  . Years of education: 11   Occupational History  . 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 Smoking status: Never Smoker  . Smokeless tobacco: Never Used  . Alcohol use Yes     Comment: rare occ  . Drug use: No  . Sexual activity: Yes    Partners: Female   Other Topics Concern  . Not on file   Social History Narrative   HSG, College grad, [place]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 Married 30 + years. No children, lots of critters.       Cancer Survivor- carcinoid lung cancer.   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 Martin Majestic back to work for ability to be insured.       Hobbies: music, home repair/improvement, cats    Objective: BP (!) 148/86 (BP Location: Left Arm, Patient Position: Sitting, Cuff Size: Normal)   Pulse 72   Temp 98.1 F (36.7 C) (Oral)   Ht 5' 7.25" (1.708 m)   Wt 186 lb 3.2 oz (84.5 kg)   SpO2 96%   BMI 28.95 kg/m    Gen: NAD, resting comfortably HEENT: Mucous membranes are moist. Oropharynx normal Neck: no thyromegaly CV: RRR no murmurs rubs or gallops Lungs: CTAB no crackles, wheeze, rhonchi Abdomen: soft/nontender/nondistended/normal bowel sounds. No rebound or guarding.  Ext: no edema Skin: warm, dry Neuro: grossly normal, moves all extremities, PERRLA Rectal: normal tone, normal sized prostate, no masses or tenderness   Assessment/Plan:  65 y.o. male presenting for annual physical.  Health Maintenance  counseling: 1. Anticipatory guidance: Patient counseled regarding regular dental exams, eye exams, wearing seatbelts.  2. Risk factor reduction:  Advised patient of need for regular exercise and diet rich and fruits and vegetables to reduce risk of heart attack and stroke. States has been down on exercise due to a project- had been doing a lot of treadmill previously- needs to bump back up.  3. Immunizations/screenings/ancillary studies- advised flu shot today, will get at work and send me a message Immunization History  Administered Date(s) Administered  . Influenza Whole 01/04/2012  . Influenza,inj,Quad PF,36+ Mos 12/17/2014  . Influenza-Unspecified 01/17/2014  . Pneumococcal Conjugate-13 06/17/2015  . Td 02/27/2001, 04/10/2011  . Zoster 08/24/2010    4. Prostate cancer screening- low risk based on most recent PSA and rectal today , repeat PSA next year physical- hold off as already had one 6 months ago Lab Results  Component Value Date   PSA 0.32 06/17/2015   PSA 0.33 10/09/2014   PSA 0.21 02/26/2014   5. Colon cancer screening - 06/21/2007 with 10 year repeat 6. Skin cancer screening- family history of melanoma- wants to establish may try [facility] dermatology  Status of chronic or acute concerns  Lung cancer- carcinoid treated with surgery alone partial lobectomy. Getting chest CT next Wednesday- then will see Dr. Earlie Server  Low testosterone- treated through Charlann Boxer of sports  medicine. We will update today.   Hypertriglyceridemia S:  controlled on no rx- previously high triglycerides. No myalgias.  Lab Results  Component Value Date   CHOL 157 05/27/2014   HDL 48.90 05/27/2014   LDLCALC 92 05/27/2014   TRIG 81.0 05/27/2014   CHOLHDL 3 05/27/2014   A/P: can continue aspirin for primary prevention  Neuropathy- continues vicodin 1-2x a week. Sports medicine Dr. Tamala Julian- gabapentin is being weaned. Could not tolerate effexor.   GAD- klonopin generally 6-8 per week Depression-   GERD_ on pepcid '40mg'$  just prn  Testosterone, vit d- low vit D, dhea - has been followed for abnormalities through Dr. Tamala Julian- update labs today  Essential hypertension controlled on amlodipine '5mg'$  generally- 10% of readings above 140 at home. Mild elevation today- still at goal per [facility] and age  Depression continues on wellbutrin '300mg'$ . effexor could not sleep well at all for 2 days. Would like to see psychiatry or counselor (given info for both- like to try [employer] first and then our counselors if not good fit)- continues to be down about loss of mother this year- major stressor with him and family. No SI/HI.   6 months  Orders Placed This Encounter  Procedures  . CBC    [place]  . Comprehensive metabolic panel    [place]    Order Specific Question:   Has the patient fasted?    Answer:   No  . Lipid panel    [place]    Order Specific Question:   Has the patient fasted?    Answer:   No  . VITAMIN D 25 Hydroxy (Vit-D Deficiency, Fractures)    [place]  . Ambulatory referral to Dermatology    Referral Priority:   Routine    Referral Type:   Consultation    Referral Reason:   Specialty Services Required    Requested Specialty:   Dermatology    Number of Visits Requested:   1  . POCT Urinalysis Dipstick (Automated)    Meds ordered this encounter  Medications  . amLODipine (NORVASC) 5 MG tablet    Sig: Take 1 tablet (5  mg total) by mouth daily.    Dispense:  90  tablet    Refill:  3  . HYDROcodone-acetaminophen (NORCO/VICODIN) 5-325 MG tablet    Sig: Take 0.5-1 tablets by mouth 2 (two) times daily as needed (No more than 1-2x a week use.).    Dispense:  30 tablet    Refill:  0  . clonazePAM (KLONOPIN) 0.5 MG tablet    Sig: Take 1 tablet (0.5 mg total) by mouth 2 (two) times daily as needed.    Dispense:  60 tablet    Refill:  3    Return precautions advised.   [name], MD

## 2015-12-10 NOTE — Progress Notes (Signed)
Pre visit review using our clinic review tool, if applicable. No additional management support is needed unless otherwise documented below in the visit note. 

## 2015-12-11 LAB — VITAMIN D 25 HYDROXY (VIT D DEFICIENCY, FRACTURES): VITD: 33.31 ng/mL (ref 30.00–100.00)

## 2015-12-12 MED FILL — HYDROCODON-APAP 5-325: 5-325 | 52 days supply | Qty: 30 | Fill #0

## 2015-12-12 MED FILL — clonazePAM 0.5 MG TABS: 0.5 | 30 days supply | Qty: 60 | Fill #0

## 2015-12-15 ENCOUNTER — Ambulatory Visit: Payer: 59 | Admitting: Family Medicine

## 2015-12-15 MED FILL — TESTOSTERONE CYP 200 MG/ML: 200 | 84 days supply | Qty: 6 | Fill #0

## 2015-12-15 NOTE — Telephone Encounter (Signed)
Refill done.  

## 2015-12-16 ENCOUNTER — Encounter: Payer: Self-pay | Admitting: Family Medicine

## 2015-12-16 ENCOUNTER — Other Ambulatory Visit: Payer: 59

## 2015-12-17 ENCOUNTER — Encounter (HOSPITAL_COMMUNITY): Payer: Self-pay

## 2015-12-17 ENCOUNTER — Encounter: Payer: Self-pay | Admitting: Family Medicine

## 2015-12-17 ENCOUNTER — Ambulatory Visit (HOSPITAL_COMMUNITY)
Admission: RE | Admit: 2015-12-17 | Discharge: 2015-12-17 | Disposition: A | Discharge status: Home / Self Care | Payer: 59 | Source: Ambulatory Visit | Attending: Internal Medicine | Admitting: Internal Medicine

## 2015-12-17 DIAGNOSIS — C3431 Malignant neoplasm of lower lobe, right bronchus or lung: Secondary | ICD-10-CM | POA: Diagnosis not present

## 2015-12-17 DIAGNOSIS — C3411 Malignant neoplasm of upper lobe, right bronchus or lung: Secondary | ICD-10-CM | POA: Diagnosis not present

## 2015-12-23 ENCOUNTER — Encounter: Payer: Self-pay | Admitting: Family Medicine

## 2015-12-23 ENCOUNTER — Ambulatory Visit (INDEPENDENT_AMBULATORY_CARE_PROVIDER_SITE_OTHER): Payer: 59 | Admitting: Family Medicine

## 2015-12-23 ENCOUNTER — Encounter: Payer: Self-pay | Admitting: Internal Medicine

## 2015-12-23 ENCOUNTER — Ambulatory Visit (HOSPITAL_BASED_OUTPATIENT_CLINIC_OR_DEPARTMENT_OTHER): Payer: 59 | Admitting: Internal Medicine

## 2015-12-23 VITALS — BP 148/84 | HR 75 | Temp 97.3°F | Wt 183.4 lb

## 2015-12-23 VITALS — BP 139/76 | HR 76 | Temp 98.0°F | Resp 18 | Ht 67.25 in | Wt 184.5 lb

## 2015-12-23 DIAGNOSIS — C3431 Malignant neoplasm of lower lobe, right bronchus or lung: Secondary | ICD-10-CM

## 2015-12-23 DIAGNOSIS — L03115 Cellulitis of right lower limb: Secondary | ICD-10-CM | POA: Diagnosis not present

## 2015-12-23 DIAGNOSIS — Z85118 Personal history of other malignant neoplasm of bronchus and lung: Secondary | ICD-10-CM

## 2015-12-23 MED ORDER — CEPHALEXIN 500 MG PO CAPS: 500.0000 mg | ORAL_CAPSULE | Freq: Four times a day (QID) | ORAL | 0 refills | Status: DC

## 2015-12-23 NOTE — Progress Notes (Signed)
See mychart message for details Upper improving Lower- draining more, more pain, redness expanding some  Subjective:  [name] Page is a 65 y.o. year old very pleasant male patient who presents for/with See problem oriented charting ROS- no fever, chills, nausea, vomiting.see any ROS included in HPI as well.   Past Medical History-  Patient Active Problem List   Diagnosis Date Noted  . Lung cancer ([place]) 12/11/2012    Priority: High  . Generalized anxiety disorder 03/12/2014    Priority: Medium  . Essential hypertension 03/12/2014    Priority: Medium  . Hereditary and idiopathic peripheral neuropathy 12/22/2012    Priority: Medium  . Dyspnea 07/14/2012    Priority: Medium  . Hyperlipidemia 07/14/2012    Priority: Medium  . Depression 02/16/2010    Priority: Medium  . Low testosterone 11/21/2007    Priority: Medium  . Eustachian tube dysfunction 12/17/2014    Priority: Low  . Nonspecific abnormal electrocardiogram (ECG) (EKG) 07/14/2012    Priority: Low  . Allergic reaction, history of 10/25/2011    Priority: Low  . Rectal or anal pain 10/01/2010    Priority: Low  . Allergic rhinitis 06/13/2007    Priority: Low  . GERD 06/13/2007    Priority: Low  . IBS (irritable bowel syndrome) 06/13/2007    Priority: Low  . 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 Peroneal tendonitis of right lower extremity 05/08/2014  . SI (sacroiliac) joint dysfunction 09/19/2013  . Nonallopathic lesion of sacral region 09/19/2013  . Nonallopathic lesion of thoracic region 09/19/2013  . Nonallopathic lesion of lumbosacral region 09/19/2013  . Right foot pain 12/22/2012    Medications- reviewed and updated Current Outpatient Prescriptions  Medication Sig Dispense Refill  . amLODipine (NORVASC) 5 MG tablet Take 1 tablet (5 mg total) by mouth daily. 90 tablet 3  . aspirin 81 MG tablet Take 81 mg by mouth daily.    Marland Kitchen b complex vitamins tablet Take 1 tablet by mouth daily.      Marland Kitchen buPROPion  (WELLBUTRIN XL) 300 MG 24 hr tablet TAKE 1 TABLET BY MOUTH EVERY DAY 90 tablet 3  . Cholecalciferol (VITAMIN D) 2000 UNITS CAPS Take 1 capsule by mouth daily.     . clonazePAM (KLONOPIN) 0.5 MG tablet Take 1 tablet (0.5 mg total) by mouth 2 (two) times daily as needed. 60 tablet 3  . Coenzyme Q10 (CO Q 10 PO) Take 1 capsule by mouth daily.    . cyanocobalamin (,VITAMIN B-12,) 1000 MCG/ML injection Inject 1 mL (1,000 mcg total) into the muscle every 30 (thirty) days. weekly 30 mL 1  . EPINEPHrine (EPIPEN 2-PAK) 0.3 mg/0.3 mL IJ SOAJ injection USE AS NEEDED 2 Device 1  . fexofenadine (ALLEGRA) 180 MG tablet Take 180 mg by mouth daily as needed.     . fluticasone (FLONASE) 50 MCG/ACT nasal spray Place 2 sprays into both nostrils daily. 16 g 6  . gabapentin (NEURONTIN) 300 MG capsule Take 1-2 capsules (300-600 mg total) by mouth at bedtime. 180 capsule 3  . HYDROcodone-acetaminophen (NORCO/VICODIN) 5-325 MG tablet Take 0.5-1 tablets by mouth 2 (two) times daily as needed (No more than 1-2x a week use.). 30 tablet 0  . Insulin Pen Needle 29G X 40.0QQ MISC 1 application by Does not apply route daily. 100 each 3  . MAGNESIUM PO Take 1 capsule by mouth daily.    Marland Kitchen NEEDLE, DISP, 23 G 23G X 1-1/2" MISC Use as directed 100 each 3  . Omega-3 Fatty Acids (  FISH OIL) 1000 MG CAPS Take 1 capsule by mouth 2 (two) times daily.     . Saw Palmetto, Serenoa repens, (SAW PALMETTO PO) Take 1 capsule by mouth 2 (two) times daily.    . SYRINGE-NEEDLE, DISP, 3 ML 22G X 1-1/2" 3 ML MISC Use as directed. 100 each 3  . testosterone cypionate (DEPOTESTOSTERONE CYPIONATE) 200 MG/ML injection INJECT 1 ML INTO THE MUSCLE EVERY 14 DAYS 10 mL 5  . cephALEXin (KEFLEX) 500 MG capsule Take 1 capsule (500 mg total) by mouth 4 (four) times daily. 28 capsule 0   No current facility-administered medications for this visit.     Objective: BP (!) 148/84 (BP Location: Left Arm, Patient Position: Sitting, Cuff Size: Normal)   Pulse 75    Temp 97.3 F (36.3 C) (Oral)   Wt 183 lb 6.4 oz (83.2 kg)   SpO2 98%   BMI 28.51 kg/m  Gen: NAD, resting comfortably CV: RRR no murmurs rubs or gallops Lungs: CTAB no crackles, wheeze, rhonchi Abdomen: soft/nontender/nondistended/normal bowel sounds. No rebound or guarding.  See below     Images attached are loaded from mychart.  Ext: no edema on left, on right side there is 1+ edema but also focal edema near upper wound on lower leg and less so near lower wound on lower leg. The upperwound is awarm to touch and mildly tender with increased erythema from picture. Lower wound is moderately tender with expanding redness outside of wound borders that are reduced from mychart picture sent. Bandage is nearly soaked with serosanguinous fluid.  Skin: warm, dry, rash as noted above 2+ pulses  Assessment/Plan:  Cellulitis S: patient has been working hard trying to lose weight/exercise better to help with BP. He has been up working on treadmill and tried to get tv remote after he dropped it while on treadmill and felldown. Described initial "road rash" about a week ago through [employer]. He has continued to walk since that time. He sent me a picture the following day and it appeared to be a pulling away of skin/road rash as described. He presents now because upper wound has warmth and loer wound continues to drain and seems to have expanding redness around it.  A/P: Td up to date 2013. Wounds with potential infection given warmth in upper wound and expanding redness in lower wound and continued drainage- would prefer more of drying approach but he states overall woundis decreasing despite redness expanding. Concern is for potential infection development- will cover with keflex with return precautions given. He also wants to hold his aspirin short term  Meds ordered this encounter  Medications  . DISCONTD: cephALEXin (KEFLEX) 500 MG capsule    Sig: Take 1 capsule (500 mg total) by mouth 4 (four) times  daily.    Dispense:  28 capsule    Refill:  0  . cephALEXin (KEFLEX) 500 MG capsule    Sig: Take 1 capsule (500 mg total) by mouth 4 (four) times daily.    Dispense:  28 capsule    Refill:  0   New acute issue in high risk patient with history of peripheral neuropathy (which could have contributed to fall)  Return precautions advised.  [name], MD

## 2015-12-23 NOTE — Progress Notes (Signed)
Pre visit review using our clinic review tool, if applicable. No additional management support is needed unless otherwise documented below in the visit note. 

## 2015-12-23 NOTE — Progress Notes (Signed)
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  INTERVAL HISTORY: [name] Page 65 y.o. male returns to the clinic today for annual followup visit. The patient is feeling fine today with no specific complaints except for indigestion. He stopped taking PPI and H2 blocker for concern about dementia. He denied having any significant chest pain, shortness of breath, cough or hemoptysis. He denied having any weight loss or night sweats. He has repeat CT scan of the chest performed recently and he is here for evaluation and discussion of his scan results.  MEDICAL HISTORY: Past Medical History:  Diagnosis Date  . Allergic rhinitis, cause unspecified   . Depressive disorder, not elsewhere classified   . Disturbance of skin sensation   . Esophageal reflux   . lung ca dx'd 09/2009   surg only  . Lymphocytic colitis   . Other abnormal blood chemistry   . Other diseases of lung, not elsewhere classified   . Other testicular hypofunction   . Personal history of urinary calculi     ALLERGIES:  is allergic to bee venom; flagyl [metronidazole hcl]; and nsaids.  MEDICATIONS:  Current Outpatient Prescriptions  Medication Sig Dispense Refill  . amLODipine (NORVASC) 5 MG tablet Take 1 tablet (5 mg total) by mouth daily. 90 tablet 3  . aspirin 81 MG tablet Take 81 mg by mouth daily.    Marland Kitchen b complex vitamins tablet Take 1 tablet by mouth daily.      Marland Kitchen buPROPion (WELLBUTRIN XL) 300 MG 24 hr tablet TAKE 1 TABLET BY MOUTH EVERY DAY 90 tablet 3  . Cholecalciferol (VITAMIN D) 2000 UNITS CAPS Take 1 capsule by mouth daily.     . clonazePAM (KLONOPIN) 0.5 MG tablet Take 1 tablet  (0.5 mg total) by mouth 2 (two) times daily as needed. 60 tablet 3  . Coenzyme Q10 (CO Q 10 PO) Take 1 capsule by mouth daily.    . cyanocobalamin (,VITAMIN B-12,) 1000 MCG/ML injection Inject 1 mL (1,000 mcg total) into the muscle every 30 (thirty) days. weekly 30 mL 1  . EPINEPHrine (EPIPEN 2-PAK) 0.3 mg/0.3 mL IJ SOAJ injection USE AS NEEDED 2 Device 1  . fexofenadine (ALLEGRA) 180 MG tablet Take 180 mg by mouth daily as needed.     . fluticasone (FLONASE) 50 MCG/ACT nasal spray Place 2 sprays into both nostrils daily. 16 g 6  . gabapentin (NEURONTIN) 300 MG capsule Take 1-2 capsules (300-600 mg total) by mouth at bedtime. 180 capsule 3  . HYDROcodone-acetaminophen (NORCO/VICODIN) 5-325 MG tablet Take 0.5-1 tablets by mouth 2 (two) times daily as needed (No more than 1-2x a week use.). 30 tablet 0  . Insulin Pen Needle 29G X 96.7RF MISC 1 application by Does not apply route daily. 100 each 3  . MAGNESIUM PO Take 1 capsule by mouth daily.    Marland Kitchen NEEDLE, DISP, 23 G 23G X 1-1/2" MISC Use as directed 100 each 3  . Omega-3 Fatty Acids (FISH OIL) 1000 MG CAPS Take 1 capsule by mouth 2 (two) times daily.     . Saw Palmetto, Serenoa repens, (SAW PALMETTO PO) Take 1 capsule by mouth 2 (two) times daily.    Marland Kitchen  SYRINGE-NEEDLE, DISP, 3 ML 22G X 1-1/2" 3 ML MISC Use as directed. 100 each 3  . testosterone cypionate (DEPOTESTOSTERONE CYPIONATE) 200 MG/ML injection INJECT 1 ML INTO THE MUSCLE EVERY 14 DAYS 10 mL 5   No current facility-administered medications for this visit.     SURGICAL HISTORY:  Past Surgical History:  Procedure Laterality Date  . 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 parotid tumor surgery    . TONSILLECTOMY AND ADENOIDECTOMY      REVIEW OF SYSTEMS:  A comprehensive review of systems was negative.   PHYSICAL EXAMINATION: General appearance: alert, cooperative and no distress Head: Normocephalic, without obvious abnormality,  atraumatic Neck: no adenopathy Lymph nodes: Cervical, supraclavicular, and axillary nodes normal. Resp: clear to auscultation bilaterally Cardio: regular rate and rhythm, S1, S2 normal, no murmur, click, rub or gallop GI: soft, non-tender; bowel sounds normal; no masses,  no organomegaly Extremities: extremities normal, atraumatic, no cyanosis or edema  ECOG PERFORMANCE STATUS: 0 - Asymptomatic  Blood pressure 139/76, pulse 76, temperature 98 F (36.7 C), temperature source Oral, resp. rate 18, height 5' 7.25" (1.708 m), weight 184 lb 8 oz (83.7 kg), SpO2 96 %.  LABORATORY DATA: Lab Results  Component Value Date   WBC 7.1 12/10/2015   HGB 16.3 12/10/2015   HCT 47.5 12/10/2015   MCV 94.2 12/10/2015   PLT 232.0 12/10/2015      Chemistry      Component Value Date/Time   NA 138 12/10/2015 0857   NA 139 12/11/2014 0953   K 4.6 12/10/2015 0857   K 4.7 12/11/2014 0953   CL 102 12/10/2015 0857   CO2 30 12/10/2015 0857   CO2 27 12/11/2014 0953   BUN 9 12/10/2015 0857   BUN 15.7 12/11/2014 0953   CREATININE 1.01 12/10/2015 0857   CREATININE 1.2 12/11/2014 0953      Component Value Date/Time   CALCIUM 9.3 12/10/2015 0857   CALCIUM 9.4 12/11/2014 0953   ALKPHOS 67 12/10/2015 0857   ALKPHOS 63 12/11/2014 0953   AST 16 12/10/2015 0857   AST 36 (H) 12/11/2014 0953   ALT 20 12/10/2015 0857   ALT 31 12/11/2014 0953   BILITOT 1.3 (H) 12/10/2015 0857   BILITOT 1.99 (H) 12/11/2014 0953       RADIOGRAPHIC STUDIES: Ct Chest Wo Contrast  Result Date: 12/17/2015 CLINICAL DATA:  Restaging lung cancer.  Initial diagnosis June 2011. EXAM: CT CHEST WITHOUT CONTRAST TECHNIQUE: Multidetector CT imaging of the chest was performed following the standard protocol without IV contrast. COMPARISON:  12/11/2014 FINDINGS: Chest wall: No chest wall mass, supraclavicular or axillary lymphadenopathy. The thyroid gland is grossly normal. Cardiovascular: The heart is normal in size. No pericardial  effusion. The aorta is normal in caliber. No significant atherosclerotic calcifications. Mediastinum/Nodes: Small scattered mediastinal and hilar lymph nodes are stable. No mass or adenopathy. The esophagus is grossly normal and stable. Lungs/Pleura: No acute pulmonary findings. Stable surgical changes involving the right lower lobe. No findings for recurrent tumor. No new pulmonary lesions. No pleural effusions or pleural nodules. Stable eventration of the right hemidiaphragm. Upper Abdomen: No significant findings. No evidence of hepatic or adrenal gland metastatic disease. No upper abdominal lymphadenopathy. Musculoskeletal: No significant bony findings. Surgical changes involving the right ribs from prior thoracotomy. IMPRESSION: Stable surgical changes involving the right lower lobe but no evidence of residual or recurrent tumor, mediastinal or hilar lymphadenopathy or new pulmonary lesions. Electronically Signed   By: Mamie Nick.  Gallerani M.D.   On: 12/17/2015 12:01   ASSESSMENT AND PLAN: This is a very pleasant 65 years old white male with history of stage IA pulmonary carcinoid tumor status post right lower lobe segmentectomy with lymph node dissection has been observation since July 2011 with no evidence for disease recurrence. The recent CT scan of the chest showed no evidence for disease recurrence. I discussed the scan results with the patient. I recommended for the patient to continue on observation with his primary care physician at this point. I'll be happy to see him in the future if needed.  He was advised to call immediately if he has any concerning symptoms in the interval.  The patient voices understanding of current disease status and treatment options and is in agreement with the current care plan.  All questions were answered. The patient knows to call the clinic with any problems, questions or concerns. We can certainly see the patient much sooner if necessary.  Disclaimer: This note was  dictated with voice recognition software. Similar sounding words can inadvertently be transcribed and may be missed upon review.

## 2015-12-23 NOTE — Patient Instructions (Addendum)
Want to cover for infection with keflex- upper wound improving but warm, erythematous. Lower wound- redness seems to be creeping out and painful and swollen.   If fevers, worsening pain, expanding redness after 48 hours- return to care

## 2015-12-28 ENCOUNTER — Telehealth: Payer: Self-pay | Admitting: Internal Medicine

## 2015-12-28 NOTE — Telephone Encounter (Signed)
No regular f/u per 9/19 los

## 2015-12-29 ENCOUNTER — Encounter: Payer: Self-pay | Admitting: Family Medicine

## 2015-12-29 MED ORDER — CEPHALEXIN 500 MG PO CAPS: 500.0000 mg | ORAL_CAPSULE | Freq: Four times a day (QID) | ORAL | 0 refills | Status: DC

## 2015-12-29 NOTE — Telephone Encounter (Signed)
See note

## 2016-01-02 ENCOUNTER — Ambulatory Visit: Payer: 59 | Admitting: Family Medicine

## 2016-01-08 ENCOUNTER — Encounter: Payer: Self-pay | Admitting: Family Medicine

## 2016-01-08 DIAGNOSIS — L570 Actinic keratosis: Secondary | ICD-10-CM | POA: Diagnosis not present

## 2016-01-08 DIAGNOSIS — L821 Other seborrheic keratosis: Secondary | ICD-10-CM | POA: Diagnosis not present

## 2016-01-08 DIAGNOSIS — D235 Other benign neoplasm of skin of trunk: Secondary | ICD-10-CM | POA: Diagnosis not present

## 2016-01-08 DIAGNOSIS — L309 Dermatitis, unspecified: Secondary | ICD-10-CM | POA: Diagnosis not present

## 2016-01-08 DIAGNOSIS — L814 Other melanin hyperpigmentation: Secondary | ICD-10-CM | POA: Diagnosis not present

## 2016-01-08 DIAGNOSIS — R208 Other disturbances of skin sensation: Secondary | ICD-10-CM | POA: Diagnosis not present

## 2016-01-08 DIAGNOSIS — D1801 Hemangioma of skin and subcutaneous tissue: Secondary | ICD-10-CM | POA: Diagnosis not present

## 2016-01-08 MED FILL — TRIAMCINOLONE 0.1% CREAM: 0.1 | 30 days supply | Qty: 80 | Fill #0

## 2016-01-09 MED FILL — clonazePAM 0.5 MG TABS: 0.5 | 30 days supply | Qty: 60 | Fill #1

## 2016-01-12 ENCOUNTER — Other Ambulatory Visit: Payer: 59

## 2016-01-12 ENCOUNTER — Other Ambulatory Visit: Payer: Self-pay | Admitting: *Deleted

## 2016-01-12 DIAGNOSIS — R7989 Other specified abnormal findings of blood chemistry: Secondary | ICD-10-CM

## 2016-01-12 DIAGNOSIS — E349 Endocrine disorder, unspecified: Secondary | ICD-10-CM | POA: Diagnosis not present

## 2016-01-12 DIAGNOSIS — E668 Other obesity: Secondary | ICD-10-CM

## 2016-01-13 LAB — TESTOSTERONE, FREE, TOTAL, SHBG
SEX HORMONE BINDING: 50.8 nmol/L (ref 19.3–76.4)
TESTOSTERONE FREE: 6.9 pg/mL (ref 6.6–18.1)
Testosterone: 348 ng/dL (ref 264–916)

## 2016-01-16 LAB — DHEA: DHEA: 149 ng/dL (ref 61–1636)

## 2016-01-18 NOTE — Progress Notes (Signed)
Low as well.  We likely need to go up on your dose of testosterone or increase frequency.

## 2016-01-21 DIAGNOSIS — H5213 Myopia, bilateral: Secondary | ICD-10-CM | POA: Diagnosis not present

## 2016-01-21 DIAGNOSIS — H524 Presbyopia: Secondary | ICD-10-CM | POA: Diagnosis not present

## 2016-01-21 DIAGNOSIS — H52203 Unspecified astigmatism, bilateral: Secondary | ICD-10-CM | POA: Diagnosis not present

## 2016-01-23 ENCOUNTER — Encounter: Payer: Self-pay | Admitting: Family Medicine

## 2016-02-04 NOTE — Progress Notes (Signed)
Subjective:    CC: Peripheral neuropathy follow up low testosterone.    Patient is following up for his peripheral neuropathy. Patient was started on B12 injections and was found to have a low testosterone.  Patient increase his testosterone injections. Patient did have labs 01/12/2016 showing the patient testosterone did go lowered to 348. Medical [facility] over 500. Patient was to increase frequency of testosterone injection.  Patient states no true fatigue but still gets tired at around lunch.    Patient's recently and loss of his mother and was not exercises as regularly. Patient states  Still having some difficulty and states that the Effexor was not helpful. Patient is going to start another medication to see if that will be more beneficial as well as with the pain. Has read a lot about Cymbalta.  DHEA 577<--281 Cortisol 7.9 Lab Results  Component Value Date   TESTOSTERONE 348 01/12/2016     Past Medical History:  Diagnosis Date  . Allergic rhinitis, cause unspecified   . Depressive disorder, not elsewhere classified   . Disturbance of skin sensation   . Esophageal reflux   . lung ca dx'd 09/2009   surg only  . Lymphocytic colitis   . Other abnormal blood chemistry   . Other diseases of lung, not elsewhere classified   . Other testicular hypofunction   . Personal history of urinary calculi    Past Surgical History:  Procedure Laterality Date  . 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 parotid tumor surgery    . TONSILLECTOMY AND ADENOIDECTOMY     Social History  Substance Use Topics  . Smoking status: Never Smoker  . Smokeless tobacco: Never Used  . Alcohol use Yes     Comment: rare occ   Allergies  Allergen Reactions  . Bee Venom Anaphylaxis  . Flagyl [Metronidazole Hcl]   . Nsaids     REACTION: intolereance   Family History  Problem Relation Age of Onset  . Hypertension Mother   . Anxiety disorder Mother   .  Non-Hodgkin's lymphoma Mother   . Dementia Father   . Depression Father     and anxiety  . Hypertension Father   . Early death Neg Hx   . Heart disease Neg Hx     mother in 55s had lesoin     Past medical history, social, surgical and family history all reviewed and no pertinent info pertaining to chief complaint. All other was reviewed using the EMR.    Review of Systems: No fevers, chills, night sweats, weight loss, chest pain, or shortness of breath.   Objective:   Blood pressure 138/80, pulse 81, height 5' 7.25" (1.708 m), weight 188 lb (85.3 kg), SpO2 98 %.  Systems examined below as of 02/05/16 General: NAD A&O x3 mood, affect normal  HEENT: Pupils equal, extraocular movements intact no nystagmus Respiratory: not short of breath at rest or with speaking Cardiovascular: No lower extremity edema, non tender Skin: Warm dry intact with no signs of infection or rash on extremities or on axial skeleton. Abdomen: Soft nontender, no masses Neuro: Cranial nerves  intact, neurovascularly intact in all extremities with 2+ DTRs and 2+ pulses. Lymph: No lymphadenopathy appreciated today  Gait normal with good balance and coordination.  MSK: Non tender with full range of motion and good stability and symmetric strength and tone of shoulders, elbows, wrist,  knee hips and ankles bilaterally.    Back Exam:  Inspection: Unremarkable  Motion: Flexion 45 deg, Extension 25 deg, Side Bending to 35 deg bilaterally,  Rotation to 35 deg bilaterally  SLR laying: Negative  XSLR laying: Negative  Palpable tenderness: Nontender on exam FABER: negative for continued tightness Sensory change: Gross sensation intact to all lumbar and sacral dermatomes.  Reflexes: 2+ at both patellar tendons, 2+ at achilles tendons, Babinski's downgoing.  Strength at foot  Plantar-flexion: 5/5 Dorsi-flexion: 5/5 Eversion: 5/5 Inversion: 5/5  Leg strength  Quad: 5/5 Hamstring: 5/5 Hip flexor: 5/5 Hip abductors: 4+  /5  Gait unremarkable. No change in physical exam at all at this point.  Impression and Recommendations:

## 2016-02-05 ENCOUNTER — Encounter: Payer: Self-pay | Admitting: Family Medicine

## 2016-02-05 ENCOUNTER — Ambulatory Visit (INDEPENDENT_AMBULATORY_CARE_PROVIDER_SITE_OTHER): Payer: 59 | Admitting: Family Medicine

## 2016-02-05 VITALS — BP 138/80 | HR 81 | Ht 67.25 in | Wt 188.0 lb

## 2016-02-05 DIAGNOSIS — E349 Endocrine disorder, unspecified: Secondary | ICD-10-CM | POA: Diagnosis not present

## 2016-02-05 DIAGNOSIS — R7989 Other specified abnormal findings of blood chemistry: Secondary | ICD-10-CM

## 2016-02-05 MED ORDER — DULOXETINE HCL 20 MG PO CPEP: 20.0000 mg | ORAL_CAPSULE | Freq: Every day | ORAL | 1 refills | Status: DC

## 2016-02-05 NOTE — Patient Instructions (Signed)
goodd to see you  Changes to testosterone of '150mg'$  every 3 weeks and we will recheck in 6 weeks.  Cymbalta '20mg'$  daily  Stop the effexor which I know you did.  Look up [place] recovery sandals for in the house.  Lets touch base on my chart on lab draw and see what is going on.  Happy holidays!

## 2016-02-05 NOTE — Assessment & Plan Note (Signed)
Patient continues on the testosterone at this time. Did have some decreasing values again. We discussed increasing 250 mg every 3 weeks. Hopefully this will be beneficial for patient. Encourage him to continue on the gabapentin. Did start him on Cymbalta to help him with some of the fatigue factor. Over this will help with the neuropathy. Started low dose. Continued Wellbutrin for now but consider decreasing at some point. Patient will follow-up again in 4 weeks.  Spent  25 minutes with patient face-to-face and had greater than 50% of counseling including as described above in assessment and plan.

## 2016-02-09 MED FILL — clonazePAM 0.5 MG TABS: 0.5 | 30 days supply | Qty: 60 | Fill #2

## 2016-02-09 MED FILL — BUPROPION HCL XL 300 MG TAB: 300 | 90 days supply | Qty: 90 | Fill #2

## 2016-02-09 MED FILL — DULoxetine HCL 20 MG CPEP: 20 | 30 days supply | Qty: 30 | Fill #0

## 2016-03-08 MED FILL — clonazePAM 0.5 MG TABS: 0.5 | 30 days supply | Qty: 60 | Fill #3

## 2016-03-08 MED FILL — TESTOSTERONE CYP 200 MG/ML: 200 | 84 days supply | Qty: 6 | Fill #1

## 2016-03-08 MED FILL — AMLODIPINE BESYLATE 5 MG TA: 5 | 90 days supply | Qty: 90 | Fill #1

## 2016-03-18 ENCOUNTER — Other Ambulatory Visit: Payer: 59

## 2016-03-18 DIAGNOSIS — E349 Endocrine disorder, unspecified: Secondary | ICD-10-CM | POA: Diagnosis not present

## 2016-03-18 DIAGNOSIS — R7989 Other specified abnormal findings of blood chemistry: Secondary | ICD-10-CM

## 2016-03-20 LAB — TESTOSTERONE, FREE, TOTAL, SHBG
SEX HORMONE BINDING: 43 nmol/L (ref 19.3–76.4)
TESTOSTERONE: 648 ng/dL (ref 264–916)
Testosterone, Free: 20.2 pg/mL — ABNORMAL HIGH (ref 6.6–18.1)

## 2016-03-23 ENCOUNTER — Encounter: Payer: Self-pay | Admitting: Family Medicine

## 2016-04-21 ENCOUNTER — Other Ambulatory Visit: Payer: Self-pay | Admitting: Family Medicine

## 2016-04-21 ENCOUNTER — Encounter: Payer: Self-pay | Admitting: Family Medicine

## 2016-04-23 ENCOUNTER — Other Ambulatory Visit: Payer: Self-pay

## 2016-04-23 MED ORDER — HYDROCODONE-ACETAMINOPHEN 5-325 MG PO TABS: 0.5000 | ORAL_TABLET | Freq: Two times a day (BID) | ORAL | 0 refills | Status: DC | PRN

## 2016-04-28 ENCOUNTER — Encounter: Payer: Self-pay | Admitting: Family Medicine

## 2016-04-29 MED FILL — HYDROCODON-APAP 5-325: 5-325 | 52 days supply | Qty: 30 | Fill #0

## 2016-05-11 ENCOUNTER — Other Ambulatory Visit: Payer: Self-pay | Admitting: Family Medicine

## 2016-05-11 MED FILL — CYANOCOBALAMIN 1,000 MCG/ML: 1000 | 90 days supply | Qty: 3 | Fill #0

## 2016-05-11 MED FILL — BUPROPION HCL XL 300 MG TAB: 300 | 90 days supply | Qty: 90 | Fill #3

## 2016-05-11 MED FILL — TRIAMCINOLONE 0.1% CREAM: 0.1 | 30 days supply | Qty: 80 | Fill #1

## 2016-05-11 MED FILL — GABAPENTIN 300 MG CAPSULE: 300 | 90 days supply | Qty: 180 | Fill #0

## 2016-05-11 MED FILL — clonazePAM 0.5 MG TABS: 0.5 | 30 days supply | Qty: 60 | Fill #0

## 2016-05-11 NOTE — Telephone Encounter (Signed)
Refill done.  

## 2016-06-08 MED FILL — AMLODIPINE BESYLATE 5 MG TA: 5 | 90 days supply | Qty: 90 | Fill #2

## 2016-06-10 MED FILL — clonazePAM 0.5 MG TABS: 0.5 | 30 days supply | Qty: 60 | Fill #1

## 2016-06-15 ENCOUNTER — Ambulatory Visit: Payer: Self-pay | Admitting: Family Medicine

## 2016-07-07 MED FILL — clonazePAM 0.5 MG TABS: 0.5 | 30 days supply | Qty: 60 | Fill #2

## 2016-07-13 ENCOUNTER — Other Ambulatory Visit: Payer: Self-pay

## 2016-07-13 NOTE — Progress Notes (Signed)
Subjective:    CC: Peripheral neuropathy follow up low testosterone.    Patient is following up for his peripheral neuropathy. Patient was started on B12 injections and was found to have a low testosterone.  Patient increase his testosterone injections. Patient did have labs 01/12/2016 showing the patient testosterone did go lowered to 348   Patient is having more fatigued and feels that patient is actually more depressed.. Patient thinks that we are having more of a depression. Patient is wondering if other vacation to be beneficial. Doing more Wellbutrin but not as much of the serotonin syndrome. Patient is a given another medication could be more beneficial. Denies any suicidal or homicidal ideation. Feels that also his peripheral neuropathy seems to be worsening. Tries to be running that when he does that it seems to get worse as well. Worsening orthotics regularly.  DHEA 577<--281 Cortisol 7.9 Lab Results  Component Value Date   TESTOSTERONE 648 03/18/2016     Past Medical History:  Diagnosis Date  . Allergic rhinitis, cause unspecified   . Depressive disorder, not elsewhere classified   . Disturbance of skin sensation   . Esophageal reflux   . lung ca dx'd 09/2009   surg only  . Lymphocytic colitis   . Other abnormal blood chemistry   . Other diseases of lung, not elsewhere classified   . Other testicular hypofunction   . Personal history of urinary calculi    Past Surgical History:  Procedure Laterality Date  . 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 parotid tumor surgery    . TONSILLECTOMY AND ADENOIDECTOMY     Social History  Substance Use Topics  . Smoking status: Never Smoker  . Smokeless tobacco: Never Used  . Alcohol use Yes     Comment: rare occ   Allergies  Allergen Reactions  . Bee Venom Anaphylaxis  . Flagyl [Metronidazole Hcl]   . Nsaids     REACTION: intolereance   Family History  Problem Relation Age  of Onset  . Hypertension Mother   . Anxiety disorder Mother   . Non-Hodgkin's lymphoma Mother   . Dementia Father   . Depression Father     and anxiety  . Hypertension Father   . Early death Neg Hx   . Heart disease Neg Hx     mother in 33s had lesoin     Past medical history, social, surgical and family history all reviewed and no pertinent info pertaining to chief complaint. All other was reviewed using the EMR.    Review of Systems: No headache, visual changes, nausea, vomiting, diarrhea, constipation, dizziness, abdominal pain, skin rash, fevers, chills, night sweats, weight loss, swollen lymph nodes,  chest pain, shortness of breath, Positive mood changes, muscle aches, peripheral neuropathy  Objective:   Blood pressure 126/84, pulse 73, resp. rate 16, weight 188 lb 6 oz (85.4 kg), SpO2 98 %.  Review of Systems: No headache, visual changes, nausea, vomiting, diarrhea, constipation, dizziness, abdominal pain, skin rash, fevers, chills, night sweats, weight loss, swollen lymph nodes, body aches, joint swelling, muscle aches, chest pain, shortness of breath, mood changes.      Back Exam:  Inspection: Unremarkable  Motion: Flexion 40 deg, Extension 25 deg, Side Bending to 30 deg bilaterally,  Rotation to 30 deg bilaterally  SLR laying: Negative  XSLR laying: Negative  Palpable tenderness: Tender to palpation in the paraspinal musculature of the lumbar spine laterally. New from previous exam FABER: negative  for continued tightness Sensory change: Gross sensation intact to all lumbar and sacral dermatomes.  Reflexes: 2+ at both patellar tendons, 2+ at achilles tendons, Babinski's downgoing.  Strength at foot  Plantar-flexion: 5/5 Dorsi-flexion: 5/5 Eversion: 5/5 Inversion: 5/5  Leg strength  Quad: 5/5 Hamstring: 5/5 Hip flexor: 5/5 Hip abductors: 4 /5 80 somewhat weaker. Gait unremarkable.   Impression and Recommendations:

## 2016-07-14 ENCOUNTER — Encounter: Payer: Self-pay | Admitting: Family Medicine

## 2016-07-14 ENCOUNTER — Ambulatory Visit (INDEPENDENT_AMBULATORY_CARE_PROVIDER_SITE_OTHER): Payer: 59 | Admitting: Family Medicine

## 2016-07-14 DIAGNOSIS — F411 Generalized anxiety disorder: Secondary | ICD-10-CM | POA: Diagnosis not present

## 2016-07-14 DIAGNOSIS — R7989 Other specified abnormal findings of blood chemistry: Secondary | ICD-10-CM

## 2016-07-14 DIAGNOSIS — E349 Endocrine disorder, unspecified: Secondary | ICD-10-CM | POA: Diagnosis not present

## 2016-07-14 MED ORDER — BUPROPION HCL ER (XL) 150 MG PO TB24: 150.0000 mg | ORAL_TABLET | ORAL | 0 refills | Status: DC

## 2016-07-14 MED FILL — BUPROPION XL 150 MG TAB: 150 | 90 days supply | Qty: 90 | Fill #0

## 2016-07-14 NOTE — Progress Notes (Signed)
Pre-visit discussion using our clinic review tool. No additional management support is needed unless otherwise documented below in the visit note.  

## 2016-07-14 NOTE — Patient Instructions (Signed)
Go to see you  Viibryd 10 mg daily for 1 week then 20 mg daily thereafter.  Change wellbutrin to 150  DHEA 50 mg daily for 1 month then 2 weeks off.  Keep doing everything else but look into biking Look into [place] training.  Also weight lifting 2 times a week and cardio 2 times a week with one day of stretching See me again in 4 weeks maybe.

## 2016-07-14 NOTE — Assessment & Plan Note (Signed)
Continue same dose at this time. May need to recheck testosterone next 2 months.

## 2016-07-14 NOTE — Assessment & Plan Note (Signed)
Believe the patient is having more difficulty with his generalized anxiety as well as some underlying depression. Patient did do a lot of research and we will start viibyrd. Decrease patient's Wellbutrin to 150 mg. Patient try this medication before we do a prescription dose. Patient will follow-up with me again in 2-4 weeks. Patient will also be following up with primary care provider.  Spent  25 minutes with patient face-to-face and had greater than 50% of counseling including as described above in assessment and plan.

## 2016-07-16 ENCOUNTER — Other Ambulatory Visit: Payer: Self-pay | Admitting: Family Medicine

## 2016-07-19 MED FILL — TESTOSTERONE CYP 200 MG/ML: 200 | 84 days supply | Qty: 6 | Fill #0

## 2016-07-19 NOTE — Telephone Encounter (Signed)
Rx faxed to [facility]

## 2016-07-19 NOTE — Telephone Encounter (Signed)
Testosterone Cypionate  Last Refilled 12/15/2015 Last OV: 07/14/2016 Next appt 08/11/2016 Please advise

## 2016-07-27 ENCOUNTER — Encounter: Payer: Self-pay | Admitting: Family Medicine

## 2016-08-04 MED FILL — clonazePAM 0.5 MG TABS: 0.5 | 30 days supply | Qty: 60 | Fill #3

## 2016-08-10 NOTE — Progress Notes (Signed)
Subjective:    CC: Peripheral neuropathy follow up low testosterone.    Patient is following up for his peripheral neuropathy. Patient was started on B12 injections and was found to have a low testosterone.  We have corrected both of these. Patient unfortunately is having worsening symptoms. Patient is actually having worsening balance he thinks as well. Concerned about this overall. 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 Think that that can be plain a role.  Patient is also having what he feels palpitations. Was seen in 1995 by cardiology and did have a workup that was only remarkable for PACs. Patient brings in a stripped from a over-the-counter EKG machine showing patient does have bigeminy. Patient states that then they have gotten worse since we started him on Vibbryd. Did state that he was helping his anxiety though. Patient though does not think it is worth him having palpitations. Patient has titrate himself off the medication. Feels that his anxiety as well controlled overall with him continuing the Wellbutrin 300 mg.  DHEA 577<--281 Cortisol 7.9 Lab Results  Component Value Date   TESTOSTERONE 648 03/18/2016     Past Medical History:  Diagnosis Date  . Allergic rhinitis, cause unspecified   . Depressive disorder, not elsewhere classified   . Disturbance of skin sensation   . Esophageal reflux   . lung ca dx'd 09/2009   surg only  . Lymphocytic colitis   . Other abnormal blood chemistry   . Other diseases of lung, not elsewhere classified   . Other testicular hypofunction   . Personal history of urinary calculi    Past Surgical History:  Procedure Laterality Date  . 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 parotid tumor surgery    . TONSILLECTOMY AND ADENOIDECTOMY     Social History  Substance Use Topics  . Smoking status: Never Smoker  . Smokeless tobacco: Never Used  . Alcohol use Yes     Comment: rare occ    Allergies  Allergen Reactions  . Bee Venom Anaphylaxis  . Flagyl [Metronidazole Hcl]   . Nsaids     REACTION: intolereance   Family History  Problem Relation Age of Onset  . Hypertension Mother   . Anxiety disorder Mother   . Non-Hodgkin's lymphoma Mother   . Dementia Father   . Depression Father     and anxiety  . Hypertension Father   . Early death Neg Hx   . Heart disease Neg Hx     mother in 85s had lesoin     Past medical history, social, surgical and family history all reviewed and no pertinent info pertaining to chief complaint. All other was reviewed using the EMR.    Review of Systems: No headache, visual changes, nausea, vomiting, diarrhea, constipation, dizziness, abdominal pain, skin rash, fevers, chills, night sweats, weight loss, swollen lymph nodes, body aches, joint swelling, chest pain, shortness of breath, Positive mood changes for anxiety and depression, denies suicidal or homicidal ideation, positive for muscle aches, peripheral neuropathy  Objective:   Blood pressure 138/76, pulse 82, resp. rate 16, weight 186 lb (84.4 kg), SpO2 97 %.  Systems examined below as of 08/11/16 General: NAD A&O x3 mood, affect normal  HEENT: Pupils equal, extraocular movements intact no nystagmus Respiratory: not short of breath at rest or with speaking Cardiovascular: No lower extremity edema, non tender Skin: Warm dry intact with no signs of infection or rash on extremities or on axial skeleton.  Abdomen: Soft nontender, no masses Neuro: Cranial nerves  intact, neurovascularly intact in all extremities The patient does have what appears to be some very small neuropathy of the dorsal aspect of the feet bilaterally with 2+ DTRs and 2+ pulses. Lymph: No lymphadenopathy appreciated today  Gait normal with good balance and coordination.  MSK: Non tender with full range of motion and good stability and symmetric strength and tone of shoulders, elbows, wrist,  knee hips and  ankles bilaterally.      Impression and Recommendations:

## 2016-08-11 ENCOUNTER — Ambulatory Visit (INDEPENDENT_AMBULATORY_CARE_PROVIDER_SITE_OTHER): Payer: 59 | Admitting: Family Medicine

## 2016-08-11 ENCOUNTER — Encounter: Payer: Self-pay | Admitting: Family Medicine

## 2016-08-11 VITALS — BP 138/76 | HR 82 | Resp 16 | Wt 186.0 lb

## 2016-08-11 DIAGNOSIS — G609 Hereditary and idiopathic neuropathy, unspecified: Secondary | ICD-10-CM

## 2016-08-11 DIAGNOSIS — F329 Major depressive disorder, single episode, unspecified: Secondary | ICD-10-CM | POA: Diagnosis not present

## 2016-08-11 DIAGNOSIS — R002 Palpitations: Secondary | ICD-10-CM

## 2016-08-11 DIAGNOSIS — F32A Depression, unspecified: Secondary | ICD-10-CM

## 2016-08-11 DIAGNOSIS — F419 Anxiety disorder, unspecified: Secondary | ICD-10-CM | POA: Diagnosis not present

## 2016-08-11 NOTE — Assessment & Plan Note (Signed)
Continue same treatment at this time.

## 2016-08-11 NOTE — Assessment & Plan Note (Signed)
Worsening symptoms at this time. Discussed with patient at great length. Patient will have another EMG done and referred to neurology for further evaluation and treatment.

## 2016-08-11 NOTE — Assessment & Plan Note (Signed)
Having difficulty with the medications at this time. We will refer patient to psychology for further evaluation and workup. Significant by the medication that can be more beneficial.

## 2016-08-11 NOTE — Assessment & Plan Note (Signed)
Patient complaining more of palpitations overall. We discussed with patient at great length. Patient feels that some of it seems to be positional. No recent illnesses. Patient has had difficulty with this over the course of 15-20 years but seems to be worsening. We'll refer to cart allergy for further evaluation and treatment.

## 2016-08-11 NOTE — Patient Instructions (Signed)
Good to see you  Cardiology  Psychology  Nuerology  They will all be getting back to you  EMG ordered as well.  Every other day on the vibbryd until you are out.  Send me a message if anything changes and keep me in the loop Otherwise see me when you need me./

## 2016-08-12 ENCOUNTER — Encounter: Payer: Self-pay | Admitting: Family Medicine

## 2016-08-17 ENCOUNTER — Encounter: Payer: Self-pay | Admitting: Neurology

## 2016-08-18 ENCOUNTER — Other Ambulatory Visit: Payer: Self-pay | Admitting: Family Medicine

## 2016-08-19 ENCOUNTER — Other Ambulatory Visit: Payer: Self-pay

## 2016-08-19 MED ORDER — HYDROCODONE-ACETAMINOPHEN 5-325 MG PO TABS: 0.5000 | ORAL_TABLET | Freq: Two times a day (BID) | ORAL | 0 refills | Status: DC | PRN

## 2016-08-31 ENCOUNTER — Ambulatory Visit (INDEPENDENT_AMBULATORY_CARE_PROVIDER_SITE_OTHER): Payer: 59 | Admitting: Family Medicine

## 2016-08-31 ENCOUNTER — Encounter: Payer: Self-pay | Admitting: Family Medicine

## 2016-08-31 DIAGNOSIS — G609 Hereditary and idiopathic neuropathy, unspecified: Secondary | ICD-10-CM | POA: Diagnosis not present

## 2016-08-31 DIAGNOSIS — F411 Generalized anxiety disorder: Secondary | ICD-10-CM | POA: Diagnosis not present

## 2016-08-31 DIAGNOSIS — G894 Chronic pain syndrome: Secondary | ICD-10-CM | POA: Insufficient documentation

## 2016-08-31 MED ORDER — HYDROCODONE-ACETAMINOPHEN 5-325 MG PO TABS: 0.5000 | ORAL_TABLET | Freq: Two times a day (BID) | ORAL | 0 refills | Status: DC | PRN

## 2016-08-31 MED ORDER — CLONAZEPAM 0.5 MG PO TABS: 0.5000 mg | ORAL_TABLET | Freq: Two times a day (BID) | ORAL | 5 refills | Status: DC | PRN

## 2016-08-31 MED FILL — HYDROCODON-APAP 5-325: 5-325 | 78 days supply | Qty: 45 | Fill #0

## 2016-08-31 NOTE — Assessment & Plan Note (Signed)
S: patient with history of idoopathic peropheral neuropathy. Unclear etiology with normal EMG in past. At baseline he is on gabapentin 300 to 600mg  at night. cymbalta has been suggested but he is not taking.   Prior trials topical magnesium and voltaren. From my recollection has not tolerated higher doses of gabapentin due to fatigue/sleepiness.   He also takes sparing vicodin for more severe times of pain.    Continue gabapentin. He tells me he plans to see [place] neurology soon which I think is very reasonable.   A/P:We went over and signed a controlled substance contract today for his vicodin. He has reported taking this 1-2x a week. Last fill was 04/29/16 which matches this. I am creating this as a new problem of chronic pain in problem list for easier accesibility  Reviewed [place]- Last fills of vicodin were 04/29/16 and 12/12/15 as well as may 2017 (about 3x a year). only fills on klonopin have been through our office/me about once a month.  Discussed with our upcoming policy will need UDS once a year likely. Also see anxiety for discussion on combination with klonopin.

## 2016-08-31 NOTE — Assessment & Plan Note (Signed)
S: patient with history of idoopathic peropheral neuropathy. Unclear etiology with normal EMG in past. At baseline he is on gabapentin 300 to 600mg  at night. cymbalta has been suggested but he is not taking.   Prior trials topical magnesium and voltaren. From my recollection has not tolerated higher doses of gabapentin due to fatigue/sleepiness.   He also takes sparing vicodin for more severe times of pain.    Continue gabapentin. He tells me he plans to see [place] neurology soon which I think is very reasonable.   A/P:We went over and signed a controlled substance contract today for his vicodin. He has reported taking this 1-2x a week. Last fill was 04/29/16 which matches this. I am creating a new problem of chronic pain in problem list for easier accesibility  Reviewed [place]- Last fills of vicodin were 04/29/16 and 12/12/15 as well as may 2017 (about 3x a year). only fills on klonopin have been through our office/me about once a month.  Discussed with our upcoming policy will need UDS once a year likely. Also see anxiety for discussion on combination with klonopin.

## 2016-08-31 NOTE — Patient Instructions (Signed)
You are doing about 3 fills a year on the vicodin (q4 months). Since our new policy will require in office visits for this- I increased your # to 45 to hopefully allow twice a year visit  Hopeful klonopin use can be decreased with psychology visits- may need to see psychiatry down the road  (will hold off on this given # of other visits you have upcoming) as I do not prescribe benzodiazepines for daily use long term.

## 2016-08-31 NOTE — Progress Notes (Signed)
Subjective:  [name] Page is a 66 y.o. year old very pleasant male patient who presents for/with See problem oriented charting ROS- continued pain into bilateral legs, admits to anxiety. No chest pain or shortness of breath.    Past Medical History-  Patient Active Problem List   Diagnosis Date Noted  . Chronic pain syndrome 08/31/2016    Priority: High  . Lung cancer ([place]) 12/11/2012    Priority: High  . Generalized anxiety disorder 03/12/2014    Priority: Medium  . Essential hypertension 03/12/2014    Priority: Medium  . Hereditary and idiopathic peripheral neuropathy 12/22/2012    Priority: Medium  . Dyspnea 07/14/2012    Priority: Medium  . Hyperlipidemia 07/14/2012    Priority: Medium  . Depression 02/16/2010    Priority: Medium  . Low testosterone 11/21/2007    Priority: Medium  . Eustachian tube dysfunction 12/17/2014    Priority: Low  . Nonspecific abnormal electrocardiogram (ECG) (EKG) 07/14/2012    Priority: Low  . Allergic reaction, history of 10/25/2011    Priority: Low  . Rectal or anal pain 10/01/2010    Priority: Low  . Allergic rhinitis 06/13/2007    Priority: Low  . GERD 06/13/2007    Priority: Low  . IBS (irritable bowel syndrome) 06/13/2007    Priority: Low  . Palpitations 08/11/2016  . 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 Peroneal tendonitis of right lower extremity 05/08/2014  . SI (sacroiliac) joint dysfunction 09/19/2013  . Nonallopathic lesion of sacral region 09/19/2013  . Nonallopathic lesion of thoracic region 09/19/2013  . Nonallopathic lesion of lumbosacral region 09/19/2013  . Right foot pain 12/22/2012    Medications- reviewed and updated Current Outpatient Prescriptions  Medication Sig Dispense Refill  . amLODipine (NORVASC) 5 MG tablet Take 1 tablet (5 mg total) by mouth daily. 90 tablet 3  . aspirin 81 MG tablet Take 81 mg by mouth daily.    Marland Kitchen b complex vitamins tablet Take 1 tablet by mouth daily.      Marland Kitchen buPROPion  (WELLBUTRIN XL) 300 MG 24 hr tablet   3  . Cholecalciferol (VITAMIN D) 2000 UNITS CAPS Take 1 capsule by mouth daily.     . clonazePAM (KLONOPIN) 0.5 MG tablet TAKE 1 TABLET BY MOUTH TWICE DAILY AS NEEDED 60 tablet 3  . Coenzyme Q10 (CO Q 10 PO) Take 1 capsule by mouth daily.    . cyanocobalamin (,VITAMIN B-12,) 1000 MCG/ML injection INJECT 1 ML (1,000 MCG TOTAL) INTO THE MUSCLE EVERY 30 DAYS. WEEKLY 30 mL 1  . DHEA 25 MG CAPS Take 1 capsule by mouth.    . EPINEPHrine (EPIPEN 2-PAK) 0.3 mg/0.3 mL IJ SOAJ injection USE AS NEEDED 2 Device 1  . fluticasone (FLONASE) 50 MCG/ACT nasal spray Place 2 sprays into both nostrils daily. 16 g 6  . gabapentin (NEURONTIN) 300 MG capsule TAKE 1 TO 2 CAPSULES BY MOUTH AT BEDTIME 180 capsule 1  . HYDROcodone-acetaminophen (NORCO/VICODIN) 5-325 MG tablet Take 0.5-1 tablets by mouth 2 (two) times daily as needed (No more than 1-2x a week use.). 30 tablet 0  . Insulin Pen Needle 29G X 12.7NT MISC 1 application by Does not apply route daily. 100 each 3  . MAGNESIUM PO Take 1 capsule by mouth daily.    Marland Kitchen NEEDLE, DISP, 23 G 23G X 1-1/2" MISC Use as directed 100 each 3  . Omega-3 Fatty Acids (FISH OIL) 1000 MG CAPS Take 1 capsule by mouth 2 (two) times daily.     Marland Kitchen  Saw Palmetto, Serenoa repens, (SAW PALMETTO PO) Take 1 capsule by mouth 2 (two) times daily.    . SYRINGE-NEEDLE, DISP, 3 ML 22G X 1-1/2" 3 ML MISC Use as directed. 100 each 3  . testosterone cypionate (DEPOTESTOSTERONE CYPIONATE) 200 MG/ML injection INJECT 1 ML INTO THE MUSCLE EVERY 14 DAYS 10 mL 5   No current facility-administered medications for this visit.     Objective: BP 134/62 (BP Location: Left Arm, Patient Position: Sitting, Cuff Size: Large)   Pulse 82   Temp 97.6 F (36.4 C) (Oral)   Ht 5' 7.25" (1.708 m)   Wt 182 lb 3.2 oz (82.6 kg)   SpO2 94%   BMI 28.32 kg/m  Gen: NAD, resting comfortably  Assessment/Plan:  Hereditary and idiopathic peripheral neuropathy S: patient with history  of idoopathic peropheral neuropathy. Unclear etiology with normal EMG in past. At baseline he is on gabapentin 300 to 600mg  at night. cymbalta has been suggested but he is not taking.   Prior trials topical magnesium and voltaren. From my recollection has not tolerated higher doses of gabapentin due to fatigue/sleepiness.   He also takes sparing vicodin for more severe times of pain.    Continue gabapentin. He tells me he plans to see [place] [facility] neurology soon which I think is very reasonable.   A/P:We went over and signed a controlled substance contract today for his vicodin. He has reported taking this 1-2x a week. Last fill was 04/29/16 which matches this. I am creating a new problem of chronic pain in problem list for easier accesibility  Reviewed [place]- Last fills of vicodin were 04/29/16 and 12/12/15 as well as may 2017 (about 3x a year). only fills on klonopin have been through our office/me about once a month.  Discussed with our upcoming policy will need UDS once a year likely. Also see anxiety for discussion on combination with klonopin.   Generalized anxiety disorder We also went over the risks of combining benzodiazepines with narcotics. He uses about 60 clonazepam 0.5mg  every month. History of panic attacks. Medicines have been twaked with Dr. Tamala Julian but has not found a good combination yet. I recommended psychiatry visit with high risk medication combination. On wellbutrin 300mg - trial vybriid and had bigeminy is to see cardiology. Will see behavioral health. Will be seeing neurology at [facility] in august for consideration repeat EMG.   refills as below- should be adequate for 6 months  Advised CPE September or later- last 12/10/15.   Meds ordered this encounter  Medications  . HYDROcodone-acetaminophen (NORCO/VICODIN) 5-325 MG tablet    Sig: Take 0.5-1 tablets by mouth 2 (two) times daily as needed (No more than 1-2x a week use.).    Dispense:  45 tablet    Refill:  0  . clonazePAM  (KLONOPIN) 0.5 MG tablet    Sig: Take 1 tablet (0.5 mg total) by mouth 2 (two) times daily as needed. For anxiety    Dispense:  60 tablet    Refill:  5   The duration of face-to-face time during this visit was greater than 15 minutes. Greater than 50% of this time was spent in counseling, explanation of diagnosis, planning of further management, and/or coordination of care including discussing our upcoming narcotics policy, stop act, reasoning behind each step of process.     Return precautions advised.  [name], MD

## 2016-08-31 NOTE — Assessment & Plan Note (Signed)
We also went over the risks of combining benzodiazepines with narcotics. He uses about 60 clonazepam 0.5mg  every month. History of panic attacks. Medicines have been twaked with Dr. Tamala Julian but has not found a good combination yet. I recommended psychiatry visit with high risk medication combination. On wellbutrin 300mg - trial vybriid and had bigeminy is to see cardiology. Will see behavioral health. Will be seeing neurology at [facility] in august for consideration repeat EMG.

## 2016-09-01 ENCOUNTER — Other Ambulatory Visit: Payer: Self-pay | Admitting: *Deleted

## 2016-09-01 ENCOUNTER — Encounter: Payer: Self-pay | Admitting: Family Medicine

## 2016-09-01 ENCOUNTER — Other Ambulatory Visit: Payer: Self-pay | Admitting: Family Medicine

## 2016-09-01 DIAGNOSIS — R200 Anesthesia of skin: Secondary | ICD-10-CM

## 2016-09-01 MED FILL — clonazePAM 0.5 MG TABS: 0.5 | 30 days supply | Qty: 60 | Fill #0

## 2016-09-01 MED FILL — AMLODIPINE BESYLATE 5 MG TA: 5 | 90 days supply | Qty: 90 | Fill #3

## 2016-09-01 MED FILL — BUPROPION HCL XL 300 MG TAB: 300 | 90 days supply | Qty: 90 | Fill #0

## 2016-09-02 ENCOUNTER — Ambulatory Visit (INDEPENDENT_AMBULATORY_CARE_PROVIDER_SITE_OTHER): Payer: 59 | Admitting: Neurology

## 2016-09-02 DIAGNOSIS — G629 Polyneuropathy, unspecified: Secondary | ICD-10-CM

## 2016-09-02 DIAGNOSIS — R200 Anesthesia of skin: Secondary | ICD-10-CM | POA: Diagnosis not present

## 2016-09-02 NOTE — Procedures (Signed)
[facility]  [place], [place]  [place], [place] [address] Tel: [phone] Fax:  [phone] Test Date:  09/02/2016  Patient: [name] Page DOB: 09/02/1950 Physician: Narda Amber, DO  Sex: Male Height: 5\' 7"  Ref Phys: Hulan Saas, D.O.  ID#: [id number] Temp: 35.5C Technician:    Patient Complaints: This is a 66 year-old gentleman referred for evaluation of bilateral feet paresthesias.  NCV & EMG Findings: Extensive electrodiagnostic testing of the right lower extremity and additional studies of the left shows:  1. Right superficial peroneal and left sural sensory responses show mildly reduced amplitude. Right sural and left superficial peroneal sensory responses are within normal limits. 2. Bilateral peroneal and tibial motor responses are within normal limits. 3. Bilateral tibial H reflex studies are mildly prolonged. 4. Chronic motor axon loss changes are seen affecting bilateral tibialis anterior and flexor digitorum longus muscles. There is no evidence of accompanied active denervation.  Impression: The electrophysiologic findings are most consistent with a distal and symmetric sensorimotor polyneuropathy, axon loss in type, affecting the lower extremities.  Overall, these findings are mild in degree electrically.   ___________________________ Narda Amber, DO    Nerve Conduction Studies Anti Sensory Summary Table   Site NR Peak (ms) Norm Peak (ms) P-T Amp (V) Norm P-T Amp  Left Sup Peroneal Anti Sensory (Ant Lat Mall)  35.5C  12 cm    3.4 <4.6 3.9 >3  Right Sup Peroneal Anti Sensory (Ant Lat Mall)  35.5C  12 cm    3.1 <4.6 2.7 >3  Left Sural Anti Sensory (Lat Mall)  35.5C  Calf    4.5 <4.6 2.9 >3  Right Sural Anti Sensory (Lat Mall)  35.5C  Calf    3.9 <4.6 3.4 >3   Motor Summary Table   Site NR Onset (ms) Norm Onset (ms) O-P Amp (mV) Norm O-P Amp Site1 Site2 Delta-0 (ms) Dist (cm) Vel (m/s) Norm Vel (m/s)  Left Peroneal Motor (Ext Dig  Brev)  35.5C  Ankle    5.3 <6.0 3.5 >2.5 B Fib Ankle 8.2 35.0 43 >40  B Fib    13.5  2.8  Poplt B Fib 2.2 10.0 45 >40  Poplt    15.7  2.6         Right Peroneal Motor (Ext Dig Brev)  35.5C  Ankle    4.3 <6.0 4.5 >2.5 B Fib Ankle 8.2 38.0 46 >40  B Fib    12.5  4.0  Poplt B Fib 1.6 9.0 56 >40  Poplt    14.1  3.8         Left Tibial Motor (Abd Hall Brev)  35.5C  Ankle    6.0 <6.0 7.5 >4 Knee Ankle 10.5 39.0 40 >40  Knee    16.5  4.0         Right Tibial Motor (Abd Hall Brev)  35.5C  Ankle    3.8 <6.0 6.5 >4 Knee Ankle 9.0 38.0 42 >40  Knee    12.8  5.4          H Reflex Studies   NR H-Lat (ms) Lat Norm (ms) L-R H-Lat (ms)  Left Tibial (Gastroc)  35.5C     38.23 <35 0.00  Right Tibial (Gastroc)  35.5C     38.23 <35 0.00   EMG   Side Muscle Ins Act Fibs Psw Fasc Number Recrt Dur Dur. Amp Amp. Poly Poly. Comment  Right AntTibialis Nml Nml Nml Nml 1- Rapid Few 1+ Few  1+ Nml Nml N/A  Right Gastroc Nml Nml Nml Nml Nml Nml Nml Nml Nml Nml Nml Nml N/A  Right Flex Dig Long Nml Nml Nml Nml 1- Rapid Some 1+ Some 1+ Nml Nml N/A  Right RectFemoris Nml Nml Nml Nml Nml Nml Nml Nml Nml Nml Nml Nml N/A  Right GluteusMed Nml Nml Nml Nml Nml Nml Nml Nml Nml Nml Nml Nml N/A  Left BicepsFemS Nml Nml Nml Nml Nml Nml Nml Nml Nml Nml Nml Nml N/A  Right BicepsFemS Nml Nml Nml Nml Nml Nml Nml Nml Nml Nml Nml Nml N/A  Left AntTibialis Nml Nml Nml Nml 1- Rapid Some 1+ Some 1+ Nml Nml N/A  Left Gastroc Nml Nml Nml Nml Nml Nml Nml Nml Nml Nml Nml Nml N/A  Left Flex Dig Long Nml Nml Nml Nml 1- Rapid Some 1+ Some 1+ Nml Nml N/A  Left RectFemoris Nml Nml Nml Nml Nml Nml Nml Nml Nml Nml Nml Nml N/A  Left GluteusMed Nml Nml Nml Nml Nml Nml Nml Nml Nml Nml Nml Nml N/A      Waveforms:

## 2016-09-07 ENCOUNTER — Ambulatory Visit (INDEPENDENT_AMBULATORY_CARE_PROVIDER_SITE_OTHER): Payer: 59

## 2016-09-07 DIAGNOSIS — Z23 Encounter for immunization: Secondary | ICD-10-CM

## 2016-09-09 ENCOUNTER — Ambulatory Visit (INDEPENDENT_AMBULATORY_CARE_PROVIDER_SITE_OTHER): Payer: 59 | Admitting: Family Medicine

## 2016-09-09 ENCOUNTER — Encounter: Payer: Self-pay | Admitting: Family Medicine

## 2016-09-09 VITALS — BP 130/62 | HR 78 | Temp 97.7°F | Ht 67.25 in | Wt 182.0 lb

## 2016-09-09 DIAGNOSIS — L03114 Cellulitis of left upper limb: Secondary | ICD-10-CM | POA: Diagnosis not present

## 2016-09-09 MED ORDER — CEPHALEXIN 500 MG PO CAPS: 500.0000 mg | ORAL_CAPSULE | Freq: Three times a day (TID) | ORAL | 0 refills | Status: DC

## 2016-09-09 NOTE — Progress Notes (Signed)
Subjective:  [name] Page is a 66 y.o. year old very pleasant male patient who presents for/with See problem oriented charting ROS- ROS-no fevers, chills, fatigue/malaise, nausea/vomiting, or recent weight change   Past Medical History-  Patient Active Problem List   Diagnosis Date Noted  . Chronic pain syndrome 08/31/2016    Priority: High  . Lung cancer ([place] [facility]) 12/11/2012    Priority: High  . Generalized anxiety disorder 03/12/2014    Priority: Medium  . Essential hypertension 03/12/2014    Priority: Medium  . Hereditary and idiopathic peripheral neuropathy 12/22/2012    Priority: Medium  . Dyspnea 07/14/2012    Priority: Medium  . Hyperlipidemia 07/14/2012    Priority: Medium  . Depression 02/16/2010    Priority: Medium  . Low testosterone 11/21/2007    Priority: Medium  . Eustachian tube dysfunction 12/17/2014    Priority: Low  . Nonspecific abnormal electrocardiogram (ECG) (EKG) 07/14/2012    Priority: Low  . Allergic reaction, history of 10/25/2011    Priority: Low  . Rectal or anal pain 10/01/2010    Priority: Low  . Allergic rhinitis 06/13/2007    Priority: Low  . GERD 06/13/2007    Priority: Low  . IBS (irritable bowel syndrome) 06/13/2007    Priority: Low  . Palpitations 08/11/2016  . 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 Peroneal tendonitis of right lower extremity 05/08/2014  . SI (sacroiliac) joint dysfunction 09/19/2013  . Nonallopathic lesion of sacral region 09/19/2013  . Nonallopathic lesion of thoracic region 09/19/2013  . Nonallopathic lesion of lumbosacral region 09/19/2013  . Right foot pain 12/22/2012    Medications- reviewed and updated Current Outpatient Prescriptions  Medication Sig Dispense Refill  . amLODipine (NORVASC) 5 MG tablet Take 1 tablet (5 mg total) by mouth daily. 90 tablet 3  . aspirin 81 MG tablet Take 81 mg by mouth daily.    Marland Kitchen b complex vitamins tablet Take 1 tablet by mouth daily.      Marland Kitchen buPROPion (WELLBUTRIN XL) 300  MG 24 hr tablet   3  . buPROPion (WELLBUTRIN XL) 300 MG 24 hr tablet TAKE 1 TABLET BY MOUTH EVERY DAY 90 tablet 3  . Cholecalciferol (VITAMIN D) 2000 UNITS CAPS Take 1 capsule by mouth daily.     . clonazePAM (KLONOPIN) 0.5 MG tablet Take 1 tablet (0.5 mg total) by mouth 2 (two) times daily as needed. For anxiety 60 tablet 5  . Coenzyme Q10 (CO Q 10 PO) Take 1 capsule by mouth daily.    . cyanocobalamin (,VITAMIN B-12,) 1000 MCG/ML injection INJECT 1 ML (1,000 MCG TOTAL) INTO THE MUSCLE EVERY 30 DAYS. WEEKLY 30 mL 1  . DHEA 25 MG CAPS Take 1 capsule by mouth.    . EPINEPHrine (EPIPEN 2-PAK) 0.3 mg/0.3 mL IJ SOAJ injection USE AS NEEDED 2 Device 1  . fluticasone (FLONASE) 50 MCG/ACT nasal spray Place 2 sprays into both nostrils daily. 16 g 6  . gabapentin (NEURONTIN) 300 MG capsule TAKE 1 TO 2 CAPSULES BY MOUTH AT BEDTIME 180 capsule 1  . HYDROcodone-acetaminophen (NORCO/VICODIN) 5-325 MG tablet Take 0.5-1 tablets by mouth 2 (two) times daily as needed (No more than 1-2x a week use.). 45 tablet 0  . Insulin Pen Needle 29G X 11.9JY MISC 1 application by Does not apply route daily. 100 each 3  . MAGNESIUM PO Take 1 capsule by mouth daily.    Marland Kitchen NEEDLE, DISP, 23 G 23G X 1-1/2" MISC Use as directed 100 each 3  .  Omega-3 Fatty Acids (FISH OIL) 1000 MG CAPS Take 1 capsule by mouth 2 (two) times daily.     . Saw Palmetto, Serenoa repens, (SAW PALMETTO PO) Take 1 capsule by mouth 2 (two) times daily.    . SYRINGE-NEEDLE, DISP, 3 ML 22G X 1-1/2" 3 ML MISC Use as directed. 100 each 3  . testosterone cypionate (DEPOTESTOSTERONE CYPIONATE) 200 MG/ML injection INJECT 1 ML INTO THE MUSCLE EVERY 14 DAYS 10 mL 5   No current facility-administered medications for this visit.     Objective: BP 130/62 (BP Location: Left Arm, Patient Position: Sitting, Cuff Size: Large)   Pulse 78   Temp 97.7 F (36.5 C) (Oral)   Ht 5' 7.25" (1.708 m)   Wt 182 lb (82.6 kg)   SpO2 95%   BMI 28.29 kg/m  Gen: NAD, resting  comfortably CV: regular rate Lungs: nonlabored Ext.skin: no edema pretibial Left arm with some bruising near head of biceps and down into lower upper arm. Also patch of erythema, warmth, tenderness in this area Neuro: intact distal sensation  Assessment/Plan:  No diagnosis found. S: received prevnar 13 in left shoulder 2 days ago. Noted burising and tenderness into upper arm down to lower portion of upper arm as well into axilla. That improved slightly then noted redness in similar distribution. That redness is expanding, is slightly warm, is tender. Icing helps minimally A/P: could be local reaction but with expanding nature- possible cellulitis. Will cover with keflex. 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 We discussed if fails treatment after 24-48 hours needs likely change in antibiotic. 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   Meds ordered this encounter  Medications  . cephALEXin (KEFLEX) 500 MG capsule    Sig: Take 1 capsule (500 mg total) by mouth 3 (three) times daily.    Dispense:  21 capsule    Refill:  0    Return precautions advised.  [name], MD

## 2016-09-09 NOTE — Patient Instructions (Signed)
Possible cellulitis after injection  Start keflex if area continues to worsen. If worsening 24-48 hours after antibiotic we may have to switch to something else.

## 2016-09-14 ENCOUNTER — Encounter: Payer: Self-pay | Admitting: Cardiology

## 2016-09-14 NOTE — Progress Notes (Signed)
Cardiology Office Note   Date:  09/16/2016   ID:  SMITH POTENZA, DOB 23-Aug-1950, MRN [id number]  PCP:  Marin Olp, MD  Cardiologist:   Minus Breeding, MD  Referring:  Self  Chief Complaint  Patient presents with  . Palpitations      History of Present Illness: [name] Page is a 66 y.o. male who presents for evaluation of palpitations.  He's been noted recently to have bigeminy on a home monitor that he uses. He's had a history of PACs and PVCs past. Over time he see my partners. He had cardiac cath several years ago that was normal. In the 90s he had a stress test which was normal. Most recently a stress perfusion study with a normal ejection fraction and no ischemia in 2014. He said 2 months of palpitations of been getting worse. They happen with sitting or lying on his left side. They last for 30 to 60 seconds.   He doesn't have any chest discomfort. He tries to deep breathe to get them to go away. He can't bring them on with activities. He can do stationary bicycle without bringing them on. He does state happening frequently than every day. He's not had chest pressure, neck or arm discomfort. He's had no new shortness of breath, PND or orthopnea. He's had no weight gain or edema.   Past Medical History:  Diagnosis Date  . Allergic rhinitis, cause unspecified   . 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 Depressive disorder, not elsewhere classified   . Esophageal reflux   . HTN (hypertension)   . Lymphocytic colitis   . Neuropathy   . Other testicular hypofunction   . Personal history of urinary calculi     Past Surgical History:  Procedure Laterality Date  . 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 parotid tumor surgery    . TONSILLECTOMY AND ADENOIDECTOMY       Current Outpatient Prescriptions  Medication Sig Dispense Refill  . aspirin 81 MG tablet Take 81 mg by mouth daily.    Marland Kitchen b complex vitamins tablet  Take 1 tablet by mouth daily.      Marland Kitchen buPROPion (WELLBUTRIN XL) 300 MG 24 hr tablet   3  . buPROPion (WELLBUTRIN XL) 300 MG 24 hr tablet TAKE 1 TABLET BY MOUTH EVERY DAY 90 tablet 3  . cephALEXin (KEFLEX) 500 MG capsule Take 1 capsule (500 mg total) by mouth 3 (three) times daily. 21 capsule 0  . Cholecalciferol (VITAMIN D) 2000 UNITS CAPS Take 1 capsule by mouth daily.     . clonazePAM (KLONOPIN) 0.5 MG tablet Take 1 tablet (0.5 mg total) by mouth 2 (two) times daily as needed. For anxiety 60 tablet 5  . Coenzyme Q10 (CO Q 10 PO) Take 1 capsule by mouth daily.    [name], [name], (DAMIANA LEAF PO) Take 700 mg by mouth daily.    Marland Kitchen DHEA 25 MG CAPS Take 1 capsule by mouth.    . EPINEPHrine (EPIPEN 2-PAK) 0.3 mg/0.3 mL IJ SOAJ injection USE AS NEEDED 2 Device 1  . gabapentin (NEURONTIN) 300 MG capsule TAKE 1 TO 2 CAPSULES BY MOUTH AT BEDTIME 180 capsule 1  . HYDROcodone-acetaminophen (NORCO/VICODIN) 5-325 MG tablet Take 0.5-1 tablets by mouth 2 (two) times daily as needed (No more than 1-2x a week use.). 45 tablet 0  . Insulin Pen Needle 29G X 12.7MM MISC 1  application by Does not apply route daily. 100 each 3  . MAGNESIUM PO Take 1 capsule by mouth daily.    Marland Kitchen NEEDLE, DISP, 23 G 23G X 1-1/2" MISC Use as directed 100 each 3  . Omega-3 Fatty Acids (FISH OIL) 1000 MG CAPS Take 1 capsule by mouth 2 (two) times daily.     . Saw Palmetto, Serenoa repens, (SAW PALMETTO PO) Take 1 capsule by mouth 2 (two) times daily.    . SYRINGE-NEEDLE, DISP, 3 ML 22G X 1-1/2" 3 ML MISC Use as directed. 100 each 3  . testosterone cypionate (DEPOTESTOSTERONE CYPIONATE) 200 MG/ML injection INJECT 1 ML INTO THE MUSCLE EVERY 14 DAYS 10 mL 5  . diltiazem (CARDIZEM CD) 180 MG 24 hr capsule Take 1 capsule (180 mg total) by mouth daily. 90 capsule 3   No current facility-administered medications for this visit.     Allergies:   Bee venom; Flagyl [metronidazole hcl]; and Nsaids    Social History:  The patient   reports that he has never smoked. He has never used smokeless tobacco. He reports that he drinks alcohol. He reports that he does not use drugs.   Family History:  The patient's family history includes Anxiety disorder in his mother; Dementia in his father; Depression in his father; Hypertension in his father and mother; Non-Hodgkin's lymphoma in his mother; Sudden death (age of onset: 59) in his paternal uncle.    ROS:  Please see the history of present illness.   Otherwise, review of systems are positive for neuropathy.   All other systems are reviewed and negative.    PHYSICAL EXAM: VS:  BP (!) 150/86   Pulse 84   Ht 5\' 7"  (1.702 m)   Wt 182 lb (82.6 kg)   BMI 28.51 kg/m  , BMI Body mass index is 28.51 kg/m. GENERAL:  Well appearing NECK:  No jugular venous distention, waveform within normal limits, carotid upstroke brisk and symmetric, no bruits, no thyromegaly LUNGS:  Clear to auscultation bilaterally CHEST:  Unremarkable HEART:  PMI not displaced or sustained,S1 and S2 within normal limits, no S3, no S4, no clicks, no rubs, no murmurs ABD:  Flat, positive bowel sounds normal in frequency in pitch, no bruits, no rebound, no guarding, no midline pulsatile mass, no hepatomegaly, no splenomegaly EXT:  2 plus pulses throughout, no edema, no cyanosis no clubbing   EKG:  EKG is ordered today. The ekg ordered today demonstrates sinus rhythm, rate 84, axis within normal limits, intervals within normal limits, RSR prime V1, possible left atrial enlargement.   Recent Labs: 12/10/2015: ALT 20; BUN 9; Creatinine, Ser 1.01; Hemoglobin 16.3; Platelets 232.0; Potassium 4.6; Sodium 138    Lipid Panel    Component Value Date/Time   CHOL 173 12/10/2015 0857   TRIG 87.0 12/10/2015 0857   HDL 57.80 12/10/2015 0857   CHOLHDL 3 12/10/2015 0857   VLDL 17.4 12/10/2015 0857   LDLCALC 98 12/10/2015 0857      Wt Readings from Last 3 Encounters:  09/16/16 182 lb (82.6 kg)  09/09/16 182 lb (82.6  kg)  08/31/16 182 lb 3.2 oz (82.6 kg)      Other studies Reviewed: Additional studies/ records that were reviewed today include: Old cardiac studies as above. . Review of the above records demonstrates:  Please see elsewhere in the note.     ASSESSMENT AND PLAN:  PALPITATIONS:  I do see some rhythm strips on his home device of bigeminy but I can't tell  whether this is atrial or ventricular. Regardless he's had a normal extensive cardiac workup in the past.  I don't see a recent TSH and I should get one of these. We'll going to go ahead and treat him symptomatically. Stop his Norvasc and start Cardizem 180 mg daily. He'll let me know if this helps with the symptoms.  HTN:  This will be treated in the context of treating the palpitations.     Current medicines are reviewed at length with the patient today.  The patient does not have concerns regarding medicines.  The following changes have been made:  As above  Labs/ tests ordered today include:   Orders Placed This Encounter  Procedures  . EKG 12-Lead     Disposition:   FU with me as needed.      Signed, Minus Breeding, MD  09/16/2016 6:03 PM    [facility]

## 2016-09-15 ENCOUNTER — Ambulatory Visit (INDEPENDENT_AMBULATORY_CARE_PROVIDER_SITE_OTHER): Payer: 59 | Admitting: Clinical

## 2016-09-15 DIAGNOSIS — F4010 Social phobia, unspecified: Secondary | ICD-10-CM

## 2016-09-16 ENCOUNTER — Encounter: Payer: Self-pay | Admitting: Cardiology

## 2016-09-16 ENCOUNTER — Ambulatory Visit (INDEPENDENT_AMBULATORY_CARE_PROVIDER_SITE_OTHER): Payer: 59 | Admitting: Cardiology

## 2016-09-16 VITALS — BP 150/86 | HR 84 | Ht 67.0 in | Wt 182.0 lb

## 2016-09-16 DIAGNOSIS — R002 Palpitations: Secondary | ICD-10-CM | POA: Diagnosis not present

## 2016-09-16 DIAGNOSIS — I10 Essential (primary) hypertension: Secondary | ICD-10-CM

## 2016-09-16 MED ORDER — DILTIAZEM HCL ER COATED BEADS 180 MG PO CP24
180.0000 mg | ORAL_CAPSULE | Freq: Every day | ORAL | 3 refills | Status: DC
Start: 2016-09-16 — End: 2016-09-16

## 2016-09-16 MED ORDER — DILTIAZEM HCL ER COATED BEADS 180 MG PO CP24: 180.0000 mg | ORAL_CAPSULE | Freq: Every day | ORAL | 3 refills | Status: DC

## 2016-09-16 MED FILL — CARTIA XT 180 MG CAPSULE SA: 180 | 90 days supply | Qty: 90 | Fill #0

## 2016-09-16 NOTE — Patient Instructions (Addendum)
Medication Instructions:  STOP- Amlodipine  START- Cardizem 180 mg daily  Labwork: None Ordered  Testing/Procedures: None Ordered  Follow-Up: Your physician recommends that you schedule a follow-up appointment in: As Needed   Any Other Special Instructions Will Be Listed Below (If Applicable).   If you need a refill on your cardiac medications before your next appointment, please call your pharmacy.

## 2016-09-17 ENCOUNTER — Telehealth: Payer: Self-pay | Admitting: *Deleted

## 2016-09-17 ENCOUNTER — Encounter: Payer: Self-pay | Admitting: Cardiology

## 2016-09-17 ENCOUNTER — Other Ambulatory Visit: Payer: Self-pay | Admitting: *Deleted

## 2016-09-17 DIAGNOSIS — R5383 Other fatigue: Secondary | ICD-10-CM

## 2016-09-17 DIAGNOSIS — Z79899 Other long term (current) drug therapy: Secondary | ICD-10-CM

## 2016-09-17 NOTE — Telephone Encounter (Signed)
-----   Message from Minus Breeding, MD sent at 09/16/2016  3:53 PM EDT ----- Needs TSH

## 2016-09-17 NOTE — Telephone Encounter (Signed)
TSH ordered and mail to pt. Leave message on pt voicemail

## 2016-09-21 DIAGNOSIS — R5383 Other fatigue: Secondary | ICD-10-CM | POA: Diagnosis not present

## 2016-09-21 DIAGNOSIS — Z79899 Other long term (current) drug therapy: Secondary | ICD-10-CM | POA: Diagnosis not present

## 2016-09-21 LAB — BASIC METABOLIC PANEL
BUN/Creatinine Ratio: 18 (ref 10–24)
BUN: 18 mg/dL (ref 8–27)
CO2: 21 mmol/L (ref 20–29)
CREATININE: 1.01 mg/dL (ref 0.76–1.27)
Calcium: 9.4 mg/dL (ref 8.6–10.2)
Chloride: 102 mmol/L (ref 96–106)
GFR calc Af Amer: 89 mL/min/{1.73_m2} (ref >59)
GFR calc non Af Amer: 77 mL/min/{1.73_m2} (ref >59)
GLUCOSE: 110 mg/dL — AB (ref 65–99)
POTASSIUM: 4.1 mmol/L (ref 3.5–5.2)
SODIUM: 140 mmol/L (ref 134–144)

## 2016-09-21 LAB — MAGNESIUM: MAGNESIUM: 2.1 mg/dL (ref 1.6–2.3)

## 2016-09-22 ENCOUNTER — Ambulatory Visit (INDEPENDENT_AMBULATORY_CARE_PROVIDER_SITE_OTHER): Payer: 59 | Admitting: Clinical

## 2016-09-22 DIAGNOSIS — F4010 Social phobia, unspecified: Secondary | ICD-10-CM

## 2016-09-22 LAB — TSH: TSH: 2.18 u[IU]/mL (ref 0.450–4.500)

## 2016-09-29 ENCOUNTER — Ambulatory Visit (INDEPENDENT_AMBULATORY_CARE_PROVIDER_SITE_OTHER): Payer: 59 | Admitting: Clinical

## 2016-09-29 DIAGNOSIS — F4011 Social phobia, generalized: Secondary | ICD-10-CM | POA: Diagnosis not present

## 2016-10-04 MED FILL — clonazePAM 0.5 MG TABS: 0.5 | 30 days supply | Qty: 60 | Fill #1

## 2016-10-19 ENCOUNTER — Ambulatory Visit (INDEPENDENT_AMBULATORY_CARE_PROVIDER_SITE_OTHER): Payer: 59 | Admitting: Clinical

## 2016-10-19 DIAGNOSIS — F4010 Social phobia, unspecified: Secondary | ICD-10-CM | POA: Diagnosis not present

## 2016-11-05 MED FILL — clonazePAM 0.5 MG TABS: 0.5 | 30 days supply | Qty: 60 | Fill #2

## 2016-11-08 ENCOUNTER — Encounter: Payer: Self-pay | Admitting: Neurology

## 2016-11-08 ENCOUNTER — Other Ambulatory Visit (INDEPENDENT_AMBULATORY_CARE_PROVIDER_SITE_OTHER): Payer: 59

## 2016-11-08 ENCOUNTER — Ambulatory Visit (INDEPENDENT_AMBULATORY_CARE_PROVIDER_SITE_OTHER): Payer: 59 | Admitting: Neurology

## 2016-11-08 VITALS — BP 140/78 | HR 64 | Ht 67.0 in | Wt 185.2 lb

## 2016-11-08 DIAGNOSIS — G629 Polyneuropathy, unspecified: Secondary | ICD-10-CM

## 2016-11-08 DIAGNOSIS — R413 Other amnesia: Secondary | ICD-10-CM | POA: Diagnosis not present

## 2016-11-08 LAB — VITAMIN B12: Vitamin B-12: 1227 pg/mL — ABNORMAL HIGH (ref 211–911)

## 2016-11-08 LAB — SEDIMENTATION RATE: SED RATE: 2 mm/h (ref 0–20)

## 2016-11-08 MED ORDER — NORTRIPTYLINE HCL 10 MG PO CAPS: ORAL_CAPSULE | ORAL | 5 refills | Status: DC

## 2016-11-08 MED FILL — NORTRIPTYLINE HCL 10 MG CAP: 10 | 30 days supply | Qty: 60 | Fill #0

## 2016-11-08 NOTE — Patient Instructions (Addendum)
Start nortriptyline 10mg  at bedtime for 2 week, then increase to 2 tablet at bedtime You can also try topical lidocaine Check labs  Return to clinic in 4-6 months

## 2016-11-08 NOTE — Progress Notes (Signed)
[facility] Clinic Note - Initial Visit   Date: 11/08/16  [name] Page MRN: [id number] DOB: [birth year]   Dear Dr. Tamala Julian:  Thank you for your kind referral of [name] Page for consultation of neuropathy. Although his history is well known to you, please allow [place] to reiterate it for the purpose of our medical record. The patient was accompanied to the clinic by self.   History of Present Illness: [name] Page is a 66 y.o. 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    In 2011, he was diagnosed with diverticulitis and incidentally was found to have lung tumor on imaging which was later found to be carcinoid.  To optimize him for potential surgery, he was treated for diverticulitis with metronidazole for 6 weeks. A few weeks later, he noticed abnormal sensation of the feet as if his socks were rolled under his toes.  He then also started having sharp needle sensation and achy soreness. He has tried soaking his feet in hot water baths which provides some relief.  If he has been his feet a lot, his pain is worse.  He was started on gabapentin 300-'600mg'$  at bedtime, but has difficulty with balance while on this.  He endorses suffering one fall in 2017 while getting out of the shower.  He was evaluated by Dr. Jacelyn Grip here in 2013 where NCS/EMG was normal.  Over the past few years, he has noticed greater intensity of pain in the feet and symptoms are more constant, worse at night.   He denies any weakness.  He was tried on vitamin B12 injections which did not provide any relief.  He has also tried Cymbalta and Effexor.  He was given a prescription for Lyrica but did not take this after reading potential side effects.   He has some word-finding difficulty and forgetfulness.  He has family history of Alzheimer's dementia and is concerned about his risk to develop this.  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  Out-side paper records, electronic medical record, and images have been reviewed where available and summarized as:  NCS/EMG of the legs 09/02/2016: The electrophysiologic findings are most consistent with a distal and symmetric sensorimotor polyneuropathy, axon loss in type, affecting the lower extremities.  Overall, these findings are mild in degree electrically.  Labs 06/07/2011:  Copper 85, vitamin B1 45, vitamin B12 983 Lab Results  Component Value Date   TSH 2.180 09/21/2016     MRI lumbar spine wo contrast 09/26/2009:   1.  No evidence for metastatic disease to the brain. 2.  Normal MRI of the brain. 3.  Polyps or mucous retention cyst at the floor the maxillary sinuses bilaterally.   Past Medical History:  Diagnosis Date  . Allergic rhinitis, cause unspecified   . 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 Depressive disorder, not elsewhere classified   . Esophageal reflux   . HTN (hypertension)   . Lymphocytic colitis   . Neuropathy   . Other testicular hypofunction   . Personal history of urinary calculi     Past Surgical History:  Procedure Laterality Date  . 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 parotid tumor surgery    . TONSILLECTOMY AND ADENOIDECTOMY       Medications:  Outpatient Encounter Prescriptions as of 11/08/2016  Medication Sig Note  . amLODipine (NORVASC) 5 MG tablet    . aspirin 81 MG tablet Take 81 mg by mouth daily.   Marland Kitchen b complex vitamins tablet Take 1 tablet by mouth daily.     Marland Kitchen buPROPion (WELLBUTRIN XL) 300 MG 24 hr tablet TAKE 1 TABLET BY MOUTH EVERY DAY   . Cholecalciferol (VITAMIN D) 2000 UNITS CAPS Take 1 capsule by mouth daily.    . clonazePAM (KLONOPIN) 0.5 MG tablet Take 1 tablet (0.5 mg total) by mouth 2 (two) times daily as needed. For anxiety   .  Coenzyme Q10 (CO Q 10 PO) Take 1 capsule by mouth daily.   [name], [name], (DAMIANA LEAF PO) Take 700 mg by mouth daily.   Marland Kitchen DHEA 25 MG CAPS Take 1 capsule by mouth.   . diltiazem (CARDIZEM CD) 180 MG 24 hr capsule Take 1 capsule (180 mg total) by mouth daily.   Marland Kitchen EPINEPHrine (EPIPEN 2-PAK) 0.3 mg/0.3 mL IJ SOAJ injection USE AS NEEDED   . gabapentin (NEURONTIN) 300 MG capsule TAKE 1 TO 2 CAPSULES BY MOUTH AT BEDTIME   . HYDROcodone-acetaminophen (NORCO/VICODIN) 5-325 MG tablet Take 0.5-1 tablets by mouth 2 (two) times daily as needed (No more than 1-2x a week use.).   . Insulin Pen Needle 29G X 37.6EG MISC 1 application by Does not apply route daily. 12/12/2013: To use with b 12 injection  . MAGNESIUM PO Take 1 capsule by mouth daily.   Marland Kitchen NEEDLE, DISP, 23 G 23G X 1-1/2" MISC Use as directed   . Omega-3 Fatty Acids (FISH OIL) 1000 MG CAPS Take 1 capsule by mouth 2 (two) times daily.    . Saw Palmetto, Serenoa repens, (SAW PALMETTO PO) Take 1 capsule by mouth 2 (two) times daily.   . SYRINGE-NEEDLE, DISP, 3 ML 22G X 1-1/2" 3 ML MISC Use as directed.   . testosterone cypionate (DEPOTESTOSTERONE CYPIONATE) 200 MG/ML injection INJECT 1 ML INTO THE MUSCLE EVERY 14 DAYS   . [DISCONTINUED] buPROPion (WELLBUTRIN XL) 300 MG 24 hr tablet    . [DISCONTINUED] cephALEXin (KEFLEX) 500 MG capsule Take 1 capsule (500 mg total) by mouth 3 (three) times daily.    No facility-administered encounter medications on file as of 11/08/2016.      Allergies:  Allergies  Allergen Reactions  . Bee Venom Anaphylaxis  . Flagyl [Metronidazole Hcl]   . Nsaids     REACTION: intolereance    Family History: Family History  Problem Relation Age of Onset  . Hypertension Mother   . Anxiety disorder Mother   . Non-Hodgkin's lymphoma Mother   . Dementia Father   . Depression Father        and anxiety  . Hypertension Father   . Sudden death Paternal Uncle 87  . Early death Neg Hx   . Heart disease Neg Hx          mother in 26s had lesoin    Social History: Social History  Substance Use Topics  . Smoking status: Never Smoker  . Smokeless tobacco: Never Used  . 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 Married 30 + years. No children, lots of critters.       Cancer Survivor- carcinoid lung cancer.   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 Martin Majestic back to work for ability to be insured.       Hobbies: music, home repair/improvement, cats    Review of Systems:  CONSTITUTIONAL: No fevers, chills, night sweats, or weight loss.   EYES: No visual changes or eye pain ENT: No hearing changes.  No history of nose bleeds.   RESPIRATORY: No cough, wheezing and shortness of breath.   CARDIOVASCULAR: Negative for chest pain, and palpitations.   GI: Negative for abdominal discomfort, blood in stools or black stools.  No recent change in bowel habits.   GU:  No history of incontinence.   MUSCLOSKELETAL: No history of joint pain or swelling.  No myalgias.   SKIN: Negative for lesions, rash, and itching.   HEMATOLOGY/ONCOLOGY: Negative for prolonged bleeding, bruising easily, and swollen nodes.  +history of cancer.   ENDOCRINE: Negative for cold or heat intolerance, polydipsia or goiter.   PSYCH:  +depression or anxiety symptoms.   NEURO: As Above.   Vital Signs:  BP 140/78   Pulse 64   Ht '5\' 7"'$  (1.702 m)   Wt 185 lb 3 oz (84 kg)   SpO2 98%   BMI 29.00 kg/m    General Medical Exam:   General:  Well appearing, comfortable.   Eyes/ENT: see cranial nerve examination.   Neck: No masses appreciated.  Full range of motion without tenderness.  No carotid bruits. Respiratory:  Clear to auscultation, good air entry bilaterally.   Cardiac:  Regular rate and rhythm, no murmur.   Extremities:  No deformities, edema, or skin discoloration.   Skin:  No rashes or lesions.  Neurological Exam: MENTAL STATUS including orientation to time, place, person, recent and remote memory, attention span and concentration, language, and fund of knowledge is normal.  Speech is not dysarthric. Montreal Cognitive Assessment  11/08/2016  Visuospatial/ Executive (0/5) 4  Naming (0/3) 3  Attention: Read list of digits (0/2) 1  Attention: Read list of letters (0/1) 1  Attention: Serial 7 subtraction starting at 100 (0/3) 3  Language: Repeat phrase (0/2) 2  Language : Fluency (0/1) 0  Abstraction (0/2) 2  Delayed Recall (0/5) 5  Orientation (0/6) 6  Total 27  Adjusted Score (based on education) 27    CRANIAL NERVES: II:  No visual field defects.  Unremarkable fundi.   III-IV-VI: Pupils equal round and reactive to light.  Normal conjugate, extra-ocular eye movements in all directions of gaze.  No nystagmus.  No ptosis.   V:  Normal facial sensation.     VII:  Normal facial symmetry and movements.  VIII:  Normal hearing and vestibular function.   IX-X:  Normal palatal movement.   XI:  Normal shoulder shrug and head rotation.   XII:  Normal tongue strength and range of motion, no deviation or fasciculation.  MOTOR:  No atrophy, fasciculations or abnormal movements.  No pronator drift.  Tone is normal.    Right Upper Extremity:    Left Upper Extremity:    Deltoid  5/5   Deltoid  5/5   Biceps  5/5   Biceps  5/5   Triceps  5/5   Triceps  5/5   Wrist extensors  5/5   Wrist extensors  5/5   Wrist flexors  5/5   Wrist flexors  5/5   Finger extensors  5/5   Finger extensors  5/5   Finger flexors  5/5   Finger flexors  5/5   Dorsal interossei  5/5  Dorsal interossei  5/5   Abductor pollicis  5/5   Abductor pollicis  5/5   Tone (Ashworth scale)  0  Tone (Ashworth scale)  0   Right Lower Extremity:    Left Lower Extremity:    Hip flexors  5/5   Hip flexors  5/5   Hip extensors  5/5   Hip extensors  5/5   Knee flexors  5/5   Knee flexors  5/5    Knee extensors  5/5   Knee extensors  5/5   Dorsiflexors  5/5   Dorsiflexors  5/5   Plantarflexors  5/5   Plantarflexors  5/5   Toe extensors  5/5   Toe extensors  5/5   Toe flexors  5-/5   Toe flexors  5-/5   Tone (Ashworth scale)  0  Tone (Ashworth scale)  0   MSRs:  Right                                                                 Left brachioradialis 2+  brachioradialis 2+  biceps 2+  biceps 2+  triceps 2+  triceps 2+  patellar 2+  patellar 2+  ankle jerk 0  ankle jerk 0  Hoffman no  Hoffman no  plantar response down  plantar response down   SENSORY:  Vibration is reduced to 50% at the right great toe and 20% at the left great toe.  Pin prick is reduced distally. Temperature and light touch intact.  Rhomberg sign is positive.  COORDINATION/GAIT: Normal finger-to- nose-finger Intact rapid alternating movements bilaterally.  Able to rise from a chair without using arms.  Gait narrow based and stable. Mild unsteadiness with tandem gait.  Stressed gait intact.    IMPRESSION: 1.  Idiopathic peripheral neuropathy of the feet.  His neurological examination shows a distal predominant large fiber peripheral neuropathy. 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  He has no history of diabetes or alcoholism.   I had extensive discussion with the patient regarding the pathogenesis, etiology, management, and natural course of neuropathy. Neuropathy tends to be slowly progressive, especially if a treatable etiology is not identified.  I would like to test for treatable causes of neuropathy. I discussed that in the vast majority of cases, despite checking for reversible causes, we are unable to find the underlying etiology and management is symptomatic.    2. Mild memory changes without evidence of dementia.  He scored very well on  his cognitive testing 27/30, missing one point each for attention, language and visuospatial skills.  He did very well with delayed recall making neurodegenerative condition unlikely at this time.     PLAN/RECOMMENDATIONS:  1.  Start nortriptyline '10mg'$  at bedtime for 2 week, then increase to 2 tablet at bedtime 2.  Trial of OTC topical lidocaine to feet  3.  Check ESR, copper, SPEP with IFE, heavy metal screen, vitamin B1, vitamin B12 4.  Check cognitive screen annually.  If there is progressive memory changes, he will need neuropsychological testing  Return to clinic in 4-6 months   The duration of this appointment visit was 50 minutes of  face-to-face time with the patient.  Greater than 50% of this time was spent in counseling, explanation of diagnosis, planning of further management, and coordination of care.   Thank you for allowing me to participate in patient's care.  If I can answer any additional questions, I would be pleased to do so.    Sincerely,    Henry Demeritt K. Posey Pronto, DO

## 2016-11-10 LAB — COPPER, SERUM: Copper: 95 ug/dL (ref 70–175)

## 2016-11-11 LAB — HEAVY METALS PANEL, BLOOD
Arsenic: 4 mcg/L (ref <23)
Mercury, B: <4 mcg/L (ref <=10)

## 2016-11-14 LAB — VITAMIN B1: Vitamin B1 (Thiamine): 97 nmol/L — ABNORMAL HIGH (ref 8–30)

## 2016-11-15 LAB — PROTEIN ELECTROPHORESIS, SERUM
Albumin ELP: 4.2 g/dL (ref 3.8–4.8)
Alpha-1-Globulin: 0.2 g/dL (ref 0.2–0.3)
Alpha-2-Globulin: 0.6 g/dL (ref 0.5–0.9)
BETA GLOBULIN: 0.4 g/dL (ref 0.4–0.6)
Beta 2: 0.2 g/dL (ref 0.2–0.5)
GAMMA GLOBULIN: 0.8 g/dL (ref 0.8–1.7)
Total Protein, Serum Electrophoresis: 6.4 g/dL (ref 6.1–8.1)

## 2016-11-15 LAB — IMMUNOFIXATION ELECTROPHORESIS
Immunoglobulin A: 81 mg/dL (ref 81–463)
Immunoglobulin G: 880 mg/dL (ref 694–1618)
Immunoglobulin M: 70 mg/dL (ref 48–271)

## 2016-11-17 ENCOUNTER — Ambulatory Visit (INDEPENDENT_AMBULATORY_CARE_PROVIDER_SITE_OTHER): Payer: 59 | Admitting: Clinical

## 2016-11-17 DIAGNOSIS — F4010 Social phobia, unspecified: Secondary | ICD-10-CM

## 2016-11-30 MED FILL — buPROPion HCL ER (XL) 300 M: 300 | 90 days supply | Qty: 90 | Fill #1

## 2016-12-03 MED FILL — TESTOSTERONE CYP 200 MG/ML: 200 | 84 days supply | Qty: 6 | Fill #1

## 2016-12-07 MED FILL — clonazePAM 0.5 MG TABS: 0.5 | 30 days supply | Qty: 60 | Fill #3

## 2016-12-14 MED FILL — CARTIA XT 180 MG CAPSULE SA: 180 | 90 days supply | Qty: 90 | Fill #1

## 2016-12-28 ENCOUNTER — Ambulatory Visit (INDEPENDENT_AMBULATORY_CARE_PROVIDER_SITE_OTHER): Payer: 59 | Admitting: Family Medicine

## 2016-12-28 ENCOUNTER — Encounter: Payer: Self-pay | Admitting: Family Medicine

## 2016-12-28 VITALS — BP 142/64 | HR 67 | Temp 97.9°F | Ht 68.0 in | Wt 186.0 lb

## 2016-12-28 DIAGNOSIS — Z125 Encounter for screening for malignant neoplasm of prostate: Secondary | ICD-10-CM

## 2016-12-28 DIAGNOSIS — G609 Hereditary and idiopathic neuropathy, unspecified: Secondary | ICD-10-CM | POA: Diagnosis not present

## 2016-12-28 DIAGNOSIS — Z85118 Personal history of other malignant neoplasm of bronchus and lung: Secondary | ICD-10-CM

## 2016-12-28 DIAGNOSIS — G8929 Other chronic pain: Secondary | ICD-10-CM | POA: Diagnosis not present

## 2016-12-28 DIAGNOSIS — Z79899 Other long term (current) drug therapy: Secondary | ICD-10-CM

## 2016-12-28 DIAGNOSIS — F411 Generalized anxiety disorder: Secondary | ICD-10-CM | POA: Diagnosis not present

## 2016-12-28 DIAGNOSIS — R7989 Other specified abnormal findings of blood chemistry: Secondary | ICD-10-CM

## 2016-12-28 DIAGNOSIS — R739 Hyperglycemia, unspecified: Secondary | ICD-10-CM | POA: Diagnosis not present

## 2016-12-28 DIAGNOSIS — M545 Low back pain, unspecified: Secondary | ICD-10-CM

## 2016-12-28 DIAGNOSIS — G894 Chronic pain syndrome: Secondary | ICD-10-CM

## 2016-12-28 DIAGNOSIS — E785 Hyperlipidemia, unspecified: Secondary | ICD-10-CM

## 2016-12-28 DIAGNOSIS — Z0001 Encounter for general adult medical examination with abnormal findings: Secondary | ICD-10-CM | POA: Diagnosis not present

## 2016-12-28 DIAGNOSIS — I10 Essential (primary) hypertension: Secondary | ICD-10-CM

## 2016-12-28 LAB — CBC
HCT: 49.8 % (ref 39.0–52.0)
Hemoglobin: 17.1 g/dL — ABNORMAL HIGH (ref 13.0–17.0)
MCHC: 34.4 g/dL (ref 30.0–36.0)
MCV: 95.9 fl (ref 78.0–100.0)
Platelets: 243 10*3/uL (ref 150.0–400.0)
RBC: 5.19 Mil/uL (ref 4.22–5.81)
RDW: 12 % (ref 11.5–15.5)
WBC: 6.4 10*3/uL (ref 4.0–10.5)

## 2016-12-28 LAB — COMPREHENSIVE METABOLIC PANEL
ALK PHOS: 77 U/L (ref 39–117)
ALT: 25 U/L (ref 0–53)
AST: 23 U/L (ref 0–37)
Albumin: 4.5 g/dL (ref 3.5–5.2)
BILIRUBIN TOTAL: 0.9 mg/dL (ref 0.2–1.2)
BUN: 17 mg/dL (ref 6–23)
CO2: 31 meq/L (ref 19–32)
Calcium: 9.6 mg/dL (ref 8.4–10.5)
Chloride: 103 mEq/L (ref 96–112)
Creatinine, Ser: 1.08 mg/dL (ref 0.40–1.50)
GFR: 72.57 mL/min (ref >60.00)
GLUCOSE: 109 mg/dL — AB (ref 70–99)
Potassium: 4.3 mEq/L (ref 3.5–5.1)
SODIUM: 140 meq/L (ref 135–145)
TOTAL PROTEIN: 6.6 g/dL (ref 6.0–8.3)

## 2016-12-28 LAB — TESTOSTERONE: Testosterone: 186.13 ng/dL — ABNORMAL LOW (ref 300.00–890.00)

## 2016-12-28 LAB — LIPID PANEL
CHOL/HDL RATIO: 3
Cholesterol: 169 mg/dL (ref 0–200)
HDL: 60.5 mg/dL (ref >39.00)
LDL Cholesterol: 93 mg/dL (ref 0–99)
NONHDL: 108.77
Triglycerides: 77 mg/dL (ref 0.0–149.0)
VLDL: 15.4 mg/dL (ref 0.0–40.0)

## 2016-12-28 LAB — PSA: PSA: 0.21 ng/mL (ref 0.10–4.00)

## 2016-12-28 LAB — HEMOGLOBIN A1C: Hgb A1c MFr Bld: 5.5 % (ref 4.6–6.5)

## 2016-12-28 MED ORDER — EPINEPHRINE 0.3 MG/0.3ML IJ SOAJ: INTRAMUSCULAR | 1 refills | Status: DC

## 2016-12-28 MED ORDER — HYDROCODONE-ACETAMINOPHEN 5-325 MG PO TABS: 0.5000 | ORAL_TABLET | Freq: Two times a day (BID) | ORAL | 0 refills | Status: DC | PRN

## 2016-12-28 MED FILL — HYDROCODON-APAP 5-325: 5-325 | 90 days supply | Qty: 52 | Fill #0

## 2016-12-28 MED FILL — EPINEPHRINE 0.3 MG AUTO-INJ: 0.3 | 30 days supply | Qty: 2 | Fill #0

## 2016-12-28 NOTE — Patient Instructions (Addendum)
Please stop by lab before you go  Please let us know when you get flu shot through work so we can log it into your chart  vicodin twice a week- rx should last until 6 month follow up

## 2016-12-28 NOTE — Assessment & Plan Note (Signed)
Hypogonadism- treated with testosterone by Dr. Tamala Julian. Last levels in 600s 03/2016. Repeat today

## 2016-12-28 NOTE — Assessment & Plan Note (Signed)
Neuropathy- idiopathic. Normal EMG in past. Has return visit with Dr. Posey Pronto- recently started on nortriptyline. He is taking  Gabapentin 300 to 600mg - fatigue on higher doses. Cymbalta has been suggested in past but not taking. Prior has bene on topical magnesium and voltaren. Has follow up with [facility]. Sparing vicodin 1-2x a week- signed pain contract 08/31/16. N[facility] reviewed at that time as well as today- only rx for vicodin through me within 6 months. . Have advised to avoid combo with klonopin.

## 2016-12-28 NOTE — Assessment & Plan Note (Signed)
Lung cancer history- will get updated x-ray annually for 3 years, space out after that or discontinue. Oncology has released

## 2016-12-28 NOTE — Assessment & Plan Note (Signed)
HLD- aspirin alone. Elevated triglycerides in the past. Update lipids.

## 2016-12-28 NOTE — Progress Notes (Signed)
Phone: [phone]  Subjective:  Patient presents today for their annual physical. Chief complaint-noted.   See problem oriented charting- ROS- full  review of systems was completed and negative except for:  Chronic pain from neuropathy  The following were reviewed and entered/updated in epic: Past Medical History:  Diagnosis Date  . Allergic rhinitis, cause unspecified   . 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 Depressive disorder, not elsewhere classified   . Esophageal reflux   . HTN (hypertension)   . Lymphocytic colitis   . Neuropathy   . Other testicular hypofunction   . Personal history of urinary calculi    Patient Active Problem List   Diagnosis Date Noted  . Chronic pain syndrome 08/31/2016    Priority: High  . History of lung cancer 12/11/2012    Priority: High  . Generalized anxiety disorder 03/12/2014    Priority: Medium  . Essential hypertension 03/12/2014    Priority: Medium  . Hereditary and idiopathic peripheral neuropathy 12/22/2012    Priority: Medium  . Dyspnea 07/14/2012    Priority: Medium  . Hyperlipidemia 07/14/2012    Priority: Medium  . Depression 02/16/2010    Priority: Medium  . Low testosterone 11/21/2007    Priority: Medium  . Eustachian tube dysfunction 12/17/2014    Priority: Low  . Nonspecific abnormal electrocardiogram (ECG) (EKG) 07/14/2012    Priority: Low  . Allergic reaction, history of 10/25/2011    Priority: Low  . Rectal or anal pain 10/01/2010    Priority: Low  . Allergic rhinitis 06/13/2007    Priority: Low  . GERD 06/13/2007    Priority: Low  . IBS (irritable bowel syndrome) 06/13/2007    Priority: Low  . Palpitations 08/11/2016  . 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 Peroneal tendonitis of right lower extremity 05/08/2014  . SI (sacroiliac) joint dysfunction 09/19/2013  . Nonallopathic lesion of sacral region 09/19/2013  . Nonallopathic lesion of thoracic region 09/19/2013  . Nonallopathic lesion of  lumbosacral region 09/19/2013  . Right foot pain 12/22/2012   Past Surgical History:  Procedure Laterality Date  . 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 parotid tumor surgery    . TONSILLECTOMY AND ADENOIDECTOMY      Family History  Problem Relation Age of Onset  . Hypertension Mother   . Anxiety disorder Mother   . Non-Hodgkin's lymphoma Mother   . Dementia Father   . Depression Father        and anxiety  . Hypertension Father   . Sudden death Paternal Uncle 11  . Hypertension Brother   . Early death Neg Hx   . Heart disease Neg Hx        mother in 7s had lesoin    Medications- reviewed and updated Current Outpatient Prescriptions  Medication Sig Dispense Refill  . aspirin 81 MG tablet Take 81 mg by mouth daily.    Marland Kitchen b complex vitamins tablet Take 1 tablet by mouth daily.      Marland Kitchen buPROPion (WELLBUTRIN XL) 300 MG 24 hr tablet TAKE 1 TABLET BY MOUTH EVERY DAY 90 tablet 3  . Cholecalciferol (VITAMIN D) 2000 UNITS CAPS Take 1 capsule by mouth daily.     . clonazePAM (KLONOPIN) 0.5 MG tablet Take 1 tablet (0.5 mg total) by mouth 2 (two) times daily as needed. For anxiety 60 tablet 5  . Coenzyme Q10 (CO Q 10 PO) Take 1 capsule by mouth daily.    Marland Kitchen  [place], [name], (DAMIANA LEAF PO) Take 700 mg by mouth daily.    Marland Kitchen DHEA 25 MG CAPS Take 1 capsule by mouth.    . EPINEPHrine (EPIPEN 2-PAK) 0.3 mg/0.3 mL IJ SOAJ injection USE AS NEEDED 2 Device 1  . gabapentin (NEURONTIN) 300 MG capsule TAKE 1 TO 2 CAPSULES BY MOUTH AT BEDTIME 180 capsule 1  . HYDROcodone-acetaminophen (NORCO/VICODIN) 5-325 MG tablet Take 0.5-1 tablets by mouth 2 (two) times daily as needed (No more than 1-2x a week use.). 52 tablet 0  . Insulin Pen Needle 29G X 52.7PO MISC 1 application by Does not apply route daily. 100 each 3  . MAGNESIUM PO Take 1 capsule by mouth daily.    Marland Kitchen NEEDLE, DISP, 23 G 23G X 1-1/2" MISC Use as directed 100 each 3  . Omega-3 Fatty  Acids (FISH OIL) 1000 MG CAPS Take 1 capsule by mouth 2 (two) times daily.     . Saw Palmetto, Serenoa repens, (SAW PALMETTO PO) Take 1 capsule by mouth 2 (two) times daily.    . SYRINGE-NEEDLE, DISP, 3 ML 22G X 1-1/2" 3 ML MISC Use as directed. 100 each 3  . testosterone cypionate (DEPOTESTOSTERONE CYPIONATE) 200 MG/ML injection INJECT 1 ML INTO THE MUSCLE EVERY 14 DAYS 10 mL 5  . diltiazem (CARDIZEM CD) 180 MG 24 hr capsule Take 1 capsule (180 mg total) by mouth daily. 90 capsule 3  . nortriptyline (PAMELOR) 10 MG capsule Start nortriptyline '10mg'$  at bedtime for 2 week, then increase to 2 tablet at bedtime (Patient not taking: Reported on 12/28/2016) 60 capsule 5   No current facility-administered medications for this visit.     Allergies-reviewed and updated Allergies  Allergen Reactions  . Bee Venom Anaphylaxis  . Flagyl [Metronidazole Hcl]   . Nsaids     REACTION: intolereance    Social History   Social History  . Marital status: Married    Spouse name: N/A  . Number of children: 0  . Years of education: 1   Occupational History  . 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 Smoking status: Never Smoker  . Smokeless tobacco: Never Used  . Alcohol use Yes     Comment: rare occ  . Drug use: No  . Sexual activity: Yes    Partners: Female   Other Topics Concern  . 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 Married 30 + years. No children, lots of critters.       Cancer Survivor- carcinoid lung cancer.   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 Martin Majestic back to work for ability to be insured.       Hobbies: music, home repair/improvement, cats    Objective: BP (!) 142/64 (BP Location: Left Arm, Patient Position: Sitting, Cuff Size: Large)   Pulse 67   Temp 97.9 F (36.6 C) (Oral)   Ht '5\' 8"'$  (1.727 m)   Wt 186 lb (84.4 kg)    SpO2 95%   BMI 28.28 kg/m  Gen: NAD, resting comfortably HEENT: Mucous membranes are moist. Oropharynx normal Neck: no thyromegaly CV: RRR no murmurs rubs or gallops Lungs: CTAB no crackles, wheeze, rhonchi Abdomen: soft/nontender/nondistended/normal bowel sounds. No rebound or guarding.  Ext: no edema Skin: warm, dry Neuro: grossly normal, moves all extremities, PERRLA Rectal: normal tone, normal sized prostate, no masses or tenderness  Assessment/Plan:  66 y.o. male presenting for annual physical.  Health Maintenance counseling: 1. Anticipatory guidance: Patient counseled regarding regular dental exams - q6 months, eye exams yearly, wearing seatbelts.  2. Risk factor reduction:  Advised patient of need for regular exercise and diet rich and fruits and vegetables to reduce risk of heart attack and stroke. Exercise- treadmill 2-3 days a week - but bothers his feet. Diet-reasonable- weight largely stable. Discussed getting under 180 within a year.  Wt Readings from Last 3 Encounters:  12/28/16 186 lb (84.4 kg)  11/08/16 185 lb 3 oz (84 kg)  09/16/16 182 lb (82.6 kg)  3. Immunizations/screenings/ancillary studies- discussed flu shot - will do at work. Discussed shingrix - wait on improved availability.  Immunization History  Administered Date(s) Administered  . Influenza Whole 01/04/2012  . Influenza,inj,Quad PF,6+ Mos 12/17/2014  . Influenza-Unspecified 01/17/2014, 12/28/2015  . Pneumococcal Conjugate-13 06/17/2015  . Pneumococcal Polysaccharide-23 09/07/2016  . Td 02/27/2001, 04/10/2011  . Zoster 08/24/2010    4. Prostate cancer screening-  update psa today. Low risk rectal exam.   Component Value Date   PSA 0.32 06/17/2015   PSA 0.33 10/09/2014   PSA 0.21 02/26/2014   5. Colon cancer screening -  due 06/20/17. Last 2009 with 10 year repeat.  6. Skin cancer screening-  [facility] dermatology. advised regular sunscreen use. Denies worrisome, changing, or new skin lesions.    Status of chronic or acute concerns  j Epi pen - refill .   Essential hypertension HTN- on diltiazem '180mg'$  XR , switched off amlodipine. Home #s 130s/80s- as low as 120/70. With excellent home control- continue current rx.   Generalized anxiety disorder GAD- on clonazepam 0.'5mg'$  BID essentially. I have advised psychiatry follow up. Have advised behavioral health follow up- saw Dr Gaynell Face for 4-5 visits - started using an app and yoga- has semeed to help a fair bit- but has fallen off recently. He is on wellbutrin '300mg'$  . Vybriid in past - went into bigeminy and later taken off. Using sometimes 2 at night to help him sleep- would prefer one. If out in the daytime and has to be in public - will use 1-2- discussed using 1. Discussed psychiatry if more than 30 pills in a month.   Chronic pain syndrome In addition to neuropathy also has chronic- Lumbar back pain- with work around the house or sitting prolonged period. Has 2 friends dying of multiple myeloma. On immunofixation "A faint monoclonal free lambda light chain is detected." CT low back question from him- to avoid radiation- discussed doing labwork first- I told him I thought this was very low risk but with his cancer history - very high anxiety. .    Low testosterone Hypogonadism- treated with testosterone by Dr. Tamala Julian. Last levels in 600s 03/2016. Repeat today  Hyperlipidemia HLD- aspirin alone. Elevated triglycerides in the past. Update lipids.  History of lung cancer Lung cancer history- will get updated x-ray annually for 3 years, space out after that or discontinue. Oncology has released  Hereditary and idiopathic peripheral neuropathy Neuropathy- idiopathic. Normal EMG in past. Has return visit with Dr. Posey Pronto- recently started on nortriptyline. He is taking  Gabapentin 300 to '600mg'$ - fatigue on higher doses. Cymbalta has been suggested in past but not taking. Prior has bene on topical magnesium and voltaren. Has follow up with  [facility]. Sparing vicodin 1-2x a week- signed pain contract 08/31/16. NCCSRS reviewed at that time as well as today- only rx for vicodin through me within 6 months. Marland Kitchen  Have advised to avoid combo with klonopin.   Future Appointments Date Time Provider [place]  04/13/2017 8:00 AM Narda Amber K, DO [facility] None   Return in about 6 months (around 06/27/2017) for follow up.  Orders Placed This Encounter  Procedures  . DG Chest 2 View    Standing Status:   Future    Standing Expiration Date:   02/27/2018    Order Specific Question:   Reason for Exam (SYMPTOM  OR DIAGNOSIS REQUIRED)    Answer:   history lung cancer    Order Specific Question:   Preferred imaging location?    Answer:   [facility]  . Kappa/lambda light chains  . CBC    [place]  . Comprehensive metabolic panel    [place]    Order Specific Question:   Has the patient fasted?    Answer:   No  . Lipid panel    [place]    Order Specific Question:   Has the patient fasted?    Answer:   No  . PSA  . Hemoglobin A1c    [place]  . Pain Mgmt, Profile 8 w/Conf, U  . Testosterone  . DHEA-sulfate  . Multiple Myeloma Panel (SPEP&IFE w/QIG)   Meds ordered this encounter  Medications  . EPINEPHrine (EPIPEN 2-PAK) 0.3 mg/0.3 mL IJ SOAJ injection    Sig: USE AS NEEDED    Dispense:  2 Device    Refill:  1  . HYDROcodone-acetaminophen (NORCO/VICODIN) 5-325 MG tablet    Sig: Take 0.5-1 tablets by mouth 2 (two) times daily as needed (No more than 1-2x a week use.).    Dispense:  52 tablet    Refill:  0   Return precautions advised.  [name], MD

## 2016-12-28 NOTE — Assessment & Plan Note (Addendum)
HTN- on diltiazem 180mg  XR , switched off amlodipine. Home #s 130s/80s- as low as 120/70. With excellent home control- continue current rx.

## 2016-12-28 NOTE — Assessment & Plan Note (Signed)
In addition to neuropathy also has chronic- Lumbar back pain- with work around the house or sitting prolonged period. Has 2 friends dying of multiple myeloma. On immunofixation "A faint monoclonal free lambda light chain is detected." CT low back question from him- to avoid radiation- discussed doing labwork first- I told him I thought this was very low risk but with his cancer history - very high anxiety. Marland Kitchen

## 2016-12-28 NOTE — Assessment & Plan Note (Signed)
GAD- on clonazepam 0.5mg  BID essentially. I have advised psychiatry follow up. Have advised behavioral health follow up- saw Dr Gaynell Face for 4-5 visits - started using an app and yoga- has semeed to help a fair bit- but has fallen off recently. He is on wellbutrin 300mg  . Vybriid in past - went into bigeminy and later taken off. Using sometimes 2 at night to help him sleep- would prefer one. If out in the daytime and has to be in public - will use 1-2- discussed using 1. Discussed psychiatry if more than 30 pills in a month.

## 2016-12-29 ENCOUNTER — Encounter: Payer: Self-pay | Admitting: Family Medicine

## 2017-01-01 LAB — PAIN MGMT, PROFILE 8 W/CONF, U
6 Acetylmorphine: NEGATIVE ng/mL (ref <10)
ALPHAHYDROXYALPRAZOLAM: NEGATIVE ng/mL (ref <25)
ALPHAHYDROXYTRIAZOLAM: NEGATIVE ng/mL (ref <50)
AMINOCLONAZEPAM: 160 ng/mL — AB (ref <25)
Alcohol Metabolites: NEGATIVE ng/mL (ref <500)
Alphahydroxymidazolam: NEGATIVE ng/mL (ref <50)
Amphetamines: NEGATIVE ng/mL (ref <500)
BENZODIAZEPINES: POSITIVE ng/mL — AB (ref <100)
BUPRENORPHINE, URINE: NEGATIVE ng/mL (ref <5)
Cocaine Metabolite: NEGATIVE ng/mL (ref <150)
Codeine: NEGATIVE ng/mL (ref <50)
Creatinine: 69.7 mg/dL
HYDROCODONE: NEGATIVE ng/mL (ref <50)
HYDROMORPHONE: NEGATIVE ng/mL (ref <50)
HYDROXYETHYLFLURAZEPAM: NEGATIVE ng/mL (ref <50)
Lorazepam: NEGATIVE ng/mL (ref <50)
MARIJUANA METABOLITE: NEGATIVE ng/mL (ref <20)
MDMA: NEGATIVE ng/mL (ref <500)
Morphine: NEGATIVE ng/mL (ref <50)
Nordiazepam: NEGATIVE ng/mL (ref <50)
Norhydrocodone: 66 ng/mL — ABNORMAL HIGH (ref <50)
OPIATES: POSITIVE ng/mL — AB (ref <100)
OXAZEPAM: NEGATIVE ng/mL (ref <50)
Oxidant: NEGATIVE ug/mL (ref <200)
Oxycodone: NEGATIVE ng/mL (ref <100)
Temazepam: NEGATIVE ng/mL (ref <50)
pH: 6.79 (ref 4.5–9.0)

## 2017-01-01 LAB — MULTIPLE MYELOMA PANEL, SERUM
ALBUMIN SERPL ELPH-MCNC: 4 g/dL (ref 2.9–4.4)
Albumin/Glob SerPl: 1.3 (ref 0.7–1.7)
Alpha 1: 0.3 g/dL (ref 0.0–0.4)
Alpha2 Glob SerPl Elph-Mcnc: 0.6 g/dL (ref 0.4–1.0)
B-Globulin SerPl Elph-Mcnc: 1 g/dL (ref 0.7–1.3)
Gamma Glob SerPl Elph-Mcnc: 1.2 g/dL (ref 0.4–1.8)
Globulin, Total: 3.1 g/dL (ref 2.2–3.9)
IGM (IMMUNOGLOBULIN M), SRM: 79 mg/dL (ref 20–172)
IgA/Immunoglobulin A, Serum: 87 mg/dL (ref 61–437)
IgG (Immunoglobin G), Serum: 939 mg/dL (ref 700–1600)
TOTAL PROTEIN: 7.1 g/dL (ref 6.0–8.5)

## 2017-01-01 LAB — KAPPA/LAMBDA LIGHT CHAINS
Kappa free light chain: 10 mg/L (ref 3.3–19.4)
Kappa:Lambda Ratio: 1.22 (ref 0.26–1.65)
Lambda Free Lght Chn: 8.2 mg/L (ref 5.7–26.3)

## 2017-01-01 LAB — DHEA-SULFATE: DHEA SO4: 580 ug/dL — AB (ref 24–244)

## 2017-01-06 MED FILL — clonazePAM 0.5 MG TABS: 0.5 | 30 days supply | Qty: 60 | Fill #4

## 2017-01-25 DIAGNOSIS — H524 Presbyopia: Secondary | ICD-10-CM | POA: Diagnosis not present

## 2017-01-25 DIAGNOSIS — H52203 Unspecified astigmatism, bilateral: Secondary | ICD-10-CM | POA: Diagnosis not present

## 2017-02-07 MED FILL — clonazePAM 0.5 MG TABS: 0.5 | 30 days supply | Qty: 60 | Fill #5

## 2017-02-07 MED FILL — GABAPENTIN 300 MG CAPSULE: 300 | 90 days supply | Qty: 180 | Fill #1

## 2017-02-09 ENCOUNTER — Ambulatory Visit (INDEPENDENT_AMBULATORY_CARE_PROVIDER_SITE_OTHER)
Admission: RE | Admit: 2017-02-09 | Discharge: 2017-02-09 | Disposition: A | Discharge status: Home / Self Care | Payer: 59 | Source: Ambulatory Visit | Attending: Family Medicine | Admitting: Family Medicine

## 2017-02-09 DIAGNOSIS — Z85118 Personal history of other malignant neoplasm of bronchus and lung: Secondary | ICD-10-CM

## 2017-02-09 DIAGNOSIS — Z0000 Encounter for general adult medical examination without abnormal findings: Secondary | ICD-10-CM | POA: Diagnosis not present

## 2017-02-09 DIAGNOSIS — Z0001 Encounter for general adult medical examination with abnormal findings: Secondary | ICD-10-CM

## 2017-02-10 ENCOUNTER — Encounter: Payer: Self-pay | Admitting: Family Medicine

## 2017-02-22 ENCOUNTER — Encounter: Payer: Self-pay | Admitting: Family Medicine

## 2017-02-22 DIAGNOSIS — L821 Other seborrheic keratosis: Secondary | ICD-10-CM | POA: Diagnosis not present

## 2017-02-22 DIAGNOSIS — D229 Melanocytic nevi, unspecified: Secondary | ICD-10-CM | POA: Diagnosis not present

## 2017-02-22 DIAGNOSIS — D1801 Hemangioma of skin and subcutaneous tissue: Secondary | ICD-10-CM | POA: Diagnosis not present

## 2017-02-22 DIAGNOSIS — L814 Other melanin hyperpigmentation: Secondary | ICD-10-CM | POA: Diagnosis not present

## 2017-02-22 NOTE — Telephone Encounter (Signed)
I attempted to call the patient to get scheduled. I left a voicemail for the patient to call to make the appointment.

## 2017-02-28 MED FILL — BUPROPION HCL XL 300 MG TAB: 300 | 90 days supply | Qty: 90 | Fill #2

## 2017-03-02 ENCOUNTER — Ambulatory Visit: Payer: 59 | Admitting: Neurology

## 2017-03-02 ENCOUNTER — Encounter: Payer: Self-pay | Admitting: Neurology

## 2017-03-02 VITALS — BP 160/83 | HR 83 | Ht 68.0 in | Wt 192.4 lb

## 2017-03-02 DIAGNOSIS — I669 Occlusion and stenosis of unspecified cerebral artery: Secondary | ICD-10-CM | POA: Diagnosis not present

## 2017-03-02 DIAGNOSIS — R42 Dizziness and giddiness: Secondary | ICD-10-CM

## 2017-03-02 DIAGNOSIS — C7931 Secondary malignant neoplasm of brain: Secondary | ICD-10-CM | POA: Diagnosis not present

## 2017-03-02 DIAGNOSIS — R51 Headache: Secondary | ICD-10-CM

## 2017-03-02 DIAGNOSIS — H9190 Unspecified hearing loss, unspecified ear: Secondary | ICD-10-CM | POA: Diagnosis not present

## 2017-03-02 DIAGNOSIS — I679 Cerebrovascular disease, unspecified: Secondary | ICD-10-CM

## 2017-03-02 DIAGNOSIS — I671 Cerebral aneurysm, nonruptured: Secondary | ICD-10-CM | POA: Diagnosis not present

## 2017-03-02 DIAGNOSIS — I7774 Dissection of vertebral artery: Secondary | ICD-10-CM | POA: Diagnosis not present

## 2017-03-02 DIAGNOSIS — I639 Cerebral infarction, unspecified: Secondary | ICD-10-CM

## 2017-03-02 DIAGNOSIS — R519 Headache, unspecified: Secondary | ICD-10-CM

## 2017-03-02 NOTE — Progress Notes (Signed)
[facility]    Provider:  Dr Jaynee Eagles Referring Provider: Marin Olp, MD Primary Care Physician:  Marin Olp, MD  CC:  Acute onset vertigo and dizziness evaluate for stroke  HPI:  [name] Page is a 66 y.o. male here as a referral from Dr. Yong Channel for evaluation of stroke. PMHx HTN, Lymphocytic collitis, neuropathy, carcinoid tumor, depression, hld, lung cancer, right foot pain, anxiety. He had acute onset of dizziness, alteration of awareness, ataxia, fell, could not stand, no balance, nausea,  Vertigo, dizziness. He has hearing changes, hearing loss in left ear, laying down made it better, unknown whether had weakness or facial droop, no one was there. Symptoms lasted several days, it was worse the next day, similar symptoms. He felt something is terribly wrong, he ran to the bathroom, thought he was going to throw up, called his wife and they went to urgent care, his blood pressure was over 295 systolic. His blood pressure ran high all day, He has a new headache, positional worse laying down. Never had headaches before. Hx of cancer. Constant weirdness in the head. He has pain in the back of his neck (points to the area of the vertebral artery). No other focal neurologic deficits, associated symptoms, inciting events or modifiable factors.  Reviewed notes, labs and imaging from outside physicians, which showed:  CMP, cbc unremarkable, ldl 93, hgba1c 5.5  MRI brain 2011: reviewed images and agree with the following:  IMPRESSION:   1.  No evidence for metastatic disease to the brain. 2.  Normal MRI of the brain. 3.  Polyps or mucous retention cyst at the floor the maxillary sinuses bilaterally.  Review of Systems: Patient complains of symptoms per HPI as well as the following symptoms:vertigo,nausea. Pertinent negatives and positives per HPI. All others negative.   Social History   Socioeconomic History  . Marital status: Married    Spouse name: Not  on file  . Number of children: 0  . Years of education: 82  . Highest education level: Not on file  Social Needs  . Financial resource strain: Not on file  . Food insecurity - worry: Not on file  . Food insecurity - inability: Not on file  . Transportation needs - medical: Not on file  . Transportation needs - non-medical: Not on file  Occupational History  . 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 Smoking status: Never Smoker  . Smokeless tobacco: Never Used  . Tobacco comment: exposed to second hand smoke alot when he was younger  Substance and Sexual Activity  . Alcohol use: Yes    Comment: rare occ beer with dinner  . Drug use: No  . Sexual activity: Yes    Partners: Female  Other Topics Concern  . 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 No children, lots of critters.       Cancer Survivor- carcinoid lung cancer.   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 Martin Majestic back to work for ability to be insured.       Hobbies: music, home repair/improvement, cats    Family History  Problem Relation Age of Onset  . Hypertension Mother   . Anxiety disorder Mother   . Non-Hodgkin's lymphoma Mother   . Dementia Father   . Depression Father  and anxiety  . Hypertension Father   . Sudden death Paternal Uncle 62  . Hypertension Brother   . Early death Neg Hx   . Heart disease Neg Hx        mother in 69s had lesoin    Past Medical History:  Diagnosis Date  . Allergic rhinitis, cause unspecified   . 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 Depressive disorder, not elsewhere classified   . Esophageal reflux   . HTN (hypertension)   . Lymphocytic colitis   . Neuropathy   . Other testicular hypofunction   . Personal history of urinary calculi     Past Surgical History:    Procedure Laterality Date  . 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 parotid tumor surgery    . TONSILLECTOMY AND ADENOIDECTOMY      Current Outpatient Medications  Medication Sig Dispense Refill  . aspirin 81 MG tablet Take 81 mg by mouth daily.    Marland Kitchen b complex vitamins tablet Take 1 tablet by mouth daily.      Marland Kitchen buPROPion (WELLBUTRIN XL) 300 MG 24 hr tablet TAKE 1 TABLET BY MOUTH EVERY DAY 90 tablet 3  . Cholecalciferol (VITAMIN D) 2000 UNITS CAPS Take 1 capsule by mouth daily.     . clonazePAM (KLONOPIN) 0.5 MG tablet Take 1 tablet (0.5 mg total) by mouth 2 (two) times daily as needed. For anxiety 60 tablet 5  . Coenzyme Q10 (CO Q 10 PO) Take 1 capsule by mouth daily.    [name], [name], (DAMIANA LEAF PO) Take 700 mg by mouth daily.    Marland Kitchen DHEA 25 MG CAPS Take 1 capsule by mouth.    . EPINEPHrine (EPIPEN 2-PAK) 0.3 mg/0.3 mL IJ SOAJ injection USE AS NEEDED 2 Device 1  . gabapentin (NEURONTIN) 300 MG capsule TAKE 1 TO 2 CAPSULES BY MOUTH AT BEDTIME 180 capsule 1  . HYDROcodone-acetaminophen (NORCO/VICODIN) 5-325 MG tablet Take 0.5-1 tablets by mouth 2 (two) times daily as needed (No more than 1-2x a week use.). 52 tablet 0  . Insulin Pen Needle 29G X 34.1PF MISC 1 application by Does not apply route daily. 100 each 3  . MAGNESIUM PO Take 1 capsule by mouth daily.    Marland Kitchen NEEDLE, DISP, 23 G 23G X 1-1/2" MISC Use as directed 100 each 3  . Omega-3 Fatty Acids (FISH OIL) 1000 MG CAPS Take 1 capsule by mouth 2 (two) times daily.     . Saw Palmetto, Serenoa repens, (SAW PALMETTO PO) Take 1 capsule by mouth 2 (two) times daily.    . SYRINGE-NEEDLE, DISP, 3 ML 22G X 1-1/2" 3 ML MISC Use as directed. 100 each 3  . testosterone cypionate (DEPOTESTOSTERONE CYPIONATE) 200 MG/ML injection INJECT 1 ML INTO THE MUSCLE EVERY 14 DAYS 10 mL 5  . diltiazem (CARDIZEM CD) 180 MG 24 hr capsule Take 1 capsule (180 mg total) by mouth daily. 90 capsule 3   No  current facility-administered medications for this visit.     Allergies as of 03/02/2017 - Review Complete 03/02/2017  Allergen Reaction Noted  . Bee venom Anaphylaxis 11/18/2011  . Flagyl [metronidazole hcl]  06/07/2011  . Nsaids      Vitals: BP (!) 160/83 (BP Location: Right Arm, Patient Position: Sitting)   Pulse 83   Ht 5\' 8"  (1.727 m)   Wt 192 lb 6.4 oz (87.3 kg)   BMI 29.25 kg/m  Last Weight:  Wt Readings from Last 1 Encounters:  03/02/17 192 lb 6.4 oz (87.3 kg)   Last Height:   Ht Readings from Last 1 Encounters:  03/02/17 5\' 8"  (1.727 m)    Physical exam: Exam: Gen: NAD, conversant, well nourised, well groomed                     CV: RRR, no MRG. No Carotid Bruits. No peripheral edema, warm, nontender Eyes: Conjunctivae clear without exudates or hemorrhage  Neuro: Detailed Neurologic Exam  Speech:    Speech is normal; fluent and spontaneous with normal comprehension.  Cognition:    The patient is oriented to person, place, and time;     recent and remote memory intact;     language fluent;     normal attention, concentration,     fund of knowledge Cranial Nerves:    The pupils are equal, round, and reactive to light. The fundi are normal and spontaneous venous pulsations are present. Visual fields are full to finger confrontation. Extraocular movements are intact. Trigeminal sensation is intact and the muscles of mastication are normal. Left nasolabial flattening.left lower facial weakness mild.. The palate elevates in the midline. Hearing intact. Voice is normal. Shoulder shrug is normal. The tongue has normal motion without fasciculations.   Coordination:    Normal finger to nose and heel to shin. Normal rapid alternating movements.   Gait:    Heel-toe and tandem gait with imbalance.   Motor Observation:    No asymmetry, no atrophy, and no involuntary movements noted. Tone:    Normal muscle tone.    Posture:    Posture is normal. normal erect      Strength:    Strength is V/V in the upper and lower limbs.      Sensation: intact to LT, sway on Romberg     Reflex Exam:  DTR's:    Deep tendon reflexes in the upper and lower extremities are normal bilaterally.   Toes:    The toes are downgoing bilaterally.   Clonus:    Clonus is absent.      Assessment/Plan:  66 year old patient with HTN, HLD with acute onset vertigo, left facial droop, dizziness, fall, new onset headache, hearing loss. PMHx of lung cancer. Needs MRI of the brain w/wo contrast to evaluate for Metastasis, stroke, schwannoma or other masses and lesions. Stroke due to dissection or artery-to-artery emboli or other mechanism also a possibility need MRA of the head and neck to evaluate for cerebrovascular disease or dissection. Exam shows left facial weakness and imbalance.   Orders Placed This Encounter  Procedures  . MR MRA NECK W WO CONTRAST  . MR MRA HEAD WO CONTRAST  . MR BRAIN W WO CONTRAST     Sarina Ill, MD  [facility] [address] [place], [place] [address]  Phone [phone] Fax [phone]

## 2017-03-07 ENCOUNTER — Ambulatory Visit: Payer: Self-pay | Admitting: Internal Medicine

## 2017-03-07 ENCOUNTER — Encounter: Payer: Self-pay | Admitting: Family Medicine

## 2017-03-07 ENCOUNTER — Ambulatory Visit (INDEPENDENT_AMBULATORY_CARE_PROVIDER_SITE_OTHER): Payer: 59 | Admitting: Family Medicine

## 2017-03-07 VITALS — BP 152/78 | HR 80 | Temp 98.2°F | Ht 68.0 in | Wt 190.0 lb

## 2017-03-07 DIAGNOSIS — I10 Essential (primary) hypertension: Secondary | ICD-10-CM | POA: Diagnosis not present

## 2017-03-07 DIAGNOSIS — R7989 Other specified abnormal findings of blood chemistry: Secondary | ICD-10-CM | POA: Diagnosis not present

## 2017-03-07 MED ORDER — CHLORTHALIDONE 25 MG PO TABS: 12.5000 mg | ORAL_TABLET | Freq: Every day | ORAL | 3 refills | Status: DC

## 2017-03-07 MED FILL — CHLORTHALIDONE 25 MG TAB: 25 | 30 days supply | Qty: 30 | Fill #0

## 2017-03-07 NOTE — Patient Instructions (Signed)
Cut your testosterone to 100mg  every 2 weeks (0.55ml). I also referred you to [facility] endocrine for follow up (may take some time to get in for this)  Start chlorthalidone 12.5mg  (half tablet). I sent in as full pill but lets start with half  2 week follow up and can recheck bmet as well as blood pressure that time. Can continue home monitoring as long as not anxiety provoking.

## 2017-03-07 NOTE — Assessment & Plan Note (Signed)
S: controlled on diltiazem 180mg  XR at last visit on home checks though in office was slightly high at 142/64 Off amlodipine due to edema.   He states he decided to go up to 200mg  on his testosterone every 2 weeks instead of every 3 weeks with low testosterone on labs- I had forwarded results to Dr. Tamala Julian for decision but patient made this decision on his own after reading on uptodate.   He had one bad week of vertigo and some left facial weakness- both of which have improved though has some residual balance issues and MRI scheduled day after tomorrow through neurology. He has stopped gabapentin and DHEA  During this time of not feeling well started feeling poorly and checked home BPs again and were as high as even 419-379 systolic. Mild headache with this.  BP Readings from Last 3 Encounters:  03/07/17 (!) 152/78  03/02/17 (!) 160/83  12/28/16 (!) 142/64  A/P: We discussed blood pressure goal of <140/90. Above goal at home and here- will add chlorthalidone 12.5mg  with 2 week follow up and bmp. He is concerned about adding lisinopril with angioedema risks. Will reduce testosterone by 50% as well and also see low testosterone section- given increase seemed to come after increasing testosterone.

## 2017-03-07 NOTE — Assessment & Plan Note (Signed)
S: has been managed by Dr. Tamala Julian of sports medicine since 2015 A/P: with increase in BP seemingly after increasing testosterone- patient would prefer to see endocrine and have placed referral. We also reduced his dose to 100mg  every 2 weeks from 200mg  every 2 weeks today.

## 2017-03-07 NOTE — Progress Notes (Signed)
Subjective:  [name] Page is a 66 y.o. year old very pleasant male patient who presents for/with See problem oriented charting ROS- slight headache, no blurry vision. No extremity weakness. Vertigo has resolved. Has some fatigue.    Past Medical History-  Patient Active Problem List   Diagnosis Date Noted  . Chronic pain syndrome 08/31/2016    Priority: High  . History of lung cancer 12/11/2012    Priority: High  . Generalized anxiety disorder 03/12/2014    Priority: Medium  . Essential hypertension 03/12/2014    Priority: Medium  . Hereditary and idiopathic peripheral neuropathy 12/22/2012    Priority: Medium  . Dyspnea 07/14/2012    Priority: Medium  . Hyperlipidemia 07/14/2012    Priority: Medium  . Depression 02/16/2010    Priority: Medium  . Low testosterone 11/21/2007    Priority: Medium  . Eustachian tube dysfunction 12/17/2014    Priority: Low  . Nonspecific abnormal electrocardiogram (ECG) (EKG) 07/14/2012    Priority: Low  . Allergic reaction, history of 10/25/2011    Priority: Low  . Rectal or anal pain 10/01/2010    Priority: Low  . Allergic rhinitis 06/13/2007    Priority: Low  . GERD 06/13/2007    Priority: Low  . IBS (irritable bowel syndrome) 06/13/2007    Priority: Low  . Palpitations 08/11/2016  . 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 Peroneal tendonitis of right lower extremity 05/08/2014  . SI (sacroiliac) joint dysfunction 09/19/2013  . Nonallopathic lesion of sacral region 09/19/2013  . Nonallopathic lesion of thoracic region 09/19/2013  . Nonallopathic lesion of lumbosacral region 09/19/2013  . Right foot pain 12/22/2012    Medications- reviewed and updated Current Outpatient Medications  Medication Sig Dispense Refill  . aspirin 81 MG tablet Take 81 mg by mouth daily.    Marland Kitchen b complex vitamins tablet Take 1 tablet by mouth daily.      Marland Kitchen buPROPion (WELLBUTRIN XL) 300 MG 24 hr tablet TAKE 1 TABLET BY MOUTH EVERY DAY 90 tablet 3  .  Cholecalciferol (VITAMIN D) 2000 UNITS CAPS Take 1 capsule by mouth daily.     . clonazePAM (KLONOPIN) 0.5 MG tablet Take 1 tablet (0.5 mg total) by mouth 2 (two) times daily as needed. For anxiety 60 tablet 5  . Coenzyme Q10 (CO Q 10 PO) Take 1 capsule by mouth daily.    [name], [name], (DAMIANA LEAF PO) Take 700 mg by mouth daily.    Marland Kitchen DHEA 25 MG CAPS Take 1 capsule by mouth.    . EPINEPHrine (EPIPEN 2-PAK) 0.3 mg/0.3 mL IJ SOAJ injection USE AS NEEDED 2 Device 1  . HYDROcodone-acetaminophen (NORCO/VICODIN) 5-325 MG tablet Take 0.5-1 tablets by mouth 2 (two) times daily as needed (No more than 1-2x a week use.). 52 tablet 0  . Insulin Pen Needle 29G X 66.2HU MISC 1 application by Does not apply route daily. 100 each 3  . MAGNESIUM PO Take 1 capsule by mouth daily.    Marland Kitchen NEEDLE, DISP, 23 G 23G X 1-1/2" MISC Use as directed 100 each 3  . Omega-3 Fatty Acids (FISH OIL) 1000 MG CAPS Take 1 capsule by mouth 2 (two) times daily.     . Saw Palmetto, Serenoa repens, (SAW PALMETTO PO) Take 1 capsule by mouth 2 (two) times daily.    . SYRINGE-NEEDLE, DISP, 3 ML 22G X 1-1/2" 3 ML MISC Use as directed. 100 each 3  . testosterone cypionate (DEPOTESTOSTERONE CYPIONATE) 200 MG/ML injection INJECT 1  ML INTO THE MUSCLE EVERY 14 DAYS 10 mL 5  . diltiazem (CARDIZEM CD) 180 MG 24 hr capsule Take 1 capsule (180 mg total) by mouth daily. 90 capsule 3  . gabapentin (NEURONTIN) 300 MG capsule TAKE 1 TO 2 CAPSULES BY MOUTH AT BEDTIME (Patient not taking: Reported on 03/07/2017) 180 capsule 1   No current facility-administered medications for this visit.     Objective: BP (!) 152/78 (BP Location: Left Arm, Patient Position: Sitting, Cuff Size: Large)   Pulse 80   Temp 98.2 F (36.8 C) (Oral)   Ht 5\' 8"  (1.727 m)   Wt 190 lb (86.2 kg)   SpO2 95%   BMI 28.89 kg/m  Gen: NAD, resting comfortably CV: RRR no murmurs rubs or gallops Lungs: CTAB no crackles, wheeze, rhonchi Ext: no edema Skin: warm,  dry  Assessment/Plan:  Essential hypertension S: controlled on diltiazem 180mg  XR at last visit on home checks though in office was slightly high at 142/64 Off amlodipine due to edema.   He states he decided to go up to 200mg  on his testosterone every 2 weeks instead of every 3 weeks with low testosterone on labs- I had forwarded results to Dr. Tamala Julian for decision but patient made this decision on his own after reading on uptodate.   He had one bad week of vertigo and some left facial weakness- both of which have improved though has some residual balance issues and MRI scheduled day after tomorrow through neurology. He has stopped gabapentin and DHEA  During this time of not feeling well started feeling poorly and checked home BPs again and were as high as even 468-032 systolic. Mild headache with this.  BP Readings from Last 3 Encounters:  03/07/17 (!) 152/78  03/02/17 (!) 160/83  12/28/16 (!) 142/64  A/P: We discussed blood pressure goal of <140/90. Above goal at home and here- will add chlorthalidone 12.5mg  with 2 week follow up and bmp. He is concerned about adding lisinopril with angioedema risks. Will reduce testosterone by 50% as well and also see low testosterone section- given increase seemed to come after increasing testosterone.   Low testosterone S: has been managed by Dr. Tamala Julian of sports medicine since 2015 A/P: with increase in BP seemingly after increasing testosterone- patient would prefer to see endocrine and have placed referral. We also reduced his dose to 100mg  every 2 weeks from 200mg  every 2 weeks today.     Future Appointments  Date Time Provider [place]  03/09/2017  8:15 AM [facility] None  03/22/2017  1:15 PM Marin Olp, MD [facility]  04/13/2017  8:00 AM Narda Amber K, DO [facility] None  06/28/2017  8:15 AM Yong Channel, Brayton Mars, MD [facility]     Orders Placed This Encounter  Procedures  . Ambulatory referral to Endocrinology     Referral Priority:   Routine    Referral Type:   Consultation    Referral Reason:   Specialty Services Required    Number of Visits Requested:   1    Meds ordered this encounter  Medications  . chlorthalidone (HYGROTON) 25 MG tablet    Sig: Take 0.5-1 tablets (12.5-25 mg total) by mouth daily.    Dispense:  30 tablet    Refill:  3    Return precautions advised.  [name], MD

## 2017-03-09 ENCOUNTER — Other Ambulatory Visit: Payer: 59

## 2017-03-09 ENCOUNTER — Ambulatory Visit (INDEPENDENT_AMBULATORY_CARE_PROVIDER_SITE_OTHER): Payer: 59

## 2017-03-09 DIAGNOSIS — C7931 Secondary malignant neoplasm of brain: Secondary | ICD-10-CM

## 2017-03-09 DIAGNOSIS — I679 Cerebrovascular disease, unspecified: Secondary | ICD-10-CM | POA: Diagnosis not present

## 2017-03-09 DIAGNOSIS — R51 Headache: Secondary | ICD-10-CM

## 2017-03-09 DIAGNOSIS — I671 Cerebral aneurysm, nonruptured: Secondary | ICD-10-CM | POA: Diagnosis not present

## 2017-03-09 DIAGNOSIS — I7774 Dissection of vertebral artery: Secondary | ICD-10-CM | POA: Diagnosis not present

## 2017-03-09 DIAGNOSIS — I669 Occlusion and stenosis of unspecified cerebral artery: Secondary | ICD-10-CM

## 2017-03-09 DIAGNOSIS — H7090 Unspecified mastoiditis, unspecified ear: Secondary | ICD-10-CM | POA: Insufficient documentation

## 2017-03-09 DIAGNOSIS — R42 Dizziness and giddiness: Secondary | ICD-10-CM

## 2017-03-09 DIAGNOSIS — R519 Headache, unspecified: Secondary | ICD-10-CM

## 2017-03-09 DIAGNOSIS — I639 Cerebral infarction, unspecified: Secondary | ICD-10-CM

## 2017-03-09 DIAGNOSIS — H9190 Unspecified hearing loss, unspecified ear: Secondary | ICD-10-CM | POA: Diagnosis not present

## 2017-03-09 MED ORDER — GADOPENTETATE DIMEGLUMINE 469.01 MG/ML IV SOLN: 20.0000 mL | Freq: Once | INTRAVENOUS | Status: DC | PRN

## 2017-03-10 ENCOUNTER — Telehealth: Payer: Self-pay | Admitting: *Deleted

## 2017-03-10 MED FILL — CARTIA XT 180 MG CAPSULE SA: 180 | 90 days supply | Qty: 90 | Fill #2

## 2017-03-10 NOTE — Telephone Encounter (Addendum)
Called the patient and left detailed voicemail (ok per DPR) informing patient of all information listed below regarding results of MRI brain, MRA neck, and MRA head. I advised him if he is feeling better no need to do anything. I asked for a call back if he would like for [place] to refer to him to ENT or if has any questions. Left office number on message.    ----- Message from Melvenia Beam, MD sent at 03/10/2017  8:28 AM EST ----- MRA brain unremarkable thanks  MRA neck unremarkable thanks   Notes recorded by Melvenia Beam, MD on 03/10/2017 at 8:23 AM EST MRI of the brain normal for age. He has ear fluid and mastoiditis which is likely why he had the vertigo. If he is feeling better no need to do anything but can refer him to ENT if he likes.thanks

## 2017-03-11 ENCOUNTER — Other Ambulatory Visit: Payer: Self-pay | Admitting: Family Medicine

## 2017-03-11 MED FILL — TESTOSTERONE CYP 200 MG/ML: 200 | 28 days supply | Qty: 2 | Fill #0

## 2017-03-11 MED FILL — clonazePAM 0.5 MG TABS: 0.5 | 30 days supply | Qty: 60 | Fill #0

## 2017-03-12 ENCOUNTER — Other Ambulatory Visit: Payer: Self-pay | Admitting: Neurology

## 2017-03-12 DIAGNOSIS — A881 Epidemic vertigo: Secondary | ICD-10-CM

## 2017-03-12 DIAGNOSIS — IMO0001 Reserved for inherently not codable concepts without codable children: Secondary | ICD-10-CM

## 2017-03-12 MED ORDER — AMOXICILLIN-POT CLAVULANATE 875-125 MG PO TABS: 1.0000 | ORAL_TABLET | Freq: Two times a day (BID) | ORAL | 0 refills | Status: DC

## 2017-03-12 MED ORDER — CIPROFLOXACIN-DEXAMETHASONE 0.3-0.1 % OT SUSP: 4.0000 [drp] | Freq: Two times a day (BID) | OTIC | 0 refills | Status: DC

## 2017-03-18 ENCOUNTER — Ambulatory Visit
Admission: RE | Admit: 2017-03-18 | Discharge: 2017-03-18 | Disposition: A | Discharge status: Home / Self Care | Payer: 59 | Source: Ambulatory Visit | Attending: Neurology | Admitting: Neurology

## 2017-03-18 ENCOUNTER — Other Ambulatory Visit: Payer: Self-pay | Admitting: Neurology

## 2017-03-18 DIAGNOSIS — R42 Dizziness and giddiness: Secondary | ICD-10-CM

## 2017-03-18 DIAGNOSIS — IMO0001 Reserved for inherently not codable concepts without codable children: Secondary | ICD-10-CM

## 2017-03-18 DIAGNOSIS — A881 Epidemic vertigo: Secondary | ICD-10-CM

## 2017-03-18 DIAGNOSIS — H9042 Sensorineural hearing loss, unilateral, left ear, with unrestricted hearing on the contralateral side: Secondary | ICD-10-CM | POA: Diagnosis not present

## 2017-03-21 ENCOUNTER — Telehealth: Payer: Self-pay | Admitting: *Deleted

## 2017-03-21 DIAGNOSIS — J320 Chronic maxillary sinusitis: Secondary | ICD-10-CM | POA: Diagnosis not present

## 2017-03-21 DIAGNOSIS — H7012 Chronic mastoiditis, left ear: Secondary | ICD-10-CM | POA: Diagnosis not present

## 2017-03-21 DIAGNOSIS — H6122 Impacted cerumen, left ear: Secondary | ICD-10-CM | POA: Diagnosis not present

## 2017-03-21 DIAGNOSIS — J322 Chronic ethmoidal sinusitis: Secondary | ICD-10-CM | POA: Diagnosis not present

## 2017-03-21 DIAGNOSIS — J040 Acute laryngitis: Secondary | ICD-10-CM | POA: Diagnosis not present

## 2017-03-21 NOTE — Telephone Encounter (Addendum)
Called patient and discussed all result notes from Dr. Jaynee Eagles. He verbalized understanding and said he had seen the report online already. He saw Dr. Ernesto Rutherford today. He reports that per Dr. Ernesto Rutherford, everything looks good, continue with antibiotics and everything will be ok. Also, he pulled the fluid out of the patient's ear at the visit today.     ----- Message from Melvenia Beam, MD sent at 03/18/2017 11:27 AM EST ----- Let patient know the CT temporal bones shows fluid Fluid in the external auditory canal adjacent to the tympanic membrane, the mastoid cells are clear and otherwise no obstruction of bone or other pathology which is good news. Will make referral to Dr. Ernesto Rutherford thanks

## 2017-03-21 NOTE — Telephone Encounter (Signed)
Spoke to patient, he feels better

## 2017-03-22 ENCOUNTER — Encounter: Payer: Self-pay | Admitting: Family Medicine

## 2017-03-22 ENCOUNTER — Ambulatory Visit (INDEPENDENT_AMBULATORY_CARE_PROVIDER_SITE_OTHER): Payer: 59 | Admitting: Family Medicine

## 2017-03-22 VITALS — BP 148/78 | HR 69 | Temp 97.7°F | Ht 68.0 in | Wt 188.8 lb

## 2017-03-22 DIAGNOSIS — R7989 Other specified abnormal findings of blood chemistry: Secondary | ICD-10-CM

## 2017-03-22 DIAGNOSIS — E279 Disorder of adrenal gland, unspecified: Secondary | ICD-10-CM

## 2017-03-22 DIAGNOSIS — I10 Essential (primary) hypertension: Secondary | ICD-10-CM | POA: Diagnosis not present

## 2017-03-22 LAB — BASIC METABOLIC PANEL
BUN: 19 mg/dL (ref 6–23)
CHLORIDE: 98 meq/L (ref 96–112)
CO2: 29 meq/L (ref 19–32)
Calcium: 9.7 mg/dL (ref 8.4–10.5)
Creatinine, Ser: 1.11 mg/dL (ref 0.40–1.50)
GFR: 70.26 mL/min (ref >60.00)
GLUCOSE: 106 mg/dL — AB (ref 70–99)
POTASSIUM: 3.6 meq/L (ref 3.5–5.1)
SODIUM: 136 meq/L (ref 135–145)

## 2017-03-22 NOTE — Progress Notes (Addendum)
Subjective:  [name] Page is a 66 y.o. year old very pleasant male patient who presents for/with See problem oriented charting ROS- no chest pain. No shortness of breath. Headaches have improved. No edema.    Past Medical History-  Patient Active Problem List   Diagnosis Date Noted  . Chronic pain syndrome 08/31/2016    Priority: High  . History of lung cancer 12/11/2012    Priority: High  . Generalized anxiety disorder 03/12/2014    Priority: Medium  . Essential hypertension 03/12/2014    Priority: Medium  . Hereditary and idiopathic peripheral neuropathy 12/22/2012    Priority: Medium  . Dyspnea 07/14/2012    Priority: Medium  . Hyperlipidemia 07/14/2012    Priority: Medium  . Depression 02/16/2010    Priority: Medium  . Low testosterone 11/21/2007    Priority: Medium  . Eustachian tube dysfunction 12/17/2014    Priority: Low  . Nonspecific abnormal electrocardiogram (ECG) (EKG) 07/14/2012    Priority: Low  . Allergic reaction, history of 10/25/2011    Priority: Low  . Rectal or anal pain 10/01/2010    Priority: Low  . Allergic rhinitis 06/13/2007    Priority: Low  . GERD 06/13/2007    Priority: Low  . IBS (irritable bowel syndrome) 06/13/2007    Priority: Low  . Palpitations 08/11/2016  . 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 Peroneal tendonitis of right lower extremity 05/08/2014  . SI (sacroiliac) joint dysfunction 09/19/2013  . Nonallopathic lesion of sacral region 09/19/2013  . Nonallopathic lesion of thoracic region 09/19/2013  . Nonallopathic lesion of lumbosacral region 09/19/2013  . Right foot pain 12/22/2012    Medications- reviewed and updated Current Outpatient Medications  Medication Sig Dispense Refill  . amoxicillin-clavulanate (AUGMENTIN) 875-125 MG tablet Take 1 tablet by mouth 2 (two) times daily. 28 tablet 0  . b complex vitamins tablet Take 1 tablet by mouth daily.      Marland Kitchen buPROPion (WELLBUTRIN XL) 300 MG 24 hr tablet TAKE 1 TABLET BY  MOUTH EVERY DAY 90 tablet 3  . chlorthalidone (HYGROTON) 25 MG tablet Take 0.5-1 tablets (12.5-25 mg total) by mouth daily. 30 tablet 3  . Cholecalciferol (VITAMIN D) 2000 UNITS CAPS Take 1 capsule by mouth daily.     . clonazePAM (KLONOPIN) 0.5 MG tablet TAKE 1 TABLET BY MOUTH TWICE DAILY AS NEEDED FOR ANXIETY 60 tablet 5  . Coenzyme Q10 (CO Q 10 PO) Take 1 capsule by mouth daily.    [name], [name], (DAMIANA LEAF PO) Take 700 mg by mouth daily.    Marland Kitchen DHEA 25 MG CAPS Take 1 capsule by mouth.    . EPINEPHrine (EPIPEN 2-PAK) 0.3 mg/0.3 mL IJ SOAJ injection USE AS NEEDED 2 Device 1  . gabapentin (NEURONTIN) 300 MG capsule TAKE 1 TO 2 CAPSULES BY MOUTH AT BEDTIME 180 capsule 1  . HYDROcodone-acetaminophen (NORCO/VICODIN) 5-325 MG tablet Take 0.5-1 tablets by mouth 2 (two) times daily as needed (No more than 1-2x a week use.). 52 tablet 0  . Insulin Pen Needle 29G X 35.3IR MISC 1 application by Does not apply route daily. 100 each 3  . MAGNESIUM PO Take 1 capsule by mouth daily.    Marland Kitchen NEEDLE, DISP, 23 G 23G X 1-1/2" MISC Use as directed 100 each 3  . Omega-3 Fatty Acids (FISH OIL) 1000 MG CAPS Take 1 capsule by mouth 2 (two) times daily.     . Saw Palmetto, Serenoa repens, (SAW PALMETTO PO) Take 1 capsule by  mouth 2 (two) times daily.    . SYRINGE-NEEDLE, DISP, 3 ML 22G X 1-1/2" 3 ML MISC Use as directed. 100 each 3  . testosterone cypionate (DEPOTESTOSTERONE CYPIONATE) 200 MG/ML injection INJECT 1 ML INTO THE MUSCLE EVERY 14 DAYS 10 mL 5  . diltiazem (CARDIZEM CD) 180 MG 24 hr capsule Take 1 capsule (180 mg total) by mouth daily. 90 capsule 3   No current facility-administered medications for this visit.    Facility-Administered Medications Ordered in Other Visits  Medication Dose Route Frequency Provider Last Rate Last Dose  . gadopentetate dimeglumine (MAGNEVIST) injection 20 mL  20 mL Intravenous Once PRN Melvenia Beam, MD        Objective: BP (!) 148/78 (BP Location: Left Arm,  Patient Position: Sitting, Cuff Size: Large)   Pulse 69   Temp 97.7 F (36.5 C) (Oral)   Ht 5\' 8"  (1.727 m)   Wt 188 lb 12.8 oz (85.6 kg)   SpO2 95%   BMI 28.71 kg/m  Gen: NAD, resting comfortably CV: RRR no murmurs rubs or gallops Lungs: CTAB no crackles, wheeze, rhonchi Ext: no edema Skin: warm, dry  Assessment/Plan:  Essential hypertension S: slightly improved control on  chlorthalidone 25mg  (new addition- he self titrated from 12.5mg ), diltiazem 180mg  XR. Didn't tolerate amlodipine due to edema.    Home #s are about 50% of the time in 502D/741O with all diastolic controlled. Other 50% usually in 140s and rarely into 150s.  BP Readings from Last 3 Encounters:  03/22/17 (!) 148/78. Home cuff 10 points higher  03/07/17 (!) 152/78  03/02/17 (!) 160/83  A/P: We discussed blood pressure goal of <140/90. Continue current meds:  Suspect with adjustment for home cuff- he is actually controlled. He prefers not to add additional meds. In addition on my repeat got 136/82 so appears controlled. Home cuff on that check was still running higher- actually in 160s  He wants to get another bmet in 1 month to check potassium in addition to today. Also asked him to let me know how home #s looking at that time by mychart.   Perhaps ace-I or ARB as next step  Low testosterone S: referred to endocrine last visit. He is down to 100mg  testosterone every 2 weeks - down from 200mg - he has felt ok with decrease- due for 2nd lower dose soon A/P: does not appear referral has been processed- will reach out to scheduling team. Continue lower dose as tolerating  Adrenal abnormality DHEA repleted by Dr. Tamala Julian  Future Appointments  Date Time Provider [place]  04/13/2017  8:00 AM Narda Amber K, DO [facility] None  06/28/2017  8:15 AM Yong Channel Brayton Mars, MD [facility]   Orders Placed This Encounter  Procedures  . Basic metabolic panel    [place]  . Basic metabolic panel    Standing Status:    Future    Standing Expiration Date:   03/22/2018   The duration of face-to-face time during this visit was greater than 15 minutes. Greater than 50% of this time was spent in counseling, explanation of diagnosis, planning of further management, and/or coordination of care including discussing BP goals, home monitoring, reviewing home readings, discussing medication options.   Return precautions advised.  [name], MD

## 2017-03-22 NOTE — Patient Instructions (Addendum)
bmet today  Continue chlorthalidone 25mg   Looks like home readings perhaps about 10 points higher so lets target <150/90 on home readings- if consistently above that see me sooner, otherwise keep appointment in march  Repeat bmet 1 month

## 2017-03-22 NOTE — Assessment & Plan Note (Signed)
DHEA repleted by Dr. Tamala Julian

## 2017-03-22 NOTE — Assessment & Plan Note (Addendum)
S: slightly improved control on  chlorthalidone 25mg  (new addition- he self titrated from 12.5mg ), diltiazem 180mg  XR. Didn't tolerate amlodipine due to edema.    Home #s are about 50% of the time in 798X/211H with all diastolic controlled. Other 50% usually in 140s and rarely into 150s.  BP Readings from Last 3 Encounters:  03/22/17 (!) 148/78. Home cuff 10 points higher  03/07/17 (!) 152/78  03/02/17 (!) 160/83  A/P: We discussed blood pressure goal of <140/90. Continue current meds:  Suspect with adjustment for home cuff- he is actually controlled. He prefers not to add additional meds. In addition on my repeat got 136/82 so appears controlled. Home cuff on that check was still running higher- actually in 160s  He wants to get another bmet in 1 month to check potassium in addition to today. Also asked him to let me know how home #s looking at that time by mychart.   Perhaps ace-I or ARB as next step

## 2017-03-22 NOTE — Assessment & Plan Note (Signed)
S: referred to endocrine last visit. He is down to 100mg  testosterone every 2 weeks - down from 200mg - he has felt ok with decrease- due for 2nd lower dose soon A/P: does not appear referral has been processed- will reach out to scheduling team. Continue lower dose as tolerating

## 2017-03-24 MED FILL — TRIAMCINOLONE ACETONIDE 0.1: 0.1 | 20 days supply | Qty: 80 | Fill #0

## 2017-03-25 ENCOUNTER — Ambulatory Visit: Payer: Self-pay | Admitting: Endocrinology

## 2017-03-31 ENCOUNTER — Other Ambulatory Visit (INDEPENDENT_AMBULATORY_CARE_PROVIDER_SITE_OTHER): Payer: 59

## 2017-03-31 ENCOUNTER — Encounter: Payer: Self-pay | Admitting: Family Medicine

## 2017-03-31 ENCOUNTER — Other Ambulatory Visit: Payer: Self-pay | Admitting: Family Medicine

## 2017-03-31 DIAGNOSIS — I10 Essential (primary) hypertension: Secondary | ICD-10-CM

## 2017-03-31 LAB — BASIC METABOLIC PANEL
BUN: 18 mg/dL (ref 6–23)
CHLORIDE: 99 meq/L (ref 96–112)
CO2: 27 meq/L (ref 19–32)
Calcium: 9.6 mg/dL (ref 8.4–10.5)
Creatinine, Ser: 1.14 mg/dL (ref 0.40–1.50)
GFR: 68.13 mL/min (ref >60.00)
GLUCOSE: 140 mg/dL — AB (ref 70–99)
POTASSIUM: 3.4 meq/L — AB (ref 3.5–5.1)
Sodium: 135 mEq/L (ref 135–145)

## 2017-03-31 MED ORDER — POTASSIUM CHLORIDE CRYS ER 10 MEQ PO TBCR: 10.0000 meq | EXTENDED_RELEASE_TABLET | Freq: Every day | ORAL | 3 refills | Status: DC

## 2017-03-31 MED FILL — POTASSIUM CL ER 10 MEQ TABL: 10 | 90 days supply | Qty: 90 | Fill #0

## 2017-04-01 ENCOUNTER — Other Ambulatory Visit: Payer: Self-pay

## 2017-04-01 DIAGNOSIS — E876 Hypokalemia: Secondary | ICD-10-CM

## 2017-04-07 ENCOUNTER — Ambulatory Visit: Payer: 59 | Admitting: Endocrinology

## 2017-04-07 ENCOUNTER — Encounter: Payer: Self-pay | Admitting: Endocrinology

## 2017-04-07 VITALS — BP 143/88 | HR 71 | Ht 68.0 in | Wt 191.0 lb

## 2017-04-07 DIAGNOSIS — R5383 Other fatigue: Secondary | ICD-10-CM

## 2017-04-07 DIAGNOSIS — E291 Testicular hypofunction: Secondary | ICD-10-CM

## 2017-04-07 DIAGNOSIS — Z125 Encounter for screening for malignant neoplasm of prostate: Secondary | ICD-10-CM

## 2017-04-07 DIAGNOSIS — R7301 Impaired fasting glucose: Secondary | ICD-10-CM

## 2017-04-07 LAB — CBC WITH DIFFERENTIAL/PLATELET
BASOS ABS: 0.1 10*3/uL (ref 0.0–0.1)
Basophils Relative: 0.7 % (ref 0.0–3.0)
EOS PCT: 2.1 % (ref 0.0–5.0)
Eosinophils Absolute: 0.2 10*3/uL (ref 0.0–0.7)
HCT: 54 % — ABNORMAL HIGH (ref 39.0–52.0)
Hemoglobin: 18.3 g/dL (ref 13.0–17.0)
LYMPHS ABS: 2.6 10*3/uL (ref 0.7–4.0)
Lymphocytes Relative: 26.3 % (ref 12.0–46.0)
MCHC: 33.9 g/dL (ref 30.0–36.0)
MCV: 94.6 fl (ref 78.0–100.0)
Monocytes Absolute: 1.2 10*3/uL — ABNORMAL HIGH (ref 0.1–1.0)
Monocytes Relative: 12 % (ref 3.0–12.0)
NEUTROS ABS: 5.8 10*3/uL (ref 1.4–7.7)
Neutrophils Relative %: 58.9 % (ref 43.0–77.0)
PLATELETS: 267 10*3/uL (ref 150.0–400.0)
RBC: 5.71 Mil/uL (ref 4.22–5.81)
RDW: 12.4 % (ref 11.5–15.5)
WBC: 9.9 10*3/uL (ref 4.0–10.5)

## 2017-04-07 LAB — TESTOSTERONE: TESTOSTERONE: 312.95 ng/dL (ref 300.00–890.00)

## 2017-04-07 LAB — PSA, MEDICARE: PSA: 0.31 ng/ml (ref 0.10–4.00)

## 2017-04-07 NOTE — Progress Notes (Addendum)
Patient ID: [name] Page, male   DOB: Mar 29, 1951, 67 y.o.   MRN: [id number]           Reason for consultation: Management of low testosterone   Chief complaint: Tiredness  History of Present Illness  The patient has been referred by doctor [name] Page  Hypogonadismwas diagnosed in ?  2012  He at that time had complaints offatigue, some increased depression, decreased libido although he does not remember symptoms well There is no history of the following: Hot flushes, sweats, breast enlargement, long term anabolic steroid use, history of testicular injury, head injury or mumps in childhood. No history of osteopenia or low impact fracture  At that time an afternoon testosterone level was 214 and free testosterone level low at 32, normal >47   He was also evaluated by a urologist and details are not available He was treated with testosterone gel but he thinks this did not improve his testosterone levels and likely did not continue treatment  Subsequently he was again evaluated in 2015 and with a low testosterone of 239 he was started on testosterone injections With this he thinks she had some improvement in his fatigue and depression His dose had been adjusted from his initial starting dose of 100 mg every 2 weeks and he has been on various regimens including every 3 or 4 weeks injections He does injection himself and more recently has been taking 100 mg so testosterone cypionate every 2 weeks.  He does the injection in his thigh His testosterone level has been quite variable over the last 2-3 years Most recently is level was low in September his testosterone was checked about a week after his injection  Currently the patient feels that he has variable energy level and it may take 5-7 days for his energy level to improve after his injection Recently he generally feels fairly good  He is now referred for further management    Prior lab results showtestosterone levels  of:   Lab Results  Component Value Date   TESTOSTERONE 186.13 (L) 12/28/2016   TESTOSTERONE 648 03/18/2016   TESTOSTERONE 348 01/12/2016   TESTOSTERONE 721 01/17/2015    Prolactin level: Non-available   No results found for: LH     He has not had any other evaluation for pituitary gland dysfunction except for previous evaluation of free T4   Lab Results  Component Value Date   TSH 2.180 09/21/2016   TSH 2.44 05/27/2014   TSH 1.56 01/12/2013   FREET4 0.75 01/12/2013   FREET4 0.70 08/24/2010     Allergies as of 04/07/2017      Reactions   Bee Venom Anaphylaxis   Flagyl [metronidazole Hcl]    Nsaids    REACTION: intolereance      Medication List        Accurate as of 04/07/17  4:34 PM. Always use your most recent med list.          aspirin EC 81 MG tablet Take 81 mg by mouth daily.   b complex vitamins tablet Take 1 tablet by mouth daily.   buPROPion 300 MG 24 hr tablet Commonly known as:  WELLBUTRIN XL TAKE 1 TABLET BY MOUTH EVERY DAY   chlorthalidone 25 MG tablet Commonly known as:  HYGROTON Take 0.5-1 tablets (12.5-25 mg total) by mouth daily.   clonazePAM 0.5 MG tablet Commonly known as:  KLONOPIN TAKE 1 TABLET BY MOUTH TWICE DAILY AS NEEDED FOR ANXIETY   CO Q 10 PO Take  1 capsule by mouth daily.   DAMIANA LEAF PO Take 700 mg by mouth daily.   DHEA 25 MG Caps Take 1 capsule by mouth.   diltiazem 180 MG 24 hr capsule Commonly known as:  CARDIZEM CD Take 1 capsule (180 mg total) by mouth daily.   EPINEPHrine 0.3 mg/0.3 mL Soaj injection Commonly known as:  EPIPEN 2-PAK USE AS NEEDED   Fish Oil 1000 MG Caps Take 1 capsule by mouth 2 (two) times daily.   gabapentin 300 MG capsule Commonly known as:  NEURONTIN TAKE 1 TO 2 CAPSULES BY MOUTH AT BEDTIME   HYDROcodone-acetaminophen 5-325 MG tablet Commonly known as:  NORCO/VICODIN Take 0.5-1 tablets by mouth 2 (two) times daily as needed (No more than 1-2x a week use.).   Insulin  Pen Needle 29G X 60.6TK Misc 1 application by Does not apply route daily.   MAGNESIUM PO Take 1 capsule by mouth daily.   NEEDLE (DISP) 23 G 23G X 1-1/2" Misc Use as directed   potassium chloride 10 MEQ tablet Commonly known as:  K-DUR,KLOR-CON Take 1 tablet (10 mEq total) by mouth daily.   SAW PALMETTO PO Take 1 capsule by mouth 2 (two) times daily.   SYRINGE-NEEDLE (DISP) 3 ML 22G X 1-1/2" 3 ML Misc Use as directed.   testosterone cypionate 200 MG/ML injection Commonly known as:  DEPOTESTOSTERONE CYPIONATE INJECT 1 ML INTO THE MUSCLE EVERY 14 DAYS   Vitamin D 2000 units Caps Take 1 capsule by mouth daily.       Allergies:  Allergies  Allergen Reactions  . Bee Venom Anaphylaxis  . Flagyl [Metronidazole Hcl]   . Nsaids     REACTION: intolereance    Past Medical History:  Diagnosis Date  . Allergic rhinitis, cause unspecified   . 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 Depressive disorder, not elsewhere classified   . Esophageal reflux   . HTN (hypertension)   . Lymphocytic colitis   . Neuropathy   . Other testicular hypofunction   . Personal history of urinary calculi     Past Surgical History:  Procedure Laterality Date  . 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 parotid tumor surgery    . TONSILLECTOMY AND ADENOIDECTOMY      Family History  Problem Relation Age of Onset  . Hypertension Mother   . Anxiety disorder Mother   . Non-Hodgkin's lymphoma Mother   . Dementia Father   . Depression Father        and anxiety  . Hypertension Father   . Sudden death Paternal Uncle 68  . Hypertension Brother   . Early death Neg Hx   . Heart disease Neg Hx        mother in 42s had lesoin    Social History:  reports that  has never smoked. he has never used smokeless tobacco. He reports that he drinks alcohol. He reports that he does not use drugs.  Review of Systems  Constitutional: Negative for weight loss  and reduced appetite.  HENT: Negative for headaches.        No history of excessive snoring  Eyes: Negative for blurred vision.  Respiratory: Negative for daytime sleepiness and wheezing.   Cardiovascular: Negative for chest pain and leg swelling.  Gastrointestinal: Negative for nausea and abdominal pain.  Endocrine: Positive for fatigue. Negative for cold intolerance.  Genitourinary: Negative for slow stream.  Musculoskeletal: Negative for joint pain.  Skin: Negative for rash.  Neurological: Positive for balance difficulty.       He has had idiopathic neuropathy with some degree of numbness, discomfort in his feet and some burning  Psychiatric/Behavioral:       Has had history of depression, managed by PCP      General Examination:   BP (!) 143/88   Pulse 71   Ht 5\' 8"  (1.727 m)   Wt 191 lb (86.6 kg)   SpO2 96%   BMI 29.04 kg/m   GENERAL APPEARANCE:  averagely built and nourished  SKIN:normal, no rash or pigmentation.  HEENT:Oral mucosa, tongue and pharynx normal.   EYES:normal external appearance of eyes, Fundii benign discs and vessels.   NECK:no lymphadenopathy, no thyromegaly.   CHEST: Gynecomastia absent on the right and minimal on the left LUNGS:clear to auscultation bilaterally, no wheezes, rhonchi, rales.   HEART:normal S1 And S2, no S3, S4, murmur or click.  ABDOMEN:no hepatosplenomegaly, no masses palpated, soft and not tender.   MALE GENITOURINARY:left testicle 1.5-2 cm, very soft and right testicle about 2-2.5 cm.  Relatively soft   MUSCULOSKELETALNo enlargement or deformity of joints.  EXTREMITIES:no clubbing, no edema.  NEUROLOGIC EXAM: Biceps reflexes normal (2+) bilaterally.   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 Although he may have primary hypogonadism not clear if he could also have insulin resistance syndrome causing  hypogonadotropic hypogonadism No labs available from his initial diagnosis which was partly done by urologist  Subsequently he has had fairly good symptomatic relief of his baseline symptoms of fatigue, depression and decreased libido with testosterone supplementation although inconsistent Also his testosterone levels have been quite variable but not clear at what point of time his levels were done in relationship to his injection  Although patient was recommended a trial of transdermal testosterone preparations he prefers not to do this because of the inconvenience and wants to continue injection His last injection was just over 2 weeks ago  Plan:  Check testosterone level today and decide on dosage of his injections  Will also get baseline levels of LH and prolactin today  Since he has had a high normal hemoglobin previously this will need to be rechecked today  Also needs follow-up TSH with ongoing testosterone supplementation  Evaluate for any progression of his mildly abnormal glucose with A1c  Since he needs follow-up potassium for his PCP will draw his chemistry panel today also  Most likely will recommend weekly injections of testosterone and follow-up level in 6 weeks  Discussed that we will try to keep his testosterone levels in the 500 range, to be checked about 3-4 days after his weekly injections  A copy of the consultation note has been sent to the referring physician   Elayne Snare 04/07/2017, 4:34 PM   Hemoglobin is 18.3.  Testosterone level is over 300 as a trough level, will need to reduce dosage Recommend doing 75 mg weekly for a total monthly dose of 300 mg instead of 400 mg with follow-up CBC on next visit Blood test for testosterone should be done 4 days after the injection   Note: This office note was prepared with Estate agent. Any transcriptional errors that result from this process are unintentional.

## 2017-04-07 NOTE — Patient Instructions (Signed)
Stop DHEAS

## 2017-04-08 ENCOUNTER — Encounter: Payer: Self-pay | Admitting: Endocrinology

## 2017-04-08 LAB — BASIC METABOLIC PANEL
BUN: 20 mg/dL (ref 6–23)
CALCIUM: 9.8 mg/dL (ref 8.4–10.5)
CO2: 29 mEq/L (ref 19–32)
Chloride: 98 mEq/L (ref 96–112)
Creatinine, Ser: 1.25 mg/dL (ref 0.40–1.50)
GFR: 61.25 mL/min (ref >60.00)
GLUCOSE: 93 mg/dL (ref 70–99)
Potassium: 3.8 mEq/L (ref 3.5–5.1)
Sodium: 139 mEq/L (ref 135–145)

## 2017-04-08 LAB — PROLACTIN: Prolactin: 11.8 ng/mL (ref 4.0–15.2)

## 2017-04-08 LAB — HEMOGLOBIN A1C: Hgb A1c MFr Bld: 5.6 % (ref 4.6–6.5)

## 2017-04-08 LAB — LUTEINIZING HORMONE: LH: <0.2 m[IU]/mL — ABNORMAL LOW (ref 1.50–9.30)

## 2017-04-08 MED ORDER — TESTOSTERONE CYPIONATE 100 MG/ML IM SOLN: 75.0000 mg | INTRAMUSCULAR | 3 refills | Status: DC

## 2017-04-08 NOTE — Addendum Note (Signed)
Addended by: Elayne Snare on: 04/08/2017 12:10 PM   Modules accepted: Orders

## 2017-04-09 ENCOUNTER — Encounter: Payer: Self-pay | Admitting: Family Medicine

## 2017-04-10 MED FILL — clonazePAM 0.5 MG TABS: 0.5 | 30 days supply | Qty: 60 | Fill #1

## 2017-04-10 MED FILL — CHLORTHALIDONE 25 MG TAB: 25 | 30 days supply | Qty: 30 | Fill #1

## 2017-04-12 ENCOUNTER — Other Ambulatory Visit: Payer: Self-pay

## 2017-04-12 DIAGNOSIS — E876 Hypokalemia: Secondary | ICD-10-CM

## 2017-04-12 DIAGNOSIS — D751 Secondary polycythemia: Secondary | ICD-10-CM

## 2017-04-13 ENCOUNTER — Ambulatory Visit: Payer: Self-pay | Admitting: Neurology

## 2017-04-14 ENCOUNTER — Encounter: Payer: Self-pay | Admitting: Family Medicine

## 2017-04-14 ENCOUNTER — Other Ambulatory Visit (INDEPENDENT_AMBULATORY_CARE_PROVIDER_SITE_OTHER): Payer: 59

## 2017-04-14 DIAGNOSIS — E876 Hypokalemia: Secondary | ICD-10-CM | POA: Diagnosis not present

## 2017-04-14 DIAGNOSIS — D751 Secondary polycythemia: Secondary | ICD-10-CM | POA: Diagnosis not present

## 2017-04-14 LAB — CBC
HEMATOCRIT: 51.6 % (ref 39.0–52.0)
Hemoglobin: 17.9 g/dL — ABNORMAL HIGH (ref 13.0–17.0)
MCHC: 34.7 g/dL (ref 30.0–36.0)
MCV: 94.3 fl (ref 78.0–100.0)
Platelets: 247 10*3/uL (ref 150.0–400.0)
RBC: 5.47 Mil/uL (ref 4.22–5.81)
RDW: 12.3 % (ref 11.5–15.5)
WBC: 6.7 10*3/uL (ref 4.0–10.5)

## 2017-04-14 LAB — MAGNESIUM: Magnesium: 2.2 mg/dL (ref 1.5–2.5)

## 2017-04-15 ENCOUNTER — Other Ambulatory Visit (INDEPENDENT_AMBULATORY_CARE_PROVIDER_SITE_OTHER): Payer: 59

## 2017-04-15 DIAGNOSIS — E876 Hypokalemia: Secondary | ICD-10-CM

## 2017-04-15 LAB — BASIC METABOLIC PANEL
BUN: 18 mg/dL (ref 6–23)
CHLORIDE: 94 meq/L — AB (ref 96–112)
CO2: 31 mEq/L (ref 19–32)
Calcium: 9.5 mg/dL (ref 8.4–10.5)
Creatinine, Ser: 1.26 mg/dL (ref 0.40–1.50)
GFR: 60.69 mL/min (ref >60.00)
GLUCOSE: 132 mg/dL — AB (ref 70–99)
POTASSIUM: 3.6 meq/L (ref 3.5–5.1)
Sodium: 137 mEq/L (ref 135–145)

## 2017-04-20 MED FILL — TESTOSTERONE CYP 200 MG/ML: 200 | 28 days supply | Qty: 2 | Fill #1

## 2017-04-28 ENCOUNTER — Encounter: Payer: Self-pay | Admitting: Family Medicine

## 2017-04-28 DIAGNOSIS — R7989 Other specified abnormal findings of blood chemistry: Secondary | ICD-10-CM

## 2017-05-04 ENCOUNTER — Encounter: Payer: Self-pay | Admitting: Family Medicine

## 2017-05-04 ENCOUNTER — Other Ambulatory Visit (INDEPENDENT_AMBULATORY_CARE_PROVIDER_SITE_OTHER): Payer: 59

## 2017-05-04 DIAGNOSIS — R7989 Other specified abnormal findings of blood chemistry: Secondary | ICD-10-CM | POA: Diagnosis not present

## 2017-05-04 LAB — CBC
HCT: 51.6 % (ref 39.0–52.0)
Hemoglobin: 17.8 g/dL — ABNORMAL HIGH (ref 13.0–17.0)
MCHC: 34.4 g/dL (ref 30.0–36.0)
MCV: 93 fl (ref 78.0–100.0)
Platelets: 251 K/uL (ref 150.0–400.0)
RBC: 5.55 Mil/uL (ref 4.22–5.81)
RDW: 12.3 % (ref 11.5–15.5)
WBC: 8.6 K/uL (ref 4.0–10.5)

## 2017-05-04 LAB — BASIC METABOLIC PANEL WITH GFR
BUN: 17 mg/dL (ref 6–23)
CO2: 31 meq/L (ref 19–32)
Calcium: 9.5 mg/dL (ref 8.4–10.5)
Chloride: 98 meq/L (ref 96–112)
Creatinine, Ser: 1.26 mg/dL (ref 0.40–1.50)
GFR: 60.68 mL/min
Glucose, Bld: 132 mg/dL — ABNORMAL HIGH (ref 70–99)
Potassium: 4 meq/L (ref 3.5–5.1)
Sodium: 139 meq/L (ref 135–145)

## 2017-05-04 LAB — TESTOSTERONE: Testosterone: 214.06 ng/dL — ABNORMAL LOW (ref 300.00–890.00)

## 2017-05-10 MED FILL — CHLORTHALIDONE 25 MG TAB: 25 | 30 days supply | Qty: 30 | Fill #2

## 2017-05-10 MED FILL — clonazePAM 0.5 MG TABS: 0.5 | 30 days supply | Qty: 60 | Fill #2

## 2017-05-16 NOTE — Progress Notes (Signed)
Patient ID: [name] Page, male   DOB: [birth year], 67 y.o.   MRN: [id number]           Reason for consultation: Management of low testosterone   Chief complaint: Tiredness  History of Present Illness    Hypogonadismwas diagnosed in ?  2012  He at that time had complaints offatigue, some increased depression, decreased libido although he does not remember symptoms well There is no history of the following: Hot flushes, sweats, breast enlargement, long term anabolic steroid use, history of testicular injury, head injury or mumps in childhood. No history of osteopenia or low impact fracture  At that time an afternoon testosterone level was 214 and free testosterone level low at 32, normal >47   He was also evaluated by a urologist and details are not available He was treated with testosterone gel but he thinks this did not improve his testosterone levels and likely did not continue treatment  Subsequently he was again evaluated in 2015 and with a low testosterone of 239 he was started on testosterone injections With this he thinks she had some improvement in his fatigue and depression His dose had been adjusted from his initial starting dose of 100 mg every 2 weeks and he has been on various regimens including every 3 or 4 weeks injections  RECENT history: He does injection himself and prior to his initial consultation had been taking 150 mg testosterone cypionate every 2 weeks.   He does the injection in his thigh His testosterone level has been quite variable over the last 2-3 years  Because of his increased hematocrit he was told to injection every week and try the 75 mg every week initially However on his own he decided to leave of the injections for some time and has only taken 2 injections of 50 mg of the last 2 Fridays Prior to this his testosterone level about 2 weeks ago was low at 214  Currently the patient feels that he has somewhat better energy level but he  thinks this is because of increased exercise and he is doing this for 30 minutes daily However he has decreased libido    Wt Readings from Last 3 Encounters:  05/17/17 187 lb 6.4 oz (85 kg)  04/07/17 191 lb (86.6 kg)  03/22/17 188 lb 12.8 oz (85.6 kg)      Prior lab results showtestosterone levels of:   Lab Results  Component Value Date   TESTOSTERONE 214.06 (L) 05/04/2017   TESTOSTERONE 312.95 04/07/2017   TESTOSTERONE 186.13 (L) 12/28/2016   TESTOSTERONE 648 03/18/2016    Prolactin level: Normal   Lab Results  Component Value Date   LH <0.20 (L) 04/07/2017   Lab Results  Component Value Date   HGB 17.8 (H) 05/04/2017        Allergies as of 05/17/2017      Reactions   Bee Venom Anaphylaxis   Flagyl [metronidazole Hcl]    Nsaids    REACTION: intolereance      Medication List        Accurate as of 05/17/17  8:27 AM. Always use your most recent med list.          aspirin EC 81 MG tablet Take 81 mg by mouth daily.   b complex vitamins tablet Take 1 tablet by mouth daily.   buPROPion 300 MG 24 hr tablet Commonly known as:  WELLBUTRIN XL TAKE 1 TABLET BY MOUTH EVERY DAY   chlorthalidone 25 MG tablet Commonly known  as:  HYGROTON Take 0.5-1 tablets (12.5-25 mg total) by mouth daily.   clonazePAM 0.5 MG tablet Commonly known as:  KLONOPIN TAKE 1 TABLET BY MOUTH TWICE DAILY AS NEEDED FOR ANXIETY   CO Q 10 PO Take 1 capsule by mouth daily.   DAMIANA LEAF PO Take 700 mg by mouth daily.   DHEA 25 MG Caps Take 1 capsule by mouth.   diltiazem 180 MG 24 hr capsule Commonly known as:  CARDIZEM CD Take 1 capsule (180 mg total) by mouth daily.   EPINEPHrine 0.3 mg/0.3 mL Soaj injection Commonly known as:  EPIPEN 2-PAK USE AS NEEDED   Fish Oil 1000 MG Caps Take 1 capsule by mouth 2 (two) times daily.   gabapentin 300 MG capsule Commonly known as:  NEURONTIN TAKE 1 TO 2 CAPSULES BY MOUTH AT BEDTIME   HYDROcodone-acetaminophen 5-325 MG  tablet Commonly known as:  NORCO/VICODIN Take 0.5-1 tablets by mouth 2 (two) times daily as needed (No more than 1-2x a week use.).   Insulin Pen Needle 29G X 03.8UE Misc 1 application by Does not apply route daily.   MAGNESIUM PO Take 1 capsule by mouth daily.   NEEDLE (DISP) 23 G 23G X 1-1/2" Misc Use as directed   potassium chloride 10 MEQ tablet Commonly known as:  K-DUR,KLOR-CON Take 1 tablet (10 mEq total) by mouth daily.   SAW PALMETTO PO Take 1 capsule by mouth 2 (two) times daily.   SYRINGE-NEEDLE (DISP) 3 ML 22G X 1-1/2" 3 ML Misc Use as directed.   testosterone cypionate 100 MG/ML injection Commonly known as:  DEPOTESTOTERONE CYPIONATE Inject 0.75 mLs (75 mg total) into the muscle once a week. For IM use only   Vitamin D 2000 units Caps Take 1 capsule by mouth daily.       Allergies:  Allergies  Allergen Reactions  . Bee Venom Anaphylaxis  . Flagyl [Metronidazole Hcl]   . Nsaids     REACTION: intolereance    Past Medical History:  Diagnosis Date  . Allergic rhinitis, cause unspecified   . 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 Depressive disorder, not elsewhere classified   . Esophageal reflux   . HTN (hypertension)   . Lymphocytic colitis   . Neuropathy   . Other testicular hypofunction   . Personal history of urinary calculi     Past Surgical History:  Procedure Laterality Date  . 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 parotid tumor surgery    . TONSILLECTOMY AND ADENOIDECTOMY      Family History  Problem Relation Age of Onset  . Hypertension Mother   . Anxiety disorder Mother   . Non-Hodgkin's lymphoma Mother   . Dementia Father   . Depression Father        and anxiety  . Hypertension Father   . Sudden death Paternal Uncle 70  . Hypertension Brother   . Early death Neg Hx   . Heart disease Neg Hx        mother in 21s had lesoin    Social History:  reports that  has never  smoked. he has never used smokeless tobacco. He reports that he drinks alcohol. He reports that he does not use drugs.  Review of Systems   No history of thyroid disease  Lab Results  Component Value Date   TSH 2.180 09/21/2016   TSH 2.44 05/27/2014   TSH 1.56 01/12/2013  FREET4 0.75 01/12/2013   FREET4 0.70 08/24/2010   IMPAIRED fasting glucose:  Although his A1c is quite normal his last fasting glucose was high Only recently has started exercising regularly  Wt Readings from Last 3 Encounters:  05/17/17 187 lb 6.4 oz (85 kg)  04/07/17 191 lb (86.6 kg)  03/22/17 188 lb 12.8 oz (85.6 kg)   Lab Results  Component Value Date   GLUCOSE 132 (H) 05/04/2017   GLUCOSE 132 (H) 04/15/2017   GLUCOSE 93 04/07/2017   Lab Results  Component Value Date   HGBA1C 5.6 04/07/2017   HGBA1C 5.5 12/28/2016   HGBA1C 5.7 06/17/2015   Lab Results  Component Value Date   LDLCALC 93 12/28/2016   CREATININE 1.26 05/04/2017    General Examination:   BP 134/86 (BP Location: Left Arm, Patient Position: Sitting, Cuff Size: Normal)   Pulse 65   Ht 5\' 8"  (1.727 m)   Wt 187 lb 6.4 oz (85 kg)   SpO2 96%   BMI 28.49 kg/m    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  Recheck hematocrit and liver functions as well as repeat fasting glucose on the next visit  Consider 2-hour glucose tolerance test fasting reading continues to be  high   Elayne Snare 05/17/2017, 8:27 AM     Note: This office note was prepared with Dragon voice recognition system technology. Any transcriptional errors that result from this process are unintentional.

## 2017-05-17 ENCOUNTER — Ambulatory Visit: Payer: 59 | Admitting: Endocrinology

## 2017-05-17 ENCOUNTER — Encounter: Payer: Self-pay | Admitting: Endocrinology

## 2017-05-17 VITALS — BP 134/86 | HR 65 | Ht 68.0 in | Wt 187.4 lb

## 2017-05-17 DIAGNOSIS — E291 Testicular hypofunction: Secondary | ICD-10-CM

## 2017-05-17 DIAGNOSIS — R5383 Other fatigue: Secondary | ICD-10-CM | POA: Diagnosis not present

## 2017-05-17 DIAGNOSIS — R7301 Impaired fasting glucose: Secondary | ICD-10-CM | POA: Diagnosis not present

## 2017-05-23 ENCOUNTER — Encounter: Payer: Self-pay | Admitting: Endocrinology

## 2017-05-23 ENCOUNTER — Encounter: Payer: Self-pay | Admitting: Family Medicine

## 2017-05-24 ENCOUNTER — Other Ambulatory Visit: Payer: Self-pay

## 2017-05-24 MED ORDER — POTASSIUM CHLORIDE CRYS ER 10 MEQ PO TBCR: EXTENDED_RELEASE_TABLET | ORAL | 5 refills | Status: DC

## 2017-05-24 MED FILL — POTASSIUM CL ER 10 MEQ TABL: 10 | 30 days supply | Qty: 60 | Fill #0

## 2017-05-27 MED FILL — BUPROPION HCL XL 300 MG TAB: 300 | 90 days supply | Qty: 90 | Fill #3

## 2017-05-27 MED FILL — TESTOSTERONE CYP 200 MG/ML: 200 | 28 days supply | Qty: 2 | Fill #2

## 2017-05-31 ENCOUNTER — Other Ambulatory Visit (INDEPENDENT_AMBULATORY_CARE_PROVIDER_SITE_OTHER): Payer: 59

## 2017-05-31 DIAGNOSIS — R7301 Impaired fasting glucose: Secondary | ICD-10-CM

## 2017-05-31 DIAGNOSIS — E291 Testicular hypofunction: Secondary | ICD-10-CM | POA: Diagnosis not present

## 2017-05-31 DIAGNOSIS — R5383 Other fatigue: Secondary | ICD-10-CM

## 2017-05-31 LAB — T4, FREE: Free T4: 0.62 ng/dL (ref 0.60–1.60)

## 2017-05-31 LAB — CBC
HEMATOCRIT: 49.7 % (ref 39.0–52.0)
HEMOGLOBIN: 17.5 g/dL — AB (ref 13.0–17.0)
MCHC: 35.2 g/dL (ref 30.0–36.0)
MCV: 93 fl (ref 78.0–100.0)
PLATELETS: 232 10*3/uL (ref 150.0–400.0)
RBC: 5.34 Mil/uL (ref 4.22–5.81)
RDW: 12.7 % (ref 11.5–15.5)
WBC: 5.8 10*3/uL (ref 4.0–10.5)

## 2017-05-31 LAB — GLUCOSE, RANDOM: Glucose, Bld: 127 mg/dL — ABNORMAL HIGH (ref 70–99)

## 2017-06-03 ENCOUNTER — Encounter: Payer: Self-pay | Admitting: Endocrinology

## 2017-06-03 LAB — TESTOSTERONE, FREE, TOTAL, SHBG
Sex Hormone Binding: 41.1 nmol/L (ref 19.3–76.4)
TESTOSTERONE FREE: 16.9 pg/mL (ref 6.6–18.1)
Testosterone: 823 ng/dL (ref 264–916)

## 2017-06-06 MED FILL — clonazePAM 0.5 MG TABS: 0.5 | 30 days supply | Qty: 60 | Fill #3

## 2017-06-06 MED FILL — CHLORTHALIDONE 25 MG TAB: 25 | 30 days supply | Qty: 30 | Fill #3

## 2017-06-09 MED FILL — DILTIAZEM 24HR ER 180 MG CA: 180 | 90 days supply | Qty: 90 | Fill #3

## 2017-06-13 ENCOUNTER — Other Ambulatory Visit: Payer: Self-pay | Admitting: Endocrinology

## 2017-06-13 ENCOUNTER — Encounter: Payer: Self-pay | Admitting: Endocrinology

## 2017-06-13 DIAGNOSIS — R7301 Impaired fasting glucose: Secondary | ICD-10-CM

## 2017-06-13 DIAGNOSIS — E291 Testicular hypofunction: Secondary | ICD-10-CM

## 2017-06-18 ENCOUNTER — Other Ambulatory Visit: Payer: Self-pay | Admitting: Neurology

## 2017-06-18 MED ORDER — METHYLPREDNISOLONE 4 MG PO TBPK: ORAL_TABLET | ORAL | 1 refills | Status: DC

## 2017-06-19 ENCOUNTER — Encounter: Payer: Self-pay | Admitting: Gastroenterology

## 2017-06-20 MED FILL — METHYLPREDNISOLONE 4 MG TAB: 4 | 6 days supply | Qty: 21 | Fill #0

## 2017-06-28 ENCOUNTER — Encounter: Payer: Self-pay | Admitting: Family Medicine

## 2017-06-28 ENCOUNTER — Ambulatory Visit: Payer: 59 | Admitting: Family Medicine

## 2017-06-28 ENCOUNTER — Other Ambulatory Visit: Payer: Self-pay

## 2017-06-28 VITALS — BP 120/78 | HR 62 | Temp 97.6°F | Ht 68.0 in | Wt 186.4 lb

## 2017-06-28 DIAGNOSIS — R7301 Impaired fasting glucose: Secondary | ICD-10-CM | POA: Diagnosis not present

## 2017-06-28 DIAGNOSIS — E291 Testicular hypofunction: Secondary | ICD-10-CM | POA: Diagnosis not present

## 2017-06-28 DIAGNOSIS — I10 Essential (primary) hypertension: Secondary | ICD-10-CM

## 2017-06-28 DIAGNOSIS — F3342 Major depressive disorder, recurrent, in full remission: Secondary | ICD-10-CM | POA: Diagnosis not present

## 2017-06-28 DIAGNOSIS — G894 Chronic pain syndrome: Secondary | ICD-10-CM

## 2017-06-28 LAB — CBC
HCT: 47.7 % (ref 39.0–52.0)
Hemoglobin: 17.1 g/dL — ABNORMAL HIGH (ref 13.0–17.0)
MCHC: 36 g/dL (ref 30.0–36.0)
MCV: 93.4 fl (ref 78.0–100.0)
Platelets: 242 10*3/uL (ref 150.0–400.0)
RBC: 5.1 Mil/uL (ref 4.22–5.81)
RDW: 13 % (ref 11.5–15.5)
WBC: 6.8 10*3/uL (ref 4.0–10.5)

## 2017-06-28 LAB — BASIC METABOLIC PANEL
BUN: 17 mg/dL (ref 6–23)
CO2: 33 mEq/L — ABNORMAL HIGH (ref 19–32)
CREATININE: 1.06 mg/dL (ref 0.40–1.50)
Calcium: 9.8 mg/dL (ref 8.4–10.5)
Chloride: 98 mEq/L (ref 96–112)
GFR: 74.04 mL/min (ref >60.00)
GLUCOSE: 135 mg/dL — AB (ref 70–99)
POTASSIUM: 3.6 meq/L (ref 3.5–5.1)
Sodium: 137 mEq/L (ref 135–145)

## 2017-06-28 LAB — GLUCOSE, RANDOM: GLUCOSE: 133 mg/dL — AB (ref 70–99)

## 2017-06-28 LAB — TESTOSTERONE: TESTOSTERONE: 443.53 ng/dL (ref 300.00–890.00)

## 2017-06-28 MED ORDER — HYDROCODONE-ACETAMINOPHEN 5-325 MG PO TABS: 0.5000 | ORAL_TABLET | Freq: Two times a day (BID) | ORAL | 0 refills | Status: DC | PRN

## 2017-06-28 MED ORDER — EPINEPHRINE 0.3 MG/0.3ML IJ SOAJ: INTRAMUSCULAR | 1 refills | Status: DC

## 2017-06-28 MED FILL — EPINEPHRINE 0.3 MG AUTO-INJ: 0.3 | 30 days supply | Qty: 2 | Fill #0

## 2017-06-28 MED FILL — HYDROCODON-APAP 5-325: 5-325 | 90 days supply | Qty: 52 | Fill #0

## 2017-06-28 NOTE — Assessment & Plan Note (Addendum)
S: Patient states depression is controlled on wellbutrin 300mg . PHQ9 today of 7. Also uses klonopin to help with panic attacks usually 4-5 per week. He has lost a close counsin recently and that has been tough on him though- some grieving as reason for elevated phq9 A/P: recurrent major depression in remission. continue current medications and check in at least every 4-8 months

## 2017-06-28 NOTE — Patient Instructions (Addendum)
Please stop by lab before you go Have them release: CBC, testosterone, and BMP. We do not need the glucose since we are getting the BMP.   Please call Dr. Loletha Carrow back for your colonoscopy  I refilled vicodin #[phone] to last at least 4-6 months.   Please fill out PHQ9 for me before you go

## 2017-06-28 NOTE — Progress Notes (Signed)
Subjective:  [name] Page is a 67 y.o. year old very pleasant male patient who presents for/with See problem oriented charting ROS- still with some ear fullness feeling. No chest pain or shortness of breath. Still with foot pain. Denies SI.    Past Medical History-  Patient Active Problem List   Diagnosis Date Noted  . Chronic pain syndrome 08/31/2016    Priority: High  . History of lung cancer 12/11/2012    Priority: High  . Generalized anxiety disorder 03/12/2014    Priority: Medium  . Essential hypertension 03/12/2014    Priority: Medium  . Hereditary and idiopathic peripheral neuropathy 12/22/2012    Priority: Medium  . Dyspnea 07/14/2012    Priority: Medium  . Hypertriglyceridemia 07/14/2012    Priority: Medium  . Depression 02/16/2010    Priority: Medium  . Low testosterone 11/21/2007    Priority: Medium  . Eustachian tube dysfunction 12/17/2014    Priority: Low  . Nonspecific abnormal electrocardiogram (ECG) (EKG) 07/14/2012    Priority: Low  . Allergic reaction, history of 10/25/2011    Priority: Low  . Rectal or anal pain 10/01/2010    Priority: Low  . Allergic rhinitis 06/13/2007    Priority: Low  . GERD 06/13/2007    Priority: Low  . IBS (irritable bowel syndrome) 06/13/2007    Priority: Low  . Palpitations 08/11/2016  . 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 Peroneal tendonitis of right lower extremity 05/08/2014  . SI (sacroiliac) joint dysfunction 09/19/2013  . Nonallopathic lesion of sacral region 09/19/2013  . Nonallopathic lesion of thoracic region 09/19/2013  . Nonallopathic lesion of lumbosacral region 09/19/2013  . Right foot pain 12/22/2012    Medications- reviewed and updated Current Outpatient Medications  Medication Sig Dispense Refill  . aspirin EC 81 MG tablet Take 81 mg by mouth daily.    Marland Kitchen b complex vitamins tablet Take 1 tablet by mouth daily.      Marland Kitchen buPROPion (WELLBUTRIN XL) 300 MG 24 hr tablet TAKE 1 TABLET BY MOUTH EVERY DAY  90 tablet 3  . chlorthalidone (HYGROTON) 25 MG tablet Take 0.5-1 tablets (12.5-25 mg total) by mouth daily. 30 tablet 3  . Cholecalciferol (VITAMIN D) 2000 UNITS CAPS Take 1 capsule by mouth daily.     . clonazePAM (KLONOPIN) 0.5 MG tablet TAKE 1 TABLET BY MOUTH TWICE DAILY AS NEEDED FOR ANXIETY 60 tablet 5  . Coenzyme Q10 (CO Q 10 PO) Take 1 capsule by mouth daily.    [name], [name], (DAMIANA LEAF PO) Take 700 mg by mouth daily.    Marland Kitchen DHEA 25 MG CAPS Take 1 capsule by mouth.    . gabapentin (NEURONTIN) 300 MG capsule TAKE 1 TO 2 CAPSULES BY MOUTH AT BEDTIME 180 capsule 1  . HYDROcodone-acetaminophen (NORCO/VICODIN) 5-325 MG tablet Take 0.5-1 tablets by mouth 2 (two) times daily as needed (No more than 1-2x a week use.). 52 tablet 0  . Insulin Pen Needle 29G X 78.2NF MISC 1 application by Does not apply route daily. 100 each 3  . MAGNESIUM PO Take 1 capsule by mouth daily.    . methylPREDNISolone (MEDROL DOSEPAK) 4 MG TBPK tablet Take in the morning with food 21 tablet 1  . NEEDLE, DISP, 23 G 23G X 1-1/2" MISC Use as directed 100 each 3  . Omega-3 Fatty Acids (FISH OIL) 1000 MG CAPS Take 1 capsule by mouth 2 (two) times daily.     . potassium chloride (K-DUR,KLOR-CON) 10 MEQ tablet Take  1-2 tablets daily 60 tablet 5  . Saw Palmetto, Serenoa repens, (SAW PALMETTO PO) Take 1 capsule by mouth 2 (two) times daily.    . SYRINGE-NEEDLE, DISP, 3 ML 22G X 1-1/2" 3 ML MISC Use as directed. 100 each 3  . testosterone cypionate (DEPOTESTOTERONE CYPIONATE) 100 MG/ML injection Inject 0.75 mLs (75 mg total) into the muscle once a week. For IM use only (Patient taking differently: Inject 75 mg into the muscle once a week. For IM use only. Patient took 0.25 mg of the 200mg /ml that he original that he had.) 4 mL 3  . diltiazem (CARDIZEM CD) 180 MG 24 hr capsule Take 1 capsule (180 mg total) by mouth daily. 90 capsule 3  . EPINEPHrine (EPIPEN 2-PAK) 0.3 mg/0.3 mL IJ SOAJ injection USE AS NEEDED 2 Device  1   No current facility-administered medications for this visit.    Facility-Administered Medications Ordered in Other Visits  Medication Dose Route Frequency Provider Last Rate Last Dose  . gadopentetate dimeglumine (MAGNEVIST) injection 20 mL  20 mL Intravenous Once PRN Melvenia Beam, MD        Objective: BP 120/78 (BP Location: Left Arm, Patient Position: Sitting, Cuff Size: Large)   Pulse 62   Temp 97.6 F (36.4 C) (Oral)   Ht 5\' 8"  (1.727 m)   Wt 186 lb 6.4 oz (84.6 kg)   SpO2 96%   BMI 28.34 kg/m  Gen: NAD, resting comfortably CV: RRR no murmurs rubs or gallops Lungs: CTAB no crackles, wheeze, rhonchi Abdomen: soft/nontender/nondistended/normal bowel sounds.  Ext: no edema Skin: warm, dry  Assessment/Plan:  Other issues 1. Colonoscopy due- he will give Dr. Loletha Carrow a call back 2. CXR last 02/2017- do yearly with history lung cancer- scan only if concerning symptoms or changes to x-ray per oncology 3. Started exercise bike 30-60 minutes a day most days- got around January. Down 5 lbs from January 3rd 4. Has seen Dr. Ernesto Rutherford ENT for mastoiditis- antibiotics and ear drops- still with some eustachian tube dysfunction- considering steroid dose pack. He is holding off as long as it is mild.   Essential hypertension S: controlled on chlorthalidone 25mg , diltiazem 180mg  XR. Amlodipine= intolerable edema to patient.  BP Readings from Last 3 Encounters:  06/28/17 120/78  05/17/17 134/86  04/07/17 (!) 143/88  A/P: We discussed blood goal of <140/90. Continue current meds:  Will update bmet as on potassium  Chronic pain syndrome S: continued pain from idiopathic neuropathy. Remains on 1-2 vicodin a week. Also gabapentin nightly- having to do 600 every night now instead of 300 at times. NCCSRS reviewed with klonopin and vicodin through me and tesostosterone rx through Dr. Tamala Julian.  A/P: refill vicodin #52 to last at least 4 months if not 6 months. Reasonable control on current  regimen and overall low risk volume of medication - new pain contract today -12/29/26 UDS reassuring.  -see NCCSRS notes above  Depression S: Patient states depression is controlled on wellbutrin 300mg . PHQ9 today of 7. Also uses klonopin to help with panic attacks usually 4-5 per week. He has lost a close counsin recently and that has been tough on him though- some grieving as reason for elevated phq9 A/P: recurrent major depression in remission. continue current medications and check in at least every 4-8 months   Future Appointments  Date Time Provider [place]  07/15/2017  9:45 AM Elayne Snare, MD [facility] None  08/25/2017  8:15 AM Elayne Snare, MD [facility] None  08/26/2017  8:00 AM  Patel, Donika K, DO [facility] None   No follow-ups on file.  Lab/Order associations: Essential hypertension - Plan: Basic metabolic panel  Hypogonadism in male - Plan: CBC, Testosterone  IFG (impaired fasting glucose) - Plan: Glucose, random  Meds ordered this encounter  Medications  . DISCONTD: HYDROcodone-acetaminophen (NORCO/VICODIN) 5-325 MG tablet    Sig: Take 0.5-1 tablets by mouth 2 (two) times daily as needed (No more than 1-2x a week use.).    Dispense:  52 tablet    Refill:  0  . HYDROcodone-acetaminophen (NORCO/VICODIN) 5-325 MG tablet    Sig: Take 0.5-1 tablets by mouth 2 (two) times daily as needed (No more than 1-2x a week use.).    Dispense:  52 tablet    Refill:  0  Shredded first rx- second rx was sent electronically instead of printed  Return precautions advised.  [name], MD

## 2017-06-28 NOTE — Assessment & Plan Note (Signed)
S: controlled on chlorthalidone 25mg , diltiazem 180mg  XR. Amlodipine= intolerable edema to patient.  BP Readings from Last 3 Encounters:  06/28/17 120/78  05/17/17 134/86  04/07/17 (!) 143/88  A/P: We discussed blood goal of <140/90. Continue current meds:  Will update bmet as on potassium

## 2017-06-28 NOTE — Assessment & Plan Note (Signed)
S: continued pain from idiopathic neuropathy. Remains on 1-2 vicodin a week. Also gabapentin nightly- having to do 600 every night now instead of 300 at times. NCCSRS reviewed with klonopin and vicodin through me and tesostosterone rx through Dr. Tamala Julian.  A/P: refill vicodin #52 to last at least 4 months if not 6 months. Reasonable control on current regimen and overall low risk volume of medication - new pain contract today -12/29/26 UDS reassuring.  -see NCCSRS notes above

## 2017-07-05 ENCOUNTER — Other Ambulatory Visit: Payer: Self-pay | Admitting: Family Medicine

## 2017-07-05 MED FILL — clonazePAM 0.5 MG TABS: 0.5 | 30 days supply | Qty: 60 | Fill #4

## 2017-07-05 MED FILL — CHLORTHALIDONE 25 MG TAB: 25 | 30 days supply | Qty: 30 | Fill #0

## 2017-07-07 MED FILL — POTASSIUM CL 10 MEQ TAB SA: 10 | 30 days supply | Qty: 60 | Fill #1

## 2017-07-12 ENCOUNTER — Other Ambulatory Visit: Payer: 59

## 2017-07-14 NOTE — Progress Notes (Signed)
Patient ID: [name] Page, male   DOB: 1950-11-27, 67 y.o.   MRN: [id number]             Chief complaint: Endocrinology follow-up  History of Present Illness   HYPOGONADISM:  Hypogonadismwas diagnosed in ?  2012  He at that time had complaints offatigue, some increased depression, decreased libido although he does not remember symptoms well There is no history of the following: Hot flushes, sweats, breast enlargement, long term anabolic steroid use, history of testicular injury, head injury or mumps in childhood. No history of osteopenia or low impact fracture  At that time an afternoon testosterone level was 214 and free testosterone level low at 32, normal >47   He was also evaluated by a urologist and details are not available He was treated with testosterone gel but he thinks this did not improve his testosterone levels and likely did not continue treatment  Subsequently he was again evaluated in 2015 and with a low testosterone of 239 he was started on testosterone injections With this he thinks she had some improvement in his fatigue and depression His dose had been adjusted from his initial starting dose of 100 mg every 2 weeks and he has been on various regimens including every 3 or 4 weeks injections  RECENT history: His testosterone level has been quite variable over the last 2-3 years  Because of his increased hematocrit on his initial consultation he was told to injection every week and try the 75 mg every week However his testosterone level went down to about 214 partly because of reducing the doses further on his own and being concerned about his hematocrit He has been taking 50 mg of testosterone every week now and takes this on Fridays Also he has no difficulty doing the injection  He does not think he has any unusual fatigue or exhaustion and no symptoms similar to before he started treatment Has been very regular with his injections  The  testosterone level is now excellent at 444 even though it was unexpectedly high in February Free testosterone level in February was 16.9, upper normal    Prior lab results showtestosterone levels of:   Lab Results  Component Value Date   TESTOSTERONE 443.53 06/28/2017   TESTOSTERONE 823 05/31/2017   TESTOSTERONE 214.06 (L) 05/04/2017   TESTOSTERONE 312.95 04/07/2017    Prolactin level: Normal   Lab Results  Component Value Date   LH <0.20 (L) 04/07/2017   Lab Results  Component Value Date   HGB 17.1 (H) 06/28/2017        Allergies as of 07/15/2017      Reactions   Bee Venom Anaphylaxis   Flagyl [metronidazole Hcl]    Nsaids    REACTION: intolereance      Medication List        Accurate as of 07/15/17 10:04 AM. Always use your most recent med list.          aspirin EC 81 MG tablet Take 81 mg by mouth daily.   b complex vitamins tablet Take 1 tablet by mouth daily.   buPROPion 300 MG 24 hr tablet Commonly known as:  WELLBUTRIN XL TAKE 1 TABLET BY MOUTH EVERY DAY   chlorthalidone 25 MG tablet Commonly known as:  HYGROTON TAKE 1/2-1 TABLETS (12.5-25 MG TOTAL) BY MOUTH DAILY.   clonazePAM 0.5 MG tablet Commonly known as:  KLONOPIN TAKE 1 TABLET BY MOUTH TWICE DAILY AS NEEDED FOR ANXIETY   CO Q 10 PO  Take 1 capsule by mouth daily.   DAMIANA LEAF PO Take 700 mg by mouth daily.   diltiazem 180 MG 24 hr capsule Commonly known as:  CARDIZEM CD Take 1 capsule (180 mg total) by mouth daily.   EPINEPHrine 0.3 mg/0.3 mL Soaj injection Commonly known as:  EPIPEN 2-PAK USE AS NEEDED   Fish Oil 1000 MG Caps Take 1 capsule by mouth 2 (two) times daily.   gabapentin 300 MG capsule Commonly known as:  NEURONTIN TAKE 1 TO 2 CAPSULES BY MOUTH AT BEDTIME   HYDROcodone-acetaminophen 5-325 MG tablet Commonly known as:  NORCO/VICODIN Take 0.5-1 tablets by mouth 2 (two) times daily as needed (No more than 1-2x a week use.).   Insulin Pen Needle 29G X  66.0YT Misc 1 application by Does not apply route daily.   MAGNESIUM PO Take 1 capsule by mouth daily.   NEEDLE (DISP) 23 G 23G X 1-1/2" Misc Use as directed   potassium chloride 10 MEQ tablet Commonly known as:  K-DUR,KLOR-CON Take 1-2 tablets daily   SAW PALMETTO PO Take 1 capsule by mouth 2 (two) times daily.   SYRINGE-NEEDLE (DISP) 3 ML 22G X 1-1/2" 3 ML Misc Use as directed.   testosterone cypionate 100 MG/ML injection Commonly known as:  DEPOTESTOTERONE CYPIONATE Inject 0.75 mLs (75 mg total) into the muscle once a week. For IM use only   Vitamin D 2000 units Caps Take 1 capsule by mouth daily.       Allergies:  Allergies  Allergen Reactions  . Bee Venom Anaphylaxis  . Flagyl [Metronidazole Hcl]   . Nsaids     REACTION: intolereance    Past Medical History:  Diagnosis Date  . Allergic rhinitis, cause unspecified   . 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 Depressive disorder, not elsewhere classified   . Esophageal reflux   . HTN (hypertension)   . Lymphocytic colitis   . Neuropathy   . Other testicular hypofunction   . Personal history of urinary calculi     Past Surgical History:  Procedure Laterality Date  . 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 parotid tumor surgery    . TONSILLECTOMY AND ADENOIDECTOMY      Family History  Problem Relation Age of Onset  . Hypertension Mother   . Anxiety disorder Mother   . Non-Hodgkin's lymphoma Mother   . Dementia Father   . Depression Father        and anxiety  . Hypertension Father   . Sudden death Paternal Uncle 47  . Hypertension Brother   . Early death Neg Hx   . Heart disease Neg Hx        mother in 68s had lesoin    Social History:  reports that he has never smoked. He has never used smokeless tobacco. He reports that he drinks alcohol. He reports that he does not use drugs.  Review of Systems  IMPAIRED fasting glucose:  Although his  A1c is quite normal consistently his fasting glucose was high again as shown below This is despite his trying to exercise very regularly and losing some weight   Wt Readings from Last 3 Encounters:  07/15/17 183 lb 12.8 oz (83.4 kg)  06/28/17 186 lb 6.4 oz (84.6 kg)  05/17/17 187 lb 6.4 oz (85 kg)   Lab Results  Component Value Date   GLUCOSE 133 (H) 06/28/2017   GLUCOSE 135 (H) 06/28/2017  GLUCOSE 127 (H) 05/31/2017   Lab Results  Component Value Date   HGBA1C 5.6 04/07/2017   HGBA1C 5.5 12/28/2016   HGBA1C 5.7 06/17/2015   Lab Results  Component Value Date   LDLCALC 93 12/28/2016   CREATININE 1.06 06/28/2017    General Examination:   BP 134/82 (BP Location: Left Arm, Patient Position: Standing, Cuff Size: Normal)   Pulse 83   Ht 5\' 8"  (1.727 m)   Wt 183 lb 12.8 oz (83.4 kg)   SpO2 98%   BMI 27.95 kg/m    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 Also may have component of secondary hypogonadism with metabolic syndrome  Subjectively doing well with 50 mg of testosterone every Friday His last level is over 400 appears to be appropriate for his treatment His hemoglobin has not been as high and currently upper normal Previously his free testosterone was in line with his total testosterone  IMPAIRED fasting glucose with normal A1c His fasting glucose is not correlating with his A1c which is not even in prediabetic range   Plan:  He will continue the same regimen for now, taking 50 mg every week  Testosterone level to be rechecked in 4 months  Discussed that even transdermal preparations may not be useful since he had used relatively large doses previously and had found the application process cumbersome  However he can consider Xyosted when approved by his insurance  Consider 2-hour glucose tolerance test if the A1c or fasting reading continues to be high   Elayne Snare 07/15/2017, 10:04 AM      Note: This office note was prepared with Dragon voice recognition system technology. Any transcriptional errors that result from this process are unintentional.

## 2017-07-15 ENCOUNTER — Encounter: Payer: Self-pay | Admitting: Endocrinology

## 2017-07-15 ENCOUNTER — Ambulatory Visit: Payer: 59 | Admitting: Endocrinology

## 2017-07-15 VITALS — BP 134/82 | HR 83 | Ht 68.0 in | Wt 183.8 lb

## 2017-07-15 DIAGNOSIS — E291 Testicular hypofunction: Secondary | ICD-10-CM | POA: Diagnosis not present

## 2017-07-15 DIAGNOSIS — R7301 Impaired fasting glucose: Secondary | ICD-10-CM

## 2017-07-15 NOTE — Patient Instructions (Signed)
Xyosted inj

## 2017-07-21 ENCOUNTER — Other Ambulatory Visit: Payer: Self-pay | Admitting: Family Medicine

## 2017-07-21 MED FILL — TESTOSTERONE CYP 200 MG/ML: 200 | 28 days supply | Qty: 2 | Fill #3

## 2017-07-22 NOTE — Telephone Encounter (Signed)
It would be fine to forward to him since he rxs the most- well actually please send to his nurse or cma

## 2017-07-25 ENCOUNTER — Other Ambulatory Visit: Payer: Self-pay | Admitting: Family Medicine

## 2017-07-25 MED FILL — GABAPENTIN 300 MG CAPSULE: 300 | 90 days supply | Qty: 180 | Fill #0

## 2017-08-07 MED FILL — clonazePAM 0.5 MG TABS: 0.5 | 30 days supply | Qty: 60 | Fill #5

## 2017-08-07 MED FILL — CHLORTHALIDONE 25 MG TAB: 25 | 30 days supply | Qty: 30 | Fill #1

## 2017-08-08 MED FILL — POTASSIUM CL 10 MEQ TAB SA: 10 | 30 days supply | Qty: 60 | Fill #2

## 2017-08-22 ENCOUNTER — Other Ambulatory Visit: Payer: Self-pay | Admitting: Family Medicine

## 2017-08-22 MED FILL — buPROPion HCL ER (XL) 300 M: 300 | 90 days supply | Qty: 90 | Fill #0

## 2017-08-24 ENCOUNTER — Ambulatory Visit: Payer: Self-pay | Admitting: Endocrinology

## 2017-08-25 ENCOUNTER — Ambulatory Visit: Payer: Self-pay | Admitting: Endocrinology

## 2017-08-26 ENCOUNTER — Encounter: Payer: Self-pay | Admitting: Neurology

## 2017-08-26 ENCOUNTER — Ambulatory Visit (INDEPENDENT_AMBULATORY_CARE_PROVIDER_SITE_OTHER): Payer: 59 | Admitting: Neurology

## 2017-08-26 VITALS — BP 110/80 | HR 63 | Ht 68.0 in | Wt 181.4 lb

## 2017-08-26 DIAGNOSIS — G629 Polyneuropathy, unspecified: Secondary | ICD-10-CM

## 2017-08-26 NOTE — Patient Instructions (Addendum)
Start nortriptyline 10mg  at bedtime  Return to clinic in 6 months

## 2017-08-26 NOTE — Progress Notes (Signed)
Follow-up Visit   Date: 08/26/17    [name] Page MRN: [id number] DOB: 07/19/1950   Interim History: [name] Page is a 67 y.o. 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  The patient was accompanied to the clinic by self.  History of present illness: In 2011, he was diagnosed with diverticulitis and incidentally was found to have lung tumor on imaging which was later found to be carcinoid.  To optimize him for potential surgery, he was treated for diverticulitis with metronidazole for 6 weeks. A few weeks later, he noticed abnormal sensation of the feet as if his socks were rolled under his toes.  He then also started having sharp needle sensation and achy soreness. He has tried soaking his feet in hot water baths which provides some relief.  If he has been his feet a lot, his pain is worse.  He was started on gabapentin 300-'600mg'$  at bedtime, but has difficulty with balance while on this.  He endorses suffering one fall in 2017 while getting out of the shower.  He was evaluated by Dr. Jacelyn Grip here in 2013 where NCS/EMG was normal.  Over the past few years, he has noticed greater intensity of pain in the feet and symptoms are more constant, worse at night.   He denies any weakness.  He was tried on vitamin B12 injections which did not provide any relief.  He has also tried Cymbalta and Effexor.  He was given a prescription for Lyrica but did not take this after reading potential side effects.   He has some word-finding difficulty and forgetfulness.  He has family history of Alzheimer's dementia and is concerned about his risk to develop this.  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  [place] 08/26/2017:  He is here for follow-up visit.  He continues to have days which his neuropathy bother  him more, usually after an active day at home.  Symptom of burning and tingling is always worse at night.  He has noticed some improvement with lidocaine and gabapentin '600mg'$  at bedtime.  He is taking supplements for neuropathic pain including alpha lipoic acid. He has imbalance and has caught himself several times.  He continues to walk unassisted.  His memory is doing much better and attributes this to improved mood/depression.   He was evaluated by Dr. Jaynee Eagles in November for vertigo.  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  He was treated with antibiotics and is doing well.   Medications:  Current Outpatient Medications on File Prior to Visit  Medication Sig Dispense Refill  . aspirin EC 81 MG tablet Take 81 mg by mouth daily.    Marland Kitchen b complex vitamins tablet Take 1 tablet by mouth daily.      Marland Kitchen buPROPion (WELLBUTRIN XL) 300 MG 24 hr tablet TAKE 1 TABLET BY MOUTH EVERY DAY 90 tablet 3  . chlorthalidone (HYGROTON) 25 MG tablet TAKE 1/2-1 TABLETS (12.5-25 MG TOTAL) BY MOUTH DAILY. (Patient taking differently: take 1 tab daily.) 30 tablet 5  . Cholecalciferol (VITAMIN D) 2000 UNITS CAPS Take 1 capsule by mouth daily.     . clonazePAM (KLONOPIN) 0.5 MG tablet TAKE 1 TABLET BY MOUTH TWICE DAILY AS NEEDED FOR ANXIETY 60 tablet 5  . Coenzyme Q10 (CO Q 10 PO) Take 1 capsule by  mouth daily.    [name], [name], (DAMIANA LEAF PO) Take 700 mg by mouth daily.    Marland Kitchen diltiazem (CARDIZEM CD) 180 MG 24 hr capsule Take 1 capsule (180 mg total) by mouth daily. 90 capsule 3  . EPINEPHrine (EPIPEN 2-PAK) 0.3 mg/0.3 mL IJ SOAJ injection USE AS NEEDED 2 Device 1  . gabapentin (NEURONTIN) 300 MG capsule TAKE 1 TO 2 CAPSULES BY MOUTH AT BEDTIME 180 capsule 1  . HYDROcodone-acetaminophen (NORCO/VICODIN) 5-325 MG tablet Take 0.5-1 tablets by mouth 2 (two) times daily as needed (No more than 1-2x a week use.). 52 tablet 0  . Insulin Pen Needle 29G X 21.2YQ MISC 1 application by  Does not apply route daily. 100 each 3  . MAGNESIUM PO Take 1 capsule by mouth daily.    Marland Kitchen NEEDLE, DISP, 23 G 23G X 1-1/2" MISC Use as directed 100 each 3  . Omega-3 Fatty Acids (FISH OIL) 1000 MG CAPS Take 1 capsule by mouth 2 (two) times daily.     . potassium chloride (K-DUR,KLOR-CON) 10 MEQ tablet Take 1-2 tablets daily 60 tablet 5  . Saw Palmetto, Serenoa repens, (SAW PALMETTO PO) Take 1 capsule by mouth 2 (two) times daily.    . SYRINGE-NEEDLE, DISP, 3 ML 22G X 1-1/2" 3 ML MISC Use as directed. 100 each 3  . testosterone cypionate (DEPOTESTOSTERONE CYPIONATE) 200 MG/ML injection   5   Current Facility-Administered Medications on File Prior to Visit  Medication Dose Route Frequency Provider Last Rate Last Dose  . gadopentetate dimeglumine (MAGNEVIST) injection 20 mL  20 mL Intravenous Once PRN Melvenia Beam, MD        Allergies:  Allergies  Allergen Reactions  . Bee Venom Anaphylaxis  . Flagyl [Metronidazole Hcl]   . Nsaids     REACTION: intolereance    Review of Systems:  CONSTITUTIONAL: No fevers, chills, night sweats, or weight loss.  EYES: No visual changes or eye pain ENT: No hearing changes.  No history of nose bleeds.   RESPIRATORY: No cough, wheezing and shortness of breath.   CARDIOVASCULAR: Negative for chest pain, and palpitations.   GI: Negative for abdominal discomfort, blood in stools or black stools.  No recent change in bowel habits.   GU:  No history of incontinence.   MUSCLOSKELETAL: No history of joint pain or swelling.  No myalgias.   SKIN: Negative for lesions, rash, and itching.   ENDOCRINE: Negative for cold or heat intolerance, polydipsia or goiter.   PSYCH:  No depression or anxiety symptoms.   NEURO: As Above.   Vital Signs:  BP 110/80   Pulse 63   Ht '5\' 8"'$  (1.727 m)   Wt 181 lb 6 oz (82.3 kg)   SpO2 95%   BMI 27.58 kg/m    General Medical Exam:   General:  Well appearing, comfortable  Neck:  No carotid bruits. Respiratory:  Clear to  auscultation, good air entry bilaterally.   Cardiac:  Regular rate and rhythm, no murmur.   Ext:  No edema  Neurological Exam: MENTAL STATUS including orientation to time, place, person, recent and remote memory, attention span and concentration, language, and fund of knowledge is normal.  Speech is not dysarthric.  CRANIAL NERVES: No visual field defects.  Pupils equal round and reactive to light.  Normal conjugate, extra-ocular eye movements in all directions of gaze.  No ptosis. Normal facial sensation.  Face is symmetric. Palate elevates symmetrically.  Tongue is midline.  MOTOR:  Motor strength is 5/5 in all extremities.  No pronator drift.  Tone is normal.    MSRs:  Reflexes are 2+/4 throughout, except absent bilateral Achilles.  SENSORY:  Vibration is reduced at the great toe bilaterally, intact at the ankles.  Temperature and pin prick is minimally reduced at the feet, proprioception is intact. There is mild sway Rhomberg testing.  COORDINATION/GAIT:  Intact rapid alternating movements bilaterally.  Gait narrow based and stable.  Unsteady with tandem gait, but able to perform.  Data: NCS/EMG of the legs 09/02/2017:   The electrophysiologic findings are most consistent with a distal and symmetric sensorimotor polyneuropathy, axon loss in type, affecting the lower extremities.  Overall, these findings are mild in degree electrically.  Labs 08/26/2016:  Vitamin B12 1227, vitamin B1 97, SPEP with IFE no M protein, heavy metal screen neg, copper, ESR 2  IMPRESSION/PLAN: Idiopathic peripheral neuropathy.  Clinically, his exam is unchanged and shows distal predominant sensory loss and sensory ataxia.  Distal strength is intact.  He continues to have painful paresthesias of the feet which is poorly controlled on gabapentin '600mg'$  at bedtime.  Due to lightheadedness, it was decided not to titrate this further.  Instead, start nortriptyline '10mg'$  at bedtime.  This can be titrated, if patient is  tolerating. Previously tried:  Lyrica, Cymbalta, Effexor  Subjective memory changes have improved and most likely mood-related.  No signs of neurodegenerative process.    Return to clinic in 6 months  Thank you for allowing me to participate in patient's care.  If I can answer any additional questions, I would be pleased to do so.    Sincerely,    Donika K. Posey Pronto, DO

## 2017-09-05 ENCOUNTER — Other Ambulatory Visit: Payer: Self-pay | Admitting: Endocrinology

## 2017-09-05 ENCOUNTER — Other Ambulatory Visit: Payer: Self-pay

## 2017-09-05 ENCOUNTER — Other Ambulatory Visit: Payer: Self-pay | Admitting: Family Medicine

## 2017-09-05 ENCOUNTER — Other Ambulatory Visit: Payer: Self-pay | Admitting: Cardiology

## 2017-09-05 ENCOUNTER — Telehealth: Payer: Self-pay

## 2017-09-05 MED ORDER — CLONAZEPAM 0.5 MG PO TABS: 0.5000 mg | ORAL_TABLET | Freq: Two times a day (BID) | ORAL | 5 refills | Status: DC | PRN

## 2017-09-05 MED ORDER — TESTOSTERONE CYPIONATE 200 MG/ML IM SOLN: INTRAMUSCULAR | 5 refills | Status: DC

## 2017-09-05 MED FILL — CHLORTHALIDONE 25 MG TAB: 25 | 30 days supply | Qty: 30 | Fill #2

## 2017-09-05 MED FILL — DILTIAZEM 24HR ER 180 MG CA: 180 | 90 days supply | Qty: 90 | Fill #0

## 2017-09-05 MED FILL — clonazePAM 0.5 MG TABS: 0.5 | 30 days supply | Qty: 60 | Fill #0

## 2017-09-05 MED FILL — POTASSIUM CL ER 10 MEQ TABL: 10 | 30 days supply | Qty: 60 | Fill #3

## 2017-09-05 NOTE — Telephone Encounter (Signed)
Rx sent to pharmacy   

## 2017-09-05 NOTE — Telephone Encounter (Signed)
Please Advise

## 2017-09-05 NOTE — Telephone Encounter (Signed)
Pt is requesting a refill on Testosterone. Would you please refill if appropriate?

## 2017-09-05 NOTE — Telephone Encounter (Signed)
I thought Roselyn Reef handled this 1 already?

## 2017-09-06 MED FILL — TESTOSTERONE CYP 200 MG/ML: 200 | 84 days supply | Qty: 3 | Fill #0

## 2017-09-08 ENCOUNTER — Other Ambulatory Visit: Payer: Self-pay | Admitting: Family Medicine

## 2017-09-08 NOTE — Telephone Encounter (Signed)
[place] and spoke to [place], she said Rx was filled on 6/3 and waiting to be picked up.

## 2017-09-12 ENCOUNTER — Encounter: Payer: Self-pay | Admitting: Family Medicine

## 2017-10-03 ENCOUNTER — Encounter: Payer: Self-pay | Admitting: Family Medicine

## 2017-10-05 ENCOUNTER — Other Ambulatory Visit: Payer: Self-pay

## 2017-10-05 ENCOUNTER — Other Ambulatory Visit (INDEPENDENT_AMBULATORY_CARE_PROVIDER_SITE_OTHER): Payer: 59

## 2017-10-05 ENCOUNTER — Encounter: Payer: Self-pay | Admitting: Family Medicine

## 2017-10-05 DIAGNOSIS — R002 Palpitations: Secondary | ICD-10-CM

## 2017-10-05 LAB — BASIC METABOLIC PANEL
BUN: 16 mg/dL (ref 6–23)
CO2: 30 meq/L (ref 19–32)
Calcium: 9.8 mg/dL (ref 8.4–10.5)
Chloride: 98 mEq/L (ref 96–112)
Creatinine, Ser: 1.02 mg/dL (ref 0.40–1.50)
GFR: 77.34 mL/min (ref >60.00)
GLUCOSE: 118 mg/dL — AB (ref 70–99)
POTASSIUM: 3.7 meq/L (ref 3.5–5.1)
SODIUM: 138 meq/L (ref 135–145)

## 2017-10-05 LAB — MAGNESIUM: Magnesium: 2.2 mg/dL (ref 1.5–2.5)

## 2017-10-05 MED FILL — clonazePAM 0.5 MG TABS: 0.5 | 30 days supply | Qty: 60 | Fill #1

## 2017-10-05 MED FILL — POTASSIUM CL ER 10 MEQ TABL: 10 | 30 days supply | Qty: 60 | Fill #4

## 2017-10-05 MED FILL — CHLORTHALIDONE 25 MG TAB: 25 | 30 days supply | Qty: 30 | Fill #3

## 2017-11-04 MED FILL — GABAPENTIN 300 MG CAPSULE: 300 | 90 days supply | Qty: 180 | Fill #1

## 2017-11-04 MED FILL — clonazePAM 0.5 MG TABS: 0.5 | 30 days supply | Qty: 60 | Fill #2

## 2017-11-04 MED FILL — CHLORTHALIDONE 25 MG TABS: 25 | 30 days supply | Qty: 30 | Fill #4

## 2017-11-06 MED FILL — POTASSIUM CL ER 10 MEQ TABL: 10 | 30 days supply | Qty: 60 | Fill #5

## 2017-11-22 MED FILL — buPROPion HCL ER (XL) 300 M: 300 | 90 days supply | Qty: 90 | Fill #1

## 2017-12-06 MED FILL — CHLORTHALIDONE 25 MG TABS: 25 | 30 days supply | Qty: 30 | Fill #5

## 2017-12-06 MED FILL — TESTOSTERONE CYP 200 MG/ML: 200 | 84 days supply | Qty: 3 | Fill #1

## 2017-12-06 MED FILL — POTASSIUM CHL ER M10 TABLET: 10 | 90 days supply | Qty: 90 | Fill #1

## 2017-12-06 MED FILL — CARTIA XT 180 MG CAPSULE SA: 180 | 90 days supply | Qty: 90 | Fill #1

## 2017-12-06 MED FILL — clonazePAM 0.5 MG TABS: 0.5 | 30 days supply | Qty: 60 | Fill #3

## 2017-12-21 ENCOUNTER — Encounter: Payer: Self-pay | Admitting: Family Medicine

## 2017-12-21 ENCOUNTER — Ambulatory Visit (INDEPENDENT_AMBULATORY_CARE_PROVIDER_SITE_OTHER): Payer: 59 | Admitting: Family Medicine

## 2017-12-21 ENCOUNTER — Ambulatory Visit (INDEPENDENT_AMBULATORY_CARE_PROVIDER_SITE_OTHER): Payer: 59

## 2017-12-21 VITALS — BP 136/84 | HR 74 | Temp 98.3°F | Ht 68.0 in | Wt 177.2 lb

## 2017-12-21 DIAGNOSIS — Z23 Encounter for immunization: Secondary | ICD-10-CM

## 2017-12-21 DIAGNOSIS — F3341 Major depressive disorder, recurrent, in partial remission: Secondary | ICD-10-CM

## 2017-12-21 DIAGNOSIS — Z85118 Personal history of other malignant neoplasm of bronchus and lung: Secondary | ICD-10-CM | POA: Diagnosis not present

## 2017-12-21 DIAGNOSIS — Z0000 Encounter for general adult medical examination without abnormal findings: Secondary | ICD-10-CM

## 2017-12-21 DIAGNOSIS — F411 Generalized anxiety disorder: Secondary | ICD-10-CM | POA: Diagnosis not present

## 2017-12-21 DIAGNOSIS — G609 Hereditary and idiopathic neuropathy, unspecified: Secondary | ICD-10-CM | POA: Diagnosis not present

## 2017-12-21 DIAGNOSIS — I10 Essential (primary) hypertension: Secondary | ICD-10-CM

## 2017-12-21 DIAGNOSIS — Z125 Encounter for screening for malignant neoplasm of prostate: Secondary | ICD-10-CM

## 2017-12-21 DIAGNOSIS — R5383 Other fatigue: Secondary | ICD-10-CM

## 2017-12-21 DIAGNOSIS — R05 Cough: Secondary | ICD-10-CM | POA: Diagnosis not present

## 2017-12-21 DIAGNOSIS — G894 Chronic pain syndrome: Secondary | ICD-10-CM

## 2017-12-21 MED ORDER — EPINEPHRINE 0.3 MG/0.3ML IJ SOAJ: INTRAMUSCULAR | 1 refills | Status: DC

## 2017-12-21 MED ORDER — HYDROCODONE-ACETAMINOPHEN 5-325 MG PO TABS: 0.5000 | ORAL_TABLET | Freq: Two times a day (BID) | ORAL | 0 refills | Status: DC | PRN

## 2017-12-21 MED FILL — EPINEPHRINE 0.3 MG AUTO-INJ: 0.3 | 2 days supply | Qty: 2 | Fill #0

## 2017-12-21 MED FILL — HYDROCODON-APAP 5-325: 5-325 | 89 days supply | Qty: 51 | Fill #0

## 2017-12-21 NOTE — Assessment & Plan Note (Signed)
Neuropathy- idiopathic. Starting to get some leg weakness. gabaepntin at night but fatigue on higher doses. Didn't start nortriptyline. Has not tried cymbalta as suggested in past. Colleague gave him samples of lyrica which he hasnt tried either- plans to discuss with neurology next visit. Sparing vicodin 1-2x a week (refilled today as running low- but #52 lasted 6 months)- signed pain contract 08/31/16. NCCSRS is going to be printed for me to review by nursing staff.  Have advised to avoid combo with klonopin.

## 2017-12-21 NOTE — Assessment & Plan Note (Signed)
HTN- on chlorthalidone 25mg , diltiazem 180mg  XR. switched off amlodipine due to edema. Home #s 130s/80s- as low as 120/70. With excellent home control- continue current rx. repeat BP here looks good

## 2017-12-21 NOTE — Assessment & Plan Note (Signed)
Some fatigue and cough- we will update x-ray with lung cancer history. We discussed possible fatigue if symptoms persist- advised 2 month recheck. Had planned for x-ray for 3 years yearly then space out after that- this is year 2.

## 2017-12-21 NOTE — Assessment & Plan Note (Signed)
phq9 of 10-  On wellbutrin 300mg . Thinks fatigue and work schedule are playing into that as well trouble with weakness from his neuropathy. Also seeing chiropractor for work. We discussed nortriptyline could help from neurology- he is nervous about increasing palpitations. Counseling hasnt helped in past- he just wants to get past the next month and will call for follow up if not improving after that tough stretch

## 2017-12-21 NOTE — Assessment & Plan Note (Signed)
See neuropathy section

## 2017-12-21 NOTE — Progress Notes (Addendum)
Phone: [phone]  Subjective:  Patient presents today for their annual physical. Chief complaint-noted.   See problem oriented charting- ROS- full  review of systems was completed and negative except for: activity change, fatigue, hearing loss, tinnitus, cough, palpitations, increased thrist and urination, urinary frequency and urgency, joint and back pain, gait  Problem, muscle aches, numbness, weakness, bruising or bleeding easily, decreased concentration, anxiety, sleep issues  The following were reviewed and entered/updated in epic: Past Medical History:  Diagnosis Date  . Allergic rhinitis, cause unspecified   . 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 Depressive disorder, not elsewhere classified   . Esophageal reflux   . HTN (hypertension)   . Lymphocytic colitis   . Neuropathy   . Other testicular hypofunction   . Personal history of urinary calculi    Patient Active Problem List   Diagnosis Date Noted  . Chronic pain syndrome 08/31/2016    Priority: High  . History of lung cancer 12/11/2012    Priority: High  . Generalized anxiety disorder 03/12/2014    Priority: Medium  . Essential hypertension 03/12/2014    Priority: Medium  . Hereditary and idiopathic peripheral neuropathy 12/22/2012    Priority: Medium  . Dyspnea 07/14/2012    Priority: Medium  . Hypertriglyceridemia 07/14/2012    Priority: Medium  . Depression 02/16/2010    Priority: Medium  . Low testosterone 11/21/2007    Priority: Medium  . Eustachian tube dysfunction 12/17/2014    Priority: Low  . Nonspecific abnormal electrocardiogram (ECG) (EKG) 07/14/2012    Priority: Low  . Allergic reaction, history of 10/25/2011    Priority: Low  . Rectal or anal pain 10/01/2010    Priority: Low  . Allergic rhinitis 06/13/2007    Priority: Low  . GERD 06/13/2007    Priority: Low  . IBS (irritable bowel syndrome) 06/13/2007    Priority: Low  . Palpitations 08/11/2016  . Peroneal tendonitis of  right lower extremity 05/08/2014  . SI (sacroiliac) joint dysfunction 09/19/2013  . Nonallopathic lesion of sacral region 09/19/2013  . Nonallopathic lesion of thoracic region 09/19/2013  . Nonallopathic lesion of lumbosacral region 09/19/2013  . Right foot pain 12/22/2012   Past Surgical History:  Procedure Laterality Date  . 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 parotid tumor surgery    . TONSILLECTOMY AND ADENOIDECTOMY      Family History  Problem Relation Age of Onset  . Hypertension Mother   . Anxiety disorder Mother   . Non-Hodgkin's lymphoma Mother   . Dementia Father   . Depression Father        and anxiety  . Hypertension Father   . Sudden death Paternal Uncle 10  . Hypertension Brother   . Early death Neg Hx   . Heart disease Neg Hx        mother in 8s had lesoin    Medications- reviewed and updated Current Outpatient Medications  Medication Sig Dispense Refill  . Acetylcarnitine HCl (ACETYL L-CARNITINE PO) Take by mouth.    . ACETYLCYSTEINE PO Take by mouth.    . ALPHA-LIPOIC ACID PO Take by mouth.    Marland Kitchen aspirin EC 81 MG tablet Take 81 mg by mouth daily.    Marland Kitchen b complex vitamins tablet Take 1 tablet by mouth daily.      Marland Kitchen buPROPion (WELLBUTRIN XL) 300 MG 24 hr tablet TAKE 1 TABLET BY MOUTH EVERY DAY 90 tablet 3  .  chlorthalidone (HYGROTON) 25 MG tablet TAKE 1/2-1 TABLETS (12.5-25 MG TOTAL) BY MOUTH DAILY. (Patient taking differently: take 1 tab daily.) 30 tablet 5  . Cholecalciferol (VITAMIN D) 2000 UNITS CAPS Take 1 capsule by mouth daily.     . clonazePAM (KLONOPIN) 0.5 MG tablet Take 1 tablet (0.5 mg total) by mouth 2 (two) times daily as needed. for anxiety 60 tablet 5  . Coenzyme Q10 (CO Q 10 PO) Take 1 capsule by mouth daily.    [name], [name], (DAMIANA LEAF PO) Take 700 mg by mouth daily.    Marland Kitchen diltiazem (CARDIZEM CD) 180 MG 24 hr capsule TAKE 1 CAPSULE BY MOUTH ONCE DAILY 90 capsule 3  . EPINEPHrine  (EPIPEN 2-PAK) 0.3 mg/0.3 mL IJ SOAJ injection USE AS NEEDED 2 Device 1  . gabapentin (NEURONTIN) 300 MG capsule TAKE 1 TO 2 CAPSULES BY MOUTH AT BEDTIME 180 capsule 1  . HYDROcodone-acetaminophen (NORCO/VICODIN) 5-325 MG tablet Take 0.5-1 tablets by mouth 2 (two) times daily as needed (No more than 1-2x a week use.). 52 tablet 0  . Insulin Pen Needle 29G X 22.0UR MISC 1 application by Does not apply route daily. 100 each 3  . MAGNESIUM PO Take 1 capsule by mouth daily.    Marland Kitchen NEEDLE, DISP, 23 G 23G X 1-1/2" MISC Use as directed 100 each 3  . NON FORMULARY Beufomax    . Omega-3 Fatty Acids (FISH OIL) 1000 MG CAPS Take 1 capsule by mouth 2 (two) times daily.     . potassium chloride (K-DUR,KLOR-CON) 10 MEQ tablet Take 1-2 tablets daily 60 tablet 5  . Saw Palmetto, Serenoa repens, (SAW PALMETTO PO) Take 1 capsule by mouth 2 (two) times daily.    . SYRINGE-NEEDLE, DISP, 3 ML 22G X 1-1/2" 3 ML MISC Use as directed. 100 each 3  . testosterone cypionate (DEPOTESTOSTERONE CYPIONATE) 200 MG/ML injection Inject 50 mg intramuscularly weekly 10 mL 5  . Zinc 15 MG CAPS Take by mouth.     No current facility-administered medications for this visit.    Facility-Administered Medications Ordered in Other Visits  Medication Dose Route Frequency Provider Last Rate Last Dose  . gadopentetate dimeglumine (MAGNEVIST) injection 20 mL  20 mL Intravenous Once PRN Melvenia Beam, MD        Allergies-reviewed and updated Allergies  Allergen Reactions  . Bee Venom Anaphylaxis  . Flagyl [Metronidazole Hcl]   . 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 Married 30 + years. No children, lots of critters.       Cancer Survivor- carcinoid lung cancer.   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 Martin Majestic back to work for ability to be insured.       Hobbies:  music, home repair/improvement, cats   Objective: BP 136/84   Pulse 74   Temp 98.3 F (36.8 C) (Oral)   Ht 5\' 8"  (1.727 m)   Wt 177 lb 3.2 oz (80.4 kg)   SpO2 95%   BMI 26.94 kg/m j Gen: NAD, resting comfortably HEENT: Mucous membranes are moist. Oropharynx normal Neck: no thyromegaly CV: RRR no murmurs rubs or gallops Lungs: CTAB no crackles, wheeze, rhonchi Abdomen: soft/nontender/nondistended/normal bowel sounds. No rebound or guarding.  Ext: no edema Skin: warm, dry Neuro: grossly normal, moves all extremities, PERRLA Psych: seems somewhat down today Rectal: normal tone,  normal sized prostate, no masses or tenderness  Assessment/Plan:  67 y.o. male presenting for annual physical.  Health Maintenance counseling:  1. Anticipatory guidance: Patient counseled regarding regular dental exams -q6 months, eye exams -yearly, wearing seatbelts.  2. Risk factor reduction:  Advised patient of need for regular exercise and diet rich and fruits and vegetables to reduce risk of heart attack and stroke. Exercise- falling off lately- has felt more fatigued lately- he is being pushed on hours to fill in more (also stressed as CME due at end of September). Diet- down 9 lbs from last year and now at goal of under 180 Wt Readings from Last 3 Encounters:  12/21/17 177 lb 3.2 oz (80.4 kg)  08/26/17 181 lb 6 oz (82.3 kg)  07/15/17 183 lb 12.8 oz (83.4 kg)  3. Immunizations/screenings/ancillary studies- flu shot today Immunization History  Administered Date(s) Administered  . Influenza Whole 01/04/2012  . Influenza, High Dose Seasonal PF 12/21/2017  . Influenza,inj,Quad PF,6+ Mos 12/17/2014  . Influenza-Unspecified 01/17/2014, 12/28/2015, 01/01/2017  . Pneumococcal Conjugate-13 06/17/2015  . Pneumococcal Polysaccharide-23 09/07/2016  . Td 02/27/2001, 04/10/2011  . Zoster 08/24/2010   Health Maintenance Due  Topic Date Due  . COLONOSCOPY - GI is to call him when schedule is open- he called  and they will call him back 06/20/2017  . INFLUENZA VACCINE - today 11/03/2017   4. Prostate cancer screening- will trend psa and rectal exam particularly on testosterone. Nocturia 1-2x a night. Some urgency. Seemed to be worse after riding bike  Lab Results  Component Value Date   PSA 0.31 04/07/2017   PSA 0.21 12/28/2016   PSA 0.32 06/17/2015   5. Colon cancer screening - 06/20/17 and trying to get set up for repeat but has had trouble with scheduling and GI said they would call him back 6. Skin cancer screening- [facility] dermatology group. advised regular sunscreen use. Denies worrisome, changing, or new skin lesions.   Status of chronic or acute concerns   Getting redness on shins underneath sock. Has tried different socks and still gets the issue. Just started noticing. No issues with low socks. Burned sensation.   Epi pen - refill .   Hypogonadism- follows with Dr. Dwyane Dee on testosterone.   HLD- aspirin alone. Elevated triglycerides in the past. Update lipids. Fine on last check  Depression  phq9 of 10-  On wellbutrin 300mg . Thinks fatigue and work schedule are playing into that as well trouble with weakness from his neuropathy. Also seeing chiropractor for work. We discussed nortriptyline could help from neurology- he is nervous about increasing palpitations. Counseling hasnt helped in past- he just wants to get past the next month and will call for follow up if not improving after that tough stretch  Generalized anxiety disorder GAD- on clonazepam 0.5mg  BID essentially once again- even using 2 at bedtime in past. I have advised psychiatry follow up in past which he declines. Vybriid in past - went into bigeminy and later taken off. Tends to wake up at 3 30. Doesn't remember melatonin helping in the past.  Remains on wellbutrin. Counseling hasnt been helpful on different attempts.   Essential hypertension HTN- on chlorthalidone 25mg , diltiazem 180mg  XR. switched off amlodipine due to  edema. Home #s 130s/80s- as low as 120/70. With excellent home control- continue current rx. repeat BP here looks good  Hereditary and idiopathic peripheral neuropathy Neuropathy- idiopathic. Starting to get some leg weakness. gabaepntin at night but fatigue on higher doses. Didn't start nortriptyline. Has  not tried cymbalta as suggested in past. Colleague gave him samples of lyrica which he hasnt tried either- plans to discuss with neurology next visit. Sparing vicodin 1-2x a week (refilled today as running low- but #52 lasted 6 months)- signed pain contract 08/31/16. NCCSRS is going to be printed for me to review by nursing staff.  Have advised to avoid combo with klonopin.   Chronic pain syndrome See neuropathy section  History of lung cancer Some fatigue and cough- we will update x-ray with lung cancer history. We discussed possible fatigue if symptoms persist- advised 2 month recheck. Had planned for x-ray for 3 years yearly then space out after that- this is year 2.   Addendum 12/22/17- reviewed NCCSRS- receiving vicodin and clonazepam from me. On testosterone through Dr. Dwyane Dee. No concerning distributions.   Future Appointments  Date Time Provider [place]  12/27/2017  8:30 AM [facility]  12/29/2017  9:15 AM Elayne Snare, MD [facility] None  02/08/2018  8:00 AM Narda Amber K, DO [facility] None   No follow-ups on file.  Lab/Order associations: Preventative health care  Need for prophylactic vaccination and inoculation against influenza - Plan: Flu vaccine HIGH DOSE PF  History of lung cancer - Plan: DG Chest 2 View  Essential hypertension - Plan: Comprehensive metabolic panel, Lipid panel  Screening for prostate cancer - Plan: PSA  Fatigue, unspecified type - Plan: TSH  Recurrent major depressive disorder, in partial remission (HCC)  Generalized anxiety disorder  Hereditary and idiopathic peripheral neuropathy  Chronic pain syndrome  Meds ordered  this encounter  Medications  . EPINEPHrine (EPIPEN 2-PAK) 0.3 mg/0.3 mL IJ SOAJ injection    Sig: USE AS NEEDED    Dispense:  2 Device    Refill:  1  . HYDROcodone-acetaminophen (NORCO/VICODIN) 5-325 MG tablet    Sig: Take 0.5-1 tablets by mouth 2 (two) times daily as needed (No more than 1-2x a week use.).    Dispense:  52 tablet    Refill:  0    Return precautions advised.  [name], MD

## 2017-12-21 NOTE — Patient Instructions (Addendum)
Health Maintenance Due  Topic Date Due  . COLONOSCOPY - I would call GI back in next month or two if you dont hear back 06/20/2017  . INFLUENZA VACCINE -will get at work on Saturday 11/03/2017   Make sure they include my labs from today as well as labs Dr. Dwyane Dee ordered from earlier this year  You can either do your x-ray today or when you come back on Tuesday- up to you  No changes today but lets check in 2 months from now to see how you are doing from mood, fatigue perspective and consider further options or medication changes at that time

## 2017-12-21 NOTE — Assessment & Plan Note (Signed)
GAD- on clonazepam 0.5mg  BID essentially once again- even using 2 at bedtime in past. I have advised psychiatry follow up in past which he declines. Vybriid in past - went into bigeminy and later taken off. Tends to wake up at 3 30. Doesn't remember melatonin helping in the past.  Remains on wellbutrin. Counseling hasnt been helpful on different attempts.

## 2017-12-26 ENCOUNTER — Encounter: Payer: Self-pay | Admitting: Gastroenterology

## 2017-12-27 ENCOUNTER — Other Ambulatory Visit (INDEPENDENT_AMBULATORY_CARE_PROVIDER_SITE_OTHER): Payer: 59

## 2017-12-27 DIAGNOSIS — E291 Testicular hypofunction: Secondary | ICD-10-CM

## 2017-12-27 DIAGNOSIS — R5383 Other fatigue: Secondary | ICD-10-CM

## 2017-12-27 DIAGNOSIS — Z125 Encounter for screening for malignant neoplasm of prostate: Secondary | ICD-10-CM

## 2017-12-27 DIAGNOSIS — I10 Essential (primary) hypertension: Secondary | ICD-10-CM

## 2017-12-27 DIAGNOSIS — R7301 Impaired fasting glucose: Secondary | ICD-10-CM | POA: Diagnosis not present

## 2017-12-27 LAB — LIPID PANEL
Cholesterol: 144 mg/dL (ref 0–200)
HDL: 47.1 mg/dL (ref >39.00)
LDL CALC: 83 mg/dL (ref 0–99)
NONHDL: 96.8
Total CHOL/HDL Ratio: 3
Triglycerides: 67 mg/dL (ref 0.0–149.0)
VLDL: 13.4 mg/dL (ref 0.0–40.0)

## 2017-12-27 LAB — GLUCOSE, RANDOM: GLUCOSE: 120 mg/dL — AB (ref 70–99)

## 2017-12-27 LAB — COMPREHENSIVE METABOLIC PANEL
ALBUMIN: 4.3 g/dL (ref 3.5–5.2)
ALT: 26 U/L (ref 0–53)
AST: 24 U/L (ref 0–37)
Alkaline Phosphatase: 57 U/L (ref 39–117)
BUN: 17 mg/dL (ref 6–23)
CHLORIDE: 99 meq/L (ref 96–112)
CO2: 31 mEq/L (ref 19–32)
Calcium: 9.7 mg/dL (ref 8.4–10.5)
Creatinine, Ser: 1.03 mg/dL (ref 0.40–1.50)
GFR: 76.42 mL/min (ref >60.00)
GLUCOSE: 120 mg/dL — AB (ref 70–99)
POTASSIUM: 3.5 meq/L (ref 3.5–5.1)
SODIUM: 138 meq/L (ref 135–145)
Total Bilirubin: 1.2 mg/dL (ref 0.2–1.2)
Total Protein: 6.7 g/dL (ref 6.0–8.3)

## 2017-12-27 LAB — HEMOGLOBIN A1C: HEMOGLOBIN A1C: 5.4 % (ref 4.6–6.5)

## 2017-12-27 LAB — TSH: TSH: 2.7 u[IU]/mL (ref 0.35–4.50)

## 2017-12-27 LAB — CBC
HEMATOCRIT: 49.8 % (ref 39.0–52.0)
HEMOGLOBIN: 17.4 g/dL — AB (ref 13.0–17.0)
MCHC: 34.9 g/dL (ref 30.0–36.0)
MCV: 95 fl (ref 78.0–100.0)
Platelets: 229 10*3/uL (ref 150.0–400.0)
RBC: 5.25 Mil/uL (ref 4.22–5.81)
RDW: 12 % (ref 11.5–15.5)
WBC: 8.5 10*3/uL (ref 4.0–10.5)

## 2017-12-27 LAB — TESTOSTERONE: Testosterone: 634.55 ng/dL (ref 300.00–890.00)

## 2017-12-27 LAB — PSA: PSA: 0.32 ng/mL (ref 0.10–4.00)

## 2017-12-29 ENCOUNTER — Encounter: Payer: Self-pay | Admitting: Endocrinology

## 2017-12-29 ENCOUNTER — Ambulatory Visit (INDEPENDENT_AMBULATORY_CARE_PROVIDER_SITE_OTHER): Payer: 59 | Admitting: Endocrinology

## 2017-12-29 VITALS — BP 110/86 | HR 88 | Ht 68.0 in | Wt 178.0 lb

## 2017-12-29 DIAGNOSIS — E291 Testicular hypofunction: Secondary | ICD-10-CM | POA: Diagnosis not present

## 2017-12-29 DIAGNOSIS — R7301 Impaired fasting glucose: Secondary | ICD-10-CM | POA: Diagnosis not present

## 2017-12-29 MED ORDER — TESTOSTERONE CYPIONATE 100 MG/ML IM SOLN: 40.0000 mg | INTRAMUSCULAR | 0 refills | Status: DC

## 2017-12-29 MED ORDER — "NEEDLE (DISP) 23G X 1"" MISC": 0 refills | Status: DC

## 2017-12-29 NOTE — Patient Instructions (Signed)
0.36ml weekly

## 2017-12-29 NOTE — Progress Notes (Signed)
Patient ID: [name] Page, male   DOB: 10/31/1950, 67 y.o.   MRN: [id number]             Chief complaint: Endocrinology follow-up  History of Present Illness   HYPOGONADISM:  Hypogonadismwas diagnosed in ?  2012  He at that time had complaints offatigue, some increased depression, decreased libido although he does not remember symptoms well There is no history of the following: Hot flushes, sweats, breast enlargement, long term anabolic steroid use, history of testicular injury, head injury or mumps in childhood. No history of osteopenia or low impact fracture  At that time an afternoon testosterone level was 214 and free testosterone level low at 32, normal >47   He was also evaluated by a urologist and details are not available He was treated with testosterone gel but he thinks this did not improve his testosterone levels and likely did not continue treatment  Subsequently he was again evaluated in 2015 and with a low testosterone of 239 he was started on testosterone injections With this he thinks she had some improvement in his fatigue and depression His dose had been adjusted from his initial starting dose of 100 mg every 2 weeks and he has been on various regimens including every 3 or 4 weeks injections His testosterone level has been quite variable since about 2016  RECENT history:  He has been taking 50 mg of testosterone every week and takes this on Fridays  Because of his increased hematocrit on his initial consultation he was told to injection every week instead of every other week and reduce the dose More recently after stabilization of his dose his testosterone level had been fairly consistent  He does not think he has any unusual fatigue; he thinks his libido does not improve despite testosterone injections and good testosterone levels  Has been very regular with his injections and is doing them in his thigh using a 1-1/2 inch needle  The testosterone  level is now relatively higher at 634 drawn on a Tuesday with the injection being on Friday Also hemoglobin is upper normal  Free testosterone level in February was 16.9, upper normal    His lab results showtestosterone levels as follows:   Lab Results  Component Value Date   TESTOSTERONE 634.55 12/27/2017   TESTOSTERONE 443.53 06/28/2017   TESTOSTERONE 823 05/31/2017   TESTOSTERONE 214.06 (L) 05/04/2017   Lab Results  Component Value Date   HGB 17.4 (H) 12/27/2017    Prolactin level: Normal   Lab Results  Component Value Date   LH <0.20 (L) 04/07/2017   Lab Results  Component Value Date   HGB 17.4 (H) 12/27/2017        Allergies as of 12/29/2017      Reactions   Bee Venom Anaphylaxis   Flagyl [metronidazole Hcl]    Nsaids    REACTION: intolereance      Medication List        Accurate as of 12/29/17  9:33 AM. Always use your most recent med list.          ACETYL L-CARNITINE PO Take by mouth.   ACETYLCYSTEINE PO Take by mouth.   ALPHA-LIPOIC ACID PO Take by mouth.   aspirin EC 81 MG tablet Take 81 mg by mouth daily.   b complex vitamins tablet Take 1 tablet by mouth daily.   buPROPion 300 MG 24 hr tablet Commonly known as:  WELLBUTRIN XL TAKE 1 TABLET BY MOUTH EVERY DAY   chlorthalidone  25 MG tablet Commonly known as:  HYGROTON TAKE 1/2-1 TABLETS (12.5-25 MG TOTAL) BY MOUTH DAILY.   clonazePAM 0.5 MG tablet Commonly known as:  KLONOPIN Take 1 tablet (0.5 mg total) by mouth 2 (two) times daily as needed. for anxiety   CO Q 10 PO Take 1 capsule by mouth daily.   DAMIANA LEAF PO Take 700 mg by mouth daily.   diltiazem 180 MG 24 hr capsule Commonly known as:  CARDIZEM CD TAKE 1 CAPSULE BY MOUTH ONCE DAILY   EPINEPHrine 0.3 mg/0.3 mL Soaj injection Commonly known as:  EPI-PEN USE AS NEEDED   Fish Oil 1000 MG Caps Take 1 capsule by mouth 2 (two) times daily.   gabapentin 300 MG capsule Commonly known as:  NEURONTIN TAKE 1  TO 2 CAPSULES BY MOUTH AT BEDTIME   HYDROcodone-acetaminophen 5-325 MG tablet Commonly known as:  NORCO/VICODIN Take 0.5-1 tablets by mouth 2 (two) times daily as needed (No more than 1-2x a week use.).   Insulin Pen Needle 29G X 28.3TD Misc 1 application by Does not apply route daily.   MAGNESIUM PO Take 1 capsule by mouth daily.   NEEDLE (DISP) 23 G 23G X 1-1/2" Misc Use as directed   NON FORMULARY Beufomax   potassium chloride 10 MEQ tablet Commonly known as:  K-DUR,KLOR-CON Take 1-2 tablets daily   SAW PALMETTO PO Take 1 capsule by mouth 2 (two) times daily.   SYRINGE-NEEDLE (DISP) 3 ML 22G X 1-1/2" 3 ML Misc Use as directed.   testosterone cypionate 200 MG/ML injection Commonly known as:  DEPOTESTOSTERONE CYPIONATE Inject 50 mg intramuscularly weekly   Vitamin D 2000 units Caps Take 1 capsule by mouth daily.   Zinc 15 MG Caps Take by mouth.       Allergies:  Allergies  Allergen Reactions  . Bee Venom Anaphylaxis  . Flagyl [Metronidazole Hcl]   . Nsaids     REACTION: intolereance    Past Medical History:  Diagnosis Date  . Allergic rhinitis, cause unspecified   . 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 Depressive disorder, not elsewhere classified   . Esophageal reflux   . HTN (hypertension)   . Lymphocytic colitis   . Neuropathy   . Other testicular hypofunction   . Personal history of urinary calculi     Past Surgical History:  Procedure Laterality Date  . 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 parotid tumor surgery    . TONSILLECTOMY AND ADENOIDECTOMY      Family History  Problem Relation Age of Onset  . Hypertension Mother   . Anxiety disorder Mother   . Non-Hodgkin's lymphoma Mother   . Dementia Father   . Depression Father        and anxiety  . Hypertension Father   . Sudden death Paternal Uncle 10  . Hypertension Brother   . Early death Neg Hx   . Heart disease Neg Hx         mother in 72s had lesoin    Social History:  reports that he has never smoked. He has never used smokeless tobacco. He reports that he drinks alcohol. He reports that he does not use drugs.  Review of Systems  IMPAIRED fasting glucose:  Although his A1c is normal consistently his fasting glucose is consistently high He is trying to be active as before and has lost some weight gradually this summer Has not had a  GTT previously   Wt Readings from Last 3 Encounters:  12/29/17 178 lb (80.7 kg)  12/21/17 177 lb 3.2 oz (80.4 kg)  08/26/17 181 lb 6 oz (82.3 kg)   Lab Results  Component Value Date   GLUCOSE 120 (H) 12/27/2017   GLUCOSE 120 (H) 12/27/2017   GLUCOSE 118 (H) 10/05/2017   Lab Results  Component Value Date   HGBA1C 5.4 12/27/2017   HGBA1C 5.6 04/07/2017   HGBA1C 5.5 12/28/2016   Lab Results  Component Value Date   LDLCALC 83 12/27/2017   CREATININE 1.03 12/27/2017    General Examination:   BP 110/86   Pulse 88   Ht 5\' 8"  (1.727 m)   Wt 178 lb (80.7 kg)   BMI 27.06 kg/m    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  Also may have component of secondary hypogonadism with metabolic syndrome  Currently with his doses of 50 mg Depo-Testosterone once a week he is doing subjectively well with no unusual fatigue Testosterone level has been consistently normal However it has gone up significantly and is over 600 now Also his hemoglobin is upper normal He is consistent with his injections every Friday  IMPAIRED fasting glucose with normal A1c Glucose is 120 which is significantly high However not clear if he has glucose intolerance    Plan:  Reduce the testosterone to 40 mg every week  Testosterone level to be rechecked in 4 months  Continue efforts to lose weight  Schedule 2-hour glucose tolerance test on the next visit Follow-up CBC again on the next visit   Elayne Snare 12/29/2017, 9:33 AM       Note: This office note was prepared with Dragon voice recognition system technology. Any transcriptional errors that result from this process are unintentional.

## 2018-01-02 ENCOUNTER — Other Ambulatory Visit: Payer: Self-pay | Admitting: Family Medicine

## 2018-01-02 MED FILL — clonazePAM 0.5 MG TABS: 0.5 | 30 days supply | Qty: 60 | Fill #4

## 2018-01-02 MED FILL — CHLORTHALIDONE 25 MG TABS: 25 | 30 days supply | Qty: 30 | Fill #0

## 2018-01-06 ENCOUNTER — Telehealth: Payer: Self-pay | Admitting: Endocrinology

## 2018-01-06 NOTE — Telephone Encounter (Signed)
PA- for testoterone--been started today on cover [email or web address]

## 2018-01-11 ENCOUNTER — Encounter: Payer: Self-pay | Admitting: *Deleted

## 2018-01-11 NOTE — Telephone Encounter (Signed)
PA has been approved for testosterone from 01/09/18 to 01/09/19 ref # [id number]

## 2018-01-12 ENCOUNTER — Encounter: Payer: Self-pay | Admitting: *Deleted

## 2018-01-12 ENCOUNTER — Telehealth: Payer: Self-pay | Admitting: Endocrinology

## 2018-01-12 NOTE — Telephone Encounter (Signed)
[employer] approval PA reference--6106--Testoterone cypionate 100mg /ml. 01/10/18

## 2018-01-24 ENCOUNTER — Encounter: Payer: Self-pay | Admitting: Family Medicine

## 2018-01-24 ENCOUNTER — Other Ambulatory Visit: Payer: Self-pay

## 2018-01-24 DIAGNOSIS — E876 Hypokalemia: Secondary | ICD-10-CM

## 2018-01-24 MED ORDER — POTASSIUM CHLORIDE CRYS ER 10 MEQ PO TBCR: EXTENDED_RELEASE_TABLET | ORAL | 1 refills | Status: DC

## 2018-01-24 MED FILL — POTASSIUM CHL ER M10 TABLET: 10 | 90 days supply | Qty: 180 | Fill #0

## 2018-01-25 DIAGNOSIS — H52203 Unspecified astigmatism, bilateral: Secondary | ICD-10-CM | POA: Diagnosis not present

## 2018-01-25 DIAGNOSIS — H524 Presbyopia: Secondary | ICD-10-CM | POA: Diagnosis not present

## 2018-01-28 ENCOUNTER — Other Ambulatory Visit: Payer: Self-pay | Admitting: Neurology

## 2018-01-28 DIAGNOSIS — R42 Dizziness and giddiness: Secondary | ICD-10-CM

## 2018-01-28 DIAGNOSIS — I639 Cerebral infarction, unspecified: Secondary | ICD-10-CM

## 2018-01-28 DIAGNOSIS — H814 Vertigo of central origin: Secondary | ICD-10-CM

## 2018-01-30 ENCOUNTER — Encounter: Payer: Self-pay | Admitting: Gastroenterology

## 2018-01-31 ENCOUNTER — Telehealth: Payer: Self-pay | Admitting: Neurology

## 2018-01-31 NOTE — Telephone Encounter (Signed)
MR Brain wo contrast Dr. Lianne Bushy [facility] Auth: [place] Ref # [id number]. Patient is scheduled for 02/22/18 that is the day he requested.

## 2018-02-02 ENCOUNTER — Telehealth: Payer: Self-pay | Admitting: Endocrinology

## 2018-02-02 ENCOUNTER — Encounter: Payer: Self-pay | Admitting: *Deleted

## 2018-02-02 NOTE — Telephone Encounter (Signed)
PA [id number] from 01/09/18-01/09/19

## 2018-02-03 ENCOUNTER — Other Ambulatory Visit: Payer: Self-pay | Admitting: Family Medicine

## 2018-02-03 MED FILL — GABAPENTIN 300 MG CAPSULE: 300 | 90 days supply | Qty: 180 | Fill #0

## 2018-02-03 MED FILL — clonazePAM 0.5 MG TABS: 0.5 | 30 days supply | Qty: 60 | Fill #5

## 2018-02-03 MED FILL — CHLORTHALIDONE 25 MG TABS: 25 | 30 days supply | Qty: 30 | Fill #1

## 2018-02-03 NOTE — Telephone Encounter (Signed)
Yes thanks you may refill under my name

## 2018-02-03 NOTE — Telephone Encounter (Signed)
Last filled by Dr Tamala Julian, pt has not seen Dr Tamala Julian since 08/2016. Please advise if your are okay with refilling

## 2018-02-08 ENCOUNTER — Ambulatory Visit: Payer: Self-pay | Admitting: Neurology

## 2018-02-15 ENCOUNTER — Encounter: Payer: Self-pay | Admitting: Gastroenterology

## 2018-02-15 NOTE — Telephone Encounter (Signed)
The PA approval below is for testosterone

## 2018-02-16 ENCOUNTER — Telehealth: Payer: Self-pay | Admitting: Endocrinology

## 2018-02-16 NOTE — Telephone Encounter (Signed)
[employer]--Rx testosterone --allowing 10 ml per 30 days-approved--01/09/18-01/09/2019.

## 2018-02-21 MED FILL — buPROPion HCL ER (XL) 300 M: 300 | 90 days supply | Qty: 90 | Fill #2

## 2018-02-22 ENCOUNTER — Ambulatory Visit (INDEPENDENT_AMBULATORY_CARE_PROVIDER_SITE_OTHER): Payer: 59

## 2018-02-22 DIAGNOSIS — L819 Disorder of pigmentation, unspecified: Secondary | ICD-10-CM | POA: Diagnosis not present

## 2018-02-22 DIAGNOSIS — I639 Cerebral infarction, unspecified: Secondary | ICD-10-CM

## 2018-02-22 DIAGNOSIS — R42 Dizziness and giddiness: Secondary | ICD-10-CM | POA: Diagnosis not present

## 2018-02-22 DIAGNOSIS — D1801 Hemangioma of skin and subcutaneous tissue: Secondary | ICD-10-CM | POA: Diagnosis not present

## 2018-02-22 DIAGNOSIS — D229 Melanocytic nevi, unspecified: Secondary | ICD-10-CM | POA: Diagnosis not present

## 2018-02-22 DIAGNOSIS — L814 Other melanin hyperpigmentation: Secondary | ICD-10-CM | POA: Diagnosis not present

## 2018-02-22 DIAGNOSIS — L821 Other seborrheic keratosis: Secondary | ICD-10-CM | POA: Diagnosis not present

## 2018-02-22 DIAGNOSIS — H814 Vertigo of central origin: Secondary | ICD-10-CM | POA: Diagnosis not present

## 2018-02-22 DIAGNOSIS — D692 Other nonthrombocytopenic purpura: Secondary | ICD-10-CM | POA: Diagnosis not present

## 2018-02-23 ENCOUNTER — Telehealth: Payer: Self-pay | Admitting: Neurology

## 2018-02-23 NOTE — Telephone Encounter (Signed)
  I called the patient.  The MRI of the brain is really unremarkable, the patient still has ongoing opacification of the left mastoid air cells, no change from December 2018.  I discussed this with the patient.   MRI brain 02/23/18:  IMPRESSION:   Stable MRI scan of the brain showing minor age-related changes of chronic microvascular ischemia and bilateral mucous retention cyst/polyp in the maxillary antrum.  The left mastoid opacification is also unchanged.  Overall no significant change compared with MRI dated 03/09/2017.

## 2018-02-24 NOTE — Telephone Encounter (Signed)
Spoke with pt. to review MRI report; he sts. Dr. Jannifer Franklin called and spoke with him yesterday.  He has no questions today/fim

## 2018-03-01 ENCOUNTER — Other Ambulatory Visit: Payer: Self-pay | Admitting: Family Medicine

## 2018-03-01 MED FILL — CHLORTHALIDONE 25 MG TABS: 25 | 30 days supply | Qty: 30 | Fill #2

## 2018-03-01 NOTE — Telephone Encounter (Signed)
Pt requesting refill Clonazepam, written 09/05/2017 # 60 with 5 refills. Last seen 12/21/2017 for CPE.

## 2018-03-03 MED FILL — clonazePAM 0.5 MG TABS: 0.5 | 30 days supply | Qty: 60 | Fill #0

## 2018-03-06 ENCOUNTER — Other Ambulatory Visit: Payer: Self-pay | Admitting: Endocrinology

## 2018-03-06 ENCOUNTER — Telehealth: Payer: Self-pay

## 2018-03-06 MED ORDER — TESTOSTERONE CYPIONATE 100 MG/ML IM SOLN: 40.0000 mg | INTRAMUSCULAR | 5 refills | Status: DC

## 2018-03-06 NOTE — Telephone Encounter (Signed)
Would you please refill pt testosterone?

## 2018-03-07 MED FILL — CARTIA XT 180 MG CAPSULE SA: 180 | 90 days supply | Qty: 90 | Fill #2

## 2018-03-08 ENCOUNTER — Encounter: Payer: Self-pay | Admitting: Gastroenterology

## 2018-03-08 ENCOUNTER — Ambulatory Visit (AMBULATORY_SURGERY_CENTER): Payer: Self-pay | Admitting: *Deleted

## 2018-03-08 VITALS — Ht 68.0 in | Wt 175.0 lb

## 2018-03-08 DIAGNOSIS — Z1211 Encounter for screening for malignant neoplasm of colon: Secondary | ICD-10-CM

## 2018-03-08 MED ORDER — NA SULFATE-K SULFATE-MG SULF 17.5-3.13-1.6 GM/177ML PO SOLN: 1.0000 | Freq: Once | ORAL | 0 refills | Status: AC

## 2018-03-08 MED FILL — SUPREP BOWEL PREP KIT: 17.5-3.13-1 | 1 days supply | Qty: 354 | Fill #0

## 2018-03-08 NOTE — Progress Notes (Signed)
No egg or soy allergy known to patient  No issues with past sedation with any surgeries  or procedures, no intubation problems  No diet pills per patient No home 02 use per patient  No blood thinners per patient  Pt denies issues with constipation  No A fib or A flutter  EMMI video sent to pt's e mail pt declined   

## 2018-03-09 ENCOUNTER — Other Ambulatory Visit (INDEPENDENT_AMBULATORY_CARE_PROVIDER_SITE_OTHER): Payer: 59

## 2018-03-09 DIAGNOSIS — E876 Hypokalemia: Secondary | ICD-10-CM

## 2018-03-09 LAB — BASIC METABOLIC PANEL
BUN: 17 mg/dL (ref 6–23)
CO2: 31 mEq/L (ref 19–32)
Calcium: 9.6 mg/dL (ref 8.4–10.5)
Chloride: 97 mEq/L (ref 96–112)
Creatinine, Ser: 1.14 mg/dL (ref 0.40–1.50)
GFR: 67.94 mL/min (ref >60.00)
Glucose, Bld: 121 mg/dL — ABNORMAL HIGH (ref 70–99)
Potassium: 3.2 mEq/L — ABNORMAL LOW (ref 3.5–5.1)
SODIUM: 138 meq/L (ref 135–145)

## 2018-03-10 ENCOUNTER — Other Ambulatory Visit: Payer: Self-pay

## 2018-03-10 DIAGNOSIS — E876 Hypokalemia: Secondary | ICD-10-CM

## 2018-03-10 MED ORDER — POTASSIUM CHLORIDE CRYS ER 10 MEQ PO TBCR: 10.0000 meq | EXTENDED_RELEASE_TABLET | Freq: Three times a day (TID) | ORAL | 5 refills | Status: DC

## 2018-03-15 ENCOUNTER — Ambulatory Visit (AMBULATORY_SURGERY_CENTER): Payer: 59 | Admitting: Gastroenterology

## 2018-03-15 ENCOUNTER — Encounter: Payer: Self-pay | Admitting: Gastroenterology

## 2018-03-15 VITALS — BP 119/74 | HR 64 | Temp 97.1°F | Resp 20 | Ht 68.0 in | Wt 178.0 lb

## 2018-03-15 DIAGNOSIS — D122 Benign neoplasm of ascending colon: Secondary | ICD-10-CM | POA: Diagnosis not present

## 2018-03-15 DIAGNOSIS — D127 Benign neoplasm of rectosigmoid junction: Secondary | ICD-10-CM

## 2018-03-15 DIAGNOSIS — Z1211 Encounter for screening for malignant neoplasm of colon: Secondary | ICD-10-CM | POA: Diagnosis not present

## 2018-03-15 DIAGNOSIS — D120 Benign neoplasm of cecum: Secondary | ICD-10-CM | POA: Diagnosis not present

## 2018-03-15 MED ORDER — SODIUM CHLORIDE 0.9 % IV SOLN: 500.0000 mL | Freq: Once | INTRAVENOUS | Status: DC

## 2018-03-15 MED FILL — POTASSIUM CHL ER M10 TABLET: 10 | 30 days supply | Qty: 90 | Fill #0

## 2018-03-15 NOTE — Progress Notes (Signed)
PT taken to PACU. Monitors in place. VSS. Report given to RN. 

## 2018-03-15 NOTE — Progress Notes (Signed)
Pt's states no medical or surgical changes since previsit or office visit. 

## 2018-03-15 NOTE — Patient Instructions (Signed)
Await pathology results. Continue present medications. Please read handouts provided.     YOU HAD AN ENDOSCOPIC PROCEDURE TODAY AT [facility]:   Refer to the procedure report that was given to you for any specific questions about what was found during the examination.  If the procedure report does not answer your questions, please call your gastroenterologist to clarify.  If you requested that your care partner not be given the details of your procedure findings, then the procedure report has been included in a sealed envelope for you to review at your convenience later.  YOU SHOULD EXPECT: Some feelings of bloating in the abdomen. Passage of more gas than usual.  Walking can help get rid of the air that was put into your GI tract during the procedure and reduce the bloating. If you had a lower endoscopy (such as a colonoscopy or flexible sigmoidoscopy) you may notice spotting of blood in your stool or on the toilet paper. If you underwent a bowel prep for your procedure, you may not have a normal bowel movement for a few days.  Please Note:  You might notice some irritation and congestion in your nose or some drainage.  This is from the oxygen used during your procedure.  There is no need for concern and it should clear up in a day or so.  SYMPTOMS TO REPORT IMMEDIATELY:   Following lower endoscopy (colonoscopy or flexible sigmoidoscopy):  Excessive amounts of blood in the stool  Significant tenderness or worsening of abdominal pains  Swelling of the abdomen that is new, acute  Fever of 100F or higher    For urgent or emergent issues, a gastroenterologist can be reached at any hour by calling [phone].   DIET:  We do recommend a small meal at first, but then you may proceed to your regular diet.  Drink plenty of fluids but you should avoid alcoholic beverages for 24 hours.  ACTIVITY:  You should plan to take it easy for the rest of today and you should NOT DRIVE  or use heavy machinery until tomorrow (because of the sedation medicines used during the test).    FOLLOW UP: Our staff will call the number listed on your records the next business day following your procedure to check on you and address any questions or concerns that you may have regarding the information given to you following your procedure. If we do not reach you, we will leave a message.  However, if you are feeling well and you are not experiencing any problems, there is no need to return our call.  We will assume that you have returned to your regular daily activities without incident.  If any biopsies were taken you will be contacted by phone or by letter within the next 1-3 weeks.  Please call us at [phone] if you have not heard about the biopsies in 3 weeks.    SIGNATURES/CONFIDENTIALITY: You and/or your care partner have signed paperwork which will be entered into your electronic medical record.  These signatures attest to the fact that that the information above on your After Visit Summary has been reviewed and is understood.  Full responsibility of the confidentiality of this discharge information lies with you and/or your care-partner.

## 2018-03-15 NOTE — Op Note (Signed)
[facility] Patient Name: [name] Page Procedure Date: 03/15/2018 9:05 AM MRN: [id number] Endoscopist: Remo Lipps P. Havery Moros , MD Age: 67 Referring MD:  Date of Birth: [birth year] Gender: Male Account #: [id number] Procedure:                Colonoscopy Indications:              Screening for colorectal malignant neoplasm, of                            note, patient with intermittent loose stools and                            history of lymphocytic colitis Medicines:                Monitored Anesthesia Care Procedure:                Pre-Anesthesia Assessment:                           - Prior to the procedure, a History and Physical                            was performed, and patient medications and                            allergies were reviewed. The patient's tolerance of                            previous anesthesia was also reviewed. The risks                            and benefits of the procedure and the sedation                            options and risks were discussed with the patient.                            All questions were answered, and informed consent                            was obtained. Prior Anticoagulants: The patient has                            taken no previous anticoagulant or antiplatelet                            agents. ASA Grade Assessment: II - A patient with                            mild systemic disease. After reviewing the risks                            and benefits, the patient was deemed in  satisfactory condition to undergo the procedure.                           After obtaining informed consent, the colonoscope                            was passed under direct vision. Throughout the                            procedure, the patient's blood pressure, pulse, and                            oxygen saturations were monitored continuously. The                            Model CF-HQ190L  [id number]) scope was introduced                            through the anus and advanced to the the cecum,                            identified by appendiceal orifice and ileocecal                            valve. The colonoscopy was performed without                            difficulty. The patient tolerated the procedure                            well. The quality of the bowel preparation was                            adequate. The ileocecal valve, appendiceal orifice,                            and rectum were photographed. Scope In: 9:09:18 AM Scope Out: 9:42:07 AM Scope Withdrawal Time: 0 hours 25 minutes 40 seconds  Total Procedure Duration: 0 hours 32 minutes 49 seconds  Findings:                 The digital rectal exam findings include perianal                            rash.                           A diminutive polyp was found in the cecum. The                            polyp was sessile. The polyp was removed with a                            cold biopsy forceps. Resection and retrieval were  complete.                           A diminutive polyp was found in the ascending                            colon. The polyp was sessile. The polyp was removed                            with a cold biopsy forceps. Resection and retrieval                            were complete.                           A 5 to 6 mm polyp was found in the recto-sigmoid                            colon. The polyp was semi-pedunculated. The polyp                            was removed with a hot snare. Resection and                            retrieval were complete.                           Multiple small-mouthed diverticula were found in                            the sigmoid colon.                           The exam was otherwise without abnormality other                            than small hemorrhoids.                           Biopsies for histology were taken  with a cold                            forceps from the right colon, left colon and                            transverse colon for evaluation of microscopic                            colitis. Complications:            No immediate complications. Estimated blood loss:                            Minimal. Estimated Blood Loss:     Estimated blood loss was minimal. Impression:               -  Perianal rash found on digital rectal exam.                           - One diminutive polyp in the cecum, removed with a                            cold biopsy forceps. Resected and retrieved.                           - One diminutive polyp in the ascending colon,                            removed with a cold biopsy forceps. Resected and                            retrieved.                           - One 5 to 6 mm polyp at the recto-sigmoid colon,                            removed with a hot snare. Resected and retrieved.                           - Diverticulosis in the sigmoid colon.                           - The examination was otherwise normal.                           - Small hemorrhoids                           - Biopsies were taken with a cold forceps from the                            right colon, left colon and transverse colon for                            evaluation of microscopic colitis. Recommendation:           - Patient has a contact number available for                            emergencies. The signs and symptoms of potential                            delayed complications were discussed with the                            patient. Return to normal activities tomorrow.                            Written discharge instructions were provided to the  patient.                           - Resume previous diet.                           - Continue present medications.                           - Await pathology results. Remo Lipps P. Armbruster,  MD 03/15/2018 9:48:24 AM This report has been signed electronically.

## 2018-03-16 ENCOUNTER — Encounter: Payer: Self-pay | Admitting: Family Medicine

## 2018-03-16 ENCOUNTER — Telehealth: Payer: Self-pay | Admitting: *Deleted

## 2018-03-16 NOTE — Telephone Encounter (Signed)
  Follow up Call-  Call back number 03/15/2018  Post procedure Call Back phone  # [phone]  Permission to leave phone message Yes  Some recent data might be hidden     Patient questions:  Do you have a fever, pain , or abdominal swelling? No. Pain Score  0 *  Have you tolerated food without any problems? Yes.    Have you been able to return to your normal activities? Yes.    Do you have any questions about your discharge instructions: Diet   No. Medications  No. Follow up visit  No.  Do you have questions or concerns about your Care? No.  Actions: * If pain score is 4 or above: No action needed, pain <4.

## 2018-03-21 ENCOUNTER — Other Ambulatory Visit: Payer: Self-pay | Admitting: Endocrinology

## 2018-03-22 MED FILL — TESTOSTERONE CYP 200 MG/ML: 200 | 84 days supply | Qty: 3 | Fill #0

## 2018-03-24 ENCOUNTER — Encounter: Payer: Self-pay | Admitting: Family Medicine

## 2018-03-24 DIAGNOSIS — Z860101 Personal history of adenomatous and serrated colon polyps: Secondary | ICD-10-CM | POA: Insufficient documentation

## 2018-03-24 DIAGNOSIS — Z86010 Personal history of colonic polyps: Secondary | ICD-10-CM | POA: Insufficient documentation

## 2018-03-31 ENCOUNTER — Other Ambulatory Visit (INDEPENDENT_AMBULATORY_CARE_PROVIDER_SITE_OTHER): Payer: 59

## 2018-03-31 DIAGNOSIS — R7301 Impaired fasting glucose: Secondary | ICD-10-CM

## 2018-03-31 DIAGNOSIS — E876 Hypokalemia: Secondary | ICD-10-CM

## 2018-03-31 DIAGNOSIS — E291 Testicular hypofunction: Secondary | ICD-10-CM

## 2018-03-31 LAB — BASIC METABOLIC PANEL
BUN: 17 mg/dL (ref 6–23)
CO2: 30 mEq/L (ref 19–32)
Calcium: 9.3 mg/dL (ref 8.4–10.5)
Chloride: 99 mEq/L (ref 96–112)
Creatinine, Ser: 1.14 mg/dL (ref 0.40–1.50)
GFR: 67.92 mL/min (ref >60.00)
Glucose, Bld: 110 mg/dL — ABNORMAL HIGH (ref 70–99)
Potassium: 3.6 mEq/L (ref 3.5–5.1)
Sodium: 140 mEq/L (ref 135–145)

## 2018-03-31 LAB — CBC
HCT: 47 % (ref 39.0–52.0)
Hemoglobin: 16.9 g/dL (ref 13.0–17.0)
MCHC: 35.9 g/dL (ref 30.0–36.0)
MCV: 94.4 fl (ref 78.0–100.0)
PLATELETS: 238 10*3/uL (ref 150.0–400.0)
RBC: 4.97 Mil/uL (ref 4.22–5.81)
RDW: 11.8 % (ref 11.5–15.5)
WBC: 7.5 10*3/uL (ref 4.0–10.5)

## 2018-03-31 LAB — GLUCOSE TOLERANCE, 2 HOURS
Glucose, 1 Hour GTT: 174 mg/dL
Glucose, 2 hour: 103 mg/dL
Glucose, Fasting: 105 mg/dL — ABNORMAL HIGH (ref 70–99)

## 2018-03-31 LAB — TESTOSTERONE: Testosterone: 491.92 ng/dL (ref 300.00–890.00)

## 2018-04-05 MED FILL — CHLORTHALIDONE 25 MG TABS: 25 | 30 days supply | Qty: 30 | Fill #3

## 2018-04-05 MED FILL — clonazePAM 0.5 MG TABS: 0.5 | 30 days supply | Qty: 60 | Fill #1

## 2018-04-05 NOTE — Progress Notes (Signed)
Patient ID: [name] Page, male   DOB: [birth year], 68 y.o.   MRN: [id number]             Chief complaint: Endocrinology follow-up  History of Present Illness   HYPOGONADISM:  Hypogonadismwas diagnosed in ?  2012  He at that time had complaints offatigue, some increased depression, decreased libido although he does not remember symptoms well There is no history of the following: Hot flushes, sweats, breast enlargement, long term anabolic steroid use, history of testicular injury, head injury or mumps in childhood. No history of osteopenia or low impact fracture  At that time an afternoon testosterone level was 214 and free testosterone level low at 32, normal >47   He was also evaluated by a urologist and details are not available He was treated with testosterone gel but he thinks this did not improve his testosterone levels and likely did not continue treatment  Subsequently he was again evaluated in 2015 and with a low testosterone of 239 he was started on testosterone injections With this he thinks she had some improvement in his fatigue and depression His dose had been adjusted from his initial starting dose of 100 mg every 2 weeks and he has been on various regimens including every 3 or 4 weeks injections His testosterone level has been quite variable since about 2016  RECENT history:  He has been taking 50 mg of testosterone every week and takes this on Fridays  Because of his increased hematocrit on his initial consultation he was told to injection every week instead of every other week and reduce the dose Generally his level of fatigue is mild persistent but his libido does not improve despite testosterone injections and good testosterone levels  Has been very regular with his injections and is doing them in his thigh using a 1 inch needle and is using a 1 cc syringe currently  He was switched from 200 down to 100 mg/mL testosterone formulation because of  requiring smaller doses now Also he was supposed to reduce the dose to 40 mg weekly instead of 50 previously because of his testosterone being higher over 600 in September  In the last couple of weeks he has been feeling more fatigued The testosterone level is now however still fairly good at 491 even though by mistake in the last 3 injections he has taken only 20 mg on the new formulation of the testosterone instead of 40 mg weekly His lab work done on Friday with his injection being on Tuesday  However hemoglobin has come back to normal now    His lab results showtestosterone levels as follows:   Lab Results  Component Value Date   TESTOSTERONE 491.92 03/31/2018   TESTOSTERONE 634.55 12/27/2017   TESTOSTERONE 443.53 06/28/2017   TESTOSTERONE 823 05/31/2017   Lab Results  Component Value Date   HGB 16.9 03/31/2018    Prolactin level: Normal   Lab Results  Component Value Date   LH <0.20 (L) 04/07/2017   Lab Results  Component Value Date   HGB 16.9 03/31/2018        Allergies as of 04/06/2018      Reactions   Bee Venom Anaphylaxis   Flagyl [metronidazole Hcl] Other (See Comments)   Neuropathy    Nsaids    REACTION: intolereance      Medication List       Accurate as of April 06, 2018  9:20 AM. Always use your most recent med list.  ACETYL L-CARNITINE PO Take by mouth.   ACETYLCYSTEINE PO Take by mouth.   ALPHA-LIPOIC ACID PO Take by mouth.   aspirin EC 81 MG tablet Take 81 mg by mouth daily.   b complex vitamins tablet Take 1 tablet by mouth daily.   buPROPion 300 MG 24 hr tablet Commonly known as:  WELLBUTRIN XL TAKE 1 TABLET BY MOUTH EVERY DAY   chlorthalidone 25 MG tablet Commonly known as:  HYGROTON TAKE 1/2-1 TABLETS (12.5-25 MG TOTAL) BY MOUTH DAILY.   clonazePAM 0.5 MG tablet Commonly known as:  KLONOPIN TAKE 1 TABLET BY MOUTH TWICE DAILY AS NEEDED FOR ANXIETY   CO Q 10 PO Take 1 capsule by mouth daily.   DAMIANA  LEAF PO Take 700 mg by mouth daily.   diltiazem 180 MG 24 hr capsule Commonly known as:  CARDIZEM CD TAKE 1 CAPSULE BY MOUTH ONCE DAILY   EPINEPHrine 0.3 mg/0.3 mL Soaj injection Commonly known as:  EPIPEN 2-PAK USE AS NEEDED   Fish Oil 1000 MG Caps Take 1 capsule by mouth 2 (two) times daily.   gabapentin 300 MG capsule Commonly known as:  NEURONTIN TAKE 1 TO 2 CAPSULES BY MOUTH AT BEDTIME (MUST SEE DR. FOR FUTURE REFILLS)   HYDROcodone-acetaminophen 5-325 MG tablet Commonly known as:  NORCO/VICODIN Take 0.5-1 tablets by mouth 2 (two) times daily as needed (No more than 1-2x a week use.).   Insulin Pen Needle 29G X 62.2WL Misc 1 application by Does not apply route daily.   MAGNESIUM PO Take 1 capsule by mouth daily.   NEEDLE (DISP) 23 G 23G X 1-1/2" Misc Use as directed   NEEDLE (DISP) 23 G 23G X 1" Misc Inject once a week   NON FORMULARY Beufomax   potassium chloride 10 MEQ tablet Commonly known as:  K-DUR,KLOR-CON Take 1 tablet (10 mEq total) by mouth 3 (three) times daily.   SAW PALMETTO PO Take 1 capsule by mouth 2 (two) times daily.   SYRINGE-NEEDLE (DISP) 3 ML 22G X 1-1/2" 3 ML Misc Use as directed.   testosterone cypionate 100 MG/ML injection Commonly known as:  DEPO-TESTOSTERONE Inject 0.4 mLs (40 mg total) into the muscle once a week. For IM use only   Vitamin D 50 MCG (2000 UT) Caps Take 1 capsule by mouth daily.   Zinc 15 MG Caps Take by mouth.       Allergies:  Allergies  Allergen Reactions  . Bee Venom Anaphylaxis  . Flagyl [Metronidazole Hcl] Other (See Comments)    Neuropathy   . Nsaids     REACTION: intolereance    Past Medical History:  Diagnosis Date  . Allergic rhinitis, cause unspecified   . Anxiety   . Arthritis    OA  . Cancer ([place])    lung tumor   . 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 Cataract    forming  . Depressive disorder, not elsewhere classified   . Esophageal reflux    past hx   . HTN  (hypertension)   . Lymphocytic colitis   . Neuromuscular disorder ([facility])    neuropathy  . Neuropathy   . Other testicular hypofunction   . Palpitations   . Personal history of urinary calculi     Past Surgical History:  Procedure Laterality Date  . COLONOSCOPY    . 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 parotid tumor surgery    . TONSILLECTOMY AND  ADENOIDECTOMY      Family History  Problem Relation Age of Onset  . Hypertension Mother   . Anxiety disorder Mother   . Non-Hodgkin's lymphoma Mother   . Dementia Father   . Depression Father        and anxiety  . Hypertension Father   . Sudden death Paternal Uncle 81  . Colon cancer Paternal Uncle   . Hypertension Brother   . Early death Neg Hx   . Heart disease Neg Hx        mother in 64s had lesoin  . Colon polyps Neg Hx   . Esophageal cancer Neg Hx   . Rectal cancer Neg Hx   . Stomach cancer Neg Hx     Social History:  reports that he has never smoked. He has never used smokeless tobacco. He reports current alcohol use. He reports that he does not use drugs.  Review of Systems  IMPAIRED fasting glucose:  Although his A1c is normal usually his fasting glucose is consistently high  Fasting glucose glucose tolerance test done on 12/27 was 105 but 2-hour reading was only 103 May not have been as consistent with diet recently with a slight increase in weight  Wt Readings from Last 3 Encounters:  04/06/18 180 lb 6.4 oz (81.8 kg)  03/15/18 178 lb (80.7 kg)  03/08/18 175 lb (79.4 kg)   Lab Results  Component Value Date   GLUCOSE 110 (H) 03/31/2018   GLUCOSE 121 (H) 03/09/2018   GLUCOSE 120 (H) 12/27/2017   GLUCOSE 120 (H) 12/27/2017   Lab Results  Component Value Date   HGBA1C 5.4 12/27/2017   HGBA1C 5.6 04/07/2017   HGBA1C 5.5 12/28/2016   Lab Results  Component Value Date   LDLCALC 83 12/27/2017   CREATININE 1.14 03/31/2018    General Examination:   BP  122/64 (BP Location: Left Arm, Patient Position: Sitting, Cuff Size: Normal)   Pulse 69   Ht 5\' 8"  (1.727 m)   Wt 180 lb 6.4 oz (81.8 kg)   SpO2 95%   BMI 27.43 kg/m   He has about 3 erythematous slightly raised lesions on his right forehead without vesiculation   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  Also may have component of secondary hypogonadism with metabolic syndrome  He is taking Depo-Testosterone once a week By mistake he is taking only half the dose prescribed because of using the lower concentration of the testosterone injection Even with doing this about 3 times his testosterone level is still in the upper 400 range Again not clear if his fatigue is related to hypogonadism or other factors  Previously having erythrocytosis from the injections and the hemoglobin is now in the normal range   IMPAIRED fasting glucose with normal A1c Glucose tolerance is normal with 2-hour reading of 103 Again encouraged him to be active and exercising with keeping his weight down with calorie control   Plan: He will try 30 mg testosterone weekly with 0.3 mm Recheck testosterone level in 1 month He may follow-up with PCP for other questions   Elayne Snare 04/06/2018, 9:20 AM     Note: This office note was prepared with Dragon voice recognition system technology. Any transcriptional errors that result from this process are unintentional.

## 2018-04-06 ENCOUNTER — Encounter: Payer: Self-pay | Admitting: Endocrinology

## 2018-04-06 ENCOUNTER — Ambulatory Visit: Payer: 59 | Admitting: Endocrinology

## 2018-04-06 ENCOUNTER — Ambulatory Visit: Payer: 59 | Admitting: Family Medicine

## 2018-04-06 ENCOUNTER — Encounter: Payer: Self-pay | Admitting: Family Medicine

## 2018-04-06 VITALS — BP 122/64 | HR 69 | Ht 68.0 in | Wt 180.4 lb

## 2018-04-06 VITALS — BP 138/78 | HR 70 | Temp 97.9°F | Ht 68.0 in | Wt 181.6 lb

## 2018-04-06 DIAGNOSIS — E876 Hypokalemia: Secondary | ICD-10-CM | POA: Diagnosis not present

## 2018-04-06 DIAGNOSIS — E291 Testicular hypofunction: Secondary | ICD-10-CM | POA: Diagnosis not present

## 2018-04-06 DIAGNOSIS — B029 Zoster without complications: Secondary | ICD-10-CM | POA: Diagnosis not present

## 2018-04-06 DIAGNOSIS — B0230 Zoster ocular disease, unspecified: Secondary | ICD-10-CM

## 2018-04-06 DIAGNOSIS — R7301 Impaired fasting glucose: Secondary | ICD-10-CM

## 2018-04-06 MED ORDER — VALACYCLOVIR HCL 1 G PO TABS: 1000.0000 mg | ORAL_TABLET | Freq: Three times a day (TID) | ORAL | 0 refills | Status: DC

## 2018-04-06 NOTE — Patient Instructions (Signed)
0.35ml

## 2018-04-06 NOTE — Patient Instructions (Addendum)
You have an appointment scheduled for today at [facility] today at 4:00. Their office telephone number is [phone]. Their address is [address], [place], [place] [address]  Start valtrex immediately.    Shingles  Shingles, which is also known as herpes zoster, is an infection that causes a painful skin rash and fluid-filled blisters. It is caused by a virus. Shingles only develops in people who:  Have had chickenpox.  Have been given a medicine to protect against chickenpox (have been vaccinated). Shingles is rare in this group. What are the causes? Shingles is caused by varicella-zoster virus (VZV). This is the same virus that causes chickenpox. After a person is exposed to VZV, the virus stays in the body in an inactive (dormant) state. Shingles develops if the virus is reactivated. This can happen many years after the first (initial) exposure to VZV. It is not known what causes this virus to be reactivated. What increases the risk? People who have had chickenpox or received the chickenpox vaccine are at risk for shingles. Shingles infection is more common in people who:  Are older than age 26.  Have a weakened disease-fighting system (immune system), such as people with: ? HIV. ? AIDS. ? Cancer.  Are taking medicines that weaken the immune system, such as transplant medicines.  Are experiencing a lot of stress. What are the signs or symptoms? Early symptoms of this condition include itching, tingling, and pain in an area on your skin. Pain may be described as burning, stabbing, or throbbing. A few days or weeks after early symptoms start, a painful red rash appears. The rash is usually on one side of the body and has a band-like or belt-like pattern. The rash eventually turns into fluid-filled blisters that break open, change into scabs, and dry up in about 2-3 weeks. At any time during the infection, you may also develop:  A fever.  Chills.  A headache.  An  upset stomach. How is this diagnosed? This condition is diagnosed with a skin exam. Skin or fluid samples may be taken from the blisters before a diagnosis is made. These samples are examined under a microscope or sent to a lab for testing. How is this treated? The rash may last for several weeks. There is not a specific cure for this condition. Your health care provider will probably prescribe medicines to help you manage pain, recover more quickly, and avoid long-term problems. Medicines may include:  Antiviral drugs.  Anti-inflammatory drugs.  Pain medicines.  Anti-itching medicines (antihistamines). If the area involved is on your face, you may be referred to a specialist, such as an eye doctor (ophthalmologist) or an ear, nose, and throat (ENT) doctor (otolaryngologist) to help you avoid eye problems, chronic pain, or disability. Follow these instructions at home: Medicines  Take over-the-counter and prescription medicines only as told by your health care provider.  Apply an anti-itch cream or numbing cream to the affected area as told by your health care provider. Relieving itching and discomfort   Apply cold, wet cloths (cold compresses) to the area of the rash or blisters as told by your health care provider.  Cool baths can be soothing. Try adding baking soda or dry oatmeal to the water to reduce itching. Do not bathe in hot water. Blister and rash care  Keep your rash covered with a loose bandage (dressing). Wear loose-fitting clothing to help ease the pain of material rubbing against the rash.  Keep your rash and blisters clean  by washing the area with mild soap and cool water as told by your health care provider.  Check your rash every day for signs of infection. Check for: ? More redness, swelling, or pain. ? Fluid or blood. ? Warmth. ? Pus or a bad smell.  Do not scratch your rash or pick at your blisters. To help avoid scratching: ? Keep your fingernails clean  and cut short. ? Wear gloves or mittens while you sleep, if scratching is a problem. General instructions  Rest as told by your health care provider.  Keep all follow-up visits as told by your health care provider. This is important.  Wash your hands often with soap and water. If soap and water are not available, use hand sanitizer. Doing this lowers your chance of getting a bacterial skin infection.  Before your blisters change into scabs, your shingles infection can cause chickenpox in people who have never had it or have never been vaccinated against it. To prevent this from happening, avoid contact with other people, especially: ? Babies. ? Pregnant women. ? Children who have eczema. ? Elderly people who have transplants. ? People who have chronic illnesses, such as cancer or AIDS. Contact a health care provider if:  Your pain is not relieved with prescribed medicines.  Your pain does not get better after the rash heals.  You have signs of infection in the rash area, such as: ? More redness, swelling, or pain around the rash. ? Fluid or blood coming from the rash. ? The rash area feeling warm to the touch. ? Pus or a bad smell coming from the rash. Get help right away if:  The rash is on your face or nose.  You have facial pain, pain around your eye area, or loss of feeling on one side of your face.  You have difficulty seeing.  You have ear pain or have ringing in your ear.  You have a loss of taste.  Your condition gets worse. Summary  Shingles, which is also known as herpes zoster, is an infection that causes a painful skin rash and fluid-filled blisters.  This condition is diagnosed with a skin exam. Skin or fluid samples may be taken from the blisters and examined before the diagnosis is made.  Keep your rash covered with a loose bandage (dressing). Wear loose-fitting clothing to help ease the pain of material rubbing against the rash.  Before your blisters  change into scabs, your shingles infection can cause chickenpox in people who have never had it or have never been vaccinated against it. This information is not intended to replace advice given to you by your health care provider. Make sure you discuss any questions you have with your health care provider. Document Released: 03/22/2005 Document Revised: 11/24/2016 Document Reviewed: 11/24/2016 Elsevier Interactive Patient Education  2019 [employer].

## 2018-04-06 NOTE — Progress Notes (Signed)
Subjective:  [name] Page is a 68 y.o. year old very pleasant male patient who presents for/with See problem oriented charting ROS-felt some chills and fatigue yesterday.  Notes rash on right forehead and onto right eyelid.  No blurry vision or double vision.  Past Medical History-  Patient Active Problem List   Diagnosis Date Noted  . Chronic pain syndrome 08/31/2016    Priority: High  . History of lung cancer 12/11/2012    Priority: High  . History of adenomatous polyp of colon 03/24/2018    Priority: Medium  . Generalized anxiety disorder 03/12/2014    Priority: Medium  . Essential hypertension 03/12/2014    Priority: Medium  . Hereditary and idiopathic peripheral neuropathy 12/22/2012    Priority: Medium  . Dyspnea 07/14/2012    Priority: Medium  . Hypertriglyceridemia 07/14/2012    Priority: Medium  . Depression 02/16/2010    Priority: Medium  . Low testosterone 11/21/2007    Priority: Medium  . Eustachian tube dysfunction 12/17/2014    Priority: Low  . Nonspecific abnormal electrocardiogram (ECG) (EKG) 07/14/2012    Priority: Low  . Allergic reaction, history of 10/25/2011    Priority: Low  . Rectal or anal pain 10/01/2010    Priority: Low  . Allergic rhinitis 06/13/2007    Priority: Low  . GERD 06/13/2007    Priority: Low  . IBS (irritable bowel syndrome) 06/13/2007    Priority: Low  . Palpitations 08/11/2016  . Peroneal tendonitis of right lower extremity 05/08/2014  . SI (sacroiliac) joint dysfunction 09/19/2013  . Nonallopathic lesion of sacral region 09/19/2013  . Nonallopathic lesion of thoracic region 09/19/2013  . Nonallopathic lesion of lumbosacral region 09/19/2013  . Right foot pain 12/22/2012    Medications- reviewed and updated Current Outpatient Medications  Medication Sig Dispense Refill  . Acetylcarnitine HCl (ACETYL L-CARNITINE PO) Take by mouth.    . ACETYLCYSTEINE PO Take by mouth.    . ALPHA-LIPOIC ACID PO Take by mouth.    Marland Kitchen  aspirin EC 81 MG tablet Take 81 mg by mouth daily.    Marland Kitchen b complex vitamins tablet Take 1 tablet by mouth daily.      Marland Kitchen buPROPion (WELLBUTRIN XL) 300 MG 24 hr tablet TAKE 1 TABLET BY MOUTH EVERY DAY 90 tablet 3  . chlorthalidone (HYGROTON) 25 MG tablet TAKE 1/2-1 TABLETS (12.5-25 MG TOTAL) BY MOUTH DAILY. 30 tablet 5  . Cholecalciferol (VITAMIN D) 2000 UNITS CAPS Take 1 capsule by mouth daily.     . clonazePAM (KLONOPIN) 0.5 MG tablet TAKE 1 TABLET BY MOUTH TWICE DAILY AS NEEDED FOR ANXIETY 60 tablet 5  . Coenzyme Q10 (CO Q 10 PO) Take 1 capsule by mouth daily.    [name], [name], (DAMIANA LEAF PO) Take 700 mg by mouth daily.    Marland Kitchen diltiazem (CARDIZEM CD) 180 MG 24 hr capsule TAKE 1 CAPSULE BY MOUTH ONCE DAILY 90 capsule 3  . EPINEPHrine (EPIPEN 2-PAK) 0.3 mg/0.3 mL IJ SOAJ injection USE AS NEEDED 2 Device 1  . gabapentin (NEURONTIN) 300 MG capsule TAKE 1 TO 2 CAPSULES BY MOUTH AT BEDTIME (MUST SEE DR. FOR FUTURE REFILLS) 180 capsule 0  . HYDROcodone-acetaminophen (NORCO/VICODIN) 5-325 MG tablet Take 0.5-1 tablets by mouth 2 (two) times daily as needed (No more than 1-2x a week use.). 52 tablet 0  . Insulin Pen Needle 29G X 81.1BJ MISC 1 application by Does not apply route daily. 100 each 3  . MAGNESIUM PO Take 1 capsule  by mouth daily.    Marland Kitchen NEEDLE, DISP, 23 G 23G X 1" MISC Inject once a week 25 each 0  . NEEDLE, DISP, 23 G 23G X 1-1/2" MISC Use as directed 100 each 3  . NON FORMULARY Beufomax    . Omega-3 Fatty Acids (FISH OIL) 1000 MG CAPS Take 1 capsule by mouth 2 (two) times daily.     . potassium chloride (K-DUR,KLOR-CON) 10 MEQ tablet Take 1 tablet (10 mEq total) by mouth 3 (three) times daily. 90 tablet 5  . Saw Palmetto, Serenoa repens, (SAW PALMETTO PO) Take 1 capsule by mouth 2 (two) times daily.    . SYRINGE-NEEDLE, DISP, 3 ML 22G X 1-1/2" 3 ML MISC Use as directed. 100 each 3  . testosterone cypionate (DEPO-TESTOSTERONE) 100 MG/ML injection Inject 0.4 mLs (40 mg total) into  the muscle once a week. For IM use only (Patient taking differently: Inject 20 mg into the muscle once a week. For IM use only) 10 mL 5  . Zinc 15 MG CAPS Take by mouth.    . valACYclovir (VALTREX) 1000 MG tablet Take 1 tablet (1,000 mg total) by mouth 3 (three) times daily. For 7 days for shingles. 21 tablet 0   No current facility-administered medications for this visit.    Facility-Administered Medications Ordered in Other Visits  Medication Dose Route Frequency Provider Last Rate Last Dose  . gadopentetate dimeglumine (MAGNEVIST) injection 20 mL  20 mL Intravenous Once PRN Melvenia Beam, MD        Objective: BP 138/78   Pulse 70   Temp 97.9 F (36.6 C) (Oral)   Ht 5\' 8"  (1.727 m)   Wt 181 lb 9.6 oz (82.4 kg)   SpO2 97%   BMI 27.61 kg/m  Gen: NAD, resting comfortably CV: RRR no murmurs rubs or gallops Lungs: CTAB no crackles, wheeze, rhonchi Ext: no edema Skin: warm, dry, over right forehead multiple erythematous patches that do not cross midline-extends onto right eyelid. Neuro: Grossly normal vision, normal speech  Assessment/Plan:   Herpes zoster ophthalmicus of right eye - Plan: Ambulatory referral to Ophthalmology  S: Symptoms started 2 days ago. Started with gritty feeling in eye and then later developed a rash over forehead. Mild to moderate pain and dull headache sensation behnid eye.  Still has gritty feeling in eye but no vision loss. Stressful over holidays.  Pain is not worsening but rash seems to be worsening. A/P: Concern for herpes zoster ophthalmicus-start Valtrex 1000 mg 3 times daily immediately.  We were able to get him in today at [facility] ophthalmology for 4 PM appointment.  Hypokalemia - Plan: Basic metabolic panel, Magnesium Low potassium on diuretic in the past- patient wants to add magnesium to next labs which I think is very reasonable  Future Appointments  Date Time Provider [place]  08/14/2018  9:15 AM [facility]  None  08/16/2018  9:00 AM Elayne Snare, MD [facility] None   Lab/Order associations: Herpes zoster ophthalmicus of right eye - Plan: Ambulatory referral to Ophthalmology  Hypokalemia - Plan: Basic metabolic panel, Magnesium  Meds ordered this encounter  Medications  . valACYclovir (VALTREX) 1000 MG tablet    Sig: Take 1 tablet (1,000 mg total) by mouth 3 (three) times daily. For 7 days for shingles.    Dispense:  21 tablet    Refill:  0    Return precautions advised.  [name], MD

## 2018-04-10 ENCOUNTER — Encounter: Payer: Self-pay | Admitting: Family Medicine

## 2018-04-13 DIAGNOSIS — B029 Zoster without complications: Secondary | ICD-10-CM | POA: Diagnosis not present

## 2018-04-20 ENCOUNTER — Other Ambulatory Visit (INDEPENDENT_AMBULATORY_CARE_PROVIDER_SITE_OTHER): Payer: 59

## 2018-04-20 DIAGNOSIS — E876 Hypokalemia: Secondary | ICD-10-CM

## 2018-04-20 LAB — BASIC METABOLIC PANEL
BUN: 17 mg/dL (ref 6–23)
CO2: 35 mEq/L — ABNORMAL HIGH (ref 19–32)
Calcium: 10.1 mg/dL (ref 8.4–10.5)
Chloride: 97 mEq/L (ref 96–112)
Creatinine, Ser: 1.17 mg/dL (ref 0.40–1.50)
GFR: 65.91 mL/min (ref >60.00)
GLUCOSE: 110 mg/dL — AB (ref 70–99)
Potassium: 3.8 mEq/L (ref 3.5–5.1)
SODIUM: 140 meq/L (ref 135–145)

## 2018-04-20 LAB — MAGNESIUM: Magnesium: 2.2 mg/dL (ref 1.5–2.5)

## 2018-04-21 MED FILL — POTASSIUM CHL ER M10 TABLET: 10 | 30 days supply | Qty: 90 | Fill #1

## 2018-05-02 MED FILL — CHLORTHALIDONE 25 MG TABS: 25 | 30 days supply | Qty: 30 | Fill #4

## 2018-05-02 MED FILL — GABAPENTIN 300 MG CAPSULE: 300 | 90 days supply | Qty: 180 | Fill #0

## 2018-05-03 ENCOUNTER — Other Ambulatory Visit: Payer: Self-pay | Admitting: Family Medicine

## 2018-05-03 MED FILL — clonazePAM 0.5 MG TABS: 0.5 | 30 days supply | Qty: 60 | Fill #2

## 2018-05-03 NOTE — Telephone Encounter (Signed)
Yes thanks 

## 2018-05-19 ENCOUNTER — Encounter: Payer: Self-pay | Admitting: Family Medicine

## 2018-05-22 MED FILL — buPROPion HCL ER (XL) 300 M: 300 | 90 days supply | Qty: 90 | Fill #3

## 2018-05-22 MED FILL — POTASSIUM CHL ER M10 TABLET: 10 | 30 days supply | Qty: 90 | Fill #2

## 2018-05-23 ENCOUNTER — Ambulatory Visit (INDEPENDENT_AMBULATORY_CARE_PROVIDER_SITE_OTHER): Payer: 59

## 2018-05-23 ENCOUNTER — Encounter: Payer: Self-pay | Admitting: Family Medicine

## 2018-05-23 ENCOUNTER — Ambulatory Visit: Payer: 59 | Admitting: Family Medicine

## 2018-05-23 VITALS — BP 132/84 | HR 65 | Temp 97.6°F | Ht 68.0 in | Wt 180.2 lb

## 2018-05-23 DIAGNOSIS — G894 Chronic pain syndrome: Secondary | ICD-10-CM

## 2018-05-23 DIAGNOSIS — Z79899 Other long term (current) drug therapy: Secondary | ICD-10-CM | POA: Diagnosis not present

## 2018-05-23 DIAGNOSIS — R05 Cough: Secondary | ICD-10-CM

## 2018-05-23 DIAGNOSIS — F3341 Major depressive disorder, recurrent, in partial remission: Secondary | ICD-10-CM | POA: Diagnosis not present

## 2018-05-23 DIAGNOSIS — Z85118 Personal history of other malignant neoplasm of bronchus and lung: Secondary | ICD-10-CM | POA: Diagnosis not present

## 2018-05-23 DIAGNOSIS — G609 Hereditary and idiopathic neuropathy, unspecified: Secondary | ICD-10-CM

## 2018-05-23 DIAGNOSIS — E781 Pure hyperglyceridemia: Secondary | ICD-10-CM | POA: Diagnosis not present

## 2018-05-23 DIAGNOSIS — R059 Cough, unspecified: Secondary | ICD-10-CM

## 2018-05-23 DIAGNOSIS — R918 Other nonspecific abnormal finding of lung field: Secondary | ICD-10-CM | POA: Diagnosis not present

## 2018-05-23 MED ORDER — ATORVASTATIN CALCIUM 10 MG PO TABS: 10.0000 mg | ORAL_TABLET | Freq: Every day | ORAL | 3 refills | Status: DC

## 2018-05-23 MED ORDER — HYDROCODONE-ACETAMINOPHEN 5-325 MG PO TABS: 0.5000 | ORAL_TABLET | Freq: Two times a day (BID) | ORAL | 0 refills | Status: DC | PRN

## 2018-05-23 MED FILL — HYDROCODON-APAP 5-325: 5-325 | 84 days supply | Qty: 55 | Fill #0

## 2018-05-23 MED FILL — ATORVASTATIN 10 MG TABLET: 10 | 90 days supply | Qty: 90 | Fill #0

## 2018-05-23 NOTE — Patient Instructions (Addendum)
Please stop by lab before you go- they also need to do a chest x-ray while you are there I expect a normal result on your urine drug screen-we will not reach out if normal.  Sent in atorvastatin 10mg .   Refilled #[id number] of the vicodin

## 2018-05-23 NOTE — Assessment & Plan Note (Signed)
S: well controlled on no statin. microvascular ischemia- on brain MRI- he prefers to target LDL under 70 Lab Results  Component Value Date   CHOL 144 12/27/2017   HDL 47.10 12/27/2017   LDLCALC 83 12/27/2017   TRIG 67.0 12/27/2017   CHOLHDL 3 12/27/2017   A/P: Trial atorvastatin 10 mg daily- if has myalgias could trial and so we can titrate up.  Suspect will improve already reasonably well-controlled numbers-we will target LDL under 70 instead

## 2018-05-23 NOTE — Addendum Note (Signed)
Addended by: Francis Dowse T on: 05/23/2018 09:37 AM   Modules accepted: Orders

## 2018-05-23 NOTE — Progress Notes (Signed)
Phone [phone]   Subjective:  [name] Page is a 68 y.o. year old very pleasant male patient who presents for/with See problem oriented charting ROS- continued neuropathic pain.  Mild productive cough.  No shortness of breath or chest pain.  No edema.  Past Medical History-  Patient Active Problem List   Diagnosis Date Noted  . Chronic pain syndrome 08/31/2016    Priority: High  . History of lung cancer 12/11/2012    Priority: High  . History of adenomatous polyp of colon 03/24/2018    Priority: Medium  . Generalized anxiety disorder 03/12/2014    Priority: Medium  . Essential hypertension 03/12/2014    Priority: Medium  . Hereditary and idiopathic peripheral neuropathy 12/22/2012    Priority: Medium  . Dyspnea 07/14/2012    Priority: Medium  . Hypertriglyceridemia 07/14/2012    Priority: Medium  . Depression 02/16/2010    Priority: Medium  . Low testosterone 11/21/2007    Priority: Medium  . Eustachian tube dysfunction 12/17/2014    Priority: Low  . Nonspecific abnormal electrocardiogram (ECG) (EKG) 07/14/2012    Priority: Low  . Allergic reaction, history of 10/25/2011    Priority: Low  . Rectal or anal pain 10/01/2010    Priority: Low  . Allergic rhinitis 06/13/2007    Priority: Low  . GERD 06/13/2007    Priority: Low  . IBS (irritable bowel syndrome) 06/13/2007    Priority: Low  . Palpitations 08/11/2016  . Peroneal tendonitis of right lower extremity 05/08/2014  . SI (sacroiliac) joint dysfunction 09/19/2013  . Nonallopathic lesion of sacral region 09/19/2013  . Nonallopathic lesion of thoracic region 09/19/2013  . Nonallopathic lesion of lumbosacral region 09/19/2013  . Right foot pain 12/22/2012    Medications- reviewed and updated Current Outpatient Medications  Medication Sig Dispense Refill  . Acetylcarnitine HCl (ACETYL L-CARNITINE PO) Take by mouth.    . ACETYLCYSTEINE PO Take by mouth.    . ALPHA-LIPOIC ACID PO Take by mouth.    Marland Kitchen  aspirin EC 81 MG tablet Take 81 mg by mouth daily.    Marland Kitchen b complex vitamins tablet Take 1 tablet by mouth daily.      Marland Kitchen buPROPion (WELLBUTRIN XL) 300 MG 24 hr tablet TAKE 1 TABLET BY MOUTH EVERY DAY 90 tablet 3  . chlorthalidone (HYGROTON) 25 MG tablet TAKE 1/2-1 TABLETS (12.5-25 MG TOTAL) BY MOUTH DAILY. 30 tablet 5  . Cholecalciferol (VITAMIN D) 2000 UNITS CAPS Take 1 capsule by mouth daily.     . clonazePAM (KLONOPIN) 0.5 MG tablet TAKE 1 TABLET BY MOUTH TWICE DAILY AS NEEDED FOR ANXIETY 60 tablet 5  . Coenzyme Q10 (CO Q 10 PO) Take 1 capsule by mouth daily.    [name], [name], (DAMIANA LEAF PO) Take 700 mg by mouth daily.    Marland Kitchen diltiazem (CARDIZEM CD) 180 MG 24 hr capsule TAKE 1 CAPSULE BY MOUTH ONCE DAILY 90 capsule 3  . EPINEPHrine (EPIPEN 2-PAK) 0.3 mg/0.3 mL IJ SOAJ injection USE AS NEEDED 2 Device 1  . gabapentin (NEURONTIN) 300 MG capsule TAKE 1 TO 2 CAPSULES BY MOUTH AT BEDTIME (MUST SEE DR. FOR FUTURE REFILLS) 180 capsule 0  . HYDROcodone-acetaminophen (NORCO/VICODIN) 5-325 MG tablet Take 0.5-1 tablets by mouth 2 (two) times daily as needed (No more than 1-2x a week use.). 55 tablet 0  . Insulin Pen Needle 29G X 86.5HQ MISC 1 application by Does not apply route daily. 100 each 3  . MAGNESIUM PO Take 1 capsule  by mouth daily.    Marland Kitchen NEEDLE, DISP, 23 G 23G X 1" MISC Inject once a week 25 each 0  . NEEDLE, DISP, 23 G 23G X 1-1/2" MISC Use as directed 100 each 3  . NON FORMULARY Beufomax    . Omega-3 Fatty Acids (FISH OIL) 1000 MG CAPS Take 1 capsule by mouth 2 (two) times daily.     . potassium chloride (K-DUR,KLOR-CON) 10 MEQ tablet Take 1 tablet (10 mEq total) by mouth 3 (three) times daily. 90 tablet 5  . Saw Palmetto, Serenoa repens, (SAW PALMETTO PO) Take 1 capsule by mouth 2 (two) times daily.    . SYRINGE-NEEDLE, DISP, 3 ML 22G X 1-1/2" 3 ML MISC Use as directed. 100 each 3  . testosterone cypionate (DEPO-TESTOSTERONE) 100 MG/ML injection Inject 0.4 mLs (40 mg total) into  the muscle once a week. For IM use only (Patient taking differently: Inject 20 mg into the muscle once a week. For IM use only) 10 mL 5  . Zinc 15 MG CAPS Take by mouth.    Marland Kitchen atorvastatin (LIPITOR) 10 MG tablet Take 1 tablet (10 mg total) by mouth daily. 90 tablet 3   No current facility-administered medications for this visit.      Objective:  BP 132/84 (BP Location: Left Arm, Cuff Size: Normal)   Pulse 65   Temp 97.6 F (36.4 C) (Oral)   Ht 5\' 8"  (1.727 m)   Wt 180 lb 3.2 oz (81.7 kg)   SpO2 96%   BMI 27.40 kg/m  Gen: NAD, resting comfortably CV: RRR no murmurs rubs or gallops Lungs: CTAB no crackles, wheeze, rhonchi Ext: no edema Skin: warm, dry    Assessment and Plan   Mildly productive cough in patient with history lung cancer/carcinoid S: patient with dry cough 3 weeks in September- this resolve.d we did x-ray at that time with only scarring at right lung base. He has had recurrence of cough over last 3 months- now mildly productive. No shortness of breath. No improvement.  A/P: prior dry cough in sept resolved. now mildly productive cough for 3 months. otherwise asymptomatic. considering CT scan if symptoms persist through next visit  Chronic pain syndrome Neuropathy  s: Patient treated for herpes zoster ophthalmicus of right eye in early January.  He was treated with  Valtrex at that time.  He had severe pain with this and took some extra of his hydrocodone which he normally uses forchronic pain from his neuropathy.  Previously was using hydrocodone 1 to 3 tablets/week (slightly worse recently. He has run out of medication- luckily not using for the eyes anymore A/P: largely stable/mild worsening- will refill with slight increase to #55 from #52 -[place] reviewed today- patient receiving clonazepam and hydrocodone through me.  Testosterone through Dr. Dwyane Dee. - Reviewed risk of combination of clonazepam and hydrocodone-with separate these doses by 8 hours of takes hydrocodone  first or 12 hours of takes clonazepam first -Controlled substance contract 08/31/2016 -Last UDS 12/28/2016-repeat today.   Depression S: PHQ 9 of 10 last visit- states improvement at this visit reflected by improvement to 8 on score.  Fatigue/work schedule planned to this as well as continued pain from neuropathy.  Neurology had recommended adding nortriptyline but he did not want to add this as he was concerned about increasing palpitations- still declines today. Remains on Wellbutrin.  He declined change in medication last visit or adding therapy-wanted to get through a tough stretch at work and then reevaluate. Did not  get great benefit from counseling in the past so declines repeat  Also using clonazepam 0.5 mg essentially twice a day.  He had bigeminy on Viibryd in the past and was taken off.  Discussed if takes hydrocodone needs to hold next dose of clonazepam A/P:  Largely stable- mild poor control- he declines change in therapy for now.     Hypertriglyceridemia S: well controlled on no statin. microvascular ischemia- on brain MRI- he prefers to target LDL under 70 Lab Results  Component Value Date   CHOL 144 12/27/2017   HDL 47.10 12/27/2017   LDLCALC 83 12/27/2017   TRIG 67.0 12/27/2017   CHOLHDL 3 12/27/2017   A/P: Trial atorvastatin 10 mg daily- if has myalgias could trial and so we can titrate up.  Suspect will improve already reasonably well-controlled numbers-we will target LDL under 70 instead   Future Appointments  Date Time Provider [place]  08/14/2018  9:15 AM [facility] None  08/16/2018  9:00 AM Elayne Snare, MD [facility] None   No follow-ups on file.  Lab/Order associations: High risk medication use - Plan: Pain Mgmt, Profile 8 w/Conf, U  Chronic pain syndrome - Plan: Pain Mgmt, Profile 8 w/Conf, U  History of lung cancer - Plan: DG Chest 2 View  Cough - Plan: DG Chest 2 View  Hereditary and idiopathic peripheral  neuropathy  Recurrent major depressive disorder, in partial remission (HCC)  Hypertriglyceridemia  Meds ordered this encounter  Medications  . HYDROcodone-acetaminophen (NORCO/VICODIN) 5-325 MG tablet    Sig: Take 0.5-1 tablets by mouth 2 (two) times daily as needed (No more than 1-2x a week use.).    Dispense:  55 tablet    Refill:  0  . atorvastatin (LIPITOR) 10 MG tablet    Sig: Take 1 tablet (10 mg total) by mouth daily.    Dispense:  90 tablet    Refill:  3    Return precautions advised.  [name], MD

## 2018-05-23 NOTE — Assessment & Plan Note (Signed)
S: PHQ 9 of 10 last visit- states improvement at this visit reflected by improvement to 8 on score.  Fatigue/work schedule planned to this as well as continued pain from neuropathy.  Neurology had recommended adding nortriptyline but he did not want to add this as he was concerned about increasing palpitations- still declines today. Remains on Wellbutrin.  He declined change in medication last visit or adding therapy-wanted to get through a tough stretch at work and then reevaluate. Did not get great benefit from counseling in the past so declines repeat  Also using clonazepam 0.5 mg essentially twice a day.  He had bigeminy on Viibryd in the past and was taken off.  Discussed if takes hydrocodone needs to hold next dose of clonazepam A/P:  Largely stable- mild poor control- he declines change in therapy for now.

## 2018-05-23 NOTE — Assessment & Plan Note (Signed)
Neuropathy  s: Patient treated for herpes zoster ophthalmicus of right eye in early January.  He was treated with  Valtrex at that time.  He had severe pain with this and took some extra of his hydrocodone which he normally uses forchronic pain from his neuropathy.  Previously was using hydrocodone 1 to 3 tablets/week (slightly worse recently. He has run out of medication- luckily not using for the eyes anymore A/P: largely stable/mild worsening- will refill with slight increase to #55 from #52 -[name] Page reviewed today- patient receiving clonazepam and hydrocodone through me.  Testosterone through [name]. - Reviewed risk of combination of clonazepam and hydrocodone-with separate these doses by 8 hours of takes hydrocodone first or 12 hours of takes clonazepam first -Controlled substance contract 08/31/2016 -Last UDS 12/28/2016-repeat today.

## 2018-05-27 LAB — PAIN MGMT, PROFILE 8 W/CONF, U
6 Acetylmorphine: NEGATIVE ng/mL (ref <10)
Alcohol Metabolites: NEGATIVE ng/mL (ref <500)
Amphetamines: NEGATIVE ng/mL (ref <500)
Benzodiazepines: NEGATIVE ng/mL (ref <100)
Buprenorphine, Urine: NEGATIVE ng/mL (ref <5)
COCAINE METABOLITE: NEGATIVE ng/mL (ref <150)
Codeine: NEGATIVE ng/mL (ref <50)
Creatinine: 96.4 mg/dL
Hydrocodone: NEGATIVE ng/mL (ref <50)
Hydromorphone: NEGATIVE ng/mL (ref <50)
MDMA: NEGATIVE ng/mL (ref <500)
Marijuana Metabolite: NEGATIVE ng/mL (ref <20)
Morphine: NEGATIVE ng/mL (ref <50)
Norhydrocodone: 52 ng/mL — ABNORMAL HIGH (ref <50)
Opiates: POSITIVE ng/mL — AB (ref <100)
Oxidant: NEGATIVE ug/mL (ref <200)
Oxycodone: NEGATIVE ng/mL (ref <100)
pH: 6.72 (ref 4.5–9.0)

## 2018-06-05 MED FILL — CHLORTHALIDONE 25 MG TABS: 25 | 30 days supply | Qty: 30 | Fill #5

## 2018-06-05 MED FILL — CARTIA XT 180 MG CAPSULE SA: 180 | 90 days supply | Qty: 90 | Fill #3

## 2018-06-05 MED FILL — clonazePAM 0.5 MG TABS: 0.5 | 30 days supply | Qty: 60 | Fill #3

## 2018-06-12 MED FILL — TESTOSTERONE CYP 200 MG/ML: 200 | 84 days supply | Qty: 3 | Fill #1

## 2018-06-19 MED FILL — POTASSIUM CHL ER M10 TABLET: 10 | 30 days supply | Qty: 90 | Fill #3

## 2018-06-27 ENCOUNTER — Other Ambulatory Visit: Payer: Self-pay | Admitting: Family Medicine

## 2018-06-29 ENCOUNTER — Other Ambulatory Visit: Payer: Self-pay | Admitting: Cardiology

## 2018-06-29 ENCOUNTER — Other Ambulatory Visit: Payer: Self-pay | Admitting: Family Medicine

## 2018-06-30 MED FILL — EPINEPHRINE 0.3 MG AUTO-INJ: 0.3 | 2 days supply | Qty: 2 | Fill #1

## 2018-06-30 MED FILL — CHLORTHALIDONE 25 MG TABS: 25 | 30 days supply | Qty: 30 | Fill #0

## 2018-06-30 MED FILL — clonazePAM 0.5 MG TABS: 0.5 | 30 days supply | Qty: 60 | Fill #4

## 2018-07-07 MED ORDER — GABAPENTIN 300 MG PO CAPS: 600.0000 mg | ORAL_CAPSULE | Freq: Every day | ORAL | 1 refills | Status: DC

## 2018-08-02 MED FILL — clonazePAM 0.5 MG TABS: 0.5 | 30 days supply | Qty: 60 | Fill #5

## 2018-08-02 MED FILL — CHLORTHALIDONE 25 MG TABLET: 25 | 30 days supply | Qty: 30 | Fill #1

## 2018-08-02 MED FILL — GABAPENTIN 300 MG CAPSULE: 300 | 90 days supply | Qty: 180 | Fill #0

## 2018-08-02 MED FILL — POTASSIUM CHL ER M10 TABLET: 10 | 30 days supply | Qty: 90 | Fill #4

## 2018-08-09 ENCOUNTER — Telehealth: Payer: Self-pay | Admitting: Family Medicine

## 2018-08-09 DIAGNOSIS — E876 Hypokalemia: Secondary | ICD-10-CM

## 2018-08-09 NOTE — Telephone Encounter (Signed)
I left patient a detailed message to call office back for clarification.

## 2018-08-09 NOTE — Telephone Encounter (Signed)
Patient is requesting lab orders for potassium. Please advise once orders are placed and contact patient to get scheduled for labs. Patient was just seen by Dr. Yong Channel in February 2020.   Copied from [place] [id number]. Topic: General - Other >> Aug 09, 2018 11:28 AM Antonieta Iba C wrote: Reason for CRM: pt called in because he has a lab order from Dr. Dwyane Dee (Endo) that he would like to have drawn in [place] office. Pt says that he would also like to have a complete panel ordered to test his potassium as well.   Pt would like assistance with this.   CB: [phone]

## 2018-08-09 NOTE — Telephone Encounter (Signed)
Can order bmet under hypokalemia  Not sure what patient meant by "complete panel" - can you please clarify with him? Looked like there was a cbc and testosterone placed as future orders by Dr. Dwyane Dee- is he referring to that? Does he want cmp instead of bmp/bmet? 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 Too early for full lipid panel but could do direct LDL if he wants.

## 2018-08-10 ENCOUNTER — Encounter: Payer: Self-pay | Admitting: Family Medicine

## 2018-08-10 NOTE — Telephone Encounter (Signed)
FYI   Edit from previous note- Pt wanted to check potassium and glucose. BMP will be added!

## 2018-08-10 NOTE — Telephone Encounter (Signed)
Patient has been schedeuled for Monday 08/14/2018. No further action needed!

## 2018-08-10 NOTE — Addendum Note (Signed)
Addended by: Gwenyth Ober R on: 08/10/2018 10:54 AM   Modules accepted: Orders

## 2018-08-10 NOTE — Telephone Encounter (Signed)
[place] for Monday- not only BMP we ordered but future labs Dr. Dwyane Dee had ordered- I believe cbc and testosterone

## 2018-08-10 NOTE — Telephone Encounter (Signed)
Spoke to pt and he stated that the he would like the cbc and testosterone that Dr. Dwyane Dee ordered as well as the BMP to check potassium.

## 2018-08-14 ENCOUNTER — Other Ambulatory Visit: Payer: Self-pay

## 2018-08-14 ENCOUNTER — Other Ambulatory Visit (INDEPENDENT_AMBULATORY_CARE_PROVIDER_SITE_OTHER): Payer: 59

## 2018-08-14 DIAGNOSIS — E291 Testicular hypofunction: Secondary | ICD-10-CM | POA: Diagnosis not present

## 2018-08-14 DIAGNOSIS — E876 Hypokalemia: Secondary | ICD-10-CM

## 2018-08-14 LAB — TESTOSTERONE: Testosterone: 561.52 ng/dL (ref 300.00–890.00)

## 2018-08-14 LAB — BASIC METABOLIC PANEL
BUN: 15 mg/dL (ref 6–23)
CO2: 34 mEq/L — ABNORMAL HIGH (ref 19–32)
Calcium: 9.3 mg/dL (ref 8.4–10.5)
Chloride: 97 mEq/L (ref 96–112)
Creatinine, Ser: 1.08 mg/dL (ref 0.40–1.50)
GFR: 67.94 mL/min (ref >60.00)
Glucose, Bld: 116 mg/dL — ABNORMAL HIGH (ref 70–99)
Potassium: 3.5 mEq/L (ref 3.5–5.1)
Sodium: 138 mEq/L (ref 135–145)

## 2018-08-14 LAB — CBC
HCT: 48.9 % (ref 39.0–52.0)
Hemoglobin: 17.4 g/dL — ABNORMAL HIGH (ref 13.0–17.0)
MCHC: 35.6 g/dL (ref 30.0–36.0)
MCV: 94.8 fl (ref 78.0–100.0)
Platelets: 235 10*3/uL (ref 150.0–400.0)
RBC: 5.16 Mil/uL (ref 4.22–5.81)
RDW: 11.7 % (ref 11.5–15.5)
WBC: 7.3 10*3/uL (ref 4.0–10.5)

## 2018-08-16 ENCOUNTER — Ambulatory Visit (INDEPENDENT_AMBULATORY_CARE_PROVIDER_SITE_OTHER): Payer: 59 | Admitting: Endocrinology

## 2018-08-16 ENCOUNTER — Encounter: Payer: Self-pay | Admitting: Endocrinology

## 2018-08-16 DIAGNOSIS — E291 Testicular hypofunction: Secondary | ICD-10-CM

## 2018-08-16 DIAGNOSIS — D751 Secondary polycythemia: Secondary | ICD-10-CM | POA: Diagnosis not present

## 2018-08-16 DIAGNOSIS — R7301 Impaired fasting glucose: Secondary | ICD-10-CM | POA: Diagnosis not present

## 2018-08-16 MED FILL — buPROPion HCL ER (XL) 300 M: 300 | 90 days supply | Qty: 90 | Fill #0

## 2018-08-16 NOTE — Progress Notes (Signed)
Patient ID: [name] Page, male   DOB: 04-22-1950, 68 y.o.   MRN: [id number]             Chief complaint: Endocrinology follow-up   Today's office visit was provided via telemedicine using video technique Explained to the patient and the the limitations of evaluation and management by telemedicine and the availability of in person appointments.  The patient understood the limitations and agreed to proceed. Patient also understood that the telehealth visit is billable. . Location of the patient: Home . Location of the provider: Office Only the patient and myself were participating in the encounter    History of Present Illness   HYPOGONADISM:  Hypogonadismwas diagnosed in ?  2012  He at that time had complaints offatigue, some increased depression, decreased libido although he does not remember symptoms well There is no history of the following: Hot flushes, sweats, breast enlargement, long term anabolic steroid use, history of testicular injury, head injury or mumps in childhood. No history of osteopenia or low impact fracture  At that time an afternoon testosterone level was 214 and free testosterone level low at 32, normal >47   He was also evaluated by a urologist and details are not available He was treated with testosterone gel but he thinks this did not improve his testosterone levels and likely did not continue treatment  Subsequently he was again evaluated in 2015 and with a low testosterone of 239 he was started on testosterone injections With this he thinks she had some improvement in his fatigue and depression His dose had been adjusted from his initial starting dose of 100 mg every 2 weeks and he has been on various regimens including every 3 or 4 weeks injections His testosterone level has been quite variable since about 2016  RECENT history:  He has been taking 40 mg of testosterone every week and takes this on Fridays  Because of his increased  hematocrit on his initial consultation he was changed to testosterone injections every week instead of every other week and the dose was reduced  Usually a tends to have mild persistent fatigue but his libido does not improve despite testosterone injections and normal testosterone levels  He was previously switched from 200 down to 100 mg/mL testosterone formulation because of requiring smaller doses but his pharmacy again has got him on the 200 mg/mL supply  On his last visit in 04/2018 his testosterone level was quite adequate although he was apparently only getting 20 mg with his injection for the previous 3 injections He is now taking 0.2 mL using the 200 mg concentration Usually taking injections on Thursday night or Friday and is quite regular with this  He does not think he is feeling unusually fatigued recently and no recent significant change in weight  However hemoglobin has become mildly increased at 17.4  His lab results showtestosterone levels as follows:   Lab Results  Component Value Date   TESTOSTERONE 561.52 08/14/2018   TESTOSTERONE 491.92 03/31/2018   TESTOSTERONE 634.55 12/27/2017   TESTOSTERONE 443.53 06/28/2017   Lab Results  Component Value Date   HGB 17.4 (H) 08/14/2018    Prolactin level: Normal   Lab Results  Component Value Date   LH <0.20 (L) 04/07/2017   Lab Results  Component Value Date   HGB 17.4 (H) 08/14/2018        Allergies as of 08/16/2018      Reactions   Bee Venom Anaphylaxis   Flagyl [metronidazole Hcl] Other (  See Comments)   Neuropathy    Nsaids    REACTION: intolereance      Medication List       Accurate as of Aug 16, 2018  9:07 AM. If you have any questions, ask your nurse or doctor.        STOP taking these medications   testosterone cypionate 100 MG/ML injection Commonly known as:  Depo-Testosterone Stopped by:  Elayne Snare, MD     TAKE these medications   ACETYL L-CARNITINE PO Take by mouth.    ACETYLCYSTEINE PO Take by mouth.   ALPHA-LIPOIC ACID PO Take by mouth.   aspirin EC 81 MG tablet Take 81 mg by mouth daily.   atorvastatin 10 MG tablet Commonly known as:  LIPITOR Take 1 tablet (10 mg total) by mouth daily.   b complex vitamins tablet Take 1 tablet by mouth daily.   buPROPion 300 MG 24 hr tablet Commonly known as:  WELLBUTRIN XL TAKE 1 TABLET BY MOUTH EVERY DAY   Cartia XT 180 MG 24 hr capsule Generic drug:  diltiazem TAKE 1 CAPSULE BY MOUTH ONCE DAILY   chlorthalidone 25 MG tablet Commonly known as:  HYGROTON TAKE 1/2-1 TABLETS (12.5-25 MG TOTAL) BY MOUTH DAILY.   clonazePAM 0.5 MG tablet Commonly known as:  KLONOPIN TAKE 1 TABLET BY MOUTH TWICE DAILY AS NEEDED FOR ANXIETY   CO Q 10 PO Take 1 capsule by mouth daily.   DAMIANA LEAF PO Take 700 mg by mouth daily.   EPINEPHrine 0.3 mg/0.3 mL Soaj injection Commonly known as:  EpiPen 2-Pak USE AS NEEDED   Fish Oil 1000 MG Caps Take 1 capsule by mouth 2 (two) times daily.   gabapentin 300 MG capsule Commonly known as:  NEURONTIN Take 2 capsules (600 mg total) by mouth at bedtime.   HYDROcodone-acetaminophen 5-325 MG tablet Commonly known as:  NORCO/VICODIN Take 0.5-1 tablets by mouth 2 (two) times daily as needed (No more than 1-2x a week use.).   Insulin Pen Needle 29G X 57.3UK Misc 1 application by Does not apply route daily.   MAGNESIUM PO Take 1 capsule by mouth daily.   NEEDLE (DISP) 23 G 23G X 1-1/2" Misc Use as directed   NEEDLE (DISP) 23 G 23G X 1" Misc Inject once a week   NON FORMULARY Beufomax   potassium chloride 10 MEQ tablet Commonly known as:  K-DUR Take 1 tablet (10 mEq total) by mouth 3 (three) times daily.   SAW PALMETTO PO Take 1 capsule by mouth 2 (two) times daily.   SYRINGE-NEEDLE (DISP) 3 ML 22G X 1-1/2" 3 ML Misc Use as directed.   Testosterone Enanthate 200 MG/ML Soln Inject 40 mg as directed once a week. Inject 0.68mLs (40mg  total) intramuscularly  once weekly.   Vitamin D 50 MCG (2000 UT) Caps Take 1 capsule by mouth daily.   Zinc 15 MG Caps Take by mouth.       Allergies:  Allergies  Allergen Reactions  . Bee Venom Anaphylaxis  . Flagyl [Metronidazole Hcl] Other (See Comments)    Neuropathy   . Nsaids     REACTION: intolereance    Past Medical History:  Diagnosis Date  . Allergic rhinitis, cause unspecified   . Anxiety   . Arthritis    OA  . Cancer ([place])    lung tumor   . 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 Cataract    forming  . Depressive disorder, not elsewhere classified   .  Esophageal reflux    past hx   . HTN (hypertension)   . Lymphocytic colitis   . Neuromuscular disorder (HCC)    neuropathy  . Neuropathy   . Other testicular hypofunction   . Palpitations   . Personal history of urinary calculi     Past Surgical History:  Procedure Laterality Date  . COLONOSCOPY    . 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 parotid tumor surgery    . TONSILLECTOMY AND ADENOIDECTOMY      Family History  Problem Relation Age of Onset  . Hypertension Mother   . Anxiety disorder Mother   . Non-Hodgkin's lymphoma Mother   . Dementia Father   . Depression Father        and anxiety  . Hypertension Father   . Sudden death Paternal Uncle 98  . Colon cancer Paternal Uncle   . Hypertension Brother   . Early death Neg Hx   . Heart disease Neg Hx        mother in 51s had lesoin  . Colon polyps Neg Hx   . Esophageal cancer Neg Hx   . Rectal cancer Neg Hx   . Stomach cancer Neg Hx     Social History:  reports that he has never smoked. He has never used smokeless tobacco. He reports current alcohol use. He reports that he does not use drugs.  Review of Systems  IMPAIRED fasting glucose:  Although his A1c is normal usually his fasting glucose is consistently high  Fasting glucose on his glucose tolerance test done on 03/31/18 was 105 but 2-hour reading  was only 103  His fasting glucose is now relatively higher at 116 Although he has not obviously gained any weight recently he has not checked this at home He is usually fairly consistent with healthy diet but has not exercised  Wt Readings from Last 3 Encounters:  05/23/18 180 lb 3.2 oz (81.7 kg)  04/06/18 181 lb 9.6 oz (82.4 kg)  04/06/18 180 lb 6.4 oz (81.8 kg)   Lab Results  Component Value Date   GLUCOSE 116 (H) 08/14/2018   GLUCOSE 110 (H) 04/20/2018   GLUCOSE 110 (H) 03/31/2018   Lab Results  Component Value Date   HGBA1C 5.4 12/27/2017   HGBA1C 5.6 04/07/2017   HGBA1C 5.5 12/28/2016   Lab Results  Component Value Date   LDLCALC 83 12/27/2017   CREATININE 1.08 08/14/2018    General Examination:   There were no vitals taken for this visit.      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  Also may have component of secondary hypogonadism with metabolic syndrome  He is taking Depo-Testosterone once a week, currently taking 40 mg  He is subjectively doing fairly well and does not complain of unusual fatigue He is having adequate and normal levels of his testosterone However since his hemoglobin is trending higher and is now 17.4 we will need to reduce his dose  He will try to use 0.15 mL on his testosterone injection which was given 30 mg instead of 40 If his testosterone level is not therapeutic on the next visit may consider changing him to the 100 mg concentration of the Depo-Testosterone for better adjustment of his dosage  Hemoglobin will be checked again on the next visit   IMPAIRED fasting glucose with normal A1c Fasting glucose is 116 which is higher than usual  Plan: He will try 30 mg testosterone weekly with 0.15 mL He will come back in about 2 months for labs and if this is therapeutic and hemoglobin is stable we will see him back in 6 months  He does need to start walking regularly or  other exercises to improve insulin sensitivity Consider metformin if he continues to have rising fasting blood sugar  Elayne Snare 08/16/2018, 9:07 AM     Note: This office note was prepared with Dragon voice recognition system technology. Any transcriptional errors that result from this process are unintentional.

## 2018-08-30 ENCOUNTER — Other Ambulatory Visit: Payer: Self-pay | Admitting: Family Medicine

## 2018-08-30 MED FILL — POTASSIUM CHL ER M10 TABLET: 10 | 30 days supply | Qty: 90 | Fill #5

## 2018-08-30 MED FILL — CHLORTHALIDONE 25 MG TABLET: 25 | 30 days supply | Qty: 30 | Fill #2

## 2018-08-30 MED FILL — clonazePAM 0.5 MG TABS: 0.5 | 30 days supply | Qty: 60 | Fill #0

## 2018-08-30 MED FILL — CARTIA XT 180 MG CAPSULE SA: 180 | 90 days supply | Qty: 90 | Fill #0

## 2018-08-30 NOTE — Telephone Encounter (Signed)
Patient Rx request Last fill 03/01/18  #60/3 Last ov 05/23/18

## 2018-09-08 MED FILL — TESTOSTERONE CYPIONATE 200: 200 | 84 days supply | Qty: 3 | Fill #2

## 2018-09-14 ENCOUNTER — Telehealth: Payer: Self-pay

## 2018-09-14 ENCOUNTER — Other Ambulatory Visit: Payer: Self-pay

## 2018-09-14 NOTE — Telephone Encounter (Signed)
Ok for patient to have labs drawn here?  Pt of Dr. Dwyane Dee. Please advise

## 2018-09-14 NOTE — Telephone Encounter (Signed)
Copied from [place] [id number]. Topic: Appointment Scheduling - Scheduling Inquiry for Clinic >> Sep 14, 2018 12:39 PM Rayann Heman wrote: Reason for CRM:pt called and stated that they would like to have endo labs done at our location. Please advise

## 2018-09-14 NOTE — Telephone Encounter (Signed)
Fine with me as long as results go to Dr. Dwyane Dee- for future reference im always ok with labs from a [facility] doctor being drawn in our building as long as results set up to go to ordering provider

## 2018-09-15 NOTE — Telephone Encounter (Signed)
Called pt and left VM to call the office.  

## 2018-09-26 ENCOUNTER — Other Ambulatory Visit: Payer: Self-pay | Admitting: Family Medicine

## 2018-09-26 MED FILL — POTASSIUM CHL ER M10 TABLET: 10 | 30 days supply | Qty: 90 | Fill #0

## 2018-09-26 MED FILL — CHLORTHALIDONE 25 MG TABLET: 25 | 30 days supply | Qty: 30 | Fill #3

## 2018-09-26 MED FILL — clonazePAM 0.5 MG TABS: 0.5 | 30 days supply | Qty: 60 | Fill #1

## 2018-10-10 ENCOUNTER — Telehealth: Payer: Self-pay

## 2018-10-10 NOTE — Telephone Encounter (Signed)
I will need to get approval by Dr. Yong Channel first

## 2018-10-10 NOTE — Telephone Encounter (Signed)
[name]-you can add PSA under screening for prostate cancer under my name if possible so it will come to my basket.  Thanks so much, [name] Page

## 2018-10-10 NOTE — Telephone Encounter (Signed)
Pt is requesting PSA be added to his labs for this Friday. Is this okay?

## 2018-10-11 ENCOUNTER — Other Ambulatory Visit: Payer: Self-pay

## 2018-10-11 DIAGNOSIS — Z125 Encounter for screening for malignant neoplasm of prostate: Secondary | ICD-10-CM

## 2018-10-11 NOTE — Telephone Encounter (Signed)
Dr. Yong Channel, I have ordered this for you. Have a great day!

## 2018-10-13 ENCOUNTER — Other Ambulatory Visit: Payer: Self-pay

## 2018-10-13 ENCOUNTER — Other Ambulatory Visit: Payer: 59

## 2018-10-13 ENCOUNTER — Other Ambulatory Visit (INDEPENDENT_AMBULATORY_CARE_PROVIDER_SITE_OTHER): Payer: 59

## 2018-10-13 DIAGNOSIS — R7301 Impaired fasting glucose: Secondary | ICD-10-CM

## 2018-10-13 DIAGNOSIS — Z79899 Other long term (current) drug therapy: Secondary | ICD-10-CM | POA: Diagnosis not present

## 2018-10-13 DIAGNOSIS — Z125 Encounter for screening for malignant neoplasm of prostate: Secondary | ICD-10-CM | POA: Diagnosis not present

## 2018-10-13 DIAGNOSIS — E291 Testicular hypofunction: Secondary | ICD-10-CM | POA: Diagnosis not present

## 2018-10-13 LAB — CBC
HCT: 49.2 % (ref 39.0–52.0)
Hemoglobin: 16.9 g/dL (ref 13.0–17.0)
MCHC: 34.3 g/dL (ref 30.0–36.0)
MCV: 95.9 fl (ref 78.0–100.0)
Platelets: 248 10*3/uL (ref 150.0–400.0)
RBC: 5.13 Mil/uL (ref 4.22–5.81)
RDW: 12.1 % (ref 11.5–15.5)
WBC: 6.2 10*3/uL (ref 4.0–10.5)

## 2018-10-13 LAB — BASIC METABOLIC PANEL
BUN: 12 mg/dL (ref 6–23)
CO2: 30 mEq/L (ref 19–32)
Calcium: 9.3 mg/dL (ref 8.4–10.5)
Chloride: 101 mEq/L (ref 96–112)
Creatinine, Ser: 0.97 mg/dL (ref 0.40–1.50)
GFR: 76.87 mL/min (ref >60.00)
Glucose, Bld: 117 mg/dL — ABNORMAL HIGH (ref 70–99)
Potassium: 3.2 mEq/L — ABNORMAL LOW (ref 3.5–5.1)
Sodium: 141 mEq/L (ref 135–145)

## 2018-10-13 LAB — TESTOSTERONE: Testosterone: 503.65 ng/dL (ref 300.00–890.00)

## 2018-10-13 LAB — GLUCOSE, RANDOM: Glucose, Bld: 117 mg/dL — ABNORMAL HIGH (ref 70–99)

## 2018-10-13 LAB — PSA: PSA: 0.23 ng/mL (ref 0.10–4.00)

## 2018-10-13 NOTE — Addendum Note (Signed)
Addended by: Jasper Loser on: 10/13/2018 10:51 AM   Modules accepted: Orders

## 2018-10-14 ENCOUNTER — Encounter: Payer: Self-pay | Admitting: Family Medicine

## 2018-10-14 DIAGNOSIS — E876 Hypokalemia: Secondary | ICD-10-CM

## 2018-10-16 MED ORDER — POTASSIUM CHLORIDE CRYS ER 10 MEQ PO TBCR: 20.0000 meq | EXTENDED_RELEASE_TABLET | Freq: Two times a day (BID) | ORAL | 3 refills | Status: DC

## 2018-10-17 ENCOUNTER — Encounter: Payer: Self-pay | Admitting: Family Medicine

## 2018-10-17 NOTE — Telephone Encounter (Signed)
Called pt and advised. Lab visit scheduled. Lab order placed.

## 2018-10-17 NOTE — Telephone Encounter (Signed)
Pt returned Brandy's call/ please advise/

## 2018-10-17 NOTE — Telephone Encounter (Signed)
Called pt and left VM to call the office.  Future lab orders placed.

## 2018-10-18 ENCOUNTER — Other Ambulatory Visit: Payer: Self-pay

## 2018-10-18 ENCOUNTER — Ambulatory Visit (INDEPENDENT_AMBULATORY_CARE_PROVIDER_SITE_OTHER): Payer: 59 | Admitting: Endocrinology

## 2018-10-18 ENCOUNTER — Encounter: Payer: Self-pay | Admitting: Endocrinology

## 2018-10-18 DIAGNOSIS — E291 Testicular hypofunction: Secondary | ICD-10-CM

## 2018-10-18 DIAGNOSIS — R7301 Impaired fasting glucose: Secondary | ICD-10-CM | POA: Diagnosis not present

## 2018-10-18 NOTE — Progress Notes (Signed)
Patient ID: [name] Page, male   DOB: 12-Mar-1951, 68 y.o.   MRN: [id number]             Chief complaint: Endocrinology follow-up   Today's office visit was provided via telemedicine using video technique Explained to the patient and the the limitations of evaluation and management by telemedicine and the availability of in person appointments.  The patient understood the limitations and agreed to proceed. Patient also understood that the telehealth visit is billable. . Location of the patient: Home . Location of the provider: Office Only the patient and myself were participating in the encounter    History of Present Illness   HYPOGONADISM:  Hypogonadismwas diagnosed in ?  2012  He at that time had complaints offatigue, some increased depression, decreased libido although he does not remember symptoms well There is no history of the following: Hot flushes, sweats, breast enlargement, long term anabolic steroid use, history of testicular injury, head injury or mumps in childhood. No history of osteopenia or low impact fracture  At that time an afternoon testosterone level was 214 and free testosterone level low at 32, normal >47   He was also evaluated by a urologist and details are not available He was treated with testosterone gel but he thinks this did not improve his testosterone levels and likely did not continue treatment  Subsequently he was again evaluated in 2015 and with a low testosterone of 239 he was started on testosterone injections With this he thinks she had some improvement in his fatigue and depression His dose had been adjusted from his initial starting dose of 100 mg every 2 weeks and he has been on various regimens including every 3 or 4 weeks injections His testosterone level has been quite variable since about 2016  RECENT history:  He has been taking 30 mg of testosterone every week and takes this on Fridays  Because of his increased  hematocrit on his initial consultation he was changed to testosterone injections every week instead of every other week  He has been given relatively lower doses of testosterone more recently because of tendency to higher hemoglobin levels again Previously on 20 mg dosage his testosterone was low His recent change was in 08/2018  Although he sometimes has mild fatigue recently he has been overall feeling fairly well He has been quite regular with taking his injections once a week  Has not had any side effects, does have mild chronic nocturia and his PSA has been quite normal  Testosterone level now has been consistently normal and now 500 Hemoglobin has gone down to 16.9  His lab results showtestosterone levels as follows:   Lab Results  Component Value Date   TESTOSTERONE 503.65 10/13/2018   TESTOSTERONE 561.52 08/14/2018   TESTOSTERONE 491.92 03/31/2018   TESTOSTERONE 634.55 12/27/2017   Lab Results  Component Value Date   HGB 16.9 10/13/2018    Prolactin level: Normal   Lab Results  Component Value Date   LH <0.20 (L) 04/07/2017   Lab Results  Component Value Date   HGB 16.9 10/13/2018        Allergies as of 10/18/2018      Reactions   Bee Venom Anaphylaxis   Flagyl [metronidazole Hcl] Other (See Comments)   Neuropathy    Nsaids    REACTION: intolereance      Medication List       Accurate as of October 18, 2018  9:04 AM. If you have any questions, ask  your nurse or doctor.        ACETYL L-CARNITINE PO Take by mouth.   ACETYLCYSTEINE PO Take by mouth.   ALPHA-LIPOIC ACID PO Take by mouth.   aspirin EC 81 MG tablet Take 81 mg by mouth daily.   atorvastatin 10 MG tablet Commonly known as: LIPITOR Take 1 tablet (10 mg total) by mouth daily.   b complex vitamins tablet Take 1 tablet by mouth daily.   buPROPion 300 MG 24 hr tablet Commonly known as: WELLBUTRIN XL TAKE 1 TABLET BY MOUTH EVERY DAY   Cartia XT 180 MG 24 hr capsule  Generic drug: diltiazem TAKE 1 CAPSULE BY MOUTH ONCE DAILY   chlorthalidone 25 MG tablet Commonly known as: HYGROTON TAKE 1/2-1 TABLETS (12.5-25 MG TOTAL) BY MOUTH DAILY.   clonazePAM 0.5 MG tablet Commonly known as: KLONOPIN TAKE 1 TABLET BY MOUTH TWICE DAILY AS NEEDED FOR ANXIETY   CO Q 10 PO Take 1 capsule by mouth daily.   DAMIANA LEAF PO Take 700 mg by mouth daily.   EPINEPHrine 0.3 mg/0.3 mL Soaj injection Commonly known as: EpiPen 2-Pak USE AS NEEDED   Fish Oil 1000 MG Caps Take 1 capsule by mouth 2 (two) times daily.   gabapentin 300 MG capsule Commonly known as: NEURONTIN Take 2 capsules (600 mg total) by mouth at bedtime.   HYDROcodone-acetaminophen 5-325 MG tablet Commonly known as: NORCO/VICODIN Take 0.5-1 tablets by mouth 2 (two) times daily as needed (No more than 1-2x a week use.).   Insulin Pen Needle 29G X 38.7FI Misc 1 application by Does not apply route daily.   MAGNESIUM PO Take 1 capsule by mouth daily.   NEEDLE (DISP) 23 G 23G X 1-1/2" Misc Use as directed   NEEDLE (DISP) 23 G 23G X 1" Misc Inject once a week   NON FORMULARY Beufomax   potassium chloride 10 MEQ tablet Commonly known as: K-DUR Take 2 tablets (20 mEq total) by mouth 2 (two) times daily.   SAW PALMETTO PO Take 1 capsule by mouth 2 (two) times daily.   SYRINGE-NEEDLE (DISP) 3 ML 22G X 1-1/2" 3 ML Misc Use as directed.   Testosterone Enanthate 200 MG/ML Soln Inject 40 mg as directed once a week. Inject 0.77mLs (40mg  total) intramuscularly once weekly.   Vitamin D 50 MCG (2000 UT) Caps Take 1 capsule by mouth daily.   Zinc 15 MG Caps Take by mouth.       Allergies:  Allergies  Allergen Reactions  . Bee Venom Anaphylaxis  . Flagyl [Metronidazole Hcl] Other (See Comments)    Neuropathy   . Nsaids     REACTION: intolereance    Past Medical History:  Diagnosis Date  . Allergic rhinitis, cause unspecified   . Anxiety   . Arthritis    OA  . Cancer ([place])     lung tumor   . 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 Cataract    forming  . Depressive disorder, not elsewhere classified   . Esophageal reflux    past hx   . HTN (hypertension)   . Lymphocytic colitis   . Neuromuscular disorder (H[facility])    neuropathy  . Neuropathy   . Other testicular hypofunction   . Palpitations   . Personal history of urinary calculi     Past Surgical History:  Procedure Laterality Date  . COLONOSCOPY    . 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  parotid tumor surgery    . TONSILLECTOMY AND ADENOIDECTOMY      Family History  Problem Relation Age of Onset  . Hypertension Mother   . Anxiety disorder Mother   . Non-Hodgkin's lymphoma Mother   . Dementia Father   . Depression Father        and anxiety  . Hypertension Father   . Sudden death Paternal Uncle 64  . Colon cancer Paternal Uncle   . Hypertension Brother   . Early death Neg Hx   . Heart disease Neg Hx        mother in 65s had lesoin  . Colon polyps Neg Hx   . Esophageal cancer Neg Hx   . Rectal cancer Neg Hx   . Stomach cancer Neg Hx     Social History:  reports that he has never smoked. He has never used smokeless tobacco. He reports current alcohol use. He reports that he does not use drugs.  Review of Systems  IMPAIRED fasting glucose: The following is a copy of the previous note:  Although his A1c is normal usually his fasting glucose is consistently high  Fasting glucose on his glucose tolerance test done on 03/31/18 was 105 but 2-hour reading was only 103  His fasting glucose is now relatively higher at 116 Although he has not obviously gained any weight recently he has not checked this at home He is usually fairly consistent with healthy diet but has not exercised  Wt Readings from Last 3 Encounters:  05/23/18 180 lb 3.2 oz (81.7 kg)  04/06/18 181 lb 9.6 oz (82.4 kg)  04/06/18 180 lb 6.4 oz (81.8 kg)   Lab Results   Component Value Date   GLUCOSE 117 (H) 10/13/2018   GLUCOSE 117 (H) 10/13/2018   GLUCOSE 116 (H) 08/14/2018   Lab Results  Component Value Date   HGBA1C 5.4 12/27/2017   HGBA1C 5.6 04/07/2017   HGBA1C 5.5 12/28/2016   Lab Results  Component Value Date   LDLCALC 83 12/27/2017   CREATININE 0.97 10/13/2018    General Examination:   There were no vitals taken for this visit.      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  Also may have component of secondary hypogonadism with metabolic syndrome  He is taking Depo-Testosterone once a week, currently taking 30 mg weekly  He has been feeling fairly good with his energy level Testosterone level has been now fairly consistent around 500 With this his hemoglobin is also not going above normal recently  IMPAIRED fasting glucose with normal A1c previously Nonfasting glucose 117 and will need to check again on the next visit   Plan: He will continue 30 mg testosterone injections weekly with 0.15 mL He will come back in about 4 months for labs and also check hemoglobin again  For his impaired fasting glucose we will check his A1c on next visit Otherwise encouraged him to continue to work on weight loss with riding his exercise bike and otherwise watching portions and calories  Elayne Snare 10/18/2018, 9:04 AM     Note: This office note was prepared with Dragon voice recognition system technology. Any transcriptional errors that result from this process are unintentional.

## 2018-10-19 ENCOUNTER — Encounter: Payer: Self-pay | Admitting: Family Medicine

## 2018-10-19 MED FILL — POTASSIUM CHL ER M10 TABLET: 10 | 30 days supply | Qty: 120 | Fill #0

## 2018-10-25 MED FILL — clonazePAM 0.5 MG TABS: 0.5 | 30 days supply | Qty: 60 | Fill #2

## 2018-10-25 MED FILL — GABAPENTIN 300 MG CAPSULE: 300 | 90 days supply | Qty: 180 | Fill #1

## 2018-10-25 MED FILL — CHLORTHALIDONE 25 MG TABLET: 25 | 30 days supply | Qty: 30 | Fill #4

## 2018-11-12 MED FILL — buPROPion HCL ER (XL) 300 M: 300 | 90 days supply | Qty: 90 | Fill #1

## 2018-11-14 ENCOUNTER — Encounter: Payer: Self-pay | Admitting: Family Medicine

## 2018-11-14 ENCOUNTER — Other Ambulatory Visit: Payer: Self-pay

## 2018-11-14 ENCOUNTER — Other Ambulatory Visit (INDEPENDENT_AMBULATORY_CARE_PROVIDER_SITE_OTHER): Payer: 59

## 2018-11-14 DIAGNOSIS — E876 Hypokalemia: Secondary | ICD-10-CM | POA: Diagnosis not present

## 2018-11-14 LAB — BASIC METABOLIC PANEL
BUN: 14 mg/dL (ref 6–23)
CO2: 32 mEq/L (ref 19–32)
Calcium: 9.7 mg/dL (ref 8.4–10.5)
Chloride: 99 mEq/L (ref 96–112)
Creatinine, Ser: 0.98 mg/dL (ref 0.40–1.50)
GFR: 75.95 mL/min (ref >60.00)
Glucose, Bld: 95 mg/dL (ref 70–99)
Potassium: 3.3 mEq/L — ABNORMAL LOW (ref 3.5–5.1)
Sodium: 138 mEq/L (ref 135–145)

## 2018-11-15 MED FILL — POTASSIUM CHL ER M10 TABLET: 10 | 30 days supply | Qty: 120 | Fill #1

## 2018-11-27 ENCOUNTER — Encounter: Payer: Self-pay | Admitting: Family Medicine

## 2018-11-27 ENCOUNTER — Telehealth: Payer: Self-pay | Admitting: Family Medicine

## 2018-11-27 MED FILL — CHLORTHALIDONE 25 MG TABS: 25 | 30 days supply | Qty: 30 | Fill #5

## 2018-11-27 MED FILL — clonazePAM 0.5 MG TABS: 0.5 | 30 days supply | Qty: 60 | Fill #3

## 2018-11-27 NOTE — Telephone Encounter (Signed)
See note from Dr. Yong Channel and contact pt to schedule virtual visit for med refill.

## 2018-11-27 NOTE — Telephone Encounter (Signed)
Pt called and wanted to get a lab done to check potassium and magnesium, he said Dr Yong Channel said it was ok but there aren't labs in, is this ok to schedule

## 2018-11-27 NOTE — Telephone Encounter (Signed)
Last OV 05/23/18 Last refill 05/23/18 #55/0 Next OV 01/16/19

## 2018-11-27 NOTE — Telephone Encounter (Signed)
Patient has been scheduled for 11/30/18 for a VV.

## 2018-11-27 NOTE — Telephone Encounter (Signed)
Need to have office visit for opioid refills- ok if this is virtual and then do labs after visit or if he prefers to do visit/labs at same time that's ok as well. [place] with using same day if needed.

## 2018-11-27 NOTE — Telephone Encounter (Signed)
Please see message and advise.  Thank you. ° °

## 2018-11-27 NOTE — Telephone Encounter (Signed)
Also needed a refill- we will discuss 11/30/2018

## 2018-11-28 NOTE — Telephone Encounter (Signed)
Called pt and advised.  

## 2018-11-30 ENCOUNTER — Encounter: Payer: Self-pay | Admitting: Family Medicine

## 2018-11-30 ENCOUNTER — Ambulatory Visit (INDEPENDENT_AMBULATORY_CARE_PROVIDER_SITE_OTHER): Payer: 59 | Admitting: Family Medicine

## 2018-11-30 DIAGNOSIS — E876 Hypokalemia: Secondary | ICD-10-CM

## 2018-11-30 DIAGNOSIS — I10 Essential (primary) hypertension: Secondary | ICD-10-CM | POA: Diagnosis not present

## 2018-11-30 DIAGNOSIS — E781 Pure hyperglyceridemia: Secondary | ICD-10-CM | POA: Diagnosis not present

## 2018-11-30 DIAGNOSIS — E559 Vitamin D deficiency, unspecified: Secondary | ICD-10-CM

## 2018-11-30 DIAGNOSIS — G894 Chronic pain syndrome: Secondary | ICD-10-CM | POA: Diagnosis not present

## 2018-11-30 DIAGNOSIS — G609 Hereditary and idiopathic neuropathy, unspecified: Secondary | ICD-10-CM

## 2018-11-30 DIAGNOSIS — R350 Frequency of micturition: Secondary | ICD-10-CM | POA: Diagnosis not present

## 2018-11-30 DIAGNOSIS — F3341 Major depressive disorder, recurrent, in partial remission: Secondary | ICD-10-CM | POA: Diagnosis not present

## 2018-11-30 MED ORDER — SILDENAFIL CITRATE 20 MG PO TABS: ORAL_TABLET | ORAL | 3 refills | Status: DC

## 2018-11-30 MED ORDER — HYDROCODONE-ACETAMINOPHEN 5-325 MG PO TABS: 0.5000 | ORAL_TABLET | Freq: Two times a day (BID) | ORAL | 0 refills | Status: DC | PRN

## 2018-11-30 MED ORDER — DILTIAZEM HCL ER COATED BEADS 180 MG PO CP24: 180.0000 mg | ORAL_CAPSULE | Freq: Every day | ORAL | 3 refills | Status: DC

## 2018-11-30 MED FILL — HYDROCODON-APAP 5-325: 5-325 | 84 days supply | Qty: 55 | Fill #0

## 2018-11-30 MED FILL — SILDENAFIL CITRATE 20 MG TA: 20 | 20 days supply | Qty: 50 | Fill #0

## 2018-11-30 MED FILL — DILTIAZEM HCL ER COATED BEA: 180 | 90 days supply | Qty: 90 | Fill #0

## 2018-11-30 NOTE — Patient Instructions (Signed)
Health Maintenance Due  Topic Date Due  . INFLUENZA VACCINE  11/04/2018    Depression screen [facility] 2/9 05/23/2018 12/21/2017 06/28/2017  Decreased Interest 1 1 1   Down, Depressed, Hopeless 1 1 1   PHQ - 2 Score 2 2 2   Altered sleeping 1 1 1   Tired, decreased energy 1 2 1   Change in appetite 1 1 1   Feeling bad or failure about yourself  1 2 1   Trouble concentrating 1 1 1   Moving slowly or fidgety/restless 1 1 0  Suicidal thoughts 0 0 0  PHQ-9 Score 8 10 7   Difficult doing work/chores Somewhat difficult Somewhat difficult Not difficult at all  Some recent data might be hidden

## 2018-11-30 NOTE — Progress Notes (Signed)
Phone [phone]   Subjective:  Virtual visit via Video note. Chief complaint: Chief Complaint  Patient presents with  . Follow-up  . Pain  . Hyperlipidemia  . Hypertension  . low testosterone   This visit type was conducted due to national recommendations for restrictions regarding the COVID-19 Pandemic (e.g. social distancing).  This format is felt to be most appropriate for this patient at this time balancing risks to patient and risks to population by having him in for in person visit.  No physical exam was performed (except for noted visual exam or audio findings with Telehealth visits).   Our team/I connected with [name] Page at  1:40 PM EDT by a video enabled telemedicine application ([email or web address] or caregility through epic) and verified that I am speaking with the correct person using two identifiers.  Location patient: Home[place] Location provider: [facility], office Persons participating in the virtual visit:  patient  Our team/I discussed the limitations of evaluation and management by telemedicine and the availability of in person appointments. In light of current covid-19 pandemic, patient also understands that we are trying to protect them by minimizing in office contact if at all possible.  The patient expressed consent for telemedicine visit and agreed to proceed. Patient understands insurance will be billed.   ROS-occasional down mood-covid-19 contributing.  Some anxiety related to COVID-19.  No fever chills reported.  He is monitored regularly with work as well for any code symptoms.  Difficulty falling asleep noted.  No SI.  Past Medical History-  Patient Active Problem List   Diagnosis Date Noted  . Chronic pain syndrome 08/31/2016    Priority: High  . History of lung cancer 12/11/2012    Priority: High  . History of adenomatous polyp of colon 03/24/2018    Priority: Medium  . Generalized anxiety disorder 03/12/2014    Priority: Medium  . Essential hypertension  03/12/2014    Priority: Medium  . Hereditary and idiopathic peripheral neuropathy 12/22/2012    Priority: Medium  . Dyspnea 07/14/2012    Priority: Medium  . Hypertriglyceridemia 07/14/2012    Priority: Medium  . Depression 02/16/2010    Priority: Medium  . Low testosterone 11/21/2007    Priority: Medium  . Eustachian tube dysfunction 12/17/2014    Priority: Low  . Nonspecific abnormal electrocardiogram (ECG) (EKG) 07/14/2012    Priority: Low  . Allergic reaction, history of 10/25/2011    Priority: Low  . Rectal or anal pain 10/01/2010    Priority: Low  . Allergic rhinitis 06/13/2007    Priority: Low  . GERD 06/13/2007    Priority: Low  . IBS (irritable bowel syndrome) 06/13/2007    Priority: Low  . Palpitations 08/11/2016  . Peroneal tendonitis of right lower extremity 05/08/2014  . SI (sacroiliac) joint dysfunction 09/19/2013  . Nonallopathic lesion of sacral region 09/19/2013  . Nonallopathic lesion of thoracic region 09/19/2013  . Nonallopathic lesion of lumbosacral region 09/19/2013  . Right foot pain 12/22/2012    Medications- reviewed and updated Current Outpatient Medications  Medication Sig Dispense Refill  . Acetylcarnitine HCl (ACETYL L-CARNITINE PO) Take by mouth.    . ACETYLCYSTEINE PO Take by mouth.    . ALPHA-LIPOIC ACID PO Take by mouth.    Marland Kitchen aspirin EC 81 MG tablet Take 81 mg by mouth daily.    Marland Kitchen atorvastatin (LIPITOR) 10 MG tablet Take 1 tablet (10 mg total) by mouth daily. 90 tablet 3  . b complex vitamins tablet Take 1 tablet by  mouth daily.      Marland Kitchen buPROPion (WELLBUTRIN XL) 300 MG 24 hr tablet TAKE 1 TABLET BY MOUTH EVERY DAY 90 tablet 3  . chlorthalidone (HYGROTON) 25 MG tablet TAKE 1/2-1 TABLETS (12.5-25 MG TOTAL) BY MOUTH DAILY. 30 tablet 5  . Cholecalciferol (VITAMIN D) 2000 UNITS CAPS Take 1 capsule by mouth daily.     . clonazePAM (KLONOPIN) 0.5 MG tablet TAKE 1 TABLET BY MOUTH TWICE DAILY AS NEEDED FOR ANXIETY 60 tablet 5  . Coenzyme Q10 (CO  Q 10 PO) Take 1 capsule by mouth daily.    [name], [name], (DAMIANA LEAF PO) Take 700 mg by mouth daily.    Marland Kitchen diltiazem (CARTIA XT) 180 MG 24 hr capsule Take 1 capsule (180 mg total) by mouth daily. 90 capsule 3  . EPINEPHrine (EPIPEN 2-PAK) 0.3 mg/0.3 mL IJ SOAJ injection USE AS NEEDED 2 Device 1  . gabapentin (NEURONTIN) 300 MG capsule Take 2 capsules (600 mg total) by mouth at bedtime. 180 capsule 1  . HYDROcodone-acetaminophen (NORCO/VICODIN) 5-325 MG tablet Take 0.5-1 tablets by mouth 2 (two) times daily as needed (No more than 1-2x a week use.). 55 tablet 0  . Insulin Pen Needle 29G X 73.5HG MISC 1 application by Does not apply route daily. 100 each 3  . MAGNESIUM PO Take 1 capsule by mouth daily.    Marland Kitchen NEEDLE, DISP, 23 G 23G X 1" MISC Inject once a week 25 each 0  . NEEDLE, DISP, 23 G 23G X 1-1/2" MISC Use as directed 100 each 3  . NON FORMULARY Beufomax    . Omega-3 Fatty Acids (FISH OIL) 1000 MG CAPS Take 1 capsule by mouth 2 (two) times daily.     . potassium chloride (K-DUR) 10 MEQ tablet Take 2 tablets (20 mEq total) by mouth 2 (two) times daily. 120 tablet 3  . Saw Palmetto, Serenoa repens, (SAW PALMETTO PO) Take 1 capsule by mouth 2 (two) times daily.    . sildenafil (REVATIO) 20 MG tablet Take 1-5 tablets as needed once every 48 hours for erectile dysfunction 50 tablet 3  . SYRINGE-NEEDLE, DISP, 3 ML 22G X 1-1/2" 3 ML MISC Use as directed. 100 each 3  . Testosterone Enanthate 200 MG/ML SOLN Inject 40 mg as directed once a week. Inject 0.27mLs (40mg  total) intramuscularly once weekly.    . Zinc 15 MG CAPS Take by mouth.     No current facility-administered medications for this visit.      Objective:  no self reported vitals Gen: NAD, resting comfortably Lungs: nonlabored, normal respiratory rate  Skin: appears dry, no obvious rash     Assessment and Plan    # Chronic Pain Syndrome/Neuropathy S:Taking Gabapentin 300 mg 2 capsules at bedtime and  Hydrocodone-APAP 5-325 mg BID prn typically twice a week.  He finds this regimen helpful. A/P: Reasonable control chronic pain/neuropathy- refill provided for hydrocodone today to hopefully last up to 6 months. -[place] reviewed.  Last hydrocodone refill was May 23, 2018.  Patient has received clonazepam and testosterone in the interval.  Patient is aware of need to try to space clonazepam and hydrocodone-ideally 12 hours. -Urine drug screen done May 23, 2018  # Depression S:Taking Bupropion XL 300 mg daily and Clonazepam 0.5 mg prn. Feels he is doing reasonably well- feels anxious when he goes out (groceries and projects around the house)  Depression screen [facility] 2/9 11/30/2018  Decreased Interest 1  Down, Depressed, Hopeless 0  PHQ - 2  Score 1  Altered sleeping 2  Tired, decreased energy 1  Change in appetite 3  Feeling bad or failure about yourself  0  Trouble concentrating 0  Moving slowly or fidgety/restless 1  Suicidal thoughts 0  PHQ-9 Score 8  Difficult doing work/chores Not difficult at all  Some recent data might be hidden  A/P: Patient feels like this is reasonable control in light of COVID-19-he would like to continue current medications.  Even though PHQ 9 is above 5-he rates "not difficult at all" for interference with wife  #hypertriglyceridemia  S: Previously controlled on Atorvastatin 10 mg daily. Also taking Omega 3.  Lab Results  Component Value Date   CHOL 144 12/27/2017   HDL 47.10 12/27/2017   LDLCALC 83 12/27/2017   TRIG 67.0 12/27/2017   CHOLHDL 3 12/27/2017   A/P: Suspect well-controlled- we will get an updated lipid panel at physical  #hypertension/hypokalemia S: Reports controlled on Cartia XT 180 mg daily, Chlorthalidone 25 mg 1/2-1 tablet daily and potassium supplement.   4 tablets twice a day potassium for 2 days and back down to 2 tablets twice a day. Magnesium has been ok. Occasional palpitations.  A/P: Hypertension well controlled at home  and in past visits. -Continue current medications, I took over the diltiazem prescription as patient has not seen Dr. Percival Spanish in some time  For hypokalemia-we will get BMP tomorrow along with magnesium when he comes back for labs.  I did go ahead and place a standing order for BMP in case he would like to check this in the future-up to 20 times over the next year.   # Low Testosterone S:Testosterone 40 mg injections weekly.  Followed by Dr. Dwyane Dee A/P: Continue to follow with Dr. Dwyane Dee- since patient on testosterone therapy reasonable to get rectal exam at physical  #Urinary frequency/urinary odor-feels like he has had some mild symptoms recently and wants to check a urinalysis- if abnormal agrees to urine culture  #Erectile dysfunction- patient would like to try sildenafil- he would like to use a low dose-discussed could use as little as 1 tablet  Recommended follow up: Patient will be due for physical later this year-he states October Future Appointments  Date Time Provider [place]  12/01/2018  9:00 AM [facility] LAB [facility]  02/20/2019  9:15 AM [facility] None  02/23/2019  9:00 AM Elayne Snare, MD [facility] None    Lab/Order associations:   ICD-10-CM   1. Hypokalemia  E99.3 Basic metabolic panel    Magnesium  2. Urinary frequency  R35.0 POCT Urinalysis Dipstick (Automated)    Meds ordered this encounter  Medications  . HYDROcodone-acetaminophen (NORCO/VICODIN) 5-325 MG tablet    Sig: Take 0.5-1 tablets by mouth 2 (two) times daily as needed (No more than 1-2x a week use.).    Dispense:  55 tablet    Refill:  0  . diltiazem (CARTIA XT) 180 MG 24 hr capsule    Sig: Take 1 capsule (180 mg total) by mouth daily.    Dispense:  90 capsule    Refill:  3  . sildenafil (REVATIO) 20 MG tablet    Sig: Take 1-5 tablets as needed once every 48 hours for erectile dysfunction    Dispense:  50 tablet    Refill:  3    Return precautions advised.  [name], MD

## 2018-12-01 ENCOUNTER — Other Ambulatory Visit: Payer: 59

## 2018-12-01 ENCOUNTER — Other Ambulatory Visit (INDEPENDENT_AMBULATORY_CARE_PROVIDER_SITE_OTHER): Payer: 59

## 2018-12-01 ENCOUNTER — Other Ambulatory Visit: Payer: Self-pay

## 2018-12-01 DIAGNOSIS — E559 Vitamin D deficiency, unspecified: Secondary | ICD-10-CM | POA: Diagnosis not present

## 2018-12-01 DIAGNOSIS — R350 Frequency of micturition: Secondary | ICD-10-CM | POA: Diagnosis not present

## 2018-12-01 DIAGNOSIS — E876 Hypokalemia: Secondary | ICD-10-CM

## 2018-12-01 LAB — MAGNESIUM: Magnesium: 2.1 mg/dL (ref 1.5–2.5)

## 2018-12-01 LAB — POC URINALSYSI DIPSTICK (AUTOMATED)
Bilirubin, UA: NEGATIVE
Blood, UA: NEGATIVE
Glucose, UA: NEGATIVE
Ketones, UA: NEGATIVE
Leukocytes, UA: NEGATIVE
Nitrite, UA: NEGATIVE
Protein, UA: NEGATIVE
Spec Grav, UA: 1.015 (ref 1.010–1.025)
Urobilinogen, UA: 0.2 E.U./dL
pH, UA: 6 (ref 5.0–8.0)

## 2018-12-01 LAB — BASIC METABOLIC PANEL
BUN: 14 mg/dL (ref 6–23)
CO2: 31 mEq/L (ref 19–32)
Calcium: 9.8 mg/dL (ref 8.4–10.5)
Chloride: 100 mEq/L (ref 96–112)
Creatinine, Ser: 1.02 mg/dL (ref 0.40–1.50)
GFR: 72.51 mL/min (ref >60.00)
Glucose, Bld: 109 mg/dL — ABNORMAL HIGH (ref 70–99)
Potassium: 4.1 mEq/L (ref 3.5–5.1)
Sodium: 140 mEq/L (ref 135–145)

## 2018-12-01 LAB — VITAMIN D 25 HYDROXY (VIT D DEFICIENCY, FRACTURES): VITD: 40.26 ng/mL (ref 30.00–100.00)

## 2018-12-04 ENCOUNTER — Telehealth: Payer: Self-pay

## 2018-12-04 NOTE — Telephone Encounter (Signed)
PA needed for Sildenafil 20 mg, take 1-5 prn, qty 50  BIN [id number] PCN [id number]

## 2018-12-05 NOTE — Telephone Encounter (Signed)
Lyman Hornback (Key: [facility])  [employer] is reviewing your PA request. You may close this dialog, return to your dashboard, and perform other tasks.  To check for an update later, open this request again from your dashboard. If [employer] has not replied within 24 hours for urgent requests or within 48 hours for standard requests, please contact [employer] at [phone].

## 2018-12-05 NOTE — Telephone Encounter (Signed)
[name] Page (Key: [facility])  Your information has been sent to [place].

## 2018-12-07 ENCOUNTER — Other Ambulatory Visit: Payer: Self-pay | Admitting: Endocrinology

## 2018-12-07 MED FILL — TESTOSTERONE CYP 200 MG/ML: 200 | 90 days supply | Qty: 2 | Fill #0

## 2018-12-07 NOTE — Telephone Encounter (Signed)
Please refill if appropriate

## 2018-12-07 NOTE — Telephone Encounter (Signed)
[name] Page (Key: [id number])  This request has received a Unfavorable outcome.  Please note any additional information provided by [employer] at the bottom of this request.

## 2018-12-19 MED FILL — POTASSIUM CHL ER M10 TABLET: 10 | 30 days supply | Qty: 120 | Fill #2

## 2018-12-26 ENCOUNTER — Other Ambulatory Visit: Payer: Self-pay | Admitting: Family Medicine

## 2018-12-26 MED FILL — clonazePAM 0.5 MG TABS: 0.5 | 30 days supply | Qty: 60 | Fill #4

## 2018-12-26 MED FILL — CHLORTHALIDONE 25 MG TABS: 25 | 90 days supply | Qty: 90 | Fill #0

## 2019-01-16 ENCOUNTER — Other Ambulatory Visit (INDEPENDENT_AMBULATORY_CARE_PROVIDER_SITE_OTHER): Payer: 59

## 2019-01-16 ENCOUNTER — Encounter: Payer: Self-pay | Admitting: Family Medicine

## 2019-01-16 ENCOUNTER — Other Ambulatory Visit: Payer: Self-pay

## 2019-01-16 ENCOUNTER — Ambulatory Visit (INDEPENDENT_AMBULATORY_CARE_PROVIDER_SITE_OTHER): Payer: 59

## 2019-01-16 ENCOUNTER — Encounter: Payer: 59 | Admitting: Family Medicine

## 2019-01-16 DIAGNOSIS — E876 Hypokalemia: Secondary | ICD-10-CM | POA: Diagnosis not present

## 2019-01-16 DIAGNOSIS — Z23 Encounter for immunization: Secondary | ICD-10-CM

## 2019-01-16 LAB — BASIC METABOLIC PANEL
BUN: 15 mg/dL (ref 6–23)
CO2: 32 mEq/L (ref 19–32)
Calcium: 10.1 mg/dL (ref 8.4–10.5)
Chloride: 99 mEq/L (ref 96–112)
Creatinine, Ser: 1.03 mg/dL (ref 0.40–1.50)
GFR: 71.68 mL/min (ref >60.00)
Glucose, Bld: 112 mg/dL — ABNORMAL HIGH (ref 70–99)
Potassium: 3.6 mEq/L (ref 3.5–5.1)
Sodium: 139 mEq/L (ref 135–145)

## 2019-01-16 MED FILL — POTASSIUM CHL ER M10 TABLET: 10 | 30 days supply | Qty: 120 | Fill #3

## 2019-01-17 ENCOUNTER — Encounter: Payer: Self-pay | Admitting: Family Medicine

## 2019-01-19 ENCOUNTER — Other Ambulatory Visit: Payer: Self-pay | Admitting: Family Medicine

## 2019-01-22 MED FILL — clonazePAM 0.5 MG TABS: 0.5 | 30 days supply | Qty: 60 | Fill #5

## 2019-01-22 MED FILL — GABAPENTIN 300 MG CAPSULE: 300 | 90 days supply | Qty: 180 | Fill #0

## 2019-01-31 DIAGNOSIS — H52203 Unspecified astigmatism, bilateral: Secondary | ICD-10-CM | POA: Diagnosis not present

## 2019-01-31 DIAGNOSIS — H524 Presbyopia: Secondary | ICD-10-CM | POA: Diagnosis not present

## 2019-02-05 ENCOUNTER — Encounter: Payer: Self-pay | Admitting: Family Medicine

## 2019-02-05 ENCOUNTER — Ambulatory Visit (INDEPENDENT_AMBULATORY_CARE_PROVIDER_SITE_OTHER): Payer: 59

## 2019-02-05 ENCOUNTER — Ambulatory Visit (INDEPENDENT_AMBULATORY_CARE_PROVIDER_SITE_OTHER): Payer: 59 | Admitting: Family Medicine

## 2019-02-05 ENCOUNTER — Other Ambulatory Visit: Payer: Self-pay

## 2019-02-05 VITALS — BP 138/86 | HR 78 | Temp 97.3°F | Ht 68.0 in | Wt 186.4 lb

## 2019-02-05 VITALS — BP 138/86 | HR 78 | Ht 68.0 in | Wt 186.0 lb

## 2019-02-05 DIAGNOSIS — Z125 Encounter for screening for malignant neoplasm of prostate: Secondary | ICD-10-CM | POA: Diagnosis not present

## 2019-02-05 DIAGNOSIS — E785 Hyperlipidemia, unspecified: Secondary | ICD-10-CM | POA: Diagnosis not present

## 2019-02-05 DIAGNOSIS — Z85118 Personal history of other malignant neoplasm of bronchus and lung: Secondary | ICD-10-CM

## 2019-02-05 DIAGNOSIS — G8929 Other chronic pain: Secondary | ICD-10-CM

## 2019-02-05 DIAGNOSIS — E876 Hypokalemia: Secondary | ICD-10-CM

## 2019-02-05 DIAGNOSIS — G894 Chronic pain syndrome: Secondary | ICD-10-CM

## 2019-02-05 DIAGNOSIS — Z86010 Personal history of colonic polyps: Secondary | ICD-10-CM

## 2019-02-05 DIAGNOSIS — Z889 Allergy status to unspecified drugs, medicaments and biological substances status: Secondary | ICD-10-CM

## 2019-02-05 DIAGNOSIS — Z0000 Encounter for general adult medical examination without abnormal findings: Secondary | ICD-10-CM | POA: Diagnosis not present

## 2019-02-05 DIAGNOSIS — I10 Essential (primary) hypertension: Secondary | ICD-10-CM | POA: Diagnosis not present

## 2019-02-05 DIAGNOSIS — F3341 Major depressive disorder, recurrent, in partial remission: Secondary | ICD-10-CM

## 2019-02-05 DIAGNOSIS — M545 Low back pain: Secondary | ICD-10-CM

## 2019-02-05 DIAGNOSIS — E781 Pure hyperglyceridemia: Secondary | ICD-10-CM | POA: Diagnosis not present

## 2019-02-05 DIAGNOSIS — E291 Testicular hypofunction: Secondary | ICD-10-CM | POA: Diagnosis not present

## 2019-02-05 DIAGNOSIS — R7989 Other specified abnormal findings of blood chemistry: Secondary | ICD-10-CM

## 2019-02-05 DIAGNOSIS — F411 Generalized anxiety disorder: Secondary | ICD-10-CM

## 2019-02-05 DIAGNOSIS — G609 Hereditary and idiopathic neuropathy, unspecified: Secondary | ICD-10-CM

## 2019-02-05 DIAGNOSIS — K219 Gastro-esophageal reflux disease without esophagitis: Secondary | ICD-10-CM

## 2019-02-05 DIAGNOSIS — R7301 Impaired fasting glucose: Secondary | ICD-10-CM

## 2019-02-05 DIAGNOSIS — Z860101 Personal history of adenomatous and serrated colon polyps: Secondary | ICD-10-CM

## 2019-02-05 LAB — CBC
HCT: 47.1 % (ref 39.0–52.0)
Hemoglobin: 16.4 g/dL (ref 13.0–17.0)
MCHC: 34.8 g/dL (ref 30.0–36.0)
MCV: 95.2 fl (ref 78.0–100.0)
Platelets: 242 10*3/uL (ref 150.0–400.0)
RBC: 4.95 Mil/uL (ref 4.22–5.81)
RDW: 12.2 % (ref 11.5–15.5)
WBC: 8.2 10*3/uL (ref 4.0–10.5)

## 2019-02-05 LAB — LIPID PANEL
Cholesterol: 172 mg/dL (ref 0–200)
HDL: 47.4 mg/dL (ref >39.00)
LDL Cholesterol: 89 mg/dL (ref 0–99)
NonHDL: 124.55
Total CHOL/HDL Ratio: 4
Triglycerides: 178 mg/dL — ABNORMAL HIGH (ref 0.0–149.0)
VLDL: 35.6 mg/dL (ref 0.0–40.0)

## 2019-02-05 LAB — COMPREHENSIVE METABOLIC PANEL
ALT: 19 U/L (ref 0–53)
AST: 16 U/L (ref 0–37)
Albumin: 4.5 g/dL (ref 3.5–5.2)
Alkaline Phosphatase: 71 U/L (ref 39–117)
BUN: 15 mg/dL (ref 6–23)
CO2: 31 mEq/L (ref 19–32)
Calcium: 9.6 mg/dL (ref 8.4–10.5)
Chloride: 99 mEq/L (ref 96–112)
Creatinine, Ser: 1.11 mg/dL (ref 0.40–1.50)
GFR: 65.74 mL/min (ref >60.00)
Glucose, Bld: 114 mg/dL — ABNORMAL HIGH (ref 70–99)
Potassium: 3.6 mEq/L (ref 3.5–5.1)
Sodium: 138 mEq/L (ref 135–145)
Total Bilirubin: 0.9 mg/dL (ref 0.2–1.2)
Total Protein: 6.7 g/dL (ref 6.0–8.3)

## 2019-02-05 LAB — HEMOGLOBIN A1C: Hgb A1c MFr Bld: 5.2 % (ref 4.6–6.5)

## 2019-02-05 LAB — TESTOSTERONE: Testosterone: 455.17 ng/dL (ref 300.00–890.00)

## 2019-02-05 LAB — PSA: PSA: 0.22 ng/mL (ref 0.10–4.00)

## 2019-02-05 LAB — TSH: TSH: 1.66 u[IU]/mL (ref 0.35–4.50)

## 2019-02-05 NOTE — Progress Notes (Signed)
X-ray lumbar spine does show some mild arthritis but not super severe.  Physical therapy should help some.  If no benefit further MRI will be very helpful.

## 2019-02-05 NOTE — Addendum Note (Signed)
Addended by: Francis Dowse T on: 02/05/2019 09:16 AM   Modules accepted: Orders

## 2019-02-05 NOTE — Progress Notes (Deleted)
duplicate

## 2019-02-05 NOTE — Patient Instructions (Addendum)
Please let us know if depression worsens. I like your idea of adding exercise. We could also get psychiatry opinion in future if needed    Team- please make sure lab releases 4 labs from Dr. Dwyane Dee on 10/18/18 in addition to 3 labs I ordered. You can cancel your upcoming lab visit   Please stop by lab before you go If you do not have mychart- we will call you about results within 5 business days of [place] receiving them.  If you have mychart- we will send your results within 3 business days of [place] receiving them.  If abnormal or we want to clarify a result, we will call or mychart you to make sure you receive the message.  If you have questions or concerns or don't hear within 5-7 days, please send [place] a message or call us.   4-6 month follow up . Consider doing some BP checks closer to visit and bringing cuff with you.

## 2019-02-05 NOTE — Addendum Note (Signed)
Addended by: Clyde Lundborg A on: 02/05/2019 09:23 AM   Modules accepted: Orders

## 2019-02-05 NOTE — Progress Notes (Signed)
Phone: [phone]   Subjective:  Patient presents today for their annual physical. Chief complaint-noted.   See problem oriented charting- ROS- full  review of systems was completed and negative  except for: activity change, some fatigue, mild dyspnea, mild leg swelling, palpitations improved, bloating, increased urination, urinary urgency, joint pain, back pain, muscle aches, color change to skin over ankles, numbnesss from neuropathy, low back pain   The following were reviewed and entered/updated in epic: Past Medical History:  Diagnosis Date  . Allergic rhinitis, cause unspecified   . Anxiety   . Arthritis    OA  . Cancer ([address])    lung tumor   . 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 Cataract    forming  . Depressive disorder, not elsewhere classified   . Esophageal reflux    past hx   . HTN (hypertension)   . Lymphocytic colitis   . Neuromuscular disorder (HCC)    neuropathy  . Neuropathy   . Other testicular hypofunction   . Palpitations   . Personal history of urinary calculi    Patient Active Problem List   Diagnosis Date Noted  . Chronic pain syndrome 08/31/2016    Priority: High  . History of lung cancer 12/11/2012    Priority: High  . History of adenomatous polyp of colon 03/24/2018    Priority: Medium  . Generalized anxiety disorder 03/12/2014    Priority: Medium  . Essential hypertension 03/12/2014    Priority: Medium  . Hereditary and idiopathic peripheral neuropathy 12/22/2012    Priority: Medium  . Dyspnea 07/14/2012    Priority: Medium  . Hypertriglyceridemia 07/14/2012    Priority: Medium  . Depression 02/16/2010    Priority: Medium  . Low testosterone 11/21/2007    Priority: Medium  . Eustachian tube dysfunction 12/17/2014    Priority: Low  . Nonspecific abnormal electrocardiogram (ECG) (EKG) 07/14/2012    Priority: Low  . Allergic reaction, history of 10/25/2011    Priority: Low  . Rectal or anal pain 10/01/2010    Priority:  Low  . Allergic rhinitis 06/13/2007    Priority: Low  . GERD 06/13/2007    Priority: Low  . IBS (irritable bowel syndrome) 06/13/2007    Priority: Low  . Palpitations 08/11/2016  . Peroneal tendonitis of right lower extremity 05/08/2014  . SI (sacroiliac) joint dysfunction 09/19/2013  . Nonallopathic lesion of sacral region 09/19/2013  . Nonallopathic lesion of thoracic region 09/19/2013  . Nonallopathic lesion of lumbosacral region 09/19/2013  . Right foot pain 12/22/2012   Past Surgical History:  Procedure Laterality Date  . COLONOSCOPY    . 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 parotid tumor surgery    . TONSILLECTOMY AND ADENOIDECTOMY      Family History  Problem Relation Age of Onset  . Hypertension Mother   . Anxiety disorder Mother   . Non-Hodgkin's lymphoma Mother   . Dementia Father   . Depression Father        and anxiety  . Hypertension Father   . Sudden death Paternal Uncle 45  . Colon cancer Paternal Uncle   . Hypertension Brother   . Early death Neg Hx   . Heart disease Neg Hx        mother in 65s had lesoin  . Colon polyps Neg Hx   . Esophageal cancer Neg Hx   . Rectal cancer Neg Hx   . Stomach cancer  Neg Hx     Medications- reviewed and updated Current Outpatient Medications  Medication Sig Dispense Refill  . Acetylcarnitine HCl (ACETYL L-CARNITINE PO) Take by mouth.    . ACETYLCYSTEINE PO Take by mouth.    . ALPHA-LIPOIC ACID PO Take by mouth.    Marland Kitchen aspirin EC 81 MG tablet Take 81 mg by mouth daily.    Marland Kitchen atorvastatin (LIPITOR) 10 MG tablet Take 1 tablet (10 mg total) by mouth daily. 90 tablet 3  . b complex vitamins tablet Take 1 tablet by mouth daily.      Marland Kitchen buPROPion (WELLBUTRIN XL) 300 MG 24 hr tablet TAKE 1 TABLET BY MOUTH EVERY DAY 90 tablet 3  . chlorthalidone (HYGROTON) 25 MG tablet TAKE 1/2-1 TABLETS BY MOUTH DAILY. 90 tablet 1  . Cholecalciferol (VITAMIN D) 2000 UNITS CAPS Take 1 capsule by  mouth daily.     . clonazePAM (KLONOPIN) 0.5 MG tablet TAKE 1 TABLET BY MOUTH TWICE DAILY AS NEEDED FOR ANXIETY 60 tablet 5  . Coenzyme Q10 (CO Q 10 PO) Take 1 capsule by mouth daily.    [name], [name], (DAMIANA LEAF PO) Take 700 mg by mouth daily.    Marland Kitchen diltiazem (CARTIA XT) 180 MG 24 hr capsule Take 1 capsule (180 mg total) by mouth daily. 90 capsule 3  . EPINEPHrine (EPIPEN 2-PAK) 0.3 mg/0.3 mL IJ SOAJ injection USE AS NEEDED 2 Device 1  . gabapentin (NEURONTIN) 300 MG capsule TAKE 2 CAPSULES BY MOUTH AT BEDTIME. 180 capsule 1  . HYDROcodone-acetaminophen (NORCO/VICODIN) 5-325 MG tablet Take 0.5-1 tablets by mouth 2 (two) times daily as needed (No more than 1-2x a week use.). 55 tablet 0  . MAGNESIUM PO Take 1 capsule by mouth daily.    Marland Kitchen NEEDLE, DISP, 23 G 23G X 1" MISC Inject once a week 25 each 0  . NON FORMULARY Beufomax    . Omega-3 Fatty Acids (FISH OIL) 1000 MG CAPS Take 1 capsule by mouth 2 (two) times daily.     . potassium chloride (K-DUR) 10 MEQ tablet Take 2 tablets (20 mEq total) by mouth 2 (two) times daily. 120 tablet 3  . Saw Palmetto, Serenoa repens, (SAW PALMETTO PO) Take 1 capsule by mouth 2 (two) times daily.    . sildenafil (REVATIO) 20 MG tablet Take 1-5 tablets as needed once every 48 hours for erectile dysfunction 50 tablet 3  . testosterone cypionate (DEPOTESTOSTERONE CYPIONATE) 200 MG/ML injection Inject 0.59ml/30mg  weekly 10 mL 5  . Testosterone Enanthate 200 MG/ML SOLN Inject 40 mg as directed once a week. Inject 0.4mLs (40mg  total) intramuscularly once weekly.    . Zinc 15 MG CAPS Take by mouth.     No current facility-administered medications for this visit.     Allergies-reviewed and updated Allergies  Allergen Reactions  . Bee Venom Anaphylaxis  . Flagyl [Metronidazole Hcl] Other (See Comments)    Neuropathy   . 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 Married 30 + years. No children, lots of critters.       Cancer Survivor- carcinoid lung cancer.   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 Martin Majestic back to work for ability to be insured.       Hobbies: music, home repair/improvement, cats   Objective  Objective:  BP 138/86   Pulse 78  Temp (!) 97.3 F (36.3 C)   Ht 5\' 8"  (1.727 m)   Wt 186 lb 6.4 oz (84.6 kg)   SpO2 97%   BMI 28.34 kg/m  Gen: NAD, resting comfortably HEENT: Mask not removed due to covid 19. TM normal. Bridge of nose normal. Eyelids normal.  Neck: no thyromegaly or cervical lymphadenopathy  CV: RRR no murmurs rubs or gallops Lungs: CTAB no crackles, wheeze, rhonchi Abdomen: soft/nontender/nondistended/normal bowel sounds. No rebound or guarding.  Ext: trace edema- some venous stasis skin changes mild Skin: warm, dry Neuro: grossly normal, moves all extremities, PERRLA    Assessment and Plan  68 y.o. male presenting for annual physical.  Health Maintenance counseling: 1. Anticipatory guidance: Patient counseled regarding regular dental exams -q6 months in general outside of covid 19, eye exams -yearly,  avoiding smoking and second hand smoke , limiting alcohol to 2 beverages per day - 1 or 2 a week.   2. Risk factor reduction:  Advised patient of need for regular exercise and diet rich and fruits and vegetables to reduce risk of heart attack and stroke. Exercise- off and on- walking 1-2 days a week. Diet-up 9 lbs from last CPE- starting to pay attentino as had noted some weight gain- knows what to eat just has to restart.  Wt Readings from Last 3 Encounters:  02/05/19 186 lb 6.4 oz (84.6 kg)  05/23/18 180 lb 3.2 oz (81.7 kg)  04/06/18 181 lb 9.6 oz (82.4 kg)  3. Immunizations/screenings/ancillary studies- wants to hold off on shingrix for now Immunization History  Administered Date(s) Administered  . Fluad Quad(high Dose 65+) 01/16/2019   . Influenza Whole 01/04/2012  . Influenza, High Dose Seasonal PF 12/21/2017  . Influenza,inj,Quad PF,6+ Mos 12/17/2014  . Influenza-Unspecified 01/17/2014, 12/28/2015, 01/01/2017  . Pneumococcal Conjugate-13 06/17/2015  . Pneumococcal Polysaccharide-23 09/07/2016  . Td 02/27/2001, 04/10/2011  . Zoster 08/24/2010  4. Prostate cancer screening-  Screen PSA with labs.   Lab Results  Component Value Date   PSA 0.23 10/13/2018   PSA 0.32 12/27/2017   PSA 0.31 04/07/2017   5. Colon cancer screening - 03/2018 History of adenomatous polyp of colon- planned repeat December 2022 6. Skin cancer screening- sees derm. advised regular sunscreen use. Denies worrisome, changing, or new skin lesions.  7. Never smoker  Status of chronic or acute concerns   Essential hypertension - Plan: Comprehensive metabolic panel, Lipid panel-compliant with chlorthalidone 25 mg, diltiazem 180 mg extended release.  Very sensitive to potassium losses-on potassium 40 mEq daily. Controlled today  - home readings 120s/70s  History of lung cancer-chest x-ray completed February 2020 with plan for around annual checks-has been released by oncology- declines CXR now   Chronic pain syndrome related to Hereditary and idiopathic peripheral neuropathy-compliant with gabapentin 600 mg at bedtime.  Also uses sparing hydrocodoneabout twice a week.  Needs to space hydrocodone and diazepam by at least 12 hours   Low testosterone-treated by Dr. Dwyane Dee.  Will release his labs with our labs today.  Also uses sildenafil for erectile dysfunction   Hypertriglyceridemia- worried about SE- did not start atorvastatin 10 mg but has on hand.  Update lipid panel today.  LDL goal at least under 100 but discussed ideal goal under 70, would at minimum target under 100.  Lab Results  Component Value Date   CHOL 144 12/27/2017   HDL 47.10 12/27/2017   LDLCALC 83 12/27/2017   TRIG 67.0 12/27/2017   CHOLHDL 3 12/27/2017    Generalized anxiety  disorder-compliant with clonazepam on as-needed basis- taking once a day most days and twice a day at time.    Recurrent major depressive disorder, in partial remission (HCC)-compliant with Wellbutrin 300 mg extended release- prior had considered cymbalta and he declined. Had palpitations on vibriid. Had talked about nortriptyline with neurology in past but was nervous to start that.  Some of his issues are related to COVID-19 and not feeling safe outside of the home. Counseling minimally helpful in past- Dr. Cheryln Manly. Feels he has trouble opening up- had seen woman therapist as well.   -declines medication intervention- he wants to get in a good regular exercise regimen to try to help.   Gastroesophageal reflux disease without esophagitis- sparing pepcid recently.   Allergic reaction, history of-has EpiPen on hand-discussed refill- declines   chronic back pain with activity- not seeing Dr. Tamala Julian recently. Somewhat hurts all over the next day but mainly in low back, feels somewhat weaker in legs. He is considering doing some stretching ahead of time. Declines PT. - wants to consider MRI in future - will place referral to sports medicine- may be able to be seen today by Dr. Georgina Snell  Prior dyspnea workup 2014- had seen pulmonology in the past- thought related to deconditioning- he still would like to focus on exercise first  Some venous insufficiency with slight reddish discoloratoin- diltiazem could contribute some- he is going to try to get some compression hose on [employer].   Vitamin D was good in august- 2k each.   Recommended follow up: 4-6 months.  Future Appointments  Date Time Provider [place]  02/05/2019  8:45 AM Gregor Hams, MD [facility]  02/20/2019  8:30 AM [facility]  02/20/2019  9:15 AM [facility] None  02/23/2019  9:00 AM Elayne Snare, MD [facility] None   Lab/Order associations: not fasting   ICD-10-CM   1. Preventative health care   Z00.00 Comprehensive metabolic panel    Lipid panel    PSA  2. Essential hypertension  I10 Comprehensive metabolic panel    Lipid panel  3. History of lung cancer  Z85.118   4. Chronic pain syndrome  G89.4   5. Low testosterone  R79.89   6. Hypertriglyceridemia  E78.1   7. History of adenomatous polyp of colon  Z86.010   8. Hereditary and idiopathic peripheral neuropathy  G60.9   9. Generalized anxiety disorder  F41.1   10. Recurrent major depressive disorder, in partial remission ([place])  F33.41   11. Gastroesophageal reflux disease without esophagitis  K21.9   12. Allergic reaction, history of  Z88.9   13. Screening for prostate cancer  Z12.5 PSA  14. Chronic bilateral low back pain without sciatica  M54.5 Ambulatory referral to Sports Medicine   G89.29    Return precautions advised.  [name], MD

## 2019-02-05 NOTE — Progress Notes (Addendum)
[name] Page is a 68 y.o. male who presents to [facility] today for low back pain.  Pt was referred by Dr. Yong Channel.  Pt states that he has chronic midline low back pain w/ no specific MOI.  Pt states that he is currently having more constant pain that is worse in the mornings and improves throughout the day.  He rates his pain at it's worst as a 7-8/10 but on average a 3-4/10 that he describes as dull that can occasionally be sharp.  Pt denies any radiating / radicular pain into his B LEs.  He does report weakness in his B thighs.  Pt states that he takes hydrocodone for B LE neuropathy.  He states that he has tried some stretching.  He has had some benefit with a chiropractor in the past but it was ongoing and expensive.  His goals are to have less overall pain and more functionality.  ROS:  As above  Exam:  BP 138/86 (BP Location: Left Arm, Patient Position: Sitting, Cuff Size: Normal)   Pulse 78   Ht 5\' 8"  (1.727 m)   Wt 186 lb (84.4 kg)   SpO2 97%   BMI 28.28 kg/m  Wt Readings from Last 5 Encounters:  02/05/19 186 lb (84.4 kg)  02/05/19 186 lb 6.4 oz (84.6 kg)  05/23/18 180 lb 3.2 oz (81.7 kg)  04/06/18 181 lb 9.6 oz (82.4 kg)  04/06/18 180 lb 6.4 oz (81.8 kg)   General: Well Developed, well nourished, and in no acute distress.  Neuro/Psych: Alert and oriented x3, extra-ocular muscles intact, able to move all 4 extremities, sensation grossly intact. Skin: Warm and dry, no rashes noted.  Respiratory: Not using accessory muscles, speaking in full sentences, trachea midline.  Cardiovascular: Pulses palpable, no extremity edema. Abdomen: Does not appear distended. MSK: L-spine: Range of motion: Normal some pain with extension. Lower extremity reflexes equal normal throughout bilateral lower extremities. Sensation is intact throughout to light touch. Strength intact throughout with the exception of left hip flexion and knee extension which is slightly reduced  at 4/5. Normal gait.    Lab and Radiology Results EXAM: LUMBAR SPINE - COMPLETE 4+ VIEW originally performed April 15, 2015  COMPARISON:  CT 05/15/2010.  FINDINGS: Diffuse degenerative change. No acute bony abnormality. No evidence of fracture or dislocation.  IMPRESSION: Diffuse degenerative change.  No acute abnormality.   Electronically Signed   By: Marcello Moores  Register   On: 04/15/2015 13:02 I personally (independently) visualized and performed the interpretation of the images attached in this note.  X-ray images L-spine obtained today personally independently reviewed DDD with narrowing the space at  L2-3, and at L4-5 and a 5 S1.  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  No acute fracture visible. Await formal radiology review   Assessment and Plan: 68 y.o. male with  Chronic low back pain with occasional flareup.  Some associated lower extremity weakness and numbness likely existing paresthesia/neuropathy. After discussion plan for trial of physical therapy, will get x-ray updated today.  Patient will send me an update with his progress of physical therapy in a few weeks and if no progress we will proceed with MRI for facet injection planning.    PDMP not reviewed this encounter. Orders Placed This Encounter  Procedures  . DG Lumbar Spine Complete    Standing Status:   Future    Standing Expiration Date:   04/06/2020    Order Specific Question:  Reason for Exam (SYMPTOM  OR DIAGNOSIS REQUIRED)    Answer:   eval chronic LBP    Order Specific Question:   Preferred imaging location?    Answer:   [facility]    Order Specific Question:   Radiology Contrast Protocol - do NOT remove file path    Answer:   [email or web address]  . Ambulatory referral to Physical Therapy    Referral Priority:   Routine    Referral Type:   Physical Medicine    Referral Reason:   Specialty Services  Required    Requested Specialty:   Physical Therapy    Number of Visits Requested:   1   No orders of the defined types were placed in this encounter.   Historical information moved to improve visibility of documentation.  Past Medical History:  Diagnosis Date  . Allergic rhinitis, cause unspecified   . Anxiety   . Arthritis    OA  . Cancer ([place])    lung tumor   . 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 Cataract    forming  . Depressive disorder, not elsewhere classified   . Esophageal reflux    past hx   . HTN (hypertension)   . Lymphocytic colitis   . Neuromuscular disorder (HCC)    neuropathy  . Neuropathy   . Other testicular hypofunction   . Palpitations   . Personal history of urinary calculi    Past Surgical History:  Procedure Laterality Date  . COLONOSCOPY    . 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 parotid tumor surgery    . TONSILLECTOMY AND ADENOIDECTOMY     Social History   Tobacco Use  . Smoking status: Never Smoker  . Smokeless tobacco: Never Used  . Tobacco comment: exposed to second hand smoke alot when he was younger  Substance Use Topics  . Alcohol use: Yes    Comment: rare occ beer with dinner   family history includes Anxiety disorder in his mother; Colon cancer in his paternal uncle; Dementia in his father; Depression in his father; Hypertension in his brother, father, and mother; Non-Hodgkin's lymphoma in his mother; Sudden death (age of onset: 43) in his paternal uncle.  Medications: Current Outpatient Medications  Medication Sig Dispense Refill  . Acetylcarnitine HCl (ACETYL L-CARNITINE PO) Take by mouth.    . ACETYLCYSTEINE PO Take by mouth.    . ALPHA-LIPOIC ACID PO Take by mouth.    Marland Kitchen aspirin EC 81 MG tablet Take 81 mg by mouth daily.    Marland Kitchen atorvastatin (LIPITOR) 10 MG tablet Take 1 tablet (10 mg total) by mouth daily. 90 tablet 3  . b complex vitamins tablet Take 1 tablet by mouth  daily.      Marland Kitchen buPROPion (WELLBUTRIN XL) 300 MG 24 hr tablet TAKE 1 TABLET BY MOUTH EVERY DAY 90 tablet 3  . chlorthalidone (HYGROTON) 25 MG tablet TAKE 1/2-1 TABLETS BY MOUTH DAILY. 90 tablet 1  . Cholecalciferol (VITAMIN D) 2000 UNITS CAPS Take 1 capsule by mouth daily.     . clonazePAM (KLONOPIN) 0.5 MG tablet TAKE 1 TABLET BY MOUTH TWICE DAILY AS NEEDED FOR ANXIETY 60 tablet 5  . Coenzyme Q10 (CO Q 10 PO) Take 1 capsule by mouth daily.    [name], [name], (DAMIANA LEAF PO) Take 700 mg by mouth daily.    Marland Kitchen diltiazem (CARTIA XT) 180 MG 24 hr capsule Take  1 capsule (180 mg total) by mouth daily. 90 capsule 3  . EPINEPHrine (EPIPEN 2-PAK) 0.3 mg/0.3 mL IJ SOAJ injection USE AS NEEDED 2 Device 1  . gabapentin (NEURONTIN) 300 MG capsule TAKE 2 CAPSULES BY MOUTH AT BEDTIME. 180 capsule 1  . HYDROcodone-acetaminophen (NORCO/VICODIN) 5-325 MG tablet Take 0.5-1 tablets by mouth 2 (two) times daily as needed (No more than 1-2x a week use.). 55 tablet 0  . MAGNESIUM PO Take 1 capsule by mouth daily.    Marland Kitchen NEEDLE, DISP, 23 G 23G X 1" MISC Inject once a week 25 each 0  . NON FORMULARY Beufomax    . Omega-3 Fatty Acids (FISH OIL) 1000 MG CAPS Take 1 capsule by mouth 2 (two) times daily.     . potassium chloride (K-DUR) 10 MEQ tablet Take 2 tablets (20 mEq total) by mouth 2 (two) times daily. 120 tablet 3  . Saw Palmetto, Serenoa repens, (SAW PALMETTO PO) Take 1 capsule by mouth 2 (two) times daily.    . sildenafil (REVATIO) 20 MG tablet Take 1-5 tablets as needed once every 48 hours for erectile dysfunction 50 tablet 3  . testosterone cypionate (DEPOTESTOSTERONE CYPIONATE) 200 MG/ML injection Inject 0.77ml/30mg  weekly 10 mL 5  . Testosterone Enanthate 200 MG/ML SOLN Inject 40 mg as directed once a week. Inject 0.27mLs (40mg  total) intramuscularly once weekly.    . Zinc 15 MG CAPS Take by mouth.     No current facility-administered medications for this visit.    Allergies  Allergen Reactions  .  Bee Venom Anaphylaxis  . Flagyl [Metronidazole Hcl] Other (See Comments)    Neuropathy   . Nsaids     REACTION: intolereance      Discussed warning signs or symptoms. Please see discharge instructions. Patient expresses understanding.   The above documentation has been reviewed and is accurate and complete Lynne Leader

## 2019-02-05 NOTE — Patient Instructions (Addendum)
Thank you for coming in today.  You should hear from PT about scheduling soon.  Send me a Pharmacist, community message in a few weeks with an update.  Get xray now.   Come back or go to the emergency room if you notice new weakness new numbness problems walking or bowel or bladder problems.

## 2019-02-09 MED FILL — BUPROPION HCL ER (XL) 300 M: 300 | 90 days supply | Qty: 90 | Fill #2

## 2019-02-12 ENCOUNTER — Other Ambulatory Visit: Payer: Self-pay | Admitting: Family Medicine

## 2019-02-12 ENCOUNTER — Other Ambulatory Visit: Payer: Self-pay

## 2019-02-12 MED FILL — POTASSIUM CHL ER M10 TABLET: 10 | 30 days supply | Qty: 120 | Fill #0

## 2019-02-20 ENCOUNTER — Other Ambulatory Visit: Payer: 59

## 2019-02-23 ENCOUNTER — Ambulatory Visit (INDEPENDENT_AMBULATORY_CARE_PROVIDER_SITE_OTHER): Payer: 59 | Admitting: Endocrinology

## 2019-02-23 ENCOUNTER — Other Ambulatory Visit: Payer: Self-pay

## 2019-02-23 ENCOUNTER — Other Ambulatory Visit: Payer: Self-pay | Admitting: Family Medicine

## 2019-02-23 ENCOUNTER — Encounter: Payer: Self-pay | Admitting: Endocrinology

## 2019-02-23 DIAGNOSIS — E291 Testicular hypofunction: Secondary | ICD-10-CM | POA: Diagnosis not present

## 2019-02-23 DIAGNOSIS — R7301 Impaired fasting glucose: Secondary | ICD-10-CM | POA: Diagnosis not present

## 2019-02-23 MED ORDER — TESTOSTERONE CYPIONATE 200 MG/ML IM SOLN: INTRAMUSCULAR | 5 refills | Status: DC

## 2019-02-23 MED FILL — TESTOSTERONE CYP 200 MG/ML: 200 | 21 days supply | Qty: 3 | Fill #0

## 2019-02-23 MED FILL — DILTIAZEM HCL ER COATED BEA: 180 | 90 days supply | Qty: 90 | Fill #1

## 2019-02-23 NOTE — Telephone Encounter (Signed)
Last OV: 02/05/19 Next OV: 06/05/19 PMP website checked: last filled on 01/22/19

## 2019-02-23 NOTE — Progress Notes (Signed)
Patient ID: [name] Page, male   DOB: March 17, 1951, 68 y.o.   MRN: [id number]             Chief complaint: Endocrinology follow-up   Today's office visit was provided via telemedicine using video technique Explained to the patient and the the limitations of evaluation and management by telemedicine and the availability of in person appointments.  The patient understood the limitations and agreed to proceed. Patient also understood that the telehealth visit is billable. . Location of the patient: Home . Location of the provider: Office Only the patient and myself were participating in the encounter    History of Present Illness   HYPOGONADISM:  Hypogonadismwas diagnosed in ?  2012  He at that time had complaints offatigue, some increased depression, decreased libido although he does not remember symptoms well There is no history of the following: Hot flushes, sweats, breast enlargement, long term anabolic steroid use, history of testicular injury, head injury or mumps in childhood. No history of osteopenia or low impact fracture  At that time an afternoon testosterone level was 214 and free testosterone level low at 32, normal >47   He was also evaluated by a urologist and details are not available He was treated with testosterone gel but he thinks this did not improve his testosterone levels and likely did not continue treatment  Subsequently he was again evaluated in 2015 and with a low testosterone of 239 he was started on testosterone injections With this he thinks she had some improvement in his fatigue and depression His dose had been adjusted from his initial starting dose of 100 mg every 2 weeks and he has been on various regimens including every 3 or 4 weeks injections His testosterone level had been quite variable since about 2016  RECENT history:  He has been taking 30 mg of testosterone every week and takes this on Fridays  Because of his increased  hematocrit on his initial consultation he was changed to testosterone injections every week instead of every other week After some adjustment of the doses he is now taking the above dose since 08/2018 He has been very regular with taking his injections on Fridays However he thinks that recently he has been running out of medication even though he is getting a total of 2 mL from the pharmacy for 3 months  Occasionally will feel tired but not consistently and no different than before No weakness or decreased muscle strength  Testosterone level now has been consistently normal, recent level 455 done about 3 weeks ago  Hemoglobin has been stable, last 16.4  His lab results showtestosterone levels as follows:   Lab Results  Component Value Date   TESTOSTERONE 455.17 02/05/2019   TESTOSTERONE 503.65 10/13/2018   TESTOSTERONE 561.52 08/14/2018   TESTOSTERONE 491.92 03/31/2018   Lab Results  Component Value Date   HGB 16.4 02/05/2019    Prolactin level: Normal   Lab Results  Component Value Date   LH <0.20 (L) 04/07/2017   Lab Results  Component Value Date   HGB 16.4 02/05/2019        Allergies as of 02/23/2019      Reactions   Bee Venom Anaphylaxis   Flagyl [metronidazole Hcl] Other (See Comments)   Neuropathy    Nsaids    REACTION: intolereance      Medication List       Accurate as of February 23, 2019  8:08 AM. If you have any questions, ask your nurse  or doctor.        ACETYL L-CARNITINE PO Take by mouth.   ACETYLCYSTEINE PO Take by mouth.   ALPHA-LIPOIC ACID PO Take by mouth.   aspirin EC 81 MG tablet Take 81 mg by mouth daily.   atorvastatin 10 MG tablet Commonly known as: LIPITOR Take 1 tablet (10 mg total) by mouth daily.   b complex vitamins tablet Take 1 tablet by mouth daily.   buPROPion 300 MG 24 hr tablet Commonly known as: WELLBUTRIN XL TAKE 1 TABLET BY MOUTH EVERY DAY   chlorthalidone 25 MG tablet Commonly known as:  HYGROTON TAKE 1/2-1 TABLETS BY MOUTH DAILY.   clonazePAM 0.5 MG tablet Commonly known as: KLONOPIN TAKE 1 TABLET BY MOUTH TWICE DAILY AS NEEDED FOR ANXIETY   CO Q 10 PO Take 1 capsule by mouth daily.   DAMIANA LEAF PO Take 700 mg by mouth daily.   diltiazem 180 MG 24 hr capsule Commonly known as: Cartia XT Take 1 capsule (180 mg total) by mouth daily.   EPINEPHrine 0.3 mg/0.3 mL Soaj injection Commonly known as: EpiPen 2-Pak USE AS NEEDED   Fish Oil 1000 MG Caps Take 1 capsule by mouth 2 (two) times daily.   gabapentin 300 MG capsule Commonly known as: NEURONTIN TAKE 2 CAPSULES BY MOUTH AT BEDTIME.   HYDROcodone-acetaminophen 5-325 MG tablet Commonly known as: NORCO/VICODIN Take 0.5-1 tablets by mouth 2 (two) times daily as needed (No more than 1-2x a week use.).   MAGNESIUM PO Take 1 capsule by mouth daily.   NEEDLE (DISP) 23 G 23G X 1" Misc Inject once a week   NON FORMULARY Beufomax   potassium chloride 10 MEQ tablet Commonly known as: KLOR-CON TAKE 2 TABLETS (20 MEQ TOTAL) BY MOUTH 2 (TWO) TIMES DAILY.   SAW PALMETTO PO Take 1 capsule by mouth 2 (two) times daily.   sildenafil 20 MG tablet Commonly known as: REVATIO Take 1-5 tablets as needed once every 48 hours for erectile dysfunction   testosterone cypionate 200 MG/ML injection Commonly known as: DEPOTESTOSTERONE CYPIONATE Inject 0.70ml/30mg  weekly   Testosterone Enanthate 200 MG/ML Soln Inject 40 mg as directed once a week. Inject 0.8mLs (40mg  total) intramuscularly once weekly.   Vitamin D 50 MCG (2000 UT) Caps Take 1 capsule by mouth daily.   Zinc 15 MG Caps Take by mouth.       Allergies:  Allergies  Allergen Reactions  . Bee Venom Anaphylaxis  . Flagyl [Metronidazole Hcl] Other (See Comments)    Neuropathy   . Nsaids     REACTION: intolereance    Past Medical History:  Diagnosis Date  . Allergic rhinitis, cause unspecified   . Anxiety   . Arthritis    OA  . Cancer ([place])     lung tumor   . 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 Cataract    forming  . Depressive disorder, not elsewhere classified   . Esophageal reflux    past hx   . HTN (hypertension)   . Lymphocytic colitis   . Neuromuscular disorder ([facility])    neuropathy  . Neuropathy   . Other testicular hypofunction   . Palpitations   . Personal history of urinary calculi     Past Surgical History:  Procedure Laterality Date  . COLONOSCOPY    . 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 parotid tumor surgery    . TONSILLECTOMY AND ADENOIDECTOMY  Family History  Problem Relation Age of Onset  . Hypertension Mother   . Anxiety disorder Mother   . Non-Hodgkin's lymphoma Mother   . Dementia Father   . Depression Father        and anxiety  . Hypertension Father   . Sudden death Paternal Uncle 29  . Colon cancer Paternal Uncle   . Hypertension Brother   . Early death Neg Hx   . Heart disease Neg Hx        mother in 41s had lesoin  . Colon polyps Neg Hx   . Esophageal cancer Neg Hx   . Rectal cancer Neg Hx   . Stomach cancer Neg Hx     Social History:  reports that he has never smoked. He has never used smokeless tobacco. He reports current alcohol use. He reports that he does not use drugs.  Review of Systems  IMPAIRED fasting glucose:    Although his A1c is normal usually his fasting glucose is consistently high  Fasting glucose on his glucose tolerance test done on 03/31/18 was 105 but 2-hour reading was only 103  His fasting glucose has not been checked, nonfasting glucose 114  He is usually fairly consistent with healthy diet but has gained some weight He is doing some walking and exercise bike  Wt Readings from Last 3 Encounters:  02/05/19 186 lb (84.4 kg)  02/05/19 186 lb 6.4 oz (84.6 kg)  05/23/18 180 lb 3.2 oz (81.7 kg)   Lab Results  Component Value Date   GLUCOSE 114 (H) 02/05/2019   GLUCOSE 112 (H) 01/16/2019    GLUCOSE 109 (H) 12/01/2018   Lab Results  Component Value Date   HGBA1C 5.2 02/05/2019   HGBA1C 5.4 12/27/2017   HGBA1C 5.6 04/07/2017   Lab Results  Component Value Date   LDLCALC 89 02/05/2019   CREATININE 1.11 02/05/2019    General Examination:   There were no vitals taken for this visit.     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  He is taking Depo-Testosterone once a week, continues to be on a stable dose of taking 30 mg weekly  He has been feeling mostly good and no unusual fatigue Testosterone level has been now fairly consistently in the mid normal range With this his hemoglobin is also not going above normal He has been quite regular with his injections  IMPAIRED fasting glucose with normal A1c  Nonfasting glucose 114 and will need to check fasting glucose on the next visit Encourage him to increase exercise, moderate carbohydrates and high-calorie foods  LIPIDS: His PCP wants him to have a statin drug but he is afraid of muscle aches with Lipitor  Plan: He will continue 30 mg testosterone injections weekly with 0.15 mL We will check with his pharmacy to see the issues he is having with not getting enough medication to last him 3 months  Encouraged him to try the Lipitor to see if he can tolerate it as 90% of people will do well with this May be reasonable for him to use a statin drug since he has impaired fasting glucose even though his LDL has been below 100  Follow-up in 6 months  Elsy Chiang 02/23/2019, 8:08 AM     Note: This office note was prepared with Dragon voice recognition system technology. Any transcriptional errors that result from this process are unintentional.

## 2019-02-24 MED FILL — clonazePAM 0.5 MG TABS: 0.5 | 30 days supply | Qty: 60 | Fill #0

## 2019-03-12 MED FILL — POTASSIUM CHLORIDE CRYS ER: 10 | 30 days supply | Qty: 120 | Fill #1

## 2019-03-22 MED FILL — CHLORTHALIDONE 25 MG TABS: 25 | 90 days supply | Qty: 90 | Fill #1

## 2019-03-23 MED FILL — clonazePAM 0.5 MG TABS: 0.5 | 30 days supply | Qty: 60 | Fill #1

## 2019-04-09 MED FILL — POTASSIUM CHLORIDE CRYS ER: 10 | 30 days supply | Qty: 120 | Fill #2

## 2019-04-20 MED FILL — clonazePAM 0.5 MG TABS: 0.5 | 30 days supply | Qty: 60 | Fill #2

## 2019-04-20 MED FILL — GABAPENTIN 300 MG CAPSULE: 300 | 90 days supply | Qty: 180 | Fill #1

## 2019-05-04 ENCOUNTER — Encounter: Payer: 59 | Admitting: Family Medicine

## 2019-05-04 MED FILL — POTASSIUM CHLORIDE CRYS ER: 10 | 30 days supply | Qty: 120 | Fill #3

## 2019-05-04 MED FILL — BUPROPION HCL XL 300 MG TAB: 300 | 30 days supply | Qty: 30 | Fill #3

## 2019-05-21 MED FILL — DILTIAZEM HCL ER COATED BEA: 180 | 90 days supply | Qty: 90 | Fill #2

## 2019-05-21 MED FILL — clonazePAM 0.5 MG TABS: 0.5 | 30 days supply | Qty: 60 | Fill #3

## 2019-05-21 MED FILL — TESTOSTERONE CYP 200 MG/ML: 200 | 21 days supply | Qty: 3 | Fill #1

## 2019-06-05 ENCOUNTER — Encounter: Payer: Self-pay | Admitting: Family Medicine

## 2019-06-05 ENCOUNTER — Ambulatory Visit (INDEPENDENT_AMBULATORY_CARE_PROVIDER_SITE_OTHER): Payer: 59 | Admitting: Family Medicine

## 2019-06-05 ENCOUNTER — Ambulatory Visit (INDEPENDENT_AMBULATORY_CARE_PROVIDER_SITE_OTHER): Payer: 59

## 2019-06-05 VITALS — BP 144/62 | HR 72 | Temp 97.0°F | Ht 68.0 in | Wt 184.8 lb

## 2019-06-05 DIAGNOSIS — G8929 Other chronic pain: Secondary | ICD-10-CM | POA: Insufficient documentation

## 2019-06-05 DIAGNOSIS — F411 Generalized anxiety disorder: Secondary | ICD-10-CM | POA: Diagnosis not present

## 2019-06-05 DIAGNOSIS — G894 Chronic pain syndrome: Secondary | ICD-10-CM

## 2019-06-05 DIAGNOSIS — F3341 Major depressive disorder, recurrent, in partial remission: Secondary | ICD-10-CM | POA: Diagnosis not present

## 2019-06-05 DIAGNOSIS — E781 Pure hyperglyceridemia: Secondary | ICD-10-CM

## 2019-06-05 DIAGNOSIS — Z85118 Personal history of other malignant neoplasm of bronchus and lung: Secondary | ICD-10-CM | POA: Diagnosis not present

## 2019-06-05 DIAGNOSIS — I878 Other specified disorders of veins: Secondary | ICD-10-CM | POA: Insufficient documentation

## 2019-06-05 DIAGNOSIS — G609 Hereditary and idiopathic neuropathy, unspecified: Secondary | ICD-10-CM

## 2019-06-05 DIAGNOSIS — M545 Low back pain, unspecified: Secondary | ICD-10-CM | POA: Insufficient documentation

## 2019-06-05 DIAGNOSIS — Z79899 Other long term (current) drug therapy: Secondary | ICD-10-CM

## 2019-06-05 DIAGNOSIS — I10 Essential (primary) hypertension: Secondary | ICD-10-CM

## 2019-06-05 DIAGNOSIS — R0602 Shortness of breath: Secondary | ICD-10-CM | POA: Diagnosis not present

## 2019-06-05 LAB — BASIC METABOLIC PANEL
BUN: 14 mg/dL (ref 6–23)
CO2: 30 mEq/L (ref 19–32)
Calcium: 10.2 mg/dL (ref 8.4–10.5)
Chloride: 100 mEq/L (ref 96–112)
Creatinine, Ser: 1.05 mg/dL (ref 0.40–1.50)
GFR: 70.02 mL/min (ref >60.00)
Glucose, Bld: 117 mg/dL — ABNORMAL HIGH (ref 70–99)
Potassium: 4.1 mEq/L (ref 3.5–5.1)
Sodium: 139 mEq/L (ref 135–145)

## 2019-06-05 MED ORDER — HYDROCODONE-ACETAMINOPHEN 5-325 MG PO TABS: 0.5000 | ORAL_TABLET | Freq: Two times a day (BID) | ORAL | 0 refills | Status: DC | PRN

## 2019-06-05 MED FILL — HYDROCODON-APAP 5-325: 5-325 | 30 days supply | Qty: 55 | Fill #0

## 2019-06-05 MED FILL — BuPROPion HCL ER (XL) 300 M: 300 | 30 days supply | Qty: 30 | Fill #4

## 2019-06-05 NOTE — Progress Notes (Signed)
Phone [phone] In person visit   Subjective:   [name] Page is a 69 y.o. year old very pleasant male patient who presents for/with See problem oriented charting Chief Complaint  Patient presents with  . Pain  . Hypertension    This visit occurred during the SARS-CoV-2 public health emergency.  Safety protocols were in place, including screening questions prior to the visit, additional usage of staff PPE, and extensive cleaning of exam room while observing appropriate contact time as indicated for disinfecting solutions.   Past Medical History-  Patient Active Problem List   Diagnosis Date Noted  . Chronic pain syndrome 08/31/2016    Priority: High  . History of lung cancer 12/11/2012    Priority: High  . History of adenomatous polyp of colon 03/24/2018    Priority: Medium  . Generalized anxiety disorder 03/12/2014    Priority: Medium  . Essential hypertension 03/12/2014    Priority: Medium  . Peripheral neuropathy, idiopathic 12/22/2012    Priority: Medium  . Dyspnea 07/14/2012    Priority: Medium  . Hypertriglyceridemia 07/14/2012    Priority: Medium  . Depression 02/16/2010    Priority: Medium  . Low testosterone 11/21/2007    Priority: Medium  . Chronic bilateral low back pain 06/05/2019    Priority: Low  . Venous stasis of both lower extremities 06/05/2019    Priority: Low  . Mastoiditis 03/09/2017    Priority: Low  . Palpitations 08/11/2016    Priority: Low  . Eustachian tube dysfunction 12/17/2014    Priority: Low  . SI (sacroiliac) joint dysfunction 09/19/2013    Priority: Low  . Nonspecific abnormal electrocardiogram (ECG) (EKG) 07/14/2012    Priority: Low  . Allergic reaction, history of 10/25/2011    Priority: Low  . Rectal or anal pain 10/01/2010    Priority: Low  . Allergic rhinitis 06/13/2007    Priority: Low  . GERD 06/13/2007    Priority: Low  . IBS (irritable bowel syndrome) 06/13/2007    Priority: Low  . Nonallopathic lesion of  sacral region 09/19/2013  . Nonallopathic lesion of thoracic region 09/19/2013  . Nonallopathic lesion of lumbosacral region 09/19/2013    Medications- reviewed and updated Current Outpatient Medications  Medication Sig Dispense Refill  . Acetylcarnitine HCl (ACETYL L-CARNITINE PO) Take by mouth.    . ALPHA-LIPOIC ACID PO Take by mouth.    Marland Kitchen aspirin EC 81 MG tablet Take 81 mg by mouth daily.    Marland Kitchen b complex vitamins tablet Take 1 tablet by mouth daily.      Marland Kitchen buPROPion (WELLBUTRIN XL) 300 MG 24 hr tablet TAKE 1 TABLET BY MOUTH EVERY DAY 90 tablet 3  . chlorthalidone (HYGROTON) 25 MG tablet TAKE 1/2-1 TABLETS BY MOUTH DAILY. 90 tablet 1  . Cholecalciferol (VITAMIN D) 2000 UNITS CAPS Take 1 capsule by mouth daily.     . clonazePAM (KLONOPIN) 0.5 MG tablet TAKE 1 TABLET BY MOUTH TWICE DAILY AS NEEDED FOR ANXIETY 60 tablet 5  . diltiazem (CARTIA XT) 180 MG 24 hr capsule Take 1 capsule (180 mg total) by mouth daily. 90 capsule 3  . EPINEPHrine (EPIPEN 2-PAK) 0.3 mg/0.3 mL IJ SOAJ injection USE AS NEEDED 2 Device 1  . gabapentin (NEURONTIN) 300 MG capsule TAKE 2 CAPSULES BY MOUTH AT BEDTIME. 180 capsule 1  . HYDROcodone-acetaminophen (NORCO/VICODIN) 5-325 MG tablet Take 0.5-1 tablets by mouth 2 (two) times daily as needed (No more than 1-2x a week use.). 55 tablet 0  . MAGNESIUM PO  Take 1 capsule by mouth daily.    Marland Kitchen NEEDLE, DISP, 23 G 23G X 1" MISC Inject once a week 25 each 0  . NON FORMULARY Beufomax    . potassium chloride (KLOR-CON) 10 MEQ tablet TAKE 2 TABLETS (20 MEQ TOTAL) BY MOUTH 2 (TWO) TIMES DAILY. 120 tablet 3  . Saw Palmetto, Serenoa repens, (SAW PALMETTO PO) Take 1 capsule by mouth 2 (two) times daily.    . sildenafil (REVATIO) 20 MG tablet Take 1-5 tablets as needed once every 48 hours for erectile dysfunction 50 tablet 3  . Testosterone Enanthate 200 MG/ML SOLN Inject 40 mg as directed once a week. Inject 0.107mLs (40mg  total) intramuscularly once weekly.    . Zinc 15 MG CAPS Take  by mouth.    Marland Kitchen atorvastatin (LIPITOR) 10 MG tablet Take 1 tablet (10 mg total) by mouth daily. (Patient not taking: Reported on 06/05/2019) 90 tablet 3  . Coenzyme Q10 (CO Q 10 PO) Take 1 capsule by mouth daily.    [name], [name], (DAMIANA LEAF PO) Take 700 mg by mouth daily.    . Omega-3 Fatty Acids (FISH OIL) 1000 MG CAPS Take 1 capsule by mouth 2 (two) times daily.      No current facility-administered medications for this visit.     Objective:  BP (!) 144/62   Pulse 72   Temp (!) 97 F (36.1 C) (Temporal)   Ht 5\' 8"  (1.727 m)   Wt 184 lb 12.8 oz (83.8 kg)   SpO2 95%   BMI 28.10 kg/m  Gen: NAD, resting comfortably CV: RRR no murmurs rubs or gallops Lungs: CTAB no crackles, wheeze, rhonchi  Ext: no edema Skin: warm, dry     Assessment and Plan  # Hypertension /hypokalemia on potassium S:Patient has brought list from home for review. Home readings have been in 140/70's. Has been taking diltiazem 180. Marland Kitchendenies any chest pian, sob or leg swelling. Feels like he is tolerating much better than amlodipine.   He is currently taking chlorthalidone 25 mg, diltiazem 180 mg extended release.  Amlodipine caused edema.   154/87 left arm home cuff. 152/82 on my check BP Readings from Last 3 Encounters:  06/05/19 (!) 144/62  02/05/19 138/86  02/05/19 138/86  A/P: mild poor control - from avs " Lets try one month of your improved diet to see how blood pressure responds. If not better in 1 month consider increasing to diltiazem to 240mg  Xr ". Would monitor HR as he states tends to be in 60s at home.  - he read a book by Dr. Lyndel Safe about lifestyle changes and wants to focus on those - home cuff reasonably   # Chronic Pain Syndrome /neuropathy S:compliant with gabapentin 600 mg at bedtime.  Also uses sparing hydrocodoneabout twice a week.  Needs to space hydrocodone and diazepam by at least 12 hours  A/P: issues largely stable. Is feeling weaker in legs- doing stationary bike as  hard to be on feet/walk long periods.  - pdmp reviewed and no high risk utilization  - update uds today -update bmp for potassium  # Hypertriglyceridemia S:was given Lipitor 10mg  but has not started yet. Afraid that the muscle aches will make him think he is getting covid.   A/P: likely poor control- encouraged him to start lipitor when he is ready.    # Depression/GAD S: PHq9 slightly elevate dprimarily due to nocturia and having to wake up for this- otherwise score would be under 5. He  feels wellbutrin gives reasonable relief.   For anxiety using clonazepam 1-2x a day still- trying to cut back.  Depression screen PHQ 2/9 06/05/2019  Decreased Interest 1  Down, Depressed, Hopeless 0  PHQ - 2 Score 1  Altered sleeping 3  Tired, decreased energy 0  Change in appetite 2  Feeling bad or failure about yourself  0  Trouble concentrating 1  Moving slowly or fidgety/restless 0  Suicidal thoughts 0  PHQ-9 Score 7  Difficult doing work/chores Not difficult at all  Some recent data might be hidden  A/P: reasonable control- would love to see lower use of benzo but has not tolerated SSRI due to palpitations so cannot layer that on.  -could consider buspirone- in his reflection he feels like there is a reason that was not tried but we could not narrow it down today  #history of lung cancer- will update his annual chest x-ray. Has had slight cough for a while due to some post nasal drip per his report  Recommended follow up: Return in about 4 months (around 10/05/2019) for follow up- or sooner if needed.  Lab/Order associations:   ICD-10-CM   1. Chronic pain syndrome  G89.4   2. Peripheral neuropathy, idiopathic  G60.9   3. Hypertriglyceridemia  E78.1   4. Generalized anxiety disorder  F41.1   5. Essential hypertension  W09 Basic metabolic panel  6. Recurrent major depressive disorder, in partial remission ([place])  F33.41   7. History of lung cancer  Z85.118 DG Chest 2 View  8. High risk  medication use  Z79.899 Pain Mgmt, Profile 8 w/Conf, U    Meds ordered this encounter  Medications  . HYDROcodone-acetaminophen (NORCO/VICODIN) 5-325 MG tablet    Sig: Take 0.5-1 tablets by mouth 2 (two) times daily as needed (No more than 1-2x a week use.).    Dispense:  55 tablet    Refill:  0    Return precautions advised.  [name], MD

## 2019-06-05 NOTE — Patient Instructions (Addendum)
Please stop by x-ray and lab before you go If you do not have mychart- we will call you about results within 5 business days of [place] receiving them.  If you have mychart- we will send your results within 3 business days of [place] receiving them.  If abnormal or we want to clarify a result, we will call or mychart you to make sure you receive the message.  If you have questions or concerns or don't hear within 5 business days, please send [place] a message or call us.   [email or web address] Lets try one month of your improved diet to see how blood pressure responds. If not better in 1 month consider increasing to diltiazem to 240mg  Xr  Recommended follow up: Return in about 4 months (around 10/05/2019) for follow up- or sooner if needed.

## 2019-06-06 LAB — PAIN MGMT, PROFILE 8 W/CONF, U
6 Acetylmorphine: NEGATIVE ng/mL
Alcohol Metabolites: NEGATIVE ng/mL (ref <500)
Amphetamines: NEGATIVE ng/mL
Benzodiazepines: NEGATIVE ng/mL
Buprenorphine, Urine: NEGATIVE ng/mL
Cocaine Metabolite: NEGATIVE ng/mL
Creatinine: 43.3 mg/dL
MDMA: NEGATIVE ng/mL
Marijuana Metabolite: NEGATIVE ng/mL
Opiates: NEGATIVE ng/mL
Oxidant: NEGATIVE ug/mL
Oxycodone: NEGATIVE ng/mL
pH: 5.4 (ref 4.5–9.0)

## 2019-06-11 ENCOUNTER — Other Ambulatory Visit: Payer: Self-pay | Admitting: Family Medicine

## 2019-06-12 MED FILL — POTASSIUM CHLORIDE CRYS ER: 10 | 30 days supply | Qty: 120 | Fill #0

## 2019-06-20 ENCOUNTER — Other Ambulatory Visit: Payer: Self-pay | Admitting: Family Medicine

## 2019-06-20 MED FILL — clonazePAM 0.5 MG TABS: 0.5 | 30 days supply | Qty: 60 | Fill #4

## 2019-06-21 MED FILL — CHLORTHALIDONE 25 MG TABS: 25 | 90 days supply | Qty: 90 | Fill #0

## 2019-06-27 ENCOUNTER — Encounter: Payer: Self-pay | Admitting: Family Medicine

## 2019-07-02 ENCOUNTER — Other Ambulatory Visit: Payer: Self-pay | Admitting: Family Medicine

## 2019-07-02 MED FILL — buPROPion HCL ER (XL) 300 M: 300 | 30 days supply | Qty: 30 | Fill #0

## 2019-07-10 MED FILL — AMOX-CLAV 875-125 MG TABLET: 875-125 | 10 days supply | Qty: 20 | Fill #0

## 2019-07-12 ENCOUNTER — Other Ambulatory Visit: Payer: Self-pay | Admitting: Family Medicine

## 2019-07-12 MED FILL — GABAPENTIN 300 MG CAPSULE: 300 | 90 days supply | Qty: 180 | Fill #0

## 2019-07-12 MED FILL — POTASSIUM CHLORIDE CRYS ER: 10 | 30 days supply | Qty: 120 | Fill #1

## 2019-07-20 MED FILL — clonazePAM 0.5 MG TABS: 0.5 | 30 days supply | Qty: 60 | Fill #5

## 2019-08-08 MED FILL — POTASSIUM CHLORIDE CRYS ER: 10 | 30 days supply | Qty: 120 | Fill #2

## 2019-08-08 MED FILL — buPROPion HCL ER (XL) 300 M: 300 | 30 days supply | Qty: 30 | Fill #1

## 2019-08-21 ENCOUNTER — Other Ambulatory Visit (INDEPENDENT_AMBULATORY_CARE_PROVIDER_SITE_OTHER): Payer: 59

## 2019-08-21 ENCOUNTER — Other Ambulatory Visit: Payer: Self-pay

## 2019-08-21 DIAGNOSIS — G894 Chronic pain syndrome: Secondary | ICD-10-CM | POA: Diagnosis not present

## 2019-08-21 LAB — BASIC METABOLIC PANEL
BUN: 15 mg/dL (ref 6–23)
CO2: 30 mEq/L (ref 19–32)
Calcium: 9.3 mg/dL (ref 8.4–10.5)
Chloride: 101 mEq/L (ref 96–112)
Creatinine, Ser: 1 mg/dL (ref 0.40–1.50)
GFR: 74.03 mL/min (ref >60.00)
Glucose, Bld: 119 mg/dL — ABNORMAL HIGH (ref 70–99)
Potassium: 3.6 mEq/L (ref 3.5–5.1)
Sodium: 138 mEq/L (ref 135–145)

## 2019-09-05 ENCOUNTER — Other Ambulatory Visit: Payer: Self-pay | Admitting: Family Medicine

## 2019-09-05 MED FILL — POTASSIUM CHLORIDE CRYS ER: 10 | 30 days supply | Qty: 120 | Fill #3

## 2019-09-05 MED FILL — clonazePAM 0.5 MG TABS: 0.5 | 30 days supply | Qty: 60 | Fill #0

## 2019-09-05 MED FILL — buPROPion HCL ER (XL) 300 M: 300 | 30 days supply | Qty: 30 | Fill #2

## 2019-09-19 MED FILL — CHLORTHALIDONE 25 MG TABS: 25 | 90 days supply | Qty: 90 | Fill #1

## 2019-10-03 ENCOUNTER — Other Ambulatory Visit: Payer: Self-pay | Admitting: Family Medicine

## 2019-10-03 MED FILL — POTASSIUM CHLORIDE CRYS ER: 10 | 30 days supply | Qty: 120 | Fill #0

## 2019-10-03 MED FILL — buPROPion HCL ER (XL) 300 M: 300 | 30 days supply | Qty: 30 | Fill #3

## 2019-10-03 MED FILL — clonazePAM 0.5 MG TABS: 0.5 | 30 days supply | Qty: 60 | Fill #1

## 2019-10-03 MED FILL — GABAPENTIN 300 MG CAPSULE: 300 | 90 days supply | Qty: 180 | Fill #1

## 2019-10-30 NOTE — Progress Notes (Signed)
Phone [phone] In person visit   Subjective:   [name] Page is a 69 y.o. year old very pleasant male patient who presents for/with See problem oriented charting Chief Complaint  Patient presents with  . Hypertension  . Depression  . Gastroesophageal Reflux   This visit occurred during the SARS-CoV-2 public health emergency.  Safety protocols were in place, including screening questions prior to the visit, additional usage of staff PPE, and extensive cleaning of exam room while observing appropriate contact time as indicated for disinfecting solutions.   Past Medical History-  Patient Active Problem List   Diagnosis Date Noted  . Chronic pain syndrome 08/31/2016    Priority: High  . History of lung cancer 12/11/2012    Priority: High  . History of adenomatous polyp of colon 03/24/2018    Priority: Medium  . Generalized anxiety disorder 03/12/2014    Priority: Medium  . Essential hypertension 03/12/2014    Priority: Medium  . Peripheral neuropathy, idiopathic 12/22/2012    Priority: Medium  . Dyspnea 07/14/2012    Priority: Medium  . Hypertriglyceridemia 07/14/2012    Priority: Medium  . Depression 02/16/2010    Priority: Medium  . Low testosterone 11/21/2007    Priority: Medium  . Chronic bilateral low back pain 06/05/2019    Priority: Low  . Venous stasis of both lower extremities 06/05/2019    Priority: Low  . Mastoiditis 03/09/2017    Priority: Low  . Palpitations 08/11/2016    Priority: Low  . Eustachian tube dysfunction 12/17/2014    Priority: Low  . SI (sacroiliac) joint dysfunction 09/19/2013    Priority: Low  . Nonspecific abnormal electrocardiogram (ECG) (EKG) 07/14/2012    Priority: Low  . Allergic reaction, history of 10/25/2011    Priority: Low  . Rectal or anal pain 10/01/2010    Priority: Low  . Allergic rhinitis 06/13/2007    Priority: Low  . GERD 06/13/2007    Priority: Low  . IBS (irritable bowel syndrome) 06/13/2007    Priority:  Low  . Nonallopathic lesion of sacral region 09/19/2013  . Nonallopathic lesion of thoracic region 09/19/2013  . Nonallopathic lesion of lumbosacral region 09/19/2013    Medications- reviewed and updated Current Outpatient Medications  Medication Sig Dispense Refill  . Acetylcarnitine HCl (ACETYL L-CARNITINE PO) Take by mouth.    Marland Kitchen aspirin EC 81 MG tablet Take 81 mg by mouth daily. Three times a week    . b complex vitamins tablet Take 1 tablet by mouth daily.      Marland Kitchen buPROPion (WELLBUTRIN XL) 300 MG 24 hr tablet TAKE 1 TABLET BY MOUTH EVERY DAY 30 tablet 3  . chlorthalidone (HYGROTON) 25 MG tablet TAKE 1/2 TO 1 TABLET BY MOUTH DAILY 90 tablet 1  . Cholecalciferol (VITAMIN D) 2000 UNITS CAPS Take 1 capsule by mouth daily.     . clonazePAM (KLONOPIN) 0.5 MG tablet TAKE 1 TABLET BY MOUTH TWICE DAILY AS NEEDED FOR ANXIETY 60 tablet 2  . Damiana, [name], (DAMIANA LEAF PO) Take 700 mg by mouth daily.    Marland Kitchen diltiazem (CARTIA XT) 180 MG 24 hr capsule Take 1 capsule (180 mg total) by mouth daily. 90 capsule 3  . EPINEPHrine (EPIPEN 2-PAK) 0.3 mg/0.3 mL IJ SOAJ injection USE AS NEEDED 2 Device 1  . gabapentin (NEURONTIN) 300 MG capsule TAKE 2 CAPSULES BY MOUTH AT BEDTIME. 180 capsule 1  . HYDROcodone-acetaminophen (NORCO/VICODIN) 5-325 MG tablet Take 0.5-1 tablets by mouth 2 (two) times daily as  needed (No more than 1-3x a week use.). 55 tablet 0  . MAGNESIUM PO Take 1 capsule by mouth daily.    Marland Kitchen NEEDLE, DISP, 23 G 23G X 1" MISC Inject once a week 25 each 0  . NON FORMULARY Beufomax    . potassium chloride (KLOR-CON) 10 MEQ tablet TAKE 2 TABLETS BY MOUTH TWO TIMES DAILY 120 tablet 3  . Saw Palmetto, Serenoa repens, (SAW PALMETTO PO) Take 1 capsule by mouth 2 (two) times daily.    . Testosterone Enanthate 200 MG/ML SOLN Inject 40 mg as directed once a week. Inject 0.74mLs (40mg  total) intramuscularly once weekly.    . Zinc 15 MG CAPS Take by mouth.    . ALPHA-LIPOIC ACID PO Take by mouth.  (Patient not taking: Reported on 11/01/2019)    . atorvastatin (LIPITOR) 10 MG tablet Take 1 tablet (10 mg total) by mouth daily. 90 tablet 3  . Coenzyme Q10 (CO Q 10 PO) Take 1 capsule by mouth daily. (Patient not taking: Reported on 11/01/2019)    . Omega-3 Fatty Acids (FISH OIL) 1000 MG CAPS Take 1 capsule by mouth 2 (two) times daily.  (Patient not taking: Reported on 11/01/2019)    . sildenafil (REVATIO) 20 MG tablet Take 1-5 tablets as needed once every 48 hours for erectile dysfunction (Patient not taking: Reported on 11/01/2019) 50 tablet 3   No current facility-administered medications for this visit.     Objective:  BP (!) 132/68   Pulse 64   Temp 98.1 F (36.7 C) (Temporal)   Ht 5\' 8"  (1.727 m)   Wt 181 lb (82.1 kg)   SpO2 96%   BMI 27.52 kg/m  Gen: NAD, resting comfortably CV: RRR no murmurs rubs or gallops Lungs: CTAB no crackles, wheeze, rhonchi Abdomen: soft/nontender/nondistended/normal bowel sounds.  Ext: trace edema Skin: warm, dry    Assessment and Plan   #History of lung cancer-we follow an annual chest x-ray on him.  Last time March 2021   #Chronic pain syndrome/neuropathy S: Medication: Gabapentin 600 mg at bedtime, sparing hydrocodone two to three x a week with worsening.  He knows to space hydrocodone and clonazepam at least 12 hours   Neuropathy slightly worse lately- he asks if iron could perhaps been low. Has tried lidocaine roll on and helps some short term. Worse being on feet working in yard. Has also tried some magnesium soaks.   -Does stationary bike for exercise as far to be on his feet for prolonged periods, slight weight work.   A/P: slight worsening- will check iron per his preference.  Refill hydrodocodone #55 for another 4 months- if uses 16 weeks  For 3x a week would still give 7 extra pills and has about 2 left.  -PDMP reviewed and low risk utilization.   -UDS last updated June 05, 2019  -Controlled substance contract on file from 06/28/17    #hypertension/hypokalemia requiring potassium S: medication: Chlorthalidone 25 mg, diltiazem 180 mg extended release (amlodipine caused edema in past).  Some elevations in the past and have considered increasing diltiazem to 240 mg  Home readings #s: home readings have looked excellent 17-133/72-82 BP Readings from Last 3 Encounters:  11/01/19 (!) 132/68  06/05/19 (!) 144/62  02/05/19 138/86  A/P: improved control this visit- continue curren tmeds  #Hypertriglyceridemia S: Medication:I prescribed atorvastatin 10 mg in the past patient has been concerned about potential for muscle aches, he started 10 mg daily about 3 days ago.   Had microvascular ischemia on  prior MRI Lab Results  Component Value Date   CHOL 172 02/05/2019   HDL 47.40 02/05/2019   LDLCALC 89 02/05/2019   TRIG 178.0 (H) 02/05/2019   CHOLHDL 4 02/05/2019   A/P: Hopefully cholesterol is improving with starting atorvastatin-we will check a lipid panel at next visit   # Depression/GAD S: Medication:Wellbutrin 300 mg.  Uses clonazepam 1-2 times a day-we have discussed trying to cut back.  Has not tolerated SSRIs due to palpitations.  Has seen Dr. Cheryln Manly in the past Depression screen [facility] 2/9 11/01/2019 06/05/2019 02/05/2019  Decreased Interest 0 1 1  Down, Depressed, Hopeless 1 0 1  PHQ - 2 Score 1 1 2   Altered sleeping 1 3 2   Tired, decreased energy 1 0 -  Change in appetite 3 2 1   Feeling bad or failure about yourself  1 0 1  Trouble concentrating 1 1 1   Moving slowly or fidgety/restless 1 0 0  Suicidal thoughts 0 0 0  PHQ-9 Score 9 7 7   Difficult doing work/chores Not difficult at all Not difficult at all -  Some recent data might be hidden  A/P: partial remission. He prefers not to make adjustments at this time.   #Hypogonadism-on testosterone treatment with Dr. Dwyane Dee    Recommended follow up: Return in about 4 months (around 03/03/2020) for physical or sooner if needed.   Lab/Order associations:    ICD-10-CM   1. Essential hypertension  I10 Comprehensive metabolic panel    CBC with Differential/Platelet  2. Recurrent major depressive disorder, in partial remission ([place])  F33.41   3. Chronic pain syndrome  G89.4   4. Peripheral neuropathy, idiopathic  G60.9 Iron, TIBC and Ferritin Panel    Meds ordered this encounter  Medications  . HYDROcodone-acetaminophen (NORCO/VICODIN) 5-325 MG tablet    Sig: Take 0.5-1 tablets by mouth 2 (two) times daily as needed (No more than 1-3x a week use.).    Dispense:  55 tablet    Refill:  0  . atorvastatin (LIPITOR) 10 MG tablet    Sig: Take 1 tablet (10 mg total) by mouth daily.    Dispense:  90 tablet    Refill:  3    Return precautions advised.  [name], MD

## 2019-10-30 NOTE — Patient Instructions (Addendum)
Please stop by lab before you go If you have mychart- we will send your results within 3 business days of [place] receiving them.  If you do not have mychart- we will call you about results within 5 business days of [place] receiving them.    Neuropathy: continue the gabapentin at night. Glad that is helping. Continue the Norco 2-3 times a week. If need increases let our office know. You will be due for refill on that in 4 months.   Blood pressure: Thank you for checking at home and bringing log. Home readings look good today no changes on medications today.   Cholesterol: continue medications we will recheck labs at your physical.   We are going to check your iron levels today we will let you know results once received.   Depression: Your scores are a little higher this time. If you start having some  increase in symptome increase let our office know we may need to make some changes to your treatment plan.   Make a follow up with Dr.  Dwyane Dee. If he does not check your testosterone remind [place] at your physical and we will check then.    Hope your wife is able to get vaccination soon. Im glad you are able to continue to work from home.     If you have any questions or concerns before your next scheduled appointment please give our office a call.

## 2019-10-31 ENCOUNTER — Other Ambulatory Visit: Payer: Self-pay | Admitting: Family Medicine

## 2019-10-31 MED FILL — clonazePAM 0.5 MG TABS: 0.5 | 30 days supply | Qty: 60 | Fill #2

## 2019-10-31 MED FILL — POTASSIUM CHLORIDE CRYS ER: 10 | 30 days supply | Qty: 120 | Fill #1

## 2019-10-31 MED FILL — buPROPion HCL ER (XL) 300 M: 300 | 30 days supply | Qty: 30 | Fill #0

## 2019-11-01 ENCOUNTER — Ambulatory Visit: Payer: 59 | Admitting: Family Medicine

## 2019-11-01 ENCOUNTER — Encounter: Payer: Self-pay | Admitting: Family Medicine

## 2019-11-01 ENCOUNTER — Other Ambulatory Visit: Payer: Self-pay

## 2019-11-01 VITALS — BP 132/68 | HR 64 | Temp 98.1°F | Ht 68.0 in | Wt 181.0 lb

## 2019-11-01 DIAGNOSIS — F3341 Major depressive disorder, recurrent, in partial remission: Secondary | ICD-10-CM

## 2019-11-01 DIAGNOSIS — I10 Essential (primary) hypertension: Secondary | ICD-10-CM

## 2019-11-01 DIAGNOSIS — G894 Chronic pain syndrome: Secondary | ICD-10-CM

## 2019-11-01 DIAGNOSIS — G609 Hereditary and idiopathic neuropathy, unspecified: Secondary | ICD-10-CM

## 2019-11-01 MED ORDER — ATORVASTATIN CALCIUM 10 MG PO TABS: 10.0000 mg | ORAL_TABLET | Freq: Every day | ORAL | 3 refills | Status: DC

## 2019-11-01 MED ORDER — HYDROCODONE-ACETAMINOPHEN 5-325 MG PO TABS: 0.5000 | ORAL_TABLET | Freq: Two times a day (BID) | ORAL | 0 refills | Status: DC | PRN

## 2019-11-01 MED FILL — HYDROCODON-APAP 5-325: 5-325 | 64 days supply | Qty: 55 | Fill #0

## 2019-11-01 MED FILL — BD NEEDLE 18GX1 1/2: 18G X 1-1/2 | 700 days supply | Qty: 100 | Fill #0

## 2019-11-01 MED FILL — ATORVASTATIN CALCIUM 10 MG: 10 | 90 days supply | Qty: 90 | Fill #0

## 2019-11-01 MED FILL — BD 3 ML SYRINGE WITH NEEDLE: 22G X 1" | 700 days supply | Qty: 100 | Fill #0

## 2019-11-02 LAB — COMPREHENSIVE METABOLIC PANEL
AG Ratio: 2 (calc) (ref 1.0–2.5)
ALT: 26 U/L (ref 9–46)
AST: 23 U/L (ref 10–35)
Albumin: 4.4 g/dL (ref 3.6–5.1)
Alkaline phosphatase (APISO): 71 U/L (ref 35–144)
BUN: 15 mg/dL (ref 7–25)
CO2: 30 mmol/L (ref 20–32)
Calcium: 9.8 mg/dL (ref 8.6–10.3)
Chloride: 102 mmol/L (ref 98–110)
Creat: 1 mg/dL (ref 0.70–1.25)
Globulin: 2.2 g/dL (calc) (ref 1.9–3.7)
Glucose, Bld: 105 mg/dL — ABNORMAL HIGH (ref 65–99)
Potassium: 4.1 mmol/L (ref 3.5–5.3)
Sodium: 140 mmol/L (ref 135–146)
Total Bilirubin: 1.4 mg/dL — ABNORMAL HIGH (ref 0.2–1.2)
Total Protein: 6.6 g/dL (ref 6.1–8.1)

## 2019-11-02 LAB — IRON,TIBC AND FERRITIN PANEL
Ferritin: 75 ng/mL (ref 24–380)
Iron: 192 ug/dL — ABNORMAL HIGH (ref 50–180)

## 2019-11-02 LAB — CBC WITH DIFFERENTIAL/PLATELET
Absolute Monocytes: 794 cells/uL (ref 200–950)
Basophils Absolute: 37 cells/uL (ref 0–200)
Basophils Relative: 0.6 %
Eosinophils Absolute: 149 cells/uL (ref 15–500)
Eosinophils Relative: 2.4 %
HCT: 49.3 % (ref 38.5–50.0)
Hemoglobin: 17 g/dL (ref 13.2–17.1)
Lymphs Abs: 1680 cells/uL (ref 850–3900)
MCH: 33.1 pg — ABNORMAL HIGH (ref 27.0–33.0)
MCHC: 34.5 g/dL (ref 32.0–36.0)
MCV: 95.9 fL (ref 80.0–100.0)
MPV: 9.3 fL (ref 7.5–12.5)
Monocytes Relative: 12.8 %
Neutro Abs: 3540 cells/uL (ref 1500–7800)
Neutrophils Relative %: 57.1 %
Platelets: 219 10*3/uL (ref 140–400)
RBC: 5.14 10*6/uL (ref 4.20–5.80)
RDW: 11.5 % (ref 11.0–15.0)
Total Lymphocyte: 27.1 %
WBC: 6.2 10*3/uL (ref 3.8–10.8)

## 2019-11-09 ENCOUNTER — Other Ambulatory Visit: Payer: Self-pay | Admitting: Endocrinology

## 2019-11-09 ENCOUNTER — Other Ambulatory Visit: Payer: Self-pay | Admitting: Family Medicine

## 2019-11-09 MED FILL — DILTIAZEM HCL ER COATED BEA: 180 | 90 days supply | Qty: 90 | Fill #0

## 2019-11-09 NOTE — Telephone Encounter (Signed)
Called pt to confirm dosage. He states he is taking 30mg  qwk. When asked to schedule a lab appt. Pt states he would prefer that Dr. Dwyane Dee put in orders so he can have them drawn at [place]. He also requested that a CBC, HgbA1C and Testosterone be certain to be added to the orders. Advised I would pass his request along to Dr. Dwyane Dee for him to review. Verbalized acceptance and understanding.

## 2019-11-09 NOTE — Telephone Encounter (Signed)
Please review and refill if appropriate. It appears the Rx was discontinued by someone outside this office.

## 2019-11-09 NOTE — Telephone Encounter (Signed)
Please check with him to see if he is still taking 30 mg weekly.  Also needs to be scheduled for follow-up with labs, no appointment currently

## 2019-11-11 ENCOUNTER — Other Ambulatory Visit: Payer: Self-pay | Admitting: Endocrinology

## 2019-11-11 NOTE — Telephone Encounter (Signed)
Labs already ordered, needs appts.Marland Kitchen

## 2019-11-12 MED FILL — TESTOSTERONE CYP 200 MG/ML: 200 | 60 days supply | Qty: 3 | Fill #0

## 2019-11-12 NOTE — Telephone Encounter (Signed)
Prescription was sent

## 2019-11-12 NOTE — Telephone Encounter (Signed)
Patient has scheduled follow up appointment 12/12/19. Patient will contact [facility] regarding labs.Patient needs testosterone RX sent to pharmacy as soon as possible.

## 2019-11-12 NOTE — Telephone Encounter (Signed)
Patient notified RX has been sent.

## 2019-11-21 ENCOUNTER — Other Ambulatory Visit: Payer: 59

## 2019-11-28 ENCOUNTER — Other Ambulatory Visit: Payer: Self-pay

## 2019-11-28 ENCOUNTER — Other Ambulatory Visit (INDEPENDENT_AMBULATORY_CARE_PROVIDER_SITE_OTHER): Payer: 59

## 2019-11-28 DIAGNOSIS — E291 Testicular hypofunction: Secondary | ICD-10-CM | POA: Diagnosis not present

## 2019-11-28 DIAGNOSIS — R7301 Impaired fasting glucose: Secondary | ICD-10-CM

## 2019-11-28 LAB — COMPREHENSIVE METABOLIC PANEL
ALT: 28 U/L (ref 0–53)
AST: 25 U/L (ref 0–37)
Albumin: 4.3 g/dL (ref 3.5–5.2)
Alkaline Phosphatase: 71 U/L (ref 39–117)
BUN: 15 mg/dL (ref 6–23)
CO2: 32 mEq/L (ref 19–32)
Calcium: 9.8 mg/dL (ref 8.4–10.5)
Chloride: 100 mEq/L (ref 96–112)
Creatinine, Ser: 1.13 mg/dL (ref 0.40–1.50)
GFR: 64.24 mL/min (ref >60.00)
Glucose, Bld: 141 mg/dL — ABNORMAL HIGH (ref 70–99)
Potassium: 3.6 mEq/L (ref 3.5–5.1)
Sodium: 138 mEq/L (ref 135–145)
Total Bilirubin: 1.1 mg/dL (ref 0.2–1.2)
Total Protein: 6.8 g/dL (ref 6.0–8.3)

## 2019-11-28 LAB — TESTOSTERONE: Testosterone: 478.06 ng/dL (ref 300.00–890.00)

## 2019-11-28 LAB — CBC
HCT: 47.6 % (ref 39.0–52.0)
Hemoglobin: 16.3 g/dL (ref 13.0–17.0)
MCHC: 34.3 g/dL (ref 30.0–36.0)
MCV: 95.3 fl (ref 78.0–100.0)
Platelets: 228 10*3/uL (ref 150.0–400.0)
RBC: 4.99 Mil/uL (ref 4.22–5.81)
RDW: 12 % (ref 11.5–15.5)
WBC: 7.2 10*3/uL (ref 4.0–10.5)

## 2019-11-28 LAB — HEMOGLOBIN A1C: Hgb A1c MFr Bld: 5.5 % (ref 4.6–6.5)

## 2019-11-29 ENCOUNTER — Other Ambulatory Visit: Payer: Self-pay | Admitting: Family Medicine

## 2019-11-29 MED FILL — POTASSIUM CHLORIDE CRYS ER: 10 | 30 days supply | Qty: 120 | Fill #2

## 2019-11-29 MED FILL — buPROPion HCL ER (XL) 300 M: 300 | 30 days supply | Qty: 30 | Fill #1

## 2019-11-30 MED FILL — clonazePAM 0.5 MG TABS: 0.5 | 30 days supply | Qty: 60 | Fill #0

## 2019-11-30 NOTE — Telephone Encounter (Signed)
Requesting refill, last OV 10/2019.

## 2019-12-12 ENCOUNTER — Telehealth (INDEPENDENT_AMBULATORY_CARE_PROVIDER_SITE_OTHER): Payer: 59 | Admitting: Endocrinology

## 2019-12-12 ENCOUNTER — Other Ambulatory Visit: Payer: Self-pay

## 2019-12-12 ENCOUNTER — Encounter: Payer: Self-pay | Admitting: Endocrinology

## 2019-12-12 DIAGNOSIS — E291 Testicular hypofunction: Secondary | ICD-10-CM

## 2019-12-12 DIAGNOSIS — R7301 Impaired fasting glucose: Secondary | ICD-10-CM

## 2019-12-12 NOTE — Progress Notes (Signed)
Patient ID: [name] Page, male   DOB: 10-19-1950, 69 y.o.   MRN: [id number]             Chief complaint: Endocrinology follow-up  I connected with the above-named patient by video enabled telemedicine application and verified that I am speaking with the correct person. The patient was explained the limitations of evaluation and management by telemedicine and the availability of in person appointments.  Patient also understood that there may be a patient responsible charge related to this service  Location of the patient: Patient's home  Location of the provider: Physician office Only the patient and myself were participating in the encounter The patient understood the above statements and agreed to proceed.   History of Present Illness   HYPOGONADISM:  Hypogonadismwas diagnosed in ?  2012  He at that time had complaints offatigue, some increased depression, decreased libido although he does not remember symptoms well There is no history of the following: Hot flushes, sweats, breast enlargement, long term anabolic steroid use, history of testicular injury, head injury or mumps in childhood. No history of osteopenia or low impact fracture  At that time an afternoon testosterone level was 214 and free testosterone level low at 32, normal >47   He was also evaluated by a urologist and details are not available He was treated with testosterone gel but he thinks this did not improve his testosterone levels and likely did not continue treatment  Subsequently he was again evaluated in 2015 and with a low testosterone of 239 he was started on testosterone injections With this he thinks she had some improvement in his fatigue and depression His dose had been adjusted from his initial starting dose of 100 mg every 2 weeks and he has been on various regimens including every 3 or 4 weeks injections His testosterone level had been quite variable since about 2016  RECENT  history:  He has been taking 30 mg of testosterone weekly  Because of his increased hematocrit on his initial consultation he was changed to testosterone injections every week instead of every other week After initial adjustment of the dosage regimen he is taking the above dose consistently since 08/2018 He has been very regular with taking his injections Has not had any follow-up since 11/20  He has had no fatigue or decreased motivation, does continue to have decreased libido No weakness, is able to do various activities at home  Testosterone level now has been consistently normal, recently 478 Lab was checked about midway between his injections  Hemoglobin has been stable and about the same as before  His lab results showtestosterone levels as follows:   Lab Results  Component Value Date   TESTOSTERONE 478.06 11/28/2019   TESTOSTERONE 455.17 02/05/2019   TESTOSTERONE 503.65 10/13/2018   TESTOSTERONE 561.52 08/14/2018   Lab Results  Component Value Date   HGB 16.3 11/28/2019    Prolactin level: Normal   Lab Results  Component Value Date   LH <0.20 (L) 04/07/2017   Lab Results  Component Value Date   HGB 16.3 11/28/2019        Allergies as of 12/12/2019      Reactions   Bee Venom Anaphylaxis   Flagyl [metronidazole Hcl] Other (See Comments)   Neuropathy    Nsaids    REACTION: intolereance      Medication List       Accurate as of December 12, 2019  9:49 AM. If you have any questions, ask your nurse  or doctor.        ACETYL L-CARNITINE PO Take by mouth.   ALPHA-LIPOIC ACID PO Take by mouth.   aspirin EC 81 MG tablet Take 81 mg by mouth daily. Three times a week   atorvastatin 10 MG tablet Commonly known as: LIPITOR Take 1 tablet (10 mg total) by mouth daily.   b complex vitamins tablet Take 1 tablet by mouth daily.   buPROPion 300 MG 24 hr tablet Commonly known as: WELLBUTRIN XL TAKE 1 TABLET BY MOUTH EVERY DAY   chlorthalidone 25  MG tablet Commonly known as: HYGROTON TAKE 1/2 TO 1 TABLET BY MOUTH DAILY   clonazePAM 0.5 MG tablet Commonly known as: KLONOPIN TAKE 1 TABLET BY MOUTH TWICE DAILY AS NEEDED FOR ANXIETY   CO Q 10 PO Take 1 capsule by mouth daily.   DAMIANA LEAF PO Take 700 mg by mouth daily.   diltiazem 180 MG 24 hr capsule Commonly known as: CARDIZEM CD TAKE 1 CAPSULE BY MOUTH ONCE DAILY   EPINEPHrine 0.3 mg/0.3 mL Soaj injection Commonly known as: EpiPen 2-Pak USE AS NEEDED   Fish Oil 1000 MG Caps Take 1 capsule by mouth 2 (two) times daily.   gabapentin 300 MG capsule Commonly known as: NEURONTIN TAKE 2 CAPSULES BY MOUTH AT BEDTIME.   HYDROcodone-acetaminophen 5-325 MG tablet Commonly known as: NORCO/VICODIN Take 0.5-1 tablets by mouth 2 (two) times daily as needed (No more than 1-3x a week use.).   MAGNESIUM PO Take 1 capsule by mouth daily.   NEEDLE (DISP) 23 G 23G X 1" Misc Inject once a week   NON FORMULARY Beufomax   potassium chloride 10 MEQ tablet Commonly known as: KLOR-CON TAKE 2 TABLETS BY MOUTH TWO TIMES DAILY   SAW PALMETTO PO Take 1 capsule by mouth 2 (two) times daily.   sildenafil 20 MG tablet Commonly known as: REVATIO Take 1-5 tablets as needed once every 48 hours for erectile dysfunction   testosterone cypionate 200 MG/ML injection Commonly known as: DEPOTESTOSTERONE CYPIONATE INJECT 0.3 ML WEEKLY   Testosterone Enanthate 200 MG/ML Soln Inject 40 mg as directed once a week. Inject 0.38mLs (40mg  total) intramuscularly once weekly.   Vitamin D 50 MCG (2000 UT) Caps Take 1 capsule by mouth daily.   Zinc 15 MG Caps Take by mouth.       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   Cataract    forming   Depressive disorder, not elsewhere classified    Esophageal reflux    past hx    HTN (hypertension)    Lymphocytic colitis    Neuromuscular disorder (HCC)    neuropathy   Neuropathy    Other testicular hypofunction    Palpitations    Personal history of urinary calculi     Past Surgical History:  Procedure Laterality Date   COLONOSCOPY     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  6-11   [name]   parotid tumor surgery     TONSILLECTOMY AND ADENOIDECTOMY      Family History  Problem Relation Age of Onset   Hypertension Mother    Anxiety disorder Mother    Non-Hodgkin's lymphoma Mother    Dementia Father    Depression Father        and anxiety   Hypertension Father    Sudden death Paternal Uncle 69   Colon cancer Paternal Uncle    Hypertension Brother    Early death Neg Hx    Heart disease Neg Hx        mother in 63s had lesoin   Colon polyps Neg Hx    Esophageal cancer Neg Hx    Rectal cancer Neg Hx    Stomach cancer Neg Hx     Social History:  reports that he has never smoked. He has never used smokeless tobacco. He reports current alcohol use. He reports that he does not use drugs.  Review of Systems  IMPAIRED fasting glucose:   Although his A1c is normal usually his fasting glucose is consistently high  Fasting glucose on his glucose tolerance test done on 03/31/18 was 105 but 2-hour reading was only 103  His fasting glucose has not been checked again, nonfasting glucose 141  He has apparently lost a little weight He is usually fairly consistent with eating healthy meals He does fairly regular activities like gardening and exercise bike  Wt Readings from Last 3 Encounters:  11/01/19 181 lb (82.1 kg)  06/05/19 184 lb 12.8 oz (83.8 kg)  02/05/19 186 lb (84.4 kg)   Lab Results  Component Value Date   GLUCOSE 141 (H)  11/28/2019   GLUCOSE 105 (H) 11/01/2019   GLUCOSE 119 (H) 08/21/2019   Lab Results  Component Value Date   HGBA1C 5.5 11/28/2019   HGBA1C 5.2 02/05/2019   HGBA1C 5.4 12/27/2017   Lab Results  Component Value Date   LDLCALC 89 02/05/2019   CREATININE 1.13 11/28/2019    General Examination:   There were no vitals taken for this visit.     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  He is on treatment with Depo-Testosterone once a week, continues to be on a stable dose of taking 30 mg weekly  Subjectively has been doing well with no unusual fatigue or recurrence of original symptoms However he still has some decreased libido  Testosterone level is again quite normal now at 478, likely this is appropriate for his age Hemoglobin is still upper normal at 16.4 but stable  IMPAIRED fasting glucose with normal A1c  Nonfasting glucose 141 and will need to check fasting glucose on the next visit With his A1c being normal he does not need to be on Metformin, previously glucose tolerance test normal  LIPIDS: His PCP will be following up on his lipids, currently on Lipitor with LDL below 100  Plan: He will continue 30 mg testosterone injections weekly using the 200 mg/mL preparation Other plans as above  Follow-up in 6 months  Wylodean Shimmel 12/12/2019, 9:49 AM     Note: This office note was prepared with Dragon voice recognition system technology. Any transcriptional errors that result from this process are unintentional.

## 2019-12-21 ENCOUNTER — Other Ambulatory Visit: Payer: Self-pay | Admitting: Family Medicine

## 2019-12-21 ENCOUNTER — Encounter: Payer: Self-pay | Admitting: Family Medicine

## 2019-12-22 ENCOUNTER — Other Ambulatory Visit: Payer: Self-pay | Admitting: Family Medicine

## 2019-12-22 MED FILL — CHLORTHALIDONE 25 MG TABS: 25 | 90 days supply | Qty: 90 | Fill #0

## 2019-12-25 MED FILL — GABAPENTIN 300 MG CAPSULE: 300 | 90 days supply | Qty: 180 | Fill #0

## 2019-12-27 ENCOUNTER — Encounter: Payer: Self-pay | Admitting: Family Medicine

## 2019-12-29 MED FILL — POTASSIUM CHLORIDE CRYS ER: 10 | 30 days supply | Qty: 120 | Fill #3

## 2019-12-29 MED FILL — clonazePAM 0.5 MG TABS: 0.5 | 30 days supply | Qty: 60 | Fill #1

## 2019-12-29 MED FILL — buPROPion HCL ER (XL) 300 M: 300 | 30 days supply | Qty: 30 | Fill #2

## 2020-01-25 MED FILL — buPROPion HCL ER (XL) 300 M: 300 | 30 days supply | Qty: 30 | Fill #3

## 2020-01-25 MED FILL — clonazePAM 0.5 MG TABS: 0.5 | 30 days supply | Qty: 60 | Fill #2

## 2020-01-26 ENCOUNTER — Other Ambulatory Visit: Payer: Self-pay | Admitting: Family Medicine

## 2020-01-27 ENCOUNTER — Other Ambulatory Visit: Payer: Self-pay | Admitting: Family Medicine

## 2020-01-28 MED FILL — POTASSIUM CHLORIDE CRYS ER: 10 | 30 days supply | Qty: 120 | Fill #0

## 2020-01-29 MED FILL — ATORVASTATIN CALCIUM 10 MG: 10 | 90 days supply | Qty: 90 | Fill #1

## 2020-01-30 ENCOUNTER — Other Ambulatory Visit: Payer: Self-pay | Admitting: Neurology

## 2020-01-30 MED ORDER — AMOXICILLIN-POT CLAVULANATE 875-125 MG PO TABS: 1.0000 | ORAL_TABLET | Freq: Two times a day (BID) | ORAL | 0 refills | Status: DC

## 2020-01-30 MED FILL — AMOX-CLAV 875-125 MG TABLET: 875-125 | 10 days supply | Qty: 20 | Fill #0

## 2020-02-05 MED FILL — DILTIAZEM HCL ER COATED BEA: 180 | 90 days supply | Qty: 90 | Fill #1

## 2020-02-06 DIAGNOSIS — H2513 Age-related nuclear cataract, bilateral: Secondary | ICD-10-CM | POA: Diagnosis not present

## 2020-02-06 DIAGNOSIS — H43813 Vitreous degeneration, bilateral: Secondary | ICD-10-CM | POA: Diagnosis not present

## 2020-02-06 DIAGNOSIS — H524 Presbyopia: Secondary | ICD-10-CM | POA: Diagnosis not present

## 2020-02-06 DIAGNOSIS — H25043 Posterior subcapsular polar age-related cataract, bilateral: Secondary | ICD-10-CM | POA: Diagnosis not present

## 2020-02-11 ENCOUNTER — Other Ambulatory Visit: Payer: Self-pay | Admitting: Endocrinology

## 2020-02-11 MED ORDER — TESTOSTERONE CYPIONATE 200 MG/ML IM SOLN
INTRAMUSCULAR | 2 refills | Status: DC
Start: 2020-02-11 — End: 2020-06-10

## 2020-02-12 ENCOUNTER — Other Ambulatory Visit: Payer: Self-pay | Admitting: Endocrinology

## 2020-02-12 MED FILL — TESTOSTERONE CYP 200 MG/ML: 200 | 21 days supply | Qty: 3 | Fill #0

## 2020-02-13 ENCOUNTER — Other Ambulatory Visit (HOSPITAL_COMMUNITY): Payer: Self-pay | Admitting: Endocrinology

## 2020-02-13 MED FILL — BD LUER-LOK SYRINGE 1 ML: 1 ML | 365 days supply | Qty: 100 | Fill #0

## 2020-02-13 NOTE — Progress Notes (Signed)
Phone: [phone]   Subjective:  Patient presents today for their annual physical. Chief complaint-noted.   See problem oriented charting- ROS- full  review of systems was completed and negative  except for: lower energy for last 2 weeks, appetite change worried about abdominal pain, weight loss, palpitations higher with stress, abdominal pain, loose stools, nausea, nocturia 3-4x a night causing insomnia, urinary urgency, joint pain, back pain, muscle aches, neck pain- stable issues, headaches for just a few days very mild and gone right now- thinks couldbe related to dry air, down mood, insomnia  The following were reviewed and entered/updated in epic: Past Medical History:  Diagnosis Date  . Allergic rhinitis, cause unspecified   . Anxiety   . Arthritis    OA  . Cancer ([place])    lung tumor   . 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 Cataract    forming  . Depressive disorder, not elsewhere classified   . Esophageal reflux    past hx   . HTN (hypertension)   . Lymphocytic colitis   . Neuromuscular disorder ([facility])    neuropathy  . Neuropathy   . Other testicular hypofunction   . Palpitations   . Personal history of urinary calculi    Patient Active Problem List   Diagnosis Date Noted  . Chronic pain syndrome 08/31/2016    Priority: High  . History of lung cancer 12/11/2012    Priority: High  . History of adenomatous polyp of colon 03/24/2018    Priority: Medium  . Generalized anxiety disorder 03/12/2014    Priority: Medium  . Essential hypertension 03/12/2014    Priority: Medium  . Peripheral neuropathy, idiopathic 12/22/2012    Priority: Medium  . Dyspnea 07/14/2012    Priority: Medium  . Hypertriglyceridemia 07/14/2012    Priority: Medium  . Depression 02/16/2010    Priority: Medium  . Low testosterone 11/21/2007    Priority: Medium  . Chronic bilateral low back pain 06/05/2019    Priority: Low  . Venous stasis of both lower extremities 06/05/2019     Priority: Low  . Mastoiditis 03/09/2017    Priority: Low  . Palpitations 08/11/2016    Priority: Low  . Eustachian tube dysfunction 12/17/2014    Priority: Low  . SI (sacroiliac) joint dysfunction 09/19/2013    Priority: Low  . Nonspecific abnormal electrocardiogram (ECG) (EKG) 07/14/2012    Priority: Low  . Allergic reaction, history of 10/25/2011    Priority: Low  . Rectal or anal pain 10/01/2010    Priority: Low  . Allergic rhinitis 06/13/2007    Priority: Low  . GERD 06/13/2007    Priority: Low  . IBS (irritable bowel syndrome) 06/13/2007    Priority: Low  . Nonallopathic lesion of sacral region 09/19/2013  . Nonallopathic lesion of thoracic region 09/19/2013  . Nonallopathic lesion of lumbosacral region 09/19/2013   Past Surgical History:  Procedure Laterality Date  . COLONOSCOPY    . 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 parotid tumor surgery    . TONSILLECTOMY AND ADENOIDECTOMY      Family History  Problem Relation Age of Onset  . Hypertension Mother   . Anxiety disorder Mother   . Non-Hodgkin's lymphoma Mother   . Dementia Father   . Depression Father        and anxiety  . Hypertension Father   . Sudden death Paternal Uncle 27  . Colon cancer Paternal Uncle   .  Hypertension Brother   . Early death Neg Hx   . Heart disease Neg Hx        mother in 56s had lesoin  . Colon polyps Neg Hx   . Esophageal cancer Neg Hx   . Rectal cancer Neg Hx   . Stomach cancer Neg Hx     Medications- reviewed and updated Current Outpatient Medications  Medication Sig Dispense Refill  . Acetylcarnitine HCl (ACETYL L-CARNITINE PO) Take by mouth.    Marland Kitchen atorvastatin (LIPITOR) 10 MG tablet Take 1 tablet (10 mg total) by mouth daily. 90 tablet 3  . b complex vitamins tablet Take 1 tablet by mouth daily.      . B-D 3CC LUER-LOK SYR 22GX1" 22G X 1" 3 ML MISC USE WITH TESTOSTERONE WEEKLY AS DIRECTED 100 each 3  . buPROPion (WELLBUTRIN  XL) 300 MG 24 hr tablet TAKE 1 TABLET BY MOUTH EVERY DAY 30 tablet 3  . chlorthalidone (HYGROTON) 25 MG tablet TAKE 1/2 TO 1 TABLET BY MOUTH DAILY 90 tablet 1  . Cholecalciferol (VITAMIN D) 2000 UNITS CAPS Take 1 capsule by mouth daily.     . clonazePAM (KLONOPIN) 0.5 MG tablet TAKE 1 TABLET BY MOUTH TWICE DAILY AS NEEDED FOR ANXIETY 60 tablet 2  . Damiana, [name], (DAMIANA LEAF PO) Take 700 mg by mouth daily.    Marland Kitchen diltiazem (CARDIZEM CD) 180 MG 24 hr capsule TAKE 1 CAPSULE BY MOUTH ONCE DAILY 90 capsule 3  . gabapentin (NEURONTIN) 300 MG capsule TAKE 2 CAPSULES BY MOUTH AT BEDTIME. 180 capsule 1  . HYDROcodone-acetaminophen (NORCO/VICODIN) 5-325 MG tablet Take 0.5-1 tablets by mouth 2 (two) times daily as needed (No more than 1-3x a week use.). 55 tablet 0  . MAGNESIUM PO Take 1 capsule by mouth daily.    Marland Kitchen NEEDLE, DISP, 23 G 23G X 1" MISC Inject once a week 25 each 0  . NON FORMULARY Beufomax    . potassium chloride (KLOR-CON) 10 MEQ tablet TAKE 2 TABLETS BY MOUTH TWO TIMES DAILY 120 tablet 3  . Saw Palmetto, Serenoa repens, (SAW PALMETTO PO) Take 1 capsule by mouth 2 (two) times daily.    . sildenafil (REVATIO) 20 MG tablet Take 1-5 tablets as needed once every 48 hours for erectile dysfunction 50 tablet 3  . testosterone cypionate (DEPOTESTOSTERONE CYPIONATE) 200 MG/ML injection INJECT 0.3 MLS WEEKLY 3 mL 2  . Zinc 15 MG CAPS Take by mouth.    . ALPHA-LIPOIC ACID PO Take by mouth. (Patient not taking: Reported on 11/01/2019)    . aspirin EC 81 MG tablet Take 81 mg by mouth daily. Three times a week    . Coenzyme Q10 (CO Q 10 PO) Take 1 capsule by mouth daily. (Patient not taking: Reported on 11/01/2019)    . EPINEPHrine 0.3 mg/0.3 mL IJ SOAJ injection Inject 0.3 mg into the muscle as needed for anaphylaxis. 2 each 1  . Omega-3 Fatty Acids (FISH OIL) 1000 MG CAPS Take 1 capsule by mouth 2 (two) times daily.  (Patient not taking: Reported on 11/01/2019)    . omeprazole (PRILOSEC) 40 MG  capsule Take 1 capsule (40 mg total) by mouth daily. 30 capsule 1   No current facility-administered medications for this visit.    Allergies-reviewed and updated Allergies  Allergen Reactions  . Bee Venom Anaphylaxis  . Flagyl [Metronidazole Hcl] Other (See Comments)    Neuropathy   . 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 Married 30 + years. No children, lots of critters.       Cancer Survivor- carcinoid lung cancer.   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 Martin Majestic back to work for ability to be insured.       Hobbies: music, home repair/improvement, cats   Objective  Objective:  BP 128/66   Pulse 79   Temp (!) 97.3 F (36.3 C) (Temporal)   Resp 18   Ht 5\' 8"  (1.727 m)   Wt 181 lb 3.2 oz (82.2 kg)   SpO2 97%   BMI 27.55 kg/m  Gen: NAD, resting comfortably HEENT: Mucous membranes are moist. Oropharynx normal Neck: no thyromegaly CV: RRR no murmurs rubs or gallops Lungs: CTAB no crackles, wheeze, rhonchi Abdomen: soft/mild pain in epigastric area/nondistended/normal bowel sounds. No rebound or guarding.  Ext: no edema Skin: warm, dry Neuro: grossly normal, moves all extremities, PERRLA    Assessment and Plan  69 y.o. male presenting for annual physical.  Health Maintenance counseling: 1. Anticipatory guidance: Patient counseled regarding regular dental exams -q6 months, eye exams - yearly,  avoiding smoking and second hand smoke , limiting alcohol to 2 beverages per day -typically 1 to 2/week.   2. Risk factor reduction:  Advised patient of need for regular exercise and diet rich and fruits and vegetables to reduce risk of heart attack and stroke. Exercise- until fatigue and abdominal pain issues was doing 5 days a week- riding bike or working in yard. Diet- down 5 lbs but concerned unintentional- is trying to  eat healthy diet mainly mediterranean.  Last year physical was 186. Wt Readings from Last 3 Encounters:  02/14/20 181 lb 3.2 oz (82.2 kg)  11/01/19 181 lb (82.1 kg)  06/05/19 184 lb 12.8 oz (83.8 kg)  3. Immunizations/screenings/ancillary studies-discussed flu shot- already had, COVID-19 booster and Shingrix- opts out due to symptoms from separate note  Immunization History  Administered Date(s) Administered  . Fluad Quad(high Dose 65+) 01/16/2019, 01/28/2020  . Influenza Whole 01/04/2012  . Influenza, High Dose Seasonal PF 12/21/2017  . Influenza,inj,Quad PF,6+ Mos 12/17/2014  . Influenza-Unspecified 01/17/2014, 12/28/2015, 01/01/2017  . PFIZER SARS-COV-2 Vaccination 04/19/2019, 05/14/2019, 12/31/2019  . Pneumococcal Conjugate-13 06/17/2015  . Pneumococcal Polysaccharide-23 09/07/2016  . Td 02/27/2001, 04/10/2011  . Zoster 08/24/2010  4. Prostate cancer screening- low risk prior PSA trend-continue to trend with labs Lab Results  Component Value Date   PSA 0.22 02/05/2019   PSA 0.23 10/13/2018   PSA 0.32 12/27/2017   5. Colon cancer screening - December 2019 history of adenomatous polyp of the colon with planned repeat December 2022 6. Skin cancer screening-sees dermatology yearly. advised regular sunscreen use. Denies worrisome, changing, or new skin lesions.  7.  Never smoker 8. STD screening - opts out as monogamous  Status of chronic or acute concerns   #Recent illness-was prescribed Augmentin by Dr. Jaynee Eagles for 10 days in late October. Was having dental pain and had pan scan with dentist and started on augmentin- later had recurrence as above with Dr. Jaynee Eagles  #History of lung cancer/carcinoid tumor actually-we follow an annual chest x-ray on him.  Last time March 2021- see separate note but CT will evaluate this   #Chronic pain syndrome/neuropathy S: Medication: Gabapentin 600 mg at bedtime, sparing hydrocodone about twice or three times a week.  He knows to space hydrocodone  and clonazepam at least  12 hours  -Does stationary bike for exercise as far to be on his feet for prolonged periods- other than recently with feeling poorly  A/P: pain reasonably controlled- refill medication  -PDMP reviewed and low risk profile-testosterone 3 endocrinology and clonazepam and hydrocodone through me.  Last hydrocodone No. 55 November 01, 2019 -UDS last updated June 05, 2019  -Controlled substance contract on file from 07/08/2017  #hypertension/hypokalemia requiring potassium 20 mEq twice daily S: medication: Chlorthalidone 25 mg, diltiazem 180 mg extended release (amlodipine caused edema in past).   Home readings #s: reasonable control BP Readings from Last 3 Encounters:  02/14/20 128/66  11/01/19 (!) 132/68  06/05/19 (!) 144/62  A/P: Stable. Continue current medications.   #Hypertriglyceridemia- see separate note. Had microvascular ischemia on prior MRI. Stopped taking aspirin.    # Depression/GAD S: Medication:Wellbutrin 300 mg extended release.  Uses clonazepam 1-2 times a day-we have discussed trying to cut back.  Has not tolerated SSRIs due to palpitations Depression screen [facility] 2/9 02/14/2020 11/01/2019 06/05/2019  Decreased Interest 1 0 1  Down, Depressed, Hopeless 3 1 0  PHQ - 2 Score 4 1 1   Altered sleeping 2 1 3   Tired, decreased energy 3 1 0  Change in appetite 0 3 2  Feeling bad or failure about yourself  0 1 0  Trouble concentrating 1 1 1   Moving slowly or fidgety/restless 1 1 0  Suicidal thoughts 0 0 0  PHQ-9 Score 11 9 7   Difficult doing work/chores Somewhat difficult Not difficult at all Not difficult at all  Some recent data might be hidden  A/P: patient feels that symptom worsening is related to recent concerns about overall health- he would like to hold off on changing meds- continue current meds- list partial remission.   #Hypogonadism-on testosterone treatment with Dr. Dwyane Dee   Recommended follow up: Return in about 4 months (around 06/13/2020) for  follow up- or sooner if needed (if GI symptoms not improving).  Lab/Order associations:non fasting   ICD-10-CM   1. Preventative health care  Z00.00 CBC With Differential/Platelet    COMPLETE METABOLIC PANEL WITH GFR    Lipid Panel w/reflex Direct LDL    PSA  2. Hypertriglyceridemia  E78.1 CBC With Differential/Platelet    COMPLETE METABOLIC PANEL WITH GFR    Lipid Panel w/reflex Direct LDL  3. Screening for prostate cancer  Z12.5 PSA  4. Essential hypertension  I10   5. History of malignant carcinoid tumor  Z85.9 CT CHEST ABDOMEN PELVIS W CONTRAST  6. Chronic bilateral low back pain without sciatica  M54.50 CT CHEST ABDOMEN PELVIS W CONTRAST   G89.29   7. Epigastric abdominal pain  R10.13 CT CHEST ABDOMEN PELVIS W CONTRAST  8. Left lower quadrant abdominal pain  R10.32 CT CHEST ABDOMEN PELVIS W CONTRAST  9. Gastroesophageal reflux disease, unspecified whether esophagitis present  K21.9     Meds ordered this encounter  Medications  . EPINEPHrine 0.3 mg/0.3 mL IJ SOAJ injection    Sig: Inject 0.3 mg into the muscle as needed for anaphylaxis.    Dispense:  2 each    Refill:  1  . HYDROcodone-acetaminophen (NORCO/VICODIN) 5-325 MG tablet    Sig: Take 0.5-1 tablets by mouth 2 (two) times daily as needed (No more than 1-3x a week use.).    Dispense:  55 tablet    Refill:  0  . omeprazole (PRILOSEC) 40 MG capsule    Sig: Take 1 capsule (40 mg total) by mouth daily.  Dispense:  30 capsule    Refill:  1    Return precautions advised.  [name], MD

## 2020-02-14 ENCOUNTER — Other Ambulatory Visit: Payer: Self-pay

## 2020-02-14 ENCOUNTER — Ambulatory Visit (INDEPENDENT_AMBULATORY_CARE_PROVIDER_SITE_OTHER): Payer: 59 | Admitting: Family Medicine

## 2020-02-14 ENCOUNTER — Encounter: Payer: Self-pay | Admitting: Family Medicine

## 2020-02-14 VITALS — BP 128/66 | HR 79 | Temp 97.3°F | Resp 18 | Ht 68.0 in | Wt 181.2 lb

## 2020-02-14 DIAGNOSIS — Z125 Encounter for screening for malignant neoplasm of prostate: Secondary | ICD-10-CM

## 2020-02-14 DIAGNOSIS — M545 Low back pain, unspecified: Secondary | ICD-10-CM

## 2020-02-14 DIAGNOSIS — E781 Pure hyperglyceridemia: Secondary | ICD-10-CM

## 2020-02-14 DIAGNOSIS — Z859 Personal history of malignant neoplasm, unspecified: Secondary | ICD-10-CM

## 2020-02-14 DIAGNOSIS — R351 Nocturia: Secondary | ICD-10-CM

## 2020-02-14 DIAGNOSIS — R1013 Epigastric pain: Secondary | ICD-10-CM | POA: Diagnosis not present

## 2020-02-14 DIAGNOSIS — R195 Other fecal abnormalities: Secondary | ICD-10-CM

## 2020-02-14 DIAGNOSIS — Z0000 Encounter for general adult medical examination without abnormal findings: Secondary | ICD-10-CM

## 2020-02-14 DIAGNOSIS — G8929 Other chronic pain: Secondary | ICD-10-CM

## 2020-02-14 DIAGNOSIS — R1032 Left lower quadrant pain: Secondary | ICD-10-CM | POA: Diagnosis not present

## 2020-02-14 DIAGNOSIS — I10 Essential (primary) hypertension: Secondary | ICD-10-CM | POA: Diagnosis not present

## 2020-02-14 DIAGNOSIS — K219 Gastro-esophageal reflux disease without esophagitis: Secondary | ICD-10-CM

## 2020-02-14 LAB — POC URINALSYSI DIPSTICK (AUTOMATED)
Bilirubin, UA: NEGATIVE
Blood, UA: NEGATIVE
Glucose, UA: NEGATIVE
Ketones, UA: NEGATIVE
Leukocytes, UA: NEGATIVE
Nitrite, UA: NEGATIVE
Protein, UA: NEGATIVE
Spec Grav, UA: 1.015 (ref 1.010–1.025)
Urobilinogen, UA: 0.2 E.U./dL
pH, UA: 6 (ref 5.0–8.0)

## 2020-02-14 MED ORDER — HYDROCODONE-ACETAMINOPHEN 5-325 MG PO TABS
0.5000 | ORAL_TABLET | Freq: Two times a day (BID) | ORAL | 0 refills | Status: DC | PRN
Start: 2020-02-14 — End: 2020-06-17

## 2020-02-14 MED ORDER — OMEPRAZOLE 40 MG PO CPDR: 40.0000 mg | DELAYED_RELEASE_CAPSULE | Freq: Every day | ORAL | 1 refills | Status: DC

## 2020-02-14 MED ORDER — EPINEPHRINE 0.3 MG/0.3ML IJ SOAJ: 0.3000 mg | INTRAMUSCULAR | 1 refills | Status: DC | PRN

## 2020-02-14 MED FILL — HYDROCODON-APAP 5-325: 5-325 | 64 days supply | Qty: 55 | Fill #0

## 2020-02-14 MED FILL — OMEPRAZOLE 40 MG CPDR: 40 | 30 days supply | Qty: 30 | Fill #0

## 2020-02-14 MED FILL — EPINEPHRINE 0.3 MG AUTO-INJ: 0.3 | 30 days supply | Qty: 2 | Fill #0

## 2020-02-14 NOTE — Patient Instructions (Addendum)
Please stop by lab before you go If you have mychart- we will send your results within 3 business days of [place] receiving them.  If you do not have mychart- we will call you about results within 5 business days of [place] receiving them.  *please note we are currently using [facility] which has a longer processing time than [place] typically so labs may not come back as quickly as in the past *please also note that you will see labs on mychart as soon as they post. I will later go in and write notes on them- will say "notes from Dr. Yong Channel"  We will call you within two weeks about your referral to CT chest/abd/pelvis. If you do not hear within 3 weeks, give [place] a call.  -team since I ordered with [place] please check with lisa but I think we have to give the contrast to him   Try omeprazole $RemoveBefore'40mg'[id number]$  for 1-2 months  Team please give him kit for stool cards  May increase cholesterol medicine depending on results of labs  Recommended follow up: Return in about 4 months (around 06/13/2020) for follow up- or sooner if needed (if GI symptoms not improving).

## 2020-02-14 NOTE — Progress Notes (Signed)
Phone [phone] In person visit   Subjective:   [name] Page is a 69 y.o. year old very pleasant male patient who presents for/with See problem oriented charting This visit occurred during the SARS-CoV-2 public health emergency.  Safety protocols were in place, including screening questions prior to the visit, additional usage of staff PPE, and extensive cleaning of exam room while observing appropriate contact time as indicated for disinfecting solutions.   Past Medical History-  Patient Active Problem List   Diagnosis Date Noted  . Chronic pain syndrome 08/31/2016    Priority: High  . History of lung cancer 12/11/2012    Priority: High  . History of adenomatous polyp of colon 03/24/2018    Priority: Medium  . Generalized anxiety disorder 03/12/2014    Priority: Medium  . Essential hypertension 03/12/2014    Priority: Medium  . Peripheral neuropathy, idiopathic 12/22/2012    Priority: Medium  . Dyspnea 07/14/2012    Priority: Medium  . Hypertriglyceridemia 07/14/2012    Priority: Medium  . Depression 02/16/2010    Priority: Medium  . Low testosterone 11/21/2007    Priority: Medium  . Chronic bilateral low back pain 06/05/2019    Priority: Low  . Venous stasis of both lower extremities 06/05/2019    Priority: Low  . Mastoiditis 03/09/2017    Priority: Low  . Palpitations 08/11/2016    Priority: Low  . Eustachian tube dysfunction 12/17/2014    Priority: Low  . SI (sacroiliac) joint dysfunction 09/19/2013    Priority: Low  . Nonspecific abnormal electrocardiogram (ECG) (EKG) 07/14/2012    Priority: Low  . Allergic reaction, history of 10/25/2011    Priority: Low  . Rectal or anal pain 10/01/2010    Priority: Low  . Allergic rhinitis 06/13/2007    Priority: Low  . GERD 06/13/2007    Priority: Low  . IBS (irritable bowel syndrome) 06/13/2007    Priority: Low  . Nonallopathic lesion of sacral region 09/19/2013  . Nonallopathic lesion of thoracic region  09/19/2013  . Nonallopathic lesion of lumbosacral region 09/19/2013    Medications- reviewed and updated Current Outpatient Medications  Medication Sig Dispense Refill  . Acetylcarnitine HCl (ACETYL L-CARNITINE PO) Take by mouth.    Marland Kitchen atorvastatin (LIPITOR) 10 MG tablet Take 1 tablet (10 mg total) by mouth daily. 90 tablet 3  . b complex vitamins tablet Take 1 tablet by mouth daily.      . B-D 3CC LUER-LOK SYR 22GX1" 22G X 1" 3 ML MISC USE WITH TESTOSTERONE WEEKLY AS DIRECTED 100 each 3  . buPROPion (WELLBUTRIN XL) 300 MG 24 hr tablet TAKE 1 TABLET BY MOUTH EVERY DAY 30 tablet 3  . chlorthalidone (HYGROTON) 25 MG tablet TAKE 1/2 TO 1 TABLET BY MOUTH DAILY 90 tablet 1  . Cholecalciferol (VITAMIN D) 2000 UNITS CAPS Take 1 capsule by mouth daily.     . clonazePAM (KLONOPIN) 0.5 MG tablet TAKE 1 TABLET BY MOUTH TWICE DAILY AS NEEDED FOR ANXIETY 60 tablet 2  . Damiana, [name], (DAMIANA LEAF PO) Take 700 mg by mouth daily.    Marland Kitchen diltiazem (CARDIZEM CD) 180 MG 24 hr capsule TAKE 1 CAPSULE BY MOUTH ONCE DAILY 90 capsule 3  . gabapentin (NEURONTIN) 300 MG capsule TAKE 2 CAPSULES BY MOUTH AT BEDTIME. 180 capsule 1  . HYDROcodone-acetaminophen (NORCO/VICODIN) 5-325 MG tablet Take 0.5-1 tablets by mouth 2 (two) times daily as needed (No more than 1-3x a week use.). 55 tablet 0  . MAGNESIUM PO  Take 1 capsule by mouth daily.    Marland Kitchen NEEDLE, DISP, 23 G 23G X 1" MISC Inject once a week 25 each 0  . NON FORMULARY Beufomax    . potassium chloride (KLOR-CON) 10 MEQ tablet TAKE 2 TABLETS BY MOUTH TWO TIMES DAILY 120 tablet 3  . Saw Palmetto, Serenoa repens, (SAW PALMETTO PO) Take 1 capsule by mouth 2 (two) times daily.    . sildenafil (REVATIO) 20 MG tablet Take 1-5 tablets as needed once every 48 hours for erectile dysfunction 50 tablet 3  . testosterone cypionate (DEPOTESTOSTERONE CYPIONATE) 200 MG/ML injection INJECT 0.3 MLS WEEKLY 3 mL 2  . Zinc 15 MG CAPS Take by mouth.    . ALPHA-LIPOIC ACID PO  Take by mouth. (Patient not taking: Reported on 11/01/2019)    . aspirin EC 81 MG tablet Take 81 mg by mouth daily. Three times a week    . Coenzyme Q10 (CO Q 10 PO) Take 1 capsule by mouth daily. (Patient not taking: Reported on 11/01/2019)    . EPINEPHrine 0.3 mg/0.3 mL IJ SOAJ injection Inject 0.3 mg into the muscle as needed for anaphylaxis. 2 each 1  . Omega-3 Fatty Acids (FISH OIL) 1000 MG CAPS Take 1 capsule by mouth 2 (two) times daily.  (Patient not taking: Reported on 11/01/2019)     No current facility-administered medications for this visit.     Objective:  BP 128/66   Pulse 79   Temp (!) 97.3 F (36.3 C) (Temporal)   Resp 18   Ht 5\' 8"  (1.727 m)   Wt 181 lb 3.2 oz (82.2 kg)   SpO2 97%   BMI 27.55 kg/m  Gen: NAD, resting comfortably Abdomen: soft/mild epigastric tenderness/nondistended/normal bowel sounds. No rebound or guarding.     Assessment and Plan   # abdominal pain S: about 6 weeks of symptoms. Usually starts after he eats. Started in left lower abdomen, sometimes periumbilical and sometimes epigastric. Started on pepcid 10 or 20mg  twice daily. Dropped 5 lbs recently. Has had some change in bowel movements. Loose stools but not full diarrhea- goes 4-5 x a day. A long time ago had lymphocytic colitis- saw Dr. Olevia Perches (was having lower abdominal pain- pepto bismol helped at that time). Has tried pepto bismol this time without relief.   Pain worse after meals and happens for several hours most days. When it was hurting in LLQ similar timing and duration. BMs do not improve symptoms. Moderate 6/10 pain with this  Nocturia 3-4 x a night with some increased frequency  Stool darker but on pepto bismol. No brbpr A/P: 69 year old male with history carcinoid tumor in lungs now with shifting abdominal pain and chronic back pain and with recent unintentional weight loss on home scales of 5 lbs. He is concerned about cancer recurrence- we opted to do CT chest/abd/pelvis. If this  is not approved could refer back to GI or oncology.  -possible GERD also try omeprazole 40 mg for 1-2 months in case GERD element though this would not explain shifting pain distribution -With urinary frequency we will also get a urinalysis today - also with dark stools pause pepto bismol for 1 week and then do stool cards  #hyperlipidemia S: Medication: atorvastatin 10mg    Lab Results  Component Value Date   CHOL 172 02/05/2019   HDL 47.40 02/05/2019   LDLCALC 89 02/05/2019   TRIG 178.0 (H) 02/05/2019   CHOLHDL 4 02/05/2019   A/P: he would like to consider  higher dose statin- update lipid panel and consider 20 mg dosing.   Recommended follow up: if symptoms fail to improve or concerns on scan   Lab/Order associations:   ICD-10-CM   5. History of malignant carcinoid tumor  Z85.9 CT CHEST ABDOMEN PELVIS W CONTRAST  6. Chronic bilateral low back pain without sciatica  M54.50 CT CHEST ABDOMEN PELVIS W CONTRAST   G89.29   7. Epigastric abdominal pain  R10.13 CT CHEST ABDOMEN PELVIS W CONTRAST  8. Left lower quadrant abdominal pain  R10.32 CT CHEST ABDOMEN PELVIS W CONTRAST  9. GERD 10. Nocturia/urinary frequency  Meds ordered this encounter  Medications  . omeprazole (PRILOSEC) 40 MG capsule    Sig: Take 1 capsule (40 mg total) by mouth daily.    Dispense:  30 capsule    Refill:  1   Return precautions advised.  [name], MD

## 2020-02-15 ENCOUNTER — Encounter: Payer: Self-pay | Admitting: Family Medicine

## 2020-02-15 LAB — COMPLETE METABOLIC PANEL WITH GFR
AG Ratio: 1.8 (calc) (ref 1.0–2.5)
ALT: 26 U/L (ref 9–46)
AST: 26 U/L (ref 10–35)
Albumin: 4.5 g/dL (ref 3.6–5.1)
Alkaline phosphatase (APISO): 81 U/L (ref 35–144)
BUN: 15 mg/dL (ref 7–25)
CO2: 32 mmol/L (ref 20–32)
Calcium: 10.2 mg/dL (ref 8.6–10.3)
Chloride: 99 mmol/L (ref 98–110)
Creat: 1.13 mg/dL (ref 0.70–1.25)
GFR, Est African American: 76 mL/min/{1.73_m2} (ref >=60)
GFR, Est Non African American: 66 mL/min/{1.73_m2} (ref >=60)
Globulin: 2.5 g/dL (calc) (ref 1.9–3.7)
Glucose, Bld: 75 mg/dL (ref 65–99)
Potassium: 3.7 mmol/L (ref 3.5–5.3)
Sodium: 139 mmol/L (ref 135–146)
Total Bilirubin: 1.3 mg/dL — ABNORMAL HIGH (ref 0.2–1.2)
Total Protein: 7 g/dL (ref 6.1–8.1)

## 2020-02-15 LAB — CBC WITH DIFFERENTIAL/PLATELET
Absolute Monocytes: 897 cells/uL (ref 200–950)
Basophils Absolute: 31 cells/uL (ref 0–200)
Basophils Relative: 0.4 %
Eosinophils Absolute: 133 cells/uL (ref 15–500)
Eosinophils Relative: 1.7 %
HCT: 49.8 % (ref 38.5–50.0)
Hemoglobin: 17.5 g/dL — ABNORMAL HIGH (ref 13.2–17.1)
Lymphs Abs: 1810 cells/uL (ref 850–3900)
MCH: 32.9 pg (ref 27.0–33.0)
MCHC: 35.1 g/dL (ref 32.0–36.0)
MCV: 93.6 fL (ref 80.0–100.0)
MPV: 9.5 fL (ref 7.5–12.5)
Monocytes Relative: 11.5 %
Neutro Abs: 4930 cells/uL (ref 1500–7800)
Neutrophils Relative %: 63.2 %
Platelets: 219 10*3/uL (ref 140–400)
RBC: 5.32 10*6/uL (ref 4.20–5.80)
RDW: 11.2 % (ref 11.0–15.0)
Total Lymphocyte: 23.2 %
WBC: 7.8 10*3/uL (ref 3.8–10.8)

## 2020-02-15 LAB — LIPID PANEL W/REFLEX DIRECT LDL
Cholesterol: 144 mg/dL (ref <200)
HDL: 57 mg/dL (ref >=40)
LDL Cholesterol (Calc): 69 mg/dL (calc)
Non-HDL Cholesterol (Calc): 87 mg/dL (calc) (ref <130)
Total CHOL/HDL Ratio: 2.5 (calc) (ref <5.0)
Triglycerides: 92 mg/dL (ref <150)

## 2020-02-15 LAB — PSA: PSA: 0.26 ng/mL (ref <=4.0)

## 2020-02-26 ENCOUNTER — Encounter: Payer: Self-pay | Admitting: Family Medicine

## 2020-02-27 ENCOUNTER — Other Ambulatory Visit: Payer: Self-pay | Admitting: Family Medicine

## 2020-02-27 MED FILL — POTASSIUM CHLORIDE CRYS ER: 10 | 30 days supply | Qty: 120 | Fill #1

## 2020-02-27 NOTE — Telephone Encounter (Signed)
buPROPion (WELLBUTRIN XL) 300 MG 24 hr tablet LR: 10-31-2019 Qty: 30 with 3 refills  Last office visit: 02-14-2020 Upcoming appointment: No pending appt    clonazePAM (KLONOPIN) 0.5 MG tablet LR: 11-30-2019  Qty: 60 with 2 refills  Last office visit: 02-14-2020 Upcoming appointment: No pending appt

## 2020-02-28 ENCOUNTER — Other Ambulatory Visit: Payer: Self-pay | Admitting: Family Medicine

## 2020-02-29 MED FILL — clonazePAM 0.5 MG TABS: 0.5 | 30 days supply | Qty: 60 | Fill #0

## 2020-02-29 MED FILL — buPROPion HCL ER (XL) 300 M: 300 | 30 days supply | Qty: 30 | Fill #0

## 2020-03-05 ENCOUNTER — Other Ambulatory Visit: Payer: Self-pay

## 2020-03-05 ENCOUNTER — Ambulatory Visit (INDEPENDENT_AMBULATORY_CARE_PROVIDER_SITE_OTHER)
Admission: RE | Admit: 2020-03-05 | Discharge: 2020-03-05 | Disposition: A | Discharge status: Home / Self Care | Payer: 59 | Source: Ambulatory Visit | Attending: Family Medicine | Admitting: Family Medicine

## 2020-03-05 DIAGNOSIS — M545 Low back pain, unspecified: Secondary | ICD-10-CM

## 2020-03-05 DIAGNOSIS — R1032 Left lower quadrant pain: Secondary | ICD-10-CM

## 2020-03-05 DIAGNOSIS — M2578 Osteophyte, vertebrae: Secondary | ICD-10-CM | POA: Diagnosis not present

## 2020-03-05 DIAGNOSIS — G8929 Other chronic pain: Secondary | ICD-10-CM

## 2020-03-05 DIAGNOSIS — M47816 Spondylosis without myelopathy or radiculopathy, lumbar region: Secondary | ICD-10-CM | POA: Diagnosis not present

## 2020-03-05 DIAGNOSIS — K4020 Bilateral inguinal hernia, without obstruction or gangrene, not specified as recurrent: Secondary | ICD-10-CM | POA: Diagnosis not present

## 2020-03-05 DIAGNOSIS — D1809 Hemangioma of other sites: Secondary | ICD-10-CM | POA: Diagnosis not present

## 2020-03-05 DIAGNOSIS — R1013 Epigastric pain: Secondary | ICD-10-CM | POA: Diagnosis not present

## 2020-03-05 DIAGNOSIS — Z859 Personal history of malignant neoplasm, unspecified: Secondary | ICD-10-CM

## 2020-03-05 MED ORDER — IOHEXOL 300 MG/ML  SOLN
100.0000 mL | Freq: Once | INTRAMUSCULAR | Status: AC | PRN
  Administered 2020-03-05: 100 mL via INTRAVENOUS

## 2020-03-06 ENCOUNTER — Encounter: Payer: Self-pay | Admitting: Family Medicine

## 2020-03-06 ENCOUNTER — Other Ambulatory Visit: Payer: Self-pay

## 2020-03-06 MED ORDER — ATORVASTATIN CALCIUM 40 MG PO TABS: 40.0000 mg | ORAL_TABLET | Freq: Every day | ORAL | 3 refills | Status: DC

## 2020-03-06 MED FILL — ATORVASTATIN 40 MG TABLET: 40 | 90 days supply | Qty: 90 | Fill #0

## 2020-03-19 MED FILL — CHLORTHALIDONE 25 MG TABS: 25 | 90 days supply | Qty: 90 | Fill #1

## 2020-03-19 MED FILL — GABAPENTIN 300 MG CAPSULE: 300 | 90 days supply | Qty: 180 | Fill #1

## 2020-03-24 ENCOUNTER — Telehealth: Payer: 59 | Admitting: Physician Assistant

## 2020-03-24 DIAGNOSIS — J0191 Acute recurrent sinusitis, unspecified: Secondary | ICD-10-CM

## 2020-03-24 MED ORDER — AMOXICILLIN-POT CLAVULANATE 875-125 MG PO TABS: 1.0000 | ORAL_TABLET | Freq: Two times a day (BID) | ORAL | 0 refills | Status: AC

## 2020-03-24 NOTE — Progress Notes (Signed)
We are sorry that you are not feeling well.  Here is how we plan to help!  Thanks for all the additional information.  It was very helpful in helping to choose the right course of action.  I would agree that you likely need a longer course or a different antibiotic.  I will write for Augmentin x 10 days to see if things will clear up.  I do think you should schedule a visit with ENT sooner rather than later for further evaluation of the recurrence.  The next go to would be doxycycline, but I know this causes GI upset.    Based on what you have shared with me it looks like you have sinusitis.  Sinusitis is inflammation and infection in the sinus cavities of the head.  Based on your presentation I believe you most likely have Acute Bacterial Sinusitis.  This is an infection caused by bacteria and is treated with antibiotics. I have prescribed Augmentin 875mg /125mg  one tablet twice daily with food, for 10 days. You may use an oral decongestant such as Mucinex D or if you have glaucoma or high blood pressure use plain Mucinex. Saline nasal spray help and can safely be used as often as needed for congestion.  If you develop worsening sinus pain, fever or notice severe headache and vision changes, or if symptoms are not better after completion of antibiotic, please schedule an appointment with a health care provider.    Sinus infections are not as easily transmitted as other respiratory infection, however we still recommend that you avoid close contact with loved ones, especially the very young and elderly.  Remember to wash your hands thoroughly throughout the day as this is the number one way to prevent the spread of infection!  Home Care:  Only take medications as instructed by your medical team.  Complete the entire course of an antibiotic.  Do not take these medications with alcohol.  A steam or ultrasonic humidifier can help congestion.  You can place a towel over your head and breathe in the steam  from hot water coming from a faucet.  Avoid close contacts especially the very young and the elderly.  Cover your mouth when you cough or sneeze.  Always remember to wash your hands.  Get Help Right Away If:  You develop worsening fever or sinus pain.  You develop a severe head ache or visual changes.  Your symptoms persist after you have completed your treatment plan.  Make sure you  Understand these instructions.  Will watch your condition.  Will get help right away if you are not doing well or get worse.  Your e-visit answers were reviewed by a board certified advanced clinical practitioner to complete your personal care plan.  Depending on the condition, your plan could have included both over the counter or prescription medications.  If there is a problem please reply  once you have received a response from your provider.  Your safety is important to [place].  If you have drug allergies check your prescription carefully.    You can use MyChart to ask questions about today's visit, request a non-urgent call back, or ask for a work or school excuse for 24 hours related to this e-Visit. If it has been greater than 24 hours you will need to follow up with your provider, or enter a new e-Visit to address those concerns.  You will get an e-mail in the next two days asking about your experience.  I hope  that your e-visit has been valuable and will speed your recovery. Thank you for using e-visits.   Greater than 5 minutes, yet less than 10 minutes of time have been spent researching, coordinating, and implementing care for this patient today

## 2020-03-25 MED FILL — POTASSIUM CHLORIDE CRYS ER: 10 | 30 days supply | Qty: 120 | Fill #2

## 2020-03-25 MED FILL — buPROPion HCL ER (XL) 300 M: 300 | 30 days supply | Qty: 30 | Fill #1

## 2020-04-01 MED FILL — clonazePAM 0.5 MG TABS: 0.5 | 30 days supply | Qty: 60 | Fill #1

## 2020-04-07 ENCOUNTER — Encounter: Payer: Self-pay | Admitting: Family Medicine

## 2020-04-07 ENCOUNTER — Telehealth (INDEPENDENT_AMBULATORY_CARE_PROVIDER_SITE_OTHER): Payer: 59 | Admitting: Family Medicine

## 2020-04-07 ENCOUNTER — Other Ambulatory Visit: Payer: Self-pay

## 2020-04-07 DIAGNOSIS — J0191 Acute recurrent sinusitis, unspecified: Secondary | ICD-10-CM

## 2020-04-07 DIAGNOSIS — R6884 Jaw pain: Secondary | ICD-10-CM | POA: Diagnosis not present

## 2020-04-07 DIAGNOSIS — Z8669 Personal history of other diseases of the nervous system and sense organs: Secondary | ICD-10-CM | POA: Diagnosis not present

## 2020-04-07 MED ORDER — AMOXICILLIN-POT CLAVULANATE 875-125 MG PO TABS: 1.0000 | ORAL_TABLET | Freq: Two times a day (BID) | ORAL | 0 refills | Status: DC

## 2020-04-07 NOTE — Progress Notes (Signed)
TELEPHONE ENCOUNTER   Patient verbally agreed to telephone visit and is aware that copayment and coinsurance may apply. Patient was treated using telemedicine according to accepted telemedicine protocols.  Location of the patient: home Location of provider: [facility], [place] of all persons participating in the telemedicine service and role in the encounter: Leamon Arnt, MD Reymundo Poll, CMA   Subjective  CC:  Chief Complaint  Patient presents with  . sinus congestion    Hx. mastoiditis , Lower jaw pain, seen by dentist - given Augmentin for sinus infection. Pain has returned multiple times after over 4 courses of Augmentin.     HPI: [name] Page is a 70 y.o. male who was telephoned today to address the problems listed above in the chief complaint.  Reviewed recent e-visit notes. Pt reports recurrent left low jaw, dental pain that radiates to upper teeth and left ear. Onset of sxs last summer: dentist dxd with sinus infection and pain resolved with augmentin. However, since, he has had recurrent sxs w/o new sxs every time he stops the augmentin. Hasn't seen ENT for several years; has h/o mastoiditis in 2018 found on MRI by neuro. No posterior ear pain or fever. No frontal headaches, PND. Takes intermittent hydrocodone for back pain; can't tolerate nsaids for pain.    ASSESSMENT: 1. Acute recurrent sinusitis, unspecified location   2. Jaw pain   3. History of mastoiditis      Atypical lower and upper dental/jaw pain:  Responsive to augmentin. Refilled but needs thorough ENt eval and imaging. Referred to Dr. Thornell Mule who has seen pt before.   Time spent with the patient (non face-to-face time during this virtual encounter): 23 minutes, spent in obtaining information about his symptoms, reviewing his previous labs, evaluations, and treatments, counseling him about his condition (please see the discussed topics above), and developing a plan to further  investigate it; the patient was provided an opportunity to ask questions and all were answered. The patient agreed with the plan and demonstrated an understanding of the instructions.   The patient was advised to call back or seek an in-person evaluation if the symptoms worsen or if the condition fails to improve as anticipated.  Follow up: prn  Visit date not found  Orders Placed This Encounter  Procedures  . Ambulatory referral to ENT   Meds ordered this encounter  Medications  . amoxicillin-clavulanate (AUGMENTIN) 875-125 MG tablet    Sig: Take 1 tablet by mouth 2 (two) times daily.    Dispense:  14 tablet    Refill:  0     I reviewed the patients updated PMH, FH, and SocHx.    Patient Active Problem List   Diagnosis Date Noted  . Chronic bilateral low back pain 06/05/2019  . Venous stasis of both lower extremities 06/05/2019  . History of adenomatous polyp of colon 03/24/2018  . Mastoiditis 03/09/2017  . Chronic pain syndrome 08/31/2016  . Palpitations 08/11/2016  . Eustachian tube dysfunction 12/17/2014  . Generalized anxiety disorder 03/12/2014  . Essential hypertension 03/12/2014  . SI (sacroiliac) joint dysfunction 09/19/2013  . Nonallopathic lesion of sacral region 09/19/2013  . Nonallopathic lesion of thoracic region 09/19/2013  . Nonallopathic lesion of lumbosacral region 09/19/2013  . Peripheral neuropathy, idiopathic 12/22/2012  . History of lung cancer 12/11/2012  . Dyspnea 07/14/2012  . Nonspecific abnormal electrocardiogram (ECG) (EKG) 07/14/2012  . Hypertriglyceridemia 07/14/2012  . Allergic reaction, history of 10/25/2011  . Rectal or  anal pain 10/01/2010  . Depression 02/16/2010  . Low testosterone 11/21/2007  . Allergic rhinitis 06/13/2007  . GERD 06/13/2007  . IBS (irritable bowel syndrome) 06/13/2007   Current Meds  Medication Sig  . Acetylcarnitine HCl (ACETYL L-CARNITINE PO) Take by mouth.  . ALPHA-LIPOIC ACID PO Take by mouth.  Marland Kitchen  amoxicillin-clavulanate (AUGMENTIN) 875-125 MG tablet Take 1 tablet by mouth 2 (two) times daily.  Marland Kitchen aspirin EC 81 MG tablet Take 81 mg by mouth daily. Three times a week  . atorvastatin (LIPITOR) 40 MG tablet Take 1 tablet (40 mg total) by mouth daily.  Marland Kitchen b complex vitamins tablet Take 1 tablet by mouth daily.  . B-D 3CC LUER-LOK SYR 22GX1" 22G X 1" 3 ML MISC USE WITH TESTOSTERONE WEEKLY AS DIRECTED  . buPROPion (WELLBUTRIN XL) 300 MG 24 hr tablet TAKE 1 TABLET BY MOUTH EVERY DAY  . chlorthalidone (HYGROTON) 25 MG tablet TAKE 1/2 TO 1 TABLET BY MOUTH DAILY  . Cholecalciferol (VITAMIN D) 2000 UNITS CAPS Take 1 capsule by mouth daily.  . clonazePAM (KLONOPIN) 0.5 MG tablet TAKE 1 TABLET BY MOUTH TWO TIMES DAILY AS NEEDED FOR ANXIETY  . Coenzyme Q10 (CO Q 10 PO) Take 1 capsule by mouth daily.  [name], [name], (DAMIANA LEAF PO) Take 700 mg by mouth daily.  Marland Kitchen diltiazem (CARDIZEM CD) 180 MG 24 hr capsule TAKE 1 CAPSULE BY MOUTH ONCE DAILY  . EPINEPHrine 0.3 mg/0.3 mL IJ SOAJ injection Inject 0.3 mg into the muscle as needed for anaphylaxis.  Marland Kitchen gabapentin (NEURONTIN) 300 MG capsule TAKE 2 CAPSULES BY MOUTH AT BEDTIME.  Marland Kitchen HYDROcodone-acetaminophen (NORCO/VICODIN) 5-325 MG tablet Take 0.5-1 tablets by mouth 2 (two) times daily as needed (No more than 1-3x a week use.).  Marland Kitchen MAGNESIUM PO Take 1 capsule by mouth daily.  Marland Kitchen NEEDLE, DISP, 23 G 23G X 1" MISC Inject once a week  . NON FORMULARY Beufomax  . Omega-3 Fatty Acids (FISH OIL) 1000 MG CAPS Take 1 capsule by mouth 2 (two) times daily.  Marland Kitchen omeprazole (PRILOSEC) 40 MG capsule Take 1 capsule (40 mg total) by mouth daily.  . potassium chloride (KLOR-CON) 10 MEQ tablet TAKE 2 TABLETS BY MOUTH TWO TIMES DAILY  . Saw Palmetto, Serenoa repens, (SAW PALMETTO PO) Take 1 capsule by mouth 2 (two) times daily.  . sildenafil (REVATIO) 20 MG tablet Take 1-5 tablets as needed once every 48 hours for erectile dysfunction  . testosterone cypionate  (DEPOTESTOSTERONE CYPIONATE) 200 MG/ML injection INJECT 0.3 MLS WEEKLY  . Zinc 15 MG CAPS Take by mouth.    Allergies: Patient is allergic to bee venom, flagyl [metronidazole hcl], and nsaids. Family History: Patient family history includes Anxiety disorder in his mother; Colon cancer in his paternal uncle; Dementia in his father; Depression in his father; Hypertension in his brother, father, and mother; Non-Hodgkin's lymphoma in his mother; Sudden death (age of onset: 12) in his paternal uncle. Social History:  Patient  reports that he has never smoked. He has never used smokeless tobacco. He reports current alcohol use. He reports that he does not use drugs.  Review of Systems: Constitutional: Negative for fever malaise or anorexia Cardiovascular: negative for chest pain Respiratory: negative for SOB or persistent cough Gastrointestinal: negative for abdominal pain     [id number] physician/qualified health professional telephone evaluation 5 to 10 minutes [id number] physician/qualified help functional [place] evaluation for 11 to 20 minutes [id number] physician/qualify he will professional telephone evaluation for 21 to 30 minutes

## 2020-04-10 ENCOUNTER — Encounter: Payer: Self-pay | Admitting: Family Medicine

## 2020-04-11 ENCOUNTER — Encounter: Payer: Self-pay | Admitting: Family Medicine

## 2020-04-25 DIAGNOSIS — H6982 Other specified disorders of Eustachian tube, left ear: Secondary | ICD-10-CM | POA: Diagnosis not present

## 2020-04-25 DIAGNOSIS — R519 Headache, unspecified: Secondary | ICD-10-CM | POA: Diagnosis not present

## 2020-04-29 ENCOUNTER — Other Ambulatory Visit: Payer: Self-pay | Admitting: Otolaryngology

## 2020-04-29 ENCOUNTER — Other Ambulatory Visit (HOSPITAL_COMMUNITY): Payer: Self-pay | Admitting: Otolaryngology

## 2020-04-29 DIAGNOSIS — R519 Headache, unspecified: Secondary | ICD-10-CM

## 2020-04-30 ENCOUNTER — Other Ambulatory Visit: Payer: Self-pay | Admitting: Otolaryngology

## 2020-04-30 DIAGNOSIS — R519 Headache, unspecified: Secondary | ICD-10-CM

## 2020-05-02 ENCOUNTER — Encounter (HOSPITAL_COMMUNITY): Payer: Self-pay

## 2020-05-02 ENCOUNTER — Ambulatory Visit (HOSPITAL_COMMUNITY): Payer: 59

## 2020-05-02 ENCOUNTER — Ambulatory Visit (HOSPITAL_COMMUNITY)
Admission: RE | Admit: 2020-05-02 | Discharge: 2020-05-02 | Disposition: A | Discharge status: Home / Self Care | Payer: 59 | Source: Ambulatory Visit | Attending: Otolaryngology | Admitting: Otolaryngology

## 2020-05-02 ENCOUNTER — Other Ambulatory Visit: Payer: Self-pay

## 2020-05-02 DIAGNOSIS — H748X2 Other specified disorders of left middle ear and mastoid: Secondary | ICD-10-CM | POA: Diagnosis not present

## 2020-05-02 DIAGNOSIS — J320 Chronic maxillary sinusitis: Secondary | ICD-10-CM | POA: Diagnosis not present

## 2020-05-02 DIAGNOSIS — R519 Headache, unspecified: Secondary | ICD-10-CM | POA: Insufficient documentation

## 2020-05-02 DIAGNOSIS — Z86018 Personal history of other benign neoplasm: Secondary | ICD-10-CM | POA: Diagnosis not present

## 2020-05-02 DIAGNOSIS — J3489 Other specified disorders of nose and nasal sinuses: Secondary | ICD-10-CM | POA: Diagnosis not present

## 2020-05-06 MED FILL — DILTIAZEM HCL ER COATED BEA: 180 | 90 days supply | Qty: 90 | Fill #2

## 2020-05-06 MED FILL — TESTOSTERONE CYP 200 MG/ML: 200 | 21 days supply | Qty: 3 | Fill #1

## 2020-05-06 MED FILL — clonazePAM 0.5 MG TABS: 0.5 | 30 days supply | Qty: 60 | Fill #2

## 2020-05-13 ENCOUNTER — Other Ambulatory Visit: Payer: 59

## 2020-05-21 ENCOUNTER — Other Ambulatory Visit: Payer: Self-pay | Admitting: Family Medicine

## 2020-05-21 MED FILL — POTASSIUM CHLORIDE CRYS ER: 10 | 30 days supply | Qty: 120 | Fill #0

## 2020-05-21 MED FILL — buPROPion HCL ER (XL) 300 M: 300 | 30 days supply | Qty: 30 | Fill #3

## 2020-06-04 ENCOUNTER — Encounter: Payer: Self-pay | Admitting: Family Medicine

## 2020-06-05 NOTE — Telephone Encounter (Signed)
Patient has been scheduled for 06/17/20. Patient also wanted to make sure his medications were sent in the [facility].

## 2020-06-10 ENCOUNTER — Other Ambulatory Visit: Payer: Self-pay | Admitting: Endocrinology

## 2020-06-10 ENCOUNTER — Encounter: Payer: Self-pay | Admitting: Family Medicine

## 2020-06-10 ENCOUNTER — Other Ambulatory Visit: Payer: Self-pay

## 2020-06-10 MED ORDER — ATORVASTATIN CALCIUM 40 MG PO TABS: 40.0000 mg | ORAL_TABLET | Freq: Every day | ORAL | 3 refills | Status: DC

## 2020-06-10 MED ORDER — OMEPRAZOLE 40 MG PO CPDR: 40.0000 mg | DELAYED_RELEASE_CAPSULE | Freq: Every day | ORAL | 3 refills | Status: DC

## 2020-06-10 MED ORDER — POTASSIUM CHLORIDE CRYS ER 10 MEQ PO TBCR: EXTENDED_RELEASE_TABLET | ORAL | 3 refills | Status: DC

## 2020-06-10 MED ORDER — CHLORTHALIDONE 25 MG PO TABS: 12.5000 mg | ORAL_TABLET | Freq: Every day | ORAL | 3 refills | Status: DC

## 2020-06-10 MED ORDER — GABAPENTIN 300 MG PO CAPS: 600.0000 mg | ORAL_CAPSULE | Freq: Every day | ORAL | 3 refills | Status: DC

## 2020-06-10 MED ORDER — DILTIAZEM HCL ER COATED BEADS 180 MG PO CP24: 180.0000 mg | ORAL_CAPSULE | Freq: Every day | ORAL | 3 refills | Status: DC

## 2020-06-10 MED ORDER — BUPROPION HCL ER (XL) 300 MG PO TB24: 300.0000 mg | ORAL_TABLET | Freq: Every day | ORAL | 3 refills | Status: DC

## 2020-06-10 MED ORDER — TESTOSTERONE CYPIONATE 200 MG/ML IM SOLN: INTRAMUSCULAR | 2 refills | Status: DC

## 2020-06-10 MED ORDER — SILDENAFIL CITRATE 20 MG PO TABS: ORAL_TABLET | ORAL | 3 refills | Status: DC

## 2020-06-10 MED ORDER — "BD LUER-LOK SYRINGE 22G X 1"" 3 ML MISC": 3 refills | Status: DC

## 2020-06-16 NOTE — Patient Instructions (Addendum)
Get Tdap at pharmacy- will be cheaper  Please check with your pharmacy to see if they have the shingrix vaccine. If they do- please get this immunization and update [place] by phone call or mychart with dates you receive the vaccine  Could also consider prevnar 20  Please stop by lab before you go If you have mychart- we will send your results within 3 business days of [place] receiving them.  If you do not have mychart- we will call you about results within 5 business days of [place] receiving them.  *please also note that you will see labs on mychart as soon as they post. I will later go in and write notes on them- will say "notes from Dr. Yong Channel"   Recommended follow up: can schedule physical at next available if you would like or find a time sometime within next 6-9 months at latest for this. Best to go ahead schedule.

## 2020-06-16 NOTE — Progress Notes (Signed)
Phone [phone] In person visit   Subjective:   [name] Page is a 70 y.o. year old very pleasant male patient who presents for/with See problem oriented charting Chief Complaint  Patient presents with  . Hypertension    Blood pressures at home have been 128/70 to 130/70  . Pain    Peripheral Neuropathy.  . Depression   This visit occurred during the SARS-CoV-2 public health emergency.  Safety protocols were in place, including screening questions prior to the visit, additional usage of staff PPE, and extensive cleaning of exam room while observing appropriate contact time as indicated for disinfecting solutions.   Past Medical History-  Patient Active Problem List   Diagnosis Date Noted  . Chronic pain syndrome 08/31/2016    Priority: High  . History of lung cancer 12/11/2012    Priority: High  . History of adenomatous polyp of colon 03/24/2018    Priority: Medium  . Generalized anxiety disorder 03/12/2014    Priority: Medium  . Essential hypertension 03/12/2014    Priority: Medium  . Peripheral neuropathy, idiopathic 12/22/2012    Priority: Medium  . Dyspnea 07/14/2012    Priority: Medium  . Hypertriglyceridemia 07/14/2012    Priority: Medium  . Depression 02/16/2010    Priority: Medium  . Low testosterone 11/21/2007    Priority: Medium  . Chronic bilateral low back pain 06/05/2019    Priority: Low  . Venous stasis of both lower extremities 06/05/2019    Priority: Low  . Mastoiditis 03/09/2017    Priority: Low  . Palpitations 08/11/2016    Priority: Low  . Eustachian tube dysfunction 12/17/2014    Priority: Low  . SI (sacroiliac) joint dysfunction 09/19/2013    Priority: Low  . Nonspecific abnormal electrocardiogram (ECG) (EKG) 07/14/2012    Priority: Low  . Allergic reaction, history of 10/25/2011    Priority: Low  . Rectal or anal pain 10/01/2010    Priority: Low  . Allergic rhinitis 06/13/2007    Priority: Low  . GERD 06/13/2007    Priority:  Low  . IBS (irritable bowel syndrome) 06/13/2007    Priority: Low  . Nonallopathic lesion of sacral region 09/19/2013  . Nonallopathic lesion of thoracic region 09/19/2013  . Nonallopathic lesion of lumbosacral region 09/19/2013   Medications- reviewed and updated Current Outpatient Medications  Medication Sig Dispense Refill  . Acetylcarnitine HCl (ACETYL L-CARNITINE PO) Take by mouth.    Marland Kitchen atorvastatin (LIPITOR) 40 MG tablet Take 1 tablet (40 mg total) by mouth daily. 90 tablet 3  . b complex vitamins tablet Take 1 tablet by mouth daily.    Marland Kitchen buPROPion (WELLBUTRIN XL) 300 MG 24 hr tablet Take 1 tablet (300 mg total) by mouth daily. 90 tablet 3  . chlorthalidone (HYGROTON) 25 MG tablet Take 0.5-1 tablets (12.5-25 mg total) by mouth daily. 90 tablet 3  . Cholecalciferol (VITAMIN D) 2000 UNITS CAPS Take 1 capsule by mouth daily.    . clonazePAM (KLONOPIN) 0.5 MG tablet TAKE 1 TABLET BY MOUTH TWO TIMES DAILY AS NEEDED FOR ANXIETY 60 tablet 2  . Coenzyme Q10 (CO Q 10 PO) Take 1 capsule by mouth daily.    Marland Kitchen diltiazem (CARDIZEM CD) 180 MG 24 hr capsule Take 1 capsule (180 mg total) by mouth daily. 90 capsule 3  . EPINEPHrine 0.3 mg/0.3 mL IJ SOAJ injection Inject 0.3 mg into the muscle as needed for anaphylaxis. 2 each 1  . gabapentin (NEURONTIN) 300 MG capsule Take 2 capsules (600 mg total)  by mouth at bedtime. 180 capsule 3  . MAGNESIUM PO Take 1 capsule by mouth daily.    . potassium chloride (KLOR-CON) 10 MEQ tablet TAKE 2 TABLETS BY MOUTH TWO TIMES DAILY 120 tablet 3  . Saw Palmetto, Serenoa repens, (SAW PALMETTO PO) Take 1 capsule by mouth 2 (two) times daily.    . sildenafil (REVATIO) 20 MG tablet Take 1-5 tablets as needed once every 48 hours for erectile dysfunction 50 tablet 3  . testosterone cypionate (DEPOTESTOSTERONE CYPIONATE) 200 MG/ML injection INJECT 0.3 MLS WEEKLY 3 mL 2  . Zinc 15 MG CAPS Take by mouth.    Marland Kitchen HYDROcodone-acetaminophen (NORCO/VICODIN) 5-325 MG tablet Take 0.5-1  tablets by mouth 2 (two) times daily as needed (No more than 1-3x a week use.). (Patient not taking: Reported on 06/17/2020) 55 tablet 0   No current facility-administered medications for this visit.     Objective:  BP 128/72   Pulse 66   Temp 97.9 F (36.6 C) (Temporal)   Ht 5\' 8"  (1.727 m)   Wt 181 lb 3.2 oz (82.2 kg)   SpO2 98%   BMI 27.55 kg/m  Gen: NAD, resting comfortably CV: RRR no murmurs rubs or gallops Lungs: CTAB no crackles, wheeze, rhonchi Ext: no edema Skin: warm, dry     Assessment and Plan   # dentist did x-ray and placed him on antibiotics a few times- had CT of his head/sinuses and was reassuring. In the end patient thinks grinding teeth.   #History of lung cancer/actually carcinoid tumor malignant-we follow an annual chest x-ray on him.  Last time March 2021-later with CT chest abdomen pelvis 03/05/2020  #Chronic pain syndrome/neuropathy S: Medication: Gabapentin 600 mg at bedtime, sparing hydrocodone twice a week (has been out- pain has been worse over last month out).  He knows to space hydrocodone and clonazepam at least 12 hours  -Does stationary bike for exercise  Some- but outdoors more lately A/P: some worsening- seems reasonable for now to continue current meds- refill hydrocodone  -PDMP reviewed andlow risk utilization.  Last hydrocodone filled November 2021 with last clonazepam refill in February -UDS last updated June 05, 2019-we will update today -Controlled substance contract on file from 07/08/2017  #hypertension/hypokalemia requiring potassium 20 mEq twice daily S: medication: Chlorthalidone 25 mg, diltiazem 180 mg extended release (amlodipine caused edema in past).  Some elevations in the past and have considered increasing diltiazem to 240 mg  Home readings #s: Home blood pressures well controlled 120s or 130s over 70s A/P: Stable. Continue current medications. Down 8 lbs on home scalesand running some lower  #Hypertriglyceridemia S:  Medication:I prescribed atorvastatin 10 mg in the past patient has been concerned about potential for muscle aches.   Had microvascular ischemia on prior MRI and later started on atorvastatin 40 mg daily- was having some knee pain and hip painand stopped for a few weeks pain got better- restarted 20 mg and so far doing ok Lab Results  Component Value Date   CHOL 144 02/14/2020   HDL 57 02/14/2020   LDLCALC 69 02/14/2020   TRIG 92 02/14/2020   CHOLHDL 2.5 02/14/2020  A/P: hopefully controlled  With weight loss despite decreasing atorvastatin to 20mg - will check LDL last time and some consideration for lipoprotein A.    # Depression/GAD S: Medication:Wellbutrin 300 mg extended release.  Uses clonazepam 1-2 times a day-we have discussed trying to cut back.  Has not tolerated SSRIs due to palpitations A/P: reasonable control- discussed trying to  push clonazepam down to once a day now retired.   #Hypogonadism-on testosterone treatment with0.3 mL weekly off testosterone through Dr. Dwyane Dee. hgb slightly high last time- repeat next visit Lab Results  Component Value Date   TESTOSTERONE 478.06 11/28/2019   Recommended follow up: Return in about 4 months (around 10/17/2020) for physical or sooner if needed. on new insurance  Future Appointments  Date Time Provider [place]  07/15/2020  9:45 AM [facility] None  07/21/2020  9:00 AM Elayne Snare, MD [facility] None   Lab/Order associations:   ICD-10-CM   1. Essential hypertension  I10   2. Generalized anxiety disorder  F41.1   3. Recurrent major depressive disorder, in partial remission ([place])  F33.41   4. High risk medication use  Z79.899   5. Chronic pain syndrome  G89.4   6. Peripheral neuropathy, idiopathic  G60.9   7. Low testosterone  R79.89    No orders of the defined types were placed in this encounter.  Return precautions advised.  [name], MD

## 2020-06-17 ENCOUNTER — Other Ambulatory Visit: Payer: Self-pay

## 2020-06-17 ENCOUNTER — Encounter: Payer: Self-pay | Admitting: Family Medicine

## 2020-06-17 ENCOUNTER — Ambulatory Visit (INDEPENDENT_AMBULATORY_CARE_PROVIDER_SITE_OTHER): Payer: Medicare Other | Admitting: Family Medicine

## 2020-06-17 VITALS — BP 128/72 | HR 66 | Temp 97.9°F | Ht 68.0 in | Wt 181.2 lb

## 2020-06-17 DIAGNOSIS — R7989 Other specified abnormal findings of blood chemistry: Secondary | ICD-10-CM | POA: Diagnosis not present

## 2020-06-17 DIAGNOSIS — F411 Generalized anxiety disorder: Secondary | ICD-10-CM | POA: Diagnosis not present

## 2020-06-17 DIAGNOSIS — G609 Hereditary and idiopathic neuropathy, unspecified: Secondary | ICD-10-CM

## 2020-06-17 DIAGNOSIS — K219 Gastro-esophageal reflux disease without esophagitis: Secondary | ICD-10-CM

## 2020-06-17 DIAGNOSIS — Z79899 Other long term (current) drug therapy: Secondary | ICD-10-CM | POA: Diagnosis not present

## 2020-06-17 DIAGNOSIS — G894 Chronic pain syndrome: Secondary | ICD-10-CM | POA: Diagnosis not present

## 2020-06-17 DIAGNOSIS — I10 Essential (primary) hypertension: Secondary | ICD-10-CM | POA: Diagnosis not present

## 2020-06-17 DIAGNOSIS — F3341 Major depressive disorder, recurrent, in partial remission: Secondary | ICD-10-CM | POA: Diagnosis not present

## 2020-06-17 MED ORDER — CLONAZEPAM 0.5 MG PO TABS: 0.5000 mg | ORAL_TABLET | Freq: Two times a day (BID) | ORAL | 1 refills | Status: DC | PRN

## 2020-06-17 MED ORDER — HYDROCODONE-ACETAMINOPHEN 5-325 MG PO TABS: 0.5000 | ORAL_TABLET | Freq: Two times a day (BID) | ORAL | 0 refills | Status: DC | PRN

## 2020-06-19 LAB — DRUG MONITORING, PANEL 8 WITH CONFIRMATION, URINE
6 Acetylmorphine: NEGATIVE ng/mL (ref <10)
Alcohol Metabolites: NEGATIVE ng/mL
Amphetamines: NEGATIVE ng/mL (ref <500)
Benzodiazepines: NEGATIVE ng/mL (ref <100)
Buprenorphine, Urine: NEGATIVE ng/mL (ref <5)
Cocaine Metabolite: NEGATIVE ng/mL (ref <150)
Codeine: NEGATIVE ng/mL (ref <50)
Creatinine: 67.4 mg/dL
Hydrocodone: 118 ng/mL — ABNORMAL HIGH (ref <50)
Hydromorphone: NEGATIVE ng/mL (ref <50)
MDMA: NEGATIVE ng/mL (ref <500)
Marijuana Metabolite: NEGATIVE ng/mL (ref <20)
Morphine: NEGATIVE ng/mL (ref <50)
Norhydrocodone: 101 ng/mL — ABNORMAL HIGH (ref <50)
Opiates: POSITIVE ng/mL — AB (ref <100)
Oxidant: NEGATIVE ug/mL
Oxycodone: NEGATIVE ng/mL (ref <100)
pH: 5.5 (ref 4.5–9.0)

## 2020-06-19 LAB — DM TEMPLATE

## 2020-06-23 ENCOUNTER — Encounter: Payer: Self-pay | Admitting: Family Medicine

## 2020-06-24 ENCOUNTER — Encounter: Payer: Self-pay | Admitting: Family Medicine

## 2020-06-24 NOTE — Telephone Encounter (Signed)
Patient called in requesting an appointment, next available is not until Monday. Offered to schedule [name] Page with another provider but declined saying he is just going to send a message for mychart to see what we would recommend he do.

## 2020-06-25 ENCOUNTER — Ambulatory Visit: Payer: 59 | Admitting: Endocrinology

## 2020-06-25 ENCOUNTER — Other Ambulatory Visit: Payer: Self-pay

## 2020-06-25 MED ORDER — HYDROCODONE-ACETAMINOPHEN 5-325 MG PO TABS: 0.5000 | ORAL_TABLET | Freq: Two times a day (BID) | ORAL | 0 refills | Status: DC | PRN

## 2020-06-25 NOTE — Telephone Encounter (Signed)
I submitted PA for this yesterday Evening.

## 2020-06-26 ENCOUNTER — Other Ambulatory Visit (HOSPITAL_BASED_OUTPATIENT_CLINIC_OR_DEPARTMENT_OTHER): Payer: Self-pay

## 2020-06-30 ENCOUNTER — Encounter: Payer: Self-pay | Admitting: Family Medicine

## 2020-07-04 ENCOUNTER — Other Ambulatory Visit (HOSPITAL_COMMUNITY): Payer: Self-pay | Admitting: *Deleted

## 2020-07-08 ENCOUNTER — Other Ambulatory Visit: Payer: Self-pay

## 2020-07-08 ENCOUNTER — Ambulatory Visit (HOSPITAL_BASED_OUTPATIENT_CLINIC_OR_DEPARTMENT_OTHER)
Admission: RE | Admit: 2020-07-08 | Discharge: 2020-07-08 | Disposition: A | Discharge status: Home / Self Care | Payer: Medicare Other | Source: Ambulatory Visit | Attending: Cardiology | Admitting: Cardiology

## 2020-07-13 ENCOUNTER — Other Ambulatory Visit: Payer: Self-pay | Admitting: Family Medicine

## 2020-07-15 ENCOUNTER — Other Ambulatory Visit: Payer: 59

## 2020-07-15 DIAGNOSIS — Z20822 Contact with and (suspected) exposure to covid-19: Secondary | ICD-10-CM | POA: Diagnosis not present

## 2020-07-21 ENCOUNTER — Telehealth: Payer: 59 | Admitting: Endocrinology

## 2020-08-01 ENCOUNTER — Other Ambulatory Visit (INDEPENDENT_AMBULATORY_CARE_PROVIDER_SITE_OTHER): Payer: Medicare Other

## 2020-08-01 ENCOUNTER — Other Ambulatory Visit: Payer: Self-pay | Admitting: Endocrinology

## 2020-08-01 ENCOUNTER — Other Ambulatory Visit: Payer: Self-pay

## 2020-08-01 DIAGNOSIS — R7301 Impaired fasting glucose: Secondary | ICD-10-CM

## 2020-08-01 DIAGNOSIS — E291 Testicular hypofunction: Secondary | ICD-10-CM

## 2020-08-01 DIAGNOSIS — I10 Essential (primary) hypertension: Secondary | ICD-10-CM | POA: Diagnosis not present

## 2020-08-01 LAB — COMPREHENSIVE METABOLIC PANEL
ALT: 28 U/L (ref 0–53)
AST: 25 U/L (ref 0–37)
Albumin: 4.4 g/dL (ref 3.5–5.2)
Alkaline Phosphatase: 82 U/L (ref 39–117)
BUN: 14 mg/dL (ref 6–23)
CO2: 32 mEq/L (ref 19–32)
Calcium: 10 mg/dL (ref 8.4–10.5)
Chloride: 102 mEq/L (ref 96–112)
Creatinine, Ser: 1.07 mg/dL (ref 0.40–1.50)
GFR: 70.46 mL/min (ref >60.00)
Glucose, Bld: 93 mg/dL (ref 70–99)
Potassium: 4 mEq/L (ref 3.5–5.1)
Sodium: 142 mEq/L (ref 135–145)
Total Bilirubin: 1.4 mg/dL — ABNORMAL HIGH (ref 0.2–1.2)
Total Protein: 7.1 g/dL (ref 6.0–8.3)

## 2020-08-01 LAB — GLUCOSE, RANDOM: Glucose, Bld: 93 mg/dL (ref 70–99)

## 2020-08-01 LAB — CBC
HCT: 50.3 % (ref 39.0–52.0)
Hemoglobin: 17.4 g/dL — ABNORMAL HIGH (ref 13.0–17.0)
MCHC: 34.7 g/dL (ref 30.0–36.0)
MCV: 94.7 fl (ref 78.0–100.0)
Platelets: 227 10*3/uL (ref 150.0–400.0)
RBC: 5.31 Mil/uL (ref 4.22–5.81)
RDW: 12.1 % (ref 11.5–15.5)
WBC: 8.4 10*3/uL (ref 4.0–10.5)

## 2020-08-01 LAB — HEMOGLOBIN A1C: Hgb A1c MFr Bld: 5.5 % (ref 4.6–6.5)

## 2020-08-01 LAB — TESTOSTERONE: Testosterone: 398.07 ng/dL (ref 300.00–890.00)

## 2020-08-06 ENCOUNTER — Encounter: Payer: Self-pay | Admitting: Endocrinology

## 2020-08-06 ENCOUNTER — Telehealth (INDEPENDENT_AMBULATORY_CARE_PROVIDER_SITE_OTHER): Payer: Medicare Other | Admitting: Endocrinology

## 2020-08-06 ENCOUNTER — Other Ambulatory Visit: Payer: Self-pay

## 2020-08-06 DIAGNOSIS — R7301 Impaired fasting glucose: Secondary | ICD-10-CM | POA: Diagnosis not present

## 2020-08-06 DIAGNOSIS — D751 Secondary polycythemia: Secondary | ICD-10-CM | POA: Diagnosis not present

## 2020-08-06 DIAGNOSIS — E291 Testicular hypofunction: Secondary | ICD-10-CM | POA: Diagnosis not present

## 2020-08-06 NOTE — Progress Notes (Signed)
Patient ID: [name] Page, male   DOB: [birth year], 70 y.o.   MRN: [id number]             Chief complaint: Endocrinology follow-up  I connected with the above-named patient by video enabled telemedicine application and verified that I am speaking with the correct person. The patient was explained the limitations of evaluation and management by telemedicine and the availability of in person appointments.  Patient also understood that there may be a patient responsible charge related to this service . Location of the patient: Patient's home . Location of the provider: Physician office Only the patient and myself were participating in the encounter The patient understood the above statements and agreed to proceed.   History of Present Illness   HYPOGONADISM:  Hypogonadismwas diagnosed in ?  2012  He at that time had complaints offatigue, some increased depression, decreased libido although he does not remember symptoms well There is no history of the following: Hot flushes, sweats, breast enlargement, long term anabolic steroid use, history of testicular injury, head injury or mumps in childhood. No history of osteopenia or low impact fracture  At that time an afternoon testosterone level was 214 and free testosterone level low at 32, normal >47   He was also evaluated by a urologist and details are not available He was treated with testosterone gel but he thinks this did not improve his testosterone levels and likely did not continue treatment  Subsequently he was again evaluated in 2015 and with a low testosterone of 239 he was started on testosterone injections With this he thinks she had some improvement in his fatigue and depression His dose had been adjusted from his initial starting dose of 100 mg every 2 weeks and he has been on various regimens including every 3 or 4 weeks injections His testosterone level had been quite variable since about 2016  RECENT  history:  He has been taking 30 mg of testosterone weekly  Because of his increased hematocrit on his initial consultation he was changed to testosterone injections every week instead of every other week After initial adjustment of the dosage regimen he is taking the above dose consistently since 08/2018 He has been consistent with his weekly injection, usually on Thursdays However before his labs he took his injection in couple of days early because of the timing of his lab work  Not complaining of weakness, tiredness/decreased motivation, has had symptoms of decreased libido  Testosterone level has been consistently normal, recently 398 Lab was checked about 4 days after his injection  Hemoglobin has been stable and about the same as in 11/21 although previously  His lab results showtestosterone levels as follows:   Lab Results  Component Value Date   TESTOSTERONE 398.07 08/01/2020   TESTOSTERONE 478.06 11/28/2019   TESTOSTERONE 455.17 02/05/2019   TESTOSTERONE 503.65 10/13/2018   Lab Results  Component Value Date   HGB 17.4 (H) 08/01/2020    Prolactin level: Normal   Lab Results  Component Value Date   LH <0.20 (L) 04/07/2017   Lab Results  Component Value Date   HGB 17.4 (H) 08/01/2020   HGB 17.5 (H) 02/14/2020   HGB 16.3 11/28/2019   HGB 17.0 11/01/2019       Allergies as of 08/06/2020      Reactions   Bee Venom Anaphylaxis   Flagyl [metronidazole Hcl] Other (See Comments)   Neuropathy    Nsaids    REACTION: intolereance   Protonix [pantoprazole]  Abdominal Pain and Diarrhea      Medication List       Accurate as of Aug 06, 2020  9:48 AM. If you have any questions, ask your nurse or doctor.        ACETYL L-CARNITINE PO Take by mouth.   atorvastatin 40 MG tablet Commonly known as: LIPITOR Take 1 tablet (40 mg total) by mouth daily.   b complex vitamins tablet Take 1 tablet by mouth daily.   B-D SYRINGE LUER-LOK 1CC 1 ML Misc Generic  drug: Syringe (Disposable) USE AS DIRECTED ONCE A WEEK WITH TESTOSTERONE   buPROPion 300 MG 24 hr tablet Commonly known as: WELLBUTRIN XL Take 1 tablet (300 mg total) by mouth daily.   chlorthalidone 25 MG tablet Commonly known as: HYGROTON Take 0.5-1 tablets (12.5-25 mg total) by mouth daily.   clonazePAM 0.5 MG tablet Commonly known as: KLONOPIN Take 1 tablet (0.5 mg total) by mouth 2 (two) times daily as needed for anxiety.   CO Q 10 PO Take 1 capsule by mouth daily.   diltiazem 180 MG 24 hr capsule Commonly known as: CARDIZEM CD Take 1 capsule (180 mg total) by mouth daily.   EPINEPHrine 0.3 mg/0.3 mL Soaj injection Commonly known as: EPI-PEN INJECT 1 SYRINGE INTO THE MUSCLE AS NEEDED FOR ANAPHYLAXIS   gabapentin 300 MG capsule Commonly known as: NEURONTIN Take 2 capsules (600 mg total) by mouth at bedtime.   HYDROcodone-acetaminophen 5-325 MG tablet Commonly known as: NORCO/VICODIN Take 0.5-1 tablets by mouth 2 (two) times daily as needed (No more than 1-3x a week use. 3 month supply).   MAGNESIUM PO Take 1 capsule by mouth daily.   potassium chloride 10 MEQ tablet Commonly known as: KLOR-CON TAKE 2 TABLETS BY MOUTH TWO TIMES DAILY   SAW PALMETTO PO Take 1 capsule by mouth 2 (two) times daily.   sildenafil 20 MG tablet Commonly known as: REVATIO Take 1-5 tablets as needed once every 48 hours for erectile dysfunction   testosterone cypionate 200 MG/ML injection Commonly known as: DEPOTESTOSTERONE CYPIONATE INJECT 0.3 MLS WEEKLY   Vitamin D 50 MCG (2000 UT) Caps Take 1 capsule by mouth daily.   Zinc 15 MG Caps Take by mouth.       Allergies:  Allergies  Allergen Reactions  . Bee Venom Anaphylaxis  . Flagyl [Metronidazole Hcl] Other (See Comments)    Neuropathy   . Nsaids     REACTION: intolereance  . Protonix [Pantoprazole]     Abdominal Pain and Diarrhea    Past Medical History:  Diagnosis Date  . Allergic rhinitis, cause unspecified   .  Anxiety   . Arthritis    OA  . Cancer ([place])    lung tumor   . 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 Cataract    forming  . Depressive disorder, not elsewhere classified   . Esophageal reflux    past hx   . HTN (hypertension)   . Lymphocytic colitis   . Neuromuscular disorder (HCC)    neuropathy  . Neuropathy   . Other testicular hypofunction   . Palpitations   . Personal history of urinary calculi     Past Surgical History:  Procedure Laterality Date  . COLONOSCOPY    . 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 parotid tumor surgery    . TONSILLECTOMY AND ADENOIDECTOMY      Family History  Problem Relation Age of  Onset  . Hypertension Mother   . Anxiety disorder Mother   . Non-Hodgkin's lymphoma Mother   . Dementia Father   . Depression Father        and anxiety  . Hypertension Father   . Sudden death Paternal Uncle 20  . Colon cancer Paternal Uncle   . Hypertension Brother   . Early death Neg Hx   . Heart disease Neg Hx        mother in 98s had lesoin  . Colon polyps Neg Hx   . Esophageal cancer Neg Hx   . Rectal cancer Neg Hx   . Stomach cancer Neg Hx     Social History:  reports that he has never smoked. He has never used smokeless tobacco. He reports current alcohol use. He reports that he does not use drugs.  Review of Systems  IMPAIRED fasting glucose:   Although his A1c is normal usually his fasting glucose previously had been high  Fasting glucose on his glucose tolerance test done on 03/31/18 was 105 but 2-hour reading was only 103  His fasting glucose has been more consistently normal now  His weight appears to be about the same recently as in 2020 He is usually fairly consistent with eating healthy meals He has tried to be active  Wt Readings from Last 3 Encounters:  06/17/20 181 lb 3.2 oz (82.2 kg)  02/14/20 181 lb 3.2 oz (82.2 kg)  11/01/19 181 lb (82.1 kg)   Lab Results  Component  Value Date   GLUCOSE 93 08/01/2020   GLUCOSE 93 08/01/2020   GLUCOSE 75 02/14/2020   Lab Results  Component Value Date   HGBA1C 5.5 08/01/2020   HGBA1C 5.5 11/28/2019   HGBA1C 5.2 02/05/2019   Lab Results  Component Value Date   LDLCALC 69 02/14/2020   CREATININE 1.07 08/01/2020    General Examination:   There were no vitals taken for this visit.     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  He is on treatment with Depo-Testosterone once a week, continues to be on a stable dose of taking 30 mg weekly using 0.15 mL of the 200 mg/mL regimen  Subjectively has been doing well with no unusual fatigue or recurrence of original symptoms However he still has some decreased libido  Testosterone level is again normal now at 398 although slightly lower than before Currently he is subjectively doing well Since his hemoglobin is staying slightly above normal will not increase the dose  IMPAIRED fasting glucose with normal A1c  Fasting glucose and A1c are now consistently normal  LIPIDS: Well-controlled on Lipitor with LDL below 100, reassured him that joint pains are usually not symptoms of side effects  Plan: He will continue 30 mg testosterone injections weekly using the 200 mg/mL preparation He can have his hematocrit checked again with PCP in 4 months at the follow-up visit Needs to continue regular physical activities and aerobic exercise  Follow-up in 6 months  [name] Page 08/06/2020, 9:48 AM     Note: This office note was prepared with Dragon voice recognition system technology. Any transcriptional errors that result from this process are unintentional.

## 2020-08-08 ENCOUNTER — Other Ambulatory Visit: Payer: Self-pay | Admitting: Endocrinology

## 2020-08-08 MED ORDER — SYRINGE (DISPOSABLE) 1 ML MISC: 1 refills | Status: DC

## 2020-08-08 MED ORDER — "NEEDLE (DISP) 22G X 1"" MISC": 0 refills | Status: DC

## 2020-08-18 ENCOUNTER — Ambulatory Visit: Payer: Medicare Other | Attending: Internal Medicine

## 2020-08-18 ENCOUNTER — Other Ambulatory Visit (HOSPITAL_BASED_OUTPATIENT_CLINIC_OR_DEPARTMENT_OTHER): Payer: Self-pay

## 2020-08-18 ENCOUNTER — Other Ambulatory Visit: Payer: Self-pay

## 2020-08-18 DIAGNOSIS — Z23 Encounter for immunization: Secondary | ICD-10-CM

## 2020-08-18 MED ORDER — PFIZER-BIONT COVID-19 VAC-TRIS 30 MCG/0.3ML IM SUSP
INTRAMUSCULAR | 0 refills | Status: DC
  Filled 2020-08-18: qty 0.3, 1d supply, fill #0

## 2020-08-18 MED ORDER — COVID-19 AT HOME ANTIGEN TEST VI KIT
PACK | 0 refills | Status: DC
Start: 2020-08-18 — End: 2020-08-18
  Filled 2020-08-18: qty 4, 4d supply, fill #0

## 2020-08-18 NOTE — Progress Notes (Signed)
   Covid-19 Vaccination Clinic  Name:  [name] Page    MRN: [id number] DOB: 1950/08/04  08/18/2020  [name] Page was observed post Covid-19 immunization for 15 minutes without incident. He was provided with Vaccine Information Sheet and instruction to access the V-Safe system.   [name] Page was instructed to call 911 with any severe reactions post vaccine: Marland Kitchen Difficulty breathing  . Swelling of face and throat  . A fast heartbeat  . A bad rash all over body  . Dizziness and weakness   Immunizations Administered    Name Date Dose VIS Date Route   PFIZER Comrnaty(Gray TOP) Covid-19 Vaccine 08/18/2020  9:44 AM 0.3 mL 03/13/2020 Intramuscular   Manufacturer: [employer]   Lot: [id number]   NDC: [phone]

## 2020-08-27 ENCOUNTER — Other Ambulatory Visit: Payer: Self-pay | Admitting: Family Medicine

## 2020-10-28 ENCOUNTER — Other Ambulatory Visit: Payer: Self-pay | Admitting: Family Medicine

## 2020-10-28 ENCOUNTER — Other Ambulatory Visit: Payer: Self-pay | Admitting: Endocrinology

## 2020-10-28 DIAGNOSIS — H903 Sensorineural hearing loss, bilateral: Secondary | ICD-10-CM | POA: Diagnosis not present

## 2020-10-31 ENCOUNTER — Other Ambulatory Visit: Payer: Self-pay

## 2020-10-31 DIAGNOSIS — E291 Testicular hypofunction: Secondary | ICD-10-CM

## 2020-10-31 MED ORDER — "NEEDLE (DISP) 22G X 1"" MISC": 2 refills | Status: DC

## 2020-11-03 ENCOUNTER — Other Ambulatory Visit: Payer: Self-pay

## 2020-11-03 ENCOUNTER — Encounter: Payer: Self-pay | Admitting: Family Medicine

## 2020-11-03 ENCOUNTER — Telehealth (INDEPENDENT_AMBULATORY_CARE_PROVIDER_SITE_OTHER): Payer: Medicare Other | Admitting: Family Medicine

## 2020-11-03 ENCOUNTER — Other Ambulatory Visit (HOSPITAL_BASED_OUTPATIENT_CLINIC_OR_DEPARTMENT_OTHER): Payer: Self-pay

## 2020-11-03 VITALS — BP 120/70 | Ht 68.0 in | Wt 181.2 lb

## 2020-11-03 DIAGNOSIS — I700 Atherosclerosis of aorta: Secondary | ICD-10-CM | POA: Diagnosis not present

## 2020-11-03 DIAGNOSIS — E781 Pure hyperglyceridemia: Secondary | ICD-10-CM | POA: Diagnosis not present

## 2020-11-03 DIAGNOSIS — I10 Essential (primary) hypertension: Secondary | ICD-10-CM | POA: Diagnosis not present

## 2020-11-03 DIAGNOSIS — Z79899 Other long term (current) drug therapy: Secondary | ICD-10-CM

## 2020-11-03 DIAGNOSIS — R351 Nocturia: Secondary | ICD-10-CM

## 2020-11-03 MED ORDER — EPINEPHRINE 0.3 MG/0.3ML IJ SOAJ
INTRAMUSCULAR | 1 refills | Status: DC
  Filled 2020-11-03: qty 2, 2d supply, fill #0
  Filled 2020-11-03: qty 2, 4d supply, fill #0

## 2020-11-03 MED ORDER — HYDROCODONE-ACETAMINOPHEN 5-325 MG PO TABS
0.5000 | ORAL_TABLET | Freq: Two times a day (BID) | ORAL | 0 refills | Status: DC | PRN
  Filled 2020-11-03: qty 55, 28d supply, fill #0

## 2020-11-03 NOTE — Patient Instructions (Signed)
Health Maintenance Due  Topic Date Due   Zoster Vaccines- Shingrix (1 of 2) Never done   INFLUENZA VACCINE  11/03/2020    Recommended follow up: No follow-ups on file.

## 2020-11-03 NOTE — Progress Notes (Signed)
Phone [phone] Virtual visit via Video note   Subjective:  Chief complaint: Chief Complaint  Patient presents with   Hypertension   Hypogonadism    Depression   Aortic Atherosclerosis    This visit type was conducted due to national recommendations for restrictions regarding the COVID-19 Pandemic (e.g. social distancing).  This format is felt to be most appropriate for this patient at this time balancing risks to patient and risks to population by having him in for in person visit.  No physical exam was performed (except for noted visual exam or audio findings with Telehealth visits).    Our team/I connected with [name] Page at  8:40 AM EDT by a video enabled telemedicine application (doxy.me or caregility through epic) and verified that I am speaking with the correct person using two identifiers.  Location patient: Home[place] Location provider: [facility], office Persons participating in the virtual visit:  patient  Our team/I discussed the limitations of evaluation and management by telemedicine and the availability of in person appointments. In light of current covid-19 pandemic, patient also understands that we are trying to protect them by minimizing in office contact if at all possible.  The patient expressed consent for telemedicine visit and agreed to proceed. Patient understands insurance will be billed.   Past Medical History-  Patient Active Problem List   Diagnosis Date Noted   Chronic pain syndrome 08/31/2016    Priority: High   History of lung cancer 12/11/2012    Priority: High   Aortic atherosclerosis ([place]) 11/03/2020    Priority: Medium   History of adenomatous polyp of colon 03/24/2018    Priority: Medium   Generalized anxiety disorder 03/12/2014    Priority: Medium   Essential hypertension 03/12/2014    Priority: Medium   Peripheral neuropathy, idiopathic 12/22/2012    Priority: Medium   Dyspnea 07/14/2012    Priority: Medium   Hypertriglyceridemia  07/14/2012    Priority: Medium   Depression 02/16/2010    Priority: Medium   Low testosterone 11/21/2007    Priority: Medium   Chronic bilateral low back pain 06/05/2019    Priority: Low   Venous stasis of both lower extremities 06/05/2019    Priority: Low   Mastoiditis 03/09/2017    Priority: Low   Palpitations 08/11/2016    Priority: Low   Eustachian tube dysfunction 12/17/2014    Priority: Low   SI (sacroiliac) joint dysfunction 09/19/2013    Priority: Low   Nonspecific abnormal electrocardiogram (ECG) (EKG) 07/14/2012    Priority: Low   Allergic reaction, history of 10/25/2011    Priority: Low   Rectal or anal pain 10/01/2010    Priority: Low   Allergic rhinitis 06/13/2007    Priority: Low   GERD 06/13/2007    Priority: Low   IBS (irritable bowel syndrome) 06/13/2007    Priority: Low   Nonallopathic lesion of sacral region 09/19/2013   Nonallopathic lesion of thoracic region 09/19/2013   Nonallopathic lesion of lumbosacral region 09/19/2013    Medications- reviewed and updated Current Outpatient Medications  Medication Sig Dispense Refill   Acetylcarnitine HCl (ACETYL L-CARNITINE PO) Take by mouth.     Ascorbic Acid (VITAMIN C) 1000 MG tablet      atorvastatin (LIPITOR) 40 MG tablet Take 1 tablet (40 mg total) by mouth daily. 90 tablet 3   b complex vitamins tablet Take 1 tablet by mouth daily.     buPROPion (WELLBUTRIN XL) 300 MG 24 hr tablet Take 1 tablet (300 mg  total) by mouth daily. 90 tablet 3   chlorthalidone (HYGROTON) 25 MG tablet Take 0.5-1 tablets (12.5-25 mg total) by mouth daily. 90 tablet 3   Cholecalciferol (VITAMIN D) 2000 UNITS CAPS Take 1 capsule by mouth daily.     clonazePAM (KLONOPIN) 0.5 MG tablet Take 1 tablet (0.5 mg total) by mouth 2 (two) times daily as needed for anxiety. 180 tablet 1   Coenzyme Q10 (CO Q 10 PO) Take 1 capsule by mouth daily.     diltiazem (CARDIZEM CD) 180 MG 24 hr capsule Take 1 capsule (180 mg total) by mouth daily. 90  capsule 3   gabapentin (NEURONTIN) 300 MG capsule Take 2 capsules (600 mg total) by mouth at bedtime. 180 capsule 3   MAGNESIUM PO Take 1 capsule by mouth daily.     NEEDLE, DISP, 22 G 22G X 1" MISC Used to inject testosterone weekly 50 each 2   potassium chloride (KLOR-CON) 10 MEQ tablet TAKE 2 TABLETS BY MOUTH  TWICE DAILY 120 tablet 11   Probiotic Product (PROBIOTIC BLEND PO)      Saw Palmetto, [place] repens, (SAW PALMETTO PO) Take 1 capsule by mouth 2 (two) times daily.     Syringe, Disposable, 1 ML MISC USE AS DIRECTED ONCE A WEEK WITH TESTOSTERONE 25 each 1   testosterone cypionate (DEPOTESTOSTERONE CYPIONATE) 200 MG/ML injection INJECT INTRAMUSCULARLY  0.3ML WEEKLY (DISCARD  UNUSED PORTION AFTER FIRST  USE) 3 mL 3   Zinc 15 MG CAPS Take by mouth.     COVID-19 mRNA Vac-TriS, Pfizer, (PFIZER-BIONT COVID-19 VAC-TRIS) SUSP injection Inject into the muscle. 0.3 mL 0   EPINEPHrine 0.3 mg/0.3 mL IJ SOAJ injection INJECT 1 SYRINGE INTO THE MUSCLE AS NEEDED FOR ANAPHYLAXIS 2 each 1   HYDROcodone-acetaminophen (NORCO/VICODIN) 5-325 MG tablet Take 0.5-1 tablets by mouth 2 (two) times daily as needed (No more than 1-3x a week use. 3 month supply). 55 tablet 0   sildenafil (REVATIO) 20 MG tablet Take 1-5 tablets as needed once every 48 hours for erectile dysfunction (Patient not taking: Reported on 11/03/2020) 50 tablet 3   No current facility-administered medications for this visit.     Objective:  BP 120/70 Comment: most recent home reading  Ht 5\' 8"  (1.727 m)   Wt 181 lb 3.5 oz (82.2 kg)   BMI 27.55 kg/m  self reported vitals Gen: NAD, resting comfortably Lungs: nonlabored, normal respiratory rate  Skin: appears dry, no obvious rash     Assessment and Plan   #social update- doing some prn work. Did just 3 days this year but helps as needed.  -exercising more with retirement- stationary bike and some treadmill (tolerating with neuropathy lately)  #History of lung cancer/actually  carcinoid tumor malignant-we follow an annual chest x-ray on him.  Last time March 2021-later with CT chest abdomen pelvis 03/05/2020  #Chronic pain syndrome/neuropathy S: Medication: Gabapentin 600 mg at bedtime (sparing takes second dose), sparing hydrocodone twice a week.  He knows to space hydrocodone and clonazepam at least 12 hours  -having a lot of back pain recently- any level of lifting sets him back - continues to have significant neuropathic pain A/P: neuropathy is stable- continue current meds- did refill hydrocodone  -PDMP reviewed and last refill #55 march 24 and last clonazepam June 14th.  -UDS last updated 06/17/20 -Controlled substance contract on file from4/08/2017  #hypertension/hypokalemia requiring potassium 20 mEq twice daily S: medication: Chlorthalidone 25 mg (experimented with half dose and every other day without significant change  in urinary symptoms), diltiazem 180 mg extended release (amlodipine caused edema in past).   Home readings #s: excellent overall including below. Hr in high 50s low 60s BP Readings from Last 3 Encounters:  11/03/20 120/70  06/17/20 128/72  02/14/20 128/66   A/P: Stable. Continue current medications.   #Hypertriglyceridemia/aortic atherosclerosis on the other hand with coronary calcium score of 0 on 07/08/2020 S: Medication:Atorvastatin 40 mg (half tablet so 20 mg)  Had microvascular ischemia on prior MRI Lab Results  Component Value Date   CHOL 144 02/14/2020   HDL 57 02/14/2020   LDLCALC 69 02/14/2020   TRIG 92 02/14/2020   CHOLHDL 2.5 02/14/2020   A/P:  hopefully controlled- update lipid panel (new insurance this year)  #patient concerned about low b12- last level normal- homocysteine and methylmalonic acid checked in past   # Depression/GAD S: Medication:Wellbutrin 300 mg XR .  Uses clonazepam 1-2 times a day-we have discussed trying to cut back.  Has not tolerated SSRIs due to palpitations -altered sleep is due to nocturia,  tends to overeat Depression screen [facility] 2/9 11/03/2020 06/17/2020 02/14/2020  Decreased Interest 0 0 1  Down, Depressed, Hopeless 0 0 3  PHQ - 2 Score 0 0 4  Altered sleeping 3 0 2  Tired, decreased energy 0 0 3  Change in appetite 3 0 0  Feeling bad or failure about yourself  0 0 0  Trouble concentrating 0 1 1  Moving slowly or fidgety/restless 0 0 1  Suicidal thoughts 0 0 0  PHQ-9 Score 6 1 11   Difficult doing work/chores Not difficult at all Not difficult at all Somewhat difficult  Some recent data might be hidden  A/P: wellc ontrolled with no anhedonia or depressed mood- continue current meds  #Hypogonadism/polycythemia-on testosterone treatment with Dr. Dwyane Dee still.  Hemoglobin slightly high in the past Lab Results  Component Value Date   PSA 0.26 02/14/2020   PSA 0.22 02/05/2019   PSA 0.23 10/13/2018    Lab Results  Component Value Date   WBC 8.4 08/01/2020   HGB 17.4 (H) 08/01/2020   HCT 50.3 08/01/2020   MCV 94.7 08/01/2020   PLT 227.0 08/01/2020   #Vitamin D concern S: Medication: 2000 units a day Last vitamin D Lab Results  Component Value Date   VD25OH 40.26 12/01/2018  A/P: will order with long term vitamin D/high risk med to make sure levels not getting too high   Recommended follow up: keep September CPE Future Appointments  Date Time Provider [place]  12/23/2020 10:40 AM Marin Olp, MD [facility]  02/10/2021  8:15 AM [facility] LAB [facility] None  02/13/2021  8:15 AM Elayne Snare, MD [facility] None    Lab/Order associations:   ICD-10-CM   1. Hypertriglyceridemia  E78.1 CBC with Differential/Platelet    Comprehensive metabolic panel    Lipid panel    CANCELED: LDL cholesterol, direct    2. Aortic atherosclerosis (HCC)  I70.0 Lipid panel    3. Essential hypertension  I10 CBC with Differential/Platelet    Comprehensive metabolic panel    CANCELED: LDL cholesterol, direct    4. High risk medication use  Z79.899 VITAMIN D 25  Hydroxy (Vit-D Deficiency, Fractures)    5. Nocturia  R35.1 PSA      Meds ordered this encounter  Medications   HYDROcodone-acetaminophen (NORCO/VICODIN) 5-325 MG tablet    Sig: Take 0.5-1 tablets by mouth 2 (two) times daily as needed (No more than 1-3x a week use. 3 month  supply).    Dispense:  55 tablet    Refill:  0   EPINEPHrine 0.3 mg/0.3 mL IJ SOAJ injection    Sig: INJECT 1 SYRINGE INTO THE MUSCLE AS NEEDED FOR ANAPHYLAXIS    Dispense:  2 each    Refill:  1   Return precautions advised.  [name], MD

## 2020-11-05 ENCOUNTER — Other Ambulatory Visit (HOSPITAL_BASED_OUTPATIENT_CLINIC_OR_DEPARTMENT_OTHER): Payer: Self-pay

## 2020-11-12 ENCOUNTER — Other Ambulatory Visit (INDEPENDENT_AMBULATORY_CARE_PROVIDER_SITE_OTHER): Payer: Medicare Other

## 2020-11-12 ENCOUNTER — Other Ambulatory Visit: Payer: Self-pay

## 2020-11-12 DIAGNOSIS — R351 Nocturia: Secondary | ICD-10-CM | POA: Diagnosis not present

## 2020-11-12 DIAGNOSIS — I10 Essential (primary) hypertension: Secondary | ICD-10-CM

## 2020-11-12 DIAGNOSIS — I700 Atherosclerosis of aorta: Secondary | ICD-10-CM

## 2020-11-12 DIAGNOSIS — E781 Pure hyperglyceridemia: Secondary | ICD-10-CM

## 2020-11-12 DIAGNOSIS — Z79899 Other long term (current) drug therapy: Secondary | ICD-10-CM | POA: Diagnosis not present

## 2020-11-12 LAB — COMPREHENSIVE METABOLIC PANEL
ALT: 29 U/L (ref 0–53)
AST: 22 U/L (ref 0–37)
Albumin: 4.1 g/dL (ref 3.5–5.2)
Alkaline Phosphatase: 73 U/L (ref 39–117)
BUN: 14 mg/dL (ref 6–23)
CO2: 28 mEq/L (ref 19–32)
Calcium: 9.2 mg/dL (ref 8.4–10.5)
Chloride: 100 mEq/L (ref 96–112)
Creatinine, Ser: 1 mg/dL (ref 0.40–1.50)
GFR: 76.27 mL/min (ref >60.00)
Glucose, Bld: 109 mg/dL — ABNORMAL HIGH (ref 70–99)
Potassium: 3.3 mEq/L — ABNORMAL LOW (ref 3.5–5.1)
Sodium: 139 mEq/L (ref 135–145)
Total Bilirubin: 1 mg/dL (ref 0.2–1.2)
Total Protein: 6.3 g/dL (ref 6.0–8.3)

## 2020-11-12 LAB — CBC WITH DIFFERENTIAL/PLATELET
Basophils Absolute: 0 10*3/uL (ref 0.0–0.1)
Basophils Relative: 0.6 % (ref 0.0–3.0)
Eosinophils Absolute: 0.2 10*3/uL (ref 0.0–0.7)
Eosinophils Relative: 2.7 % (ref 0.0–5.0)
HCT: 46.2 % (ref 39.0–52.0)
Hemoglobin: 16 g/dL (ref 13.0–17.0)
Lymphocytes Relative: 28.2 % (ref 12.0–46.0)
Lymphs Abs: 1.7 10*3/uL (ref 0.7–4.0)
MCHC: 34.7 g/dL (ref 30.0–36.0)
MCV: 93.9 fl (ref 78.0–100.0)
Monocytes Absolute: 0.7 10*3/uL (ref 0.1–1.0)
Monocytes Relative: 12.1 % — ABNORMAL HIGH (ref 3.0–12.0)
Neutro Abs: 3.4 10*3/uL (ref 1.4–7.7)
Neutrophils Relative %: 56.4 % (ref 43.0–77.0)
Platelets: 232 10*3/uL (ref 150.0–400.0)
RBC: 4.92 Mil/uL (ref 4.22–5.81)
RDW: 11.9 % (ref 11.5–15.5)
WBC: 6 10*3/uL (ref 4.0–10.5)

## 2020-11-12 LAB — LIPID PANEL
Cholesterol: 116 mg/dL (ref 0–200)
HDL: 46.5 mg/dL (ref >39.00)
LDL Cholesterol: 57 mg/dL (ref 0–99)
NonHDL: 69.48
Total CHOL/HDL Ratio: 2
Triglycerides: 60 mg/dL (ref 0.0–149.0)
VLDL: 12 mg/dL (ref 0.0–40.0)

## 2020-11-12 LAB — VITAMIN D 25 HYDROXY (VIT D DEFICIENCY, FRACTURES): VITD: 65.22 ng/mL (ref 30.00–100.00)

## 2020-11-12 LAB — PSA: PSA: 0.26 ng/mL (ref 0.10–4.00)

## 2020-11-21 ENCOUNTER — Other Ambulatory Visit: Payer: Self-pay

## 2020-11-21 MED ORDER — EPINEPHRINE 0.3 MG/0.3ML IJ SOAJ: INTRAMUSCULAR | 1 refills | Status: AC

## 2020-11-30 ENCOUNTER — Encounter: Payer: Self-pay | Admitting: Family Medicine

## 2020-12-02 ENCOUNTER — Other Ambulatory Visit: Payer: Self-pay

## 2020-12-02 DIAGNOSIS — E876 Hypokalemia: Secondary | ICD-10-CM

## 2020-12-03 ENCOUNTER — Other Ambulatory Visit: Payer: Self-pay

## 2020-12-03 ENCOUNTER — Other Ambulatory Visit (INDEPENDENT_AMBULATORY_CARE_PROVIDER_SITE_OTHER): Payer: Medicare Other

## 2020-12-03 DIAGNOSIS — E876 Hypokalemia: Secondary | ICD-10-CM | POA: Diagnosis not present

## 2020-12-03 LAB — COMPREHENSIVE METABOLIC PANEL
ALT: 46 U/L (ref 0–53)
AST: 28 U/L (ref 0–37)
Albumin: 4.3 g/dL (ref 3.5–5.2)
Alkaline Phosphatase: 79 U/L (ref 39–117)
BUN: 17 mg/dL (ref 6–23)
CO2: 31 mEq/L (ref 19–32)
Calcium: 9.9 mg/dL (ref 8.4–10.5)
Chloride: 99 mEq/L (ref 96–112)
Creatinine, Ser: 1.08 mg/dL (ref 0.40–1.50)
GFR: 69.51 mL/min (ref >60.00)
Glucose, Bld: 109 mg/dL — ABNORMAL HIGH (ref 70–99)
Potassium: 3.5 mEq/L (ref 3.5–5.1)
Sodium: 139 mEq/L (ref 135–145)
Total Bilirubin: 1.5 mg/dL — ABNORMAL HIGH (ref 0.2–1.2)
Total Protein: 7.1 g/dL (ref 6.0–8.3)

## 2020-12-05 ENCOUNTER — Other Ambulatory Visit: Payer: Self-pay | Admitting: Family Medicine

## 2020-12-10 ENCOUNTER — Other Ambulatory Visit: Payer: Self-pay

## 2020-12-11 MED ORDER — TESTOSTERONE CYPIONATE 200 MG/ML IM SOLN: INTRAMUSCULAR | 3 refills | Status: DC

## 2020-12-23 ENCOUNTER — Telehealth: Payer: Self-pay

## 2020-12-23 ENCOUNTER — Ambulatory Visit: Payer: Medicare Other | Attending: Internal Medicine

## 2020-12-23 ENCOUNTER — Other Ambulatory Visit: Payer: Self-pay

## 2020-12-23 ENCOUNTER — Other Ambulatory Visit (HOSPITAL_BASED_OUTPATIENT_CLINIC_OR_DEPARTMENT_OTHER): Payer: Self-pay

## 2020-12-23 ENCOUNTER — Ambulatory Visit (INDEPENDENT_AMBULATORY_CARE_PROVIDER_SITE_OTHER): Payer: Medicare Other | Admitting: Family Medicine

## 2020-12-23 ENCOUNTER — Encounter: Payer: Self-pay | Admitting: Family Medicine

## 2020-12-23 VITALS — BP 120/69 | HR 65 | Temp 97.9°F | Ht 68.0 in | Wt 180.0 lb

## 2020-12-23 DIAGNOSIS — E781 Pure hyperglyceridemia: Secondary | ICD-10-CM

## 2020-12-23 DIAGNOSIS — Z0000 Encounter for general adult medical examination without abnormal findings: Secondary | ICD-10-CM | POA: Diagnosis not present

## 2020-12-23 DIAGNOSIS — I700 Atherosclerosis of aorta: Secondary | ICD-10-CM | POA: Diagnosis not present

## 2020-12-23 DIAGNOSIS — I10 Essential (primary) hypertension: Secondary | ICD-10-CM

## 2020-12-23 DIAGNOSIS — F3341 Major depressive disorder, recurrent, in partial remission: Secondary | ICD-10-CM

## 2020-12-23 DIAGNOSIS — I878 Other specified disorders of veins: Secondary | ICD-10-CM

## 2020-12-23 DIAGNOSIS — Z23 Encounter for immunization: Secondary | ICD-10-CM

## 2020-12-23 LAB — COMPREHENSIVE METABOLIC PANEL
ALT: 51 U/L (ref 0–53)
AST: 43 U/L — ABNORMAL HIGH (ref 0–37)
Albumin: 4.3 g/dL (ref 3.5–5.2)
Alkaline Phosphatase: 68 U/L (ref 39–117)
BUN: 16 mg/dL (ref 6–23)
CO2: 31 mEq/L (ref 19–32)
Calcium: 9.6 mg/dL (ref 8.4–10.5)
Chloride: 100 mEq/L (ref 96–112)
Creatinine, Ser: 0.95 mg/dL (ref 0.40–1.50)
GFR: 81.05 mL/min (ref >60.00)
Glucose, Bld: 104 mg/dL — ABNORMAL HIGH (ref 70–99)
Potassium: 3.4 mEq/L — ABNORMAL LOW (ref 3.5–5.1)
Sodium: 138 mEq/L (ref 135–145)
Total Bilirubin: 1.1 mg/dL (ref 0.2–1.2)
Total Protein: 7.1 g/dL (ref 6.0–8.3)

## 2020-12-23 MED ORDER — PFIZER COVID-19 VAC BIVALENT 30 MCG/0.3ML IM SUSP
INTRAMUSCULAR | 0 refills | Status: DC
  Filled 2020-12-23: qty 0.3, 1d supply, fill #0

## 2020-12-23 NOTE — Telephone Encounter (Signed)
[facility] sent fax needing 3 month supply sent in for the testoterone medication.

## 2020-12-23 NOTE — Progress Notes (Signed)
Phone: [phone]   Subjective:  Patient presents today for their annual physical. Chief complaint-noted.   See problem oriented charting- Review of Systems  Constitutional:  Negative for chills and fever.  HENT:  Negative for ear discharge, ear pain (some left eustachian tube discomfort) and nosebleeds.   Eyes:  Negative for blurred vision and double vision.  Respiratory:  Positive for cough (rare intermittent). Negative for shortness of breath.   Cardiovascular:  Negative for chest pain and palpitations.  Gastrointestinal:  Negative for blood in stool, constipation, heartburn, nausea and vomiting.  Genitourinary:  Positive for urgency (occasional with diuretic). Negative for dysuria.  Musculoskeletal:  Positive for back pain, falls and myalgias.  Skin:  Positive for itching (under compressoin stockings only). Negative for rash.  Neurological:  Negative for dizziness and headaches.  Endo/Heme/Allergies:  Negative for polydipsia. Bruises/bleeds easily.  Psychiatric/Behavioral:  Negative for depression and suicidal ideas.    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   Cataract    forming   Depressive disorder, not elsewhere classified    Esophageal reflux    past hx    HTN (hypertension)    Lymphocytic colitis    Neuromuscular disorder (HCC)    neuropathy   Neuropathy    Other testicular hypofunction    Palpitations    Personal history of urinary calculi    Patient Active Problem List   Diagnosis Date Noted   Chronic pain syndrome 08/31/2016    Priority: High   History of lung cancer 12/11/2012    Priority: High   Aortic atherosclerosis ([place]) 11/03/2020    Priority: Medium   History of adenomatous polyp of colon 03/24/2018    Priority: Medium   Generalized anxiety  disorder 03/12/2014    Priority: Medium   Essential hypertension 03/12/2014    Priority: Medium   Peripheral neuropathy, idiopathic 12/22/2012    Priority: Medium   Dyspnea 07/14/2012    Priority: Medium   Hypertriglyceridemia 07/14/2012    Priority: Medium   Depression 02/16/2010    Priority: Medium   Low testosterone 11/21/2007    Priority: Medium   Chronic bilateral low back pain 06/05/2019    Priority: Low   Venous stasis of both lower extremities 06/05/2019    Priority: Low   Mastoiditis 03/09/2017    Priority: Low   Palpitations 08/11/2016    Priority: Low   Eustachian tube dysfunction 12/17/2014    Priority: Low   SI (sacroiliac) joint dysfunction 09/19/2013    Priority: Low   Nonspecific abnormal electrocardiogram (ECG) (EKG) 07/14/2012    Priority: Low   Allergic reaction, history of 10/25/2011    Priority: Low   Rectal or anal pain 10/01/2010    Priority: Low   Allergic rhinitis 06/13/2007    Priority: Low   GERD 06/13/2007    Priority: Low   IBS (irritable bowel syndrome) 06/13/2007    Priority: Low   Nonallopathic lesion of sacral region 09/19/2013   Nonallopathic lesion of thoracic region 09/19/2013   Nonallopathic lesion of lumbosacral region 09/19/2013   Past Surgical History:  Procedure Laterality Date   COLONOSCOPY     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  6-11   [name]   parotid tumor surgery  TONSILLECTOMY AND ADENOIDECTOMY      Family History  Problem Relation Age of Onset   Hypertension Mother    Anxiety disorder Mother    Non-Hodgkin's lymphoma Mother    Dementia Father    Depression Father        and anxiety   Hypertension Father    Sudden death Paternal Uncle 5   Colon cancer Paternal Uncle    Hypertension Brother    Early death Neg Hx    Heart disease Neg Hx        mother in 60s had lesoin   Colon polyps Neg Hx    Esophageal cancer Neg Hx    Rectal cancer Neg Hx    Stomach cancer Neg Hx      Medications- reviewed and updated Current Outpatient Medications  Medication Sig Dispense Refill   Acetylcarnitine HCl (ACETYL L-CARNITINE PO) Take by mouth.     Ascorbic Acid (VITAMIN C) 1000 MG tablet      atorvastatin (LIPITOR) 40 MG tablet Take 1 tablet (40 mg total) by mouth daily. 90 tablet 3   b complex vitamins tablet Take 1 tablet by mouth daily.     buPROPion (WELLBUTRIN XL) 300 MG 24 hr tablet Take 1 tablet (300 mg total) by mouth daily. 90 tablet 3   chlorthalidone (HYGROTON) 25 MG tablet Take 0.5-1 tablets (12.5-25 mg total) by mouth daily. 90 tablet 3   Cholecalciferol (VITAMIN D) 2000 UNITS CAPS Take 1 capsule by mouth daily.     clonazePAM (KLONOPIN) 0.5 MG tablet Take 1 tablet (0.5 mg total) by mouth 2 (two) times daily as needed (do not drive for 12 hours after taking). for anxiety 180 tablet 1   Coenzyme Q10 (CO Q 10 PO) Take 1 capsule by mouth daily.     COVID-19 mRNA Vac-TriS, Pfizer, (PFIZER-BIONT COVID-19 VAC-TRIS) SUSP injection Inject into the muscle. 0.3 mL 0   diltiazem (CARDIZEM CD) 180 MG 24 hr capsule Take 1 capsule (180 mg total) by mouth daily. 90 capsule 3   EPINEPHrine 0.3 mg/0.3 mL IJ SOAJ injection INJECT 1 SYRINGE INTO THE MUSCLE AS NEEDED FOR ANAPHYLAXIS 2 each 1   gabapentin (NEURONTIN) 300 MG capsule Take 2 capsules (600 mg total) by mouth at bedtime. 180 capsule 3   HYDROcodone-acetaminophen (NORCO/VICODIN) 5-325 MG tablet Take 0.5-1 tablets by mouth 2 (two) times daily as needed (No more than 1-3x a week use. 3 month supply). 55 tablet 0   MAGNESIUM PO Take 1 capsule by mouth daily.     NEEDLE, DISP, 22 G 22G X 1" MISC Used to inject testosterone weekly 50 each 2   potassium chloride (KLOR-CON) 10 MEQ tablet TAKE 2 TABLETS BY MOUTH  TWICE DAILY 120 tablet 11   Probiotic Product (PROBIOTIC BLEND PO)      Saw Palmetto, [place] repens, (SAW PALMETTO PO) Take 1 capsule by mouth 2 (two) times daily.     Syringe, Disposable, 1 ML MISC USE AS DIRECTED  ONCE A WEEK WITH TESTOSTERONE 25 each 1   testosterone cypionate (DEPOTESTOSTERONE CYPIONATE) 200 MG/ML injection INJECT INTRAMUSCULARLY  0.3ML WEEKLY (DISCARD  UNUSED PORTION AFTER FIRST  USE) 3 mL 3   Zinc 15 MG CAPS Take by mouth.     sildenafil (REVATIO) 20 MG tablet Take 1-5 tablets as needed once every 48 hours for erectile dysfunction (Patient not taking: No sig reported) 50 tablet 3   No current facility-administered medications for this visit.    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 Married 30 + years. No children, lots of critters.       Cancer Survivor- carcinoid lung cancer.   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 Martin Majestic back to work for ability to be insured.       Hobbies: music, home repair/improvement, cats   Objective  Objective:  BP 120/69   Pulse 65   Temp 97.9 F (36.6 C) (Temporal)   Ht 5\' 8"  (1.727 m)   Wt 180 lb (81.6 kg)   SpO2 97%   BMI 27.37 kg/m  Gen: NAD, resting comfortably HEENT: Mucous membranes are moist. Oropharynx normal Neck: no thyromegaly CV: RRR no murmurs rubs or gallops Lungs: CTAB no crackles, wheeze, rhonchi Abdomen: soft/nontender/nondistended/normal bowel sounds. No rebound or guarding.  Ext: trace edema with some varicosities and venous stasis changes lower leg Skin: warm, dry Neuro: grossly normal, moves all extremities, PERRLA    Assessment and Plan  70 y.o. male presenting for annual physical.  Health Maintenance counseling: 1. Anticipatory guidance: Patient counseled regarding regular dental exams -q6 months, eye exams -yearly,  avoiding smoking and  second hand smoke , limiting alcohol to 2 beverages per day -typically 1-2 drinks per month, no illicit drugs.   2. Risk factor reduction:  Advised patient of need for regular exercise and diet rich and fruits and vegetables to reduce risk of heart attack and stroke. Exercise- in the past he was doing 5 days a week -biking or treadmill- 8k steps a day now with kitchen remodel. Diet-tried to eat a healthy diet - mainly mediterranean in the past.  Wt Readings from Last 3 Encounters:  12/23/20 180 lb (81.6 kg)  11/03/20 181 lb 3.5 oz (82.2 kg)  06/17/20 181 lb 3.2 oz (82.2 kg)  3. Immunizations/screenings/ancillary studies -discussed [place], 5th COVID-19 booster-considering today- recommended omicron booster at pharmacy, and Flu vaccination-considering in october - otherwise up-to-date- may do TDap early by a few months - has party in October with 20 health care professionals Immunization History  Administered Date(s) Administered   Fluad Quad(high Dose 65+) 01/16/2019, 01/28/2020   Influenza Whole 01/04/2012   Influenza, High Dose Seasonal PF 12/21/2017   Influenza,inj,Quad PF,6+ Mos 12/17/2014   Influenza-Unspecified 01/17/2014, 12/28/2015, 01/01/2017   PFIZER Comirnaty(Gray Top)Covid-19 Tri-Sucrose Vaccine 08/18/2020   PFIZER(Purple Top)SARS-COV-2 Vaccination 04/19/2019, 05/14/2019, 12/31/2019   Pneumococcal Conjugate-13 06/17/2015   Pneumococcal Polysaccharide-23 09/07/2016   Td 02/27/2001, 04/10/2011   Zoster, Live 08/24/2010  4. Prostate cancer screening- low risk PSA trend on labs Lab Results  Component Value Date   PSA 0.26 11/12/2020   PSA 0.26 02/14/2020   PSA 0.22 02/05/2019   5. Colon cancer screening - last 03/15/18 with a 3-year repeat planned. History of adenomatous polyp of the colon with planned repeat December 2022-due later this year 6. Skin cancer screening- sees dermatology yearly in general- needs to schedule. advised regular sunscreen use. Denies  worrisome, changing, or new skin lesions.  7. Smoking associated screening (lung cancer screening, AAA screen 65-75, UA)-never smoker- see below about lung cancer 8. STD screening - opts out as monogamous  Status of chronic  or acute concerns   #Social update-Doing some prn work sparingly- folks out with covid- last worked 2 weeks ago  #discoloration of lower legs- dermatology said venous stasis skin changes- recommended compression stockings- he likes these but causes rather intense itching over time- encouraged to restart- if really bothering him- can refer to vascular surgery- he would liek to do so  #History of lung cancer/actually carcinoid tumor malignant-we follow an annual chest x-ray on him. Last time March 2021-later with CT chest abdomen pelvis 03/05/2020-not due for repeat at this time   #Chronic pain syndrome/neuropathy S: Medication: Gabapentin 600 mg at bedtime- occasionally second dose if worse that day , sparing hydrocodone twice a week. He knows to space hydrocodone and clonazepam at least 12 hours  -Does stationary bike for exercise- occasional treadmill -Last visit was experiencing some back pain-lifting seems to bother him- still having some of this- redid flooring and flared up the back -balance slightly worse with neuropathy - has had a few falls doing remodel when low to ground- no significant falls A/P: still with imperfect control can refill hydrocodone when due but not quite due yet- about 2 a week  (this visit will stand as visit for refill but too soon at this time) -PDMP reviewed and low risk  -UDS last updated 06/17/20-not yet due -Controlled substance contract on file from4/08/2017  #hypertension/hypokalemia requiring potassium 20 mEq twice daily  S: medication: Chlorthalidone 25 mg daily (amlodipine caused edema in past). Some elevations in the past and have considered increasing diltiazem to 240 mg - but HR into 40s at times- so would avoid- not having  symptoms Home readings #s: similar readings at home BP Readings from Last 3 Encounters:  12/23/20 120/69  11/03/20 120/70  06/17/20 128/72  A/P: Controlled. Continue current medications.   #Hypertriglyceridemia/aortic atherosclerosis on the other hand with coronary calcium score of 0 on 07/08/2020 S: Medication:Atorvastatin 40 mg (takes only 20mg ) daily. Had microvascular ischemia on prior MRI and will need to be more proactive Lab Results  Component Value Date   CHOL 116 11/12/2020   HDL 46.50 11/12/2020   LDLCALC 57 11/12/2020   TRIG 60.0 11/12/2020   CHOLHDL 2 11/12/2020  A/P: LDL and triglycerides very well controlled on last check for someone with aortic atherosclerosis and microvascular ischemia-continue current medication  # Depression/GAD S: Medication:Wellbutrin 300 mg daily . Uses clonazepam 1-2 times a day-we have discussed trying to cut back. Has not tolerated SSRIs due to palpitations -Has had some falls in the last 6 months and discussed wanting to try back clonazepam due to fall risk- but these were with remodel and low to ground- low risk- hold off on repeat  Depression screen [facility] 2/9 11/03/2020 06/17/2020 02/14/2020  Decreased Interest 0 0 1  Down, Depressed, Hopeless 0 0 3  PHQ - 2 Score 0 0 4  Altered sleeping 3 0 2  Tired, decreased energy 0 0 3  Change in appetite 3 0 0  Feeling bad or failure about yourself  0 0 0  Trouble concentrating 0 1 1  Moving slowly or fidgety/restless 0 0 1  Suicidal thoughts 0 0 0  PHQ-9 Score 6 1 11   Difficult doing work/chores Not difficult at all Not difficult at all Somewhat difficult  Some recent data might be hidden  A/P: depresison /anxiety Maralyn Sago stable/well controlled- continue current meds  #Hypogonadism-on testosterone treatment with Dr. Dwyane Dee still.  Hemoglobin slightly high at times but not on last check- has another check with him  before next visit Lab Results  Component Value Date   WBC 6.0 11/12/2020   HGB 16.0  11/12/2020   HCT 46.2 11/12/2020   MCV 93.9 11/12/2020   PLT 232.0 11/12/2020   #vitamin D deficiency-on 2000 units a daily and levels trending up-could take simply 1000 units a day- may wait on this until next visit   Recommended follow up: Return in about 4 months (around 04/24/2021) for a ollow-up or sooner if needed. Future Appointments  Date Time Provider [place]  02/10/2021  8:15 AM [facility] None  02/13/2021  8:15 AM Elayne Snare, MD [facility] None   Lab/Order associations:already did labs- no labs today. Get CMP - not fasting   ICD-10-CM   1. Preventative health care  Z00.00     2. Hypertriglyceridemia  E78.1     3. Essential hypertension  I10 Comprehensive metabolic panel    4. Recurrent major depressive disorder, in partial remission ([place])  F33.41     5. Aortic atherosclerosis (HCC) Chronic I70.0     6. Venous stasis  I87.8 Ambulatory referral to Vascular Surgery      No orders of the defined types were placed in this encounter.  I,Harris Phan,acting as a Education administrator for [name], MD.,have documented all relevant documentation on the behalf of [name], MD,as directed by  [name], MD while in the presence of [name], MD.  I, [name], MD, have reviewed all documentation for this visit. The documentation on 12/23/20 for the exam, diagnosis, procedures, and orders are all accurate and complete.   Return precautions advised.  [name], MD

## 2020-12-23 NOTE — Patient Instructions (Addendum)
Health Maintenance Due  Topic Date Due   Zoster Vaccines- Shingrix (1 of 2)  Please check with your pharmacy to see if they have the shingrix vaccine. If they do- please get this immunization and update [place] by phone call or mychart with dates you receive the vaccine.  Never done   INFLUENZA VACCINE   Consider this in October.  11/03/2020   COVID-19 Vaccine (5 - Booster for [employer] series)  Recommend getting Omicron variant specific booster only at your local pharmacy. Please let us know when you have received this.  12/19/2020   Please stop by lab before you go If you have mychart- we will send your results within 3 business days of [place] receiving them.  If you do not have mychart- we will call you about results within 5 business days of [place] receiving them.  *please also note that you will see labs on mychart as soon as they post. I will later go in and write notes on them- will say "notes from Dr. Yong Channel"  Please plan to schedule a visit with Dermatology soon.  I encourage you to start wearing you compression stockings again if you tolerate them  We will call you within two weeks about your referral to Vascular Surgery. If you do not hear within 2 weeks, give [place] a call.   Recommended follow up: Return in about 4 months (around 04/24/2021) for a ollow-up or sooner if needed.

## 2020-12-23 NOTE — Progress Notes (Signed)
   Covid-19 Vaccination Clinic  Name:  [name] Page    MRN: [id number] DOB: [birth year]  12/23/2020  [name] was observed post Covid-19 immunization for 15 minutes without incident. He was provided with Vaccine Information Sheet and instruction to access the V-Safe system.   [name] was instructed to call 911 with any severe reactions post vaccine: Difficulty breathing  Swelling of face and throat  A fast heartbeat  A bad rash all over body  Dizziness and weakness

## 2020-12-24 NOTE — Telephone Encounter (Signed)
Pt called and said he doesn't need a refill. However, unsure about the quantity he has currently and will reach out through [place]. Any questions please call patient [phone]

## 2020-12-25 ENCOUNTER — Encounter: Payer: Self-pay | Admitting: Gastroenterology

## 2020-12-25 ENCOUNTER — Encounter: Payer: Self-pay | Admitting: Family Medicine

## 2020-12-25 NOTE — Telephone Encounter (Signed)
No refill sent

## 2020-12-26 ENCOUNTER — Other Ambulatory Visit: Payer: Self-pay

## 2020-12-26 DIAGNOSIS — I878 Other specified disorders of veins: Secondary | ICD-10-CM

## 2020-12-26 MED ORDER — POTASSIUM CHLORIDE ER 10 MEQ PO TBCR: EXTENDED_RELEASE_TABLET | ORAL | 3 refills | Status: DC

## 2021-01-02 ENCOUNTER — Other Ambulatory Visit: Payer: Self-pay

## 2021-01-02 DIAGNOSIS — I872 Venous insufficiency (chronic) (peripheral): Secondary | ICD-10-CM

## 2021-01-16 ENCOUNTER — Encounter: Payer: Self-pay | Admitting: Vascular Surgery

## 2021-01-16 ENCOUNTER — Ambulatory Visit: Payer: Medicare Other | Admitting: Vascular Surgery

## 2021-01-16 ENCOUNTER — Ambulatory Visit (HOSPITAL_COMMUNITY)
Admission: RE | Admit: 2021-01-16 | Discharge: 2021-01-16 | Disposition: A | Discharge status: Home / Self Care | Payer: Medicare Other | Source: Ambulatory Visit | Attending: Vascular Surgery | Admitting: Vascular Surgery

## 2021-01-16 ENCOUNTER — Other Ambulatory Visit: Payer: Self-pay

## 2021-01-16 VITALS — BP 133/82 | HR 66 | Temp 98.3°F | Resp 16 | Ht 68.0 in | Wt 180.0 lb

## 2021-01-16 DIAGNOSIS — I872 Venous insufficiency (chronic) (peripheral): Secondary | ICD-10-CM | POA: Insufficient documentation

## 2021-01-16 NOTE — Progress Notes (Signed)
Office Note     CC: Venous stasis Requesting Provider:  Marin Olp, MD  HPI: [name] Page is a 70 y.o. ([birth year]) male who presents at the request of Marin Olp, MD for evaluation of bilateral lower extremity venous stasis.  Who presents to clinic today with a several year history of spider veins, mild varicosities.  Most recently, he began compression stockings and appreciated a rash versus telangiectasia at the site of compression.    Exam, [name] Page was doing well.  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  While [name] Page does appreciate some itching, swelling, heaviness, tired feeling intermittently, he states neither leg has lifestyle limiting symptoms.  He denies bleeding, ulceration.  Usually smaller in the morning.  He initially attempted compression socks but stopped after noting rash versus telangiectasia.  These have since resolved [name] Page's lower extremity symptoms did not require the use of medications.  He has no history of previous vein procedures.  He has no history of DVT.  The pt is on a statin for cholesterol management.  The pt is not on a daily aspirin.   Other AC:  - The pt is on medications for hypertension.   The pt is not diabetic.   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  6-11   [name] Page    parotid tumor surgery     TONSILLECTOMY AND ADENOIDECTOMY      Social History   Socioeconomic History   Marital status: Married    Spouse name: Not on file   Number of children: 0   Years of education: 18   Highest education level: Not on file  Occupational History   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 Married 30 + years. No children, lots of critters.       Cancer Survivor- carcinoid lung cancer.   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 [name] Page back to work for ability to be insured.       Hobbies: music, home repair/improvement, cats   Social Determinants  of Health   Financial Resource Strain: Not on file  Food Insecurity: Not on file  Transportation Needs: Not on file  Physical Activity: Not on file  Stress: Not on file  Social Connections: Not on file  Intimate Partner Violence: Not on file    Family History  Problem Relation Age of Onset   Hypertension Mother    Anxiety disorder Mother    Non-Hodgkin's lymphoma Mother    Dementia Father    Depression Father        and anxiety   Hypertension Father    Sudden death Paternal Uncle 42   Colon cancer Paternal Uncle    Hypertension Brother    Early death Neg Hx    Heart disease Neg Hx        mother in 39s had lesoin   Colon polyps Neg Hx    Esophageal cancer Neg Hx    Rectal cancer Neg Hx    Stomach cancer Neg Hx     Current Outpatient Medications  Medication  Sig Dispense Refill   Acetylcarnitine HCl (ACETYL L-CARNITINE PO) Take by mouth.     Ascorbic Acid (VITAMIN C) 1000 MG tablet      atorvastatin (LIPITOR) 40 MG tablet Take 1 tablet (40 mg total) by mouth daily. 90 tablet 3   b complex vitamins tablet Take 1 tablet by mouth daily.     buPROPion (WELLBUTRIN XL) 300 MG 24 hr tablet Take 1 tablet (300 mg total) by mouth daily. 90 tablet 3   chlorthalidone (HYGROTON) 25 MG tablet Take 0.5-1 tablets (12.5-25 mg total) by mouth daily. 90 tablet 3   Cholecalciferol (VITAMIN D) 2000 UNITS CAPS Take 1 capsule by mouth daily.     clonazePAM (KLONOPIN) 0.5 MG tablet Take 1 tablet (0.5 mg total) by mouth 2 (two) times daily as needed (do not drive for 12 hours after taking). for anxiety 180 tablet 1   Coenzyme Q10 (CO Q 10 PO) Take 1 capsule by mouth daily.     COVID-19 mRNA bivalent vaccine, Pfizer, (PFIZER COVID-19 VAC BIVALENT) injection Inject into the muscle. 0.3 mL 0   COVID-19 mRNA Vac-TriS, Pfizer, (PFIZER-BIONT COVID-19 VAC-TRIS) SUSP injection Inject into the muscle. 0.3 mL 0   diltiazem (CARDIZEM CD) 180 MG 24 hr capsule Take 1 capsule (180 mg total) by mouth daily. 90 capsule 3   EPINEPHrine 0.3 mg/0.3 mL IJ SOAJ injection INJECT 1 SYRINGE INTO THE MUSCLE AS NEEDED FOR ANAPHYLAXIS 2 each 1   gabapentin (NEURONTIN) 300 MG capsule Take 2 capsules (600 mg total) by mouth at bedtime. 180 capsule 3   HYDROcodone-acetaminophen (NORCO/VICODIN) 5-325 MG tablet Take 0.5-1 tablets by mouth 2 (two) times daily as needed (No more than 1-3x a week use. 3 month supply). 55 tablet 0   MAGNESIUM PO Take 1 capsule by mouth daily.     NEEDLE, DISP, 22 G 22G X 1" MISC Used to inject testosterone weekly 50 each 2   potassium chloride (KLOR-CON) 10 MEQ tablet TAKE 3 pills in the morning and 2 pills in the afternoon. 450 tablet 3   Probiotic Product (PROBIOTIC BLEND PO)      Saw Palmetto, Serenoa repens, (SAW PALMETTO PO) Take 1 capsule by mouth 2 (two) times daily.      sildenafil (REVATIO) 20 MG tablet Take 1-5 tablets as needed once every 48 hours for erectile dysfunction 50 tablet 3   Syringe, Disposable, 1 ML MISC USE AS DIRECTED ONCE A WEEK WITH TESTOSTERONE 25 each  1   testosterone cypionate (DEPOTESTOSTERONE CYPIONATE) 200 MG/ML injection INJECT INTRAMUSCULARLY  0.3ML WEEKLY (DISCARD  UNUSED PORTION AFTER FIRST  USE) 3 mL 3   Zinc 15 MG CAPS Take by mouth.     No current facility-administered medications for this visit.    Allergies  Allergen Reactions   Bee Venom Anaphylaxis   Flagyl [Metronidazole Hcl] Other (See Comments)    Neuropathy    Nsaids     REACTION: intolereance   Protonix [Pantoprazole]     Abdominal Pain and Diarrhea     REVIEW OF SYSTEMS:   [X]  denotes positive finding, [ ]  denotes negative finding Cardiac  Comments:  Chest pain or chest pressure:    Shortness of breath upon exertion:    Short of breath when lying flat:    Irregular heart rhythm:        Vascular    Pain in calf, thigh, or hip brought on by ambulation:    Pain in feet at night that wakes you up from your sleep:     Blood clot in your veins:    Leg swelling:  X Intermittently      Pulmonary    Oxygen at home:    Productive cough:     Wheezing:         Neurologic    Sudden weakness in arms or legs:     Sudden numbness in arms or legs:     Sudden onset of difficulty speaking or slurred speech:    Temporary loss of vision in one eye:     Problems with dizziness:         Gastrointestinal    Blood in stool:     Vomited blood:         Genitourinary    Burning when urinating:     Blood in urine:        Psychiatric    Major depression:         Hematologic    Bleeding problems:    Problems with blood clotting too easily:        Skin    Rashes or ulcers:        Constitutional    Fever or chills:      PHYSICAL EXAMINATION:  Vitals:   01/16/21 0953  BP: 133/82  Pulse: 66  Resp: 16  Temp: 98.3 F (36.8 C)  TempSrc: Temporal   SpO2: 96%  Weight: 180 lb (81.6 kg)  Height: 5\' 8"  (1.727 m)    General:  WDWN in NAD; vital signs documented above Gait: Not observed HENT: WNL, normocephalic Pulmonary: normal non-labored breathing , without Rales, rhonchi,  wheezing Cardiac: regular HR, Abdomen: soft, NT, no masses Skin: without rashes Vascular Exam/Pulses:  Right Left  Radial 2+ (normal) 2+ (normal)  Ulnar 2+ (normal) 2+ (normal)  Femoral    Popliteal    DP 2+ (normal) 2+ (normal)  PT 2+ (normal) 2+ (normal)   Extremities: without ischemic changes, without Gangrene , without cellulitis; without open wounds;  Musculoskeletal: no muscle wasting or atrophy  Neurologic: A&O X 3;  No focal weakness or paresthesias are detected Psychiatric:  The pt has Normal affect.   Non-Invasive Vascular Imaging:   Left lower extremity noninvasive venous ultrasonography was reviewed demonstrating reflux at the common femoral vein and patella vein.  The superficial system demonstrated no evidence of venous reflux.    ASSESSMENT/PLAN:: Patient is a 70 y.o. male presenting with spider veins and small varicose veins.  Symptoms  include itching, heaviness, swelling, but [name] Page does not describe these as lifestyle limiting. CEAP2. His main concern was the rash versus telangiectasias he appreciated when using compression socks.  This is since resolved and was not visible in clinic.  He would benefit from lower extremity compression as he does have mild deep venous reflux.  He remains relatively asymptomatic and follow-up if symptoms worsen. He was measured for compressions, and I showed him where he could purchase socks online. Was asked to call our office if any questions or concerns developed.   [name] John, MD [facility] [phone]

## 2021-01-19 ENCOUNTER — Encounter: Payer: Self-pay | Admitting: Family Medicine

## 2021-01-19 ENCOUNTER — Other Ambulatory Visit: Payer: Self-pay

## 2021-01-19 DIAGNOSIS — E876 Hypokalemia: Secondary | ICD-10-CM

## 2021-01-22 ENCOUNTER — Other Ambulatory Visit (INDEPENDENT_AMBULATORY_CARE_PROVIDER_SITE_OTHER): Payer: Medicare Other

## 2021-01-22 ENCOUNTER — Other Ambulatory Visit: Payer: Self-pay

## 2021-01-22 DIAGNOSIS — E876 Hypokalemia: Secondary | ICD-10-CM | POA: Diagnosis not present

## 2021-01-22 LAB — COMPREHENSIVE METABOLIC PANEL
ALT: 34 U/L (ref 0–53)
AST: 29 U/L (ref 0–37)
Albumin: 4.3 g/dL (ref 3.5–5.2)
Alkaline Phosphatase: 71 U/L (ref 39–117)
BUN: 17 mg/dL (ref 6–23)
CO2: 31 mEq/L (ref 19–32)
Calcium: 9.6 mg/dL (ref 8.4–10.5)
Chloride: 99 mEq/L (ref 96–112)
Creatinine, Ser: 1.04 mg/dL (ref 0.40–1.50)
GFR: 72.66 mL/min (ref >60.00)
Glucose, Bld: 136 mg/dL — ABNORMAL HIGH (ref 70–99)
Potassium: 3.2 mEq/L — ABNORMAL LOW (ref 3.5–5.1)
Sodium: 138 mEq/L (ref 135–145)
Total Bilirubin: 1 mg/dL (ref 0.2–1.2)
Total Protein: 6.8 g/dL (ref 6.0–8.3)

## 2021-01-27 ENCOUNTER — Encounter: Payer: Self-pay | Admitting: Family Medicine

## 2021-01-28 ENCOUNTER — Telehealth: Payer: Self-pay

## 2021-01-28 DIAGNOSIS — E876 Hypokalemia: Secondary | ICD-10-CM

## 2021-01-28 NOTE — Telephone Encounter (Addendum)
Labs ordered for BMP and magnesium per provider.

## 2021-01-28 NOTE — Telephone Encounter (Signed)
Pt labs have been ordered and pt will call [facility] and try and schedule lab appt there.

## 2021-01-29 ENCOUNTER — Other Ambulatory Visit (INDEPENDENT_AMBULATORY_CARE_PROVIDER_SITE_OTHER): Payer: Medicare Other

## 2021-01-29 DIAGNOSIS — E876 Hypokalemia: Secondary | ICD-10-CM

## 2021-01-29 DIAGNOSIS — E291 Testicular hypofunction: Secondary | ICD-10-CM

## 2021-01-29 DIAGNOSIS — D751 Secondary polycythemia: Secondary | ICD-10-CM

## 2021-01-29 DIAGNOSIS — R7301 Impaired fasting glucose: Secondary | ICD-10-CM

## 2021-01-29 LAB — BASIC METABOLIC PANEL
BUN: 15 mg/dL (ref 6–23)
CO2: 28 mEq/L (ref 19–32)
Calcium: 9.3 mg/dL (ref 8.4–10.5)
Chloride: 102 mEq/L (ref 96–112)
Creatinine, Ser: 1.01 mg/dL (ref 0.40–1.50)
GFR: 75.25 mL/min (ref >60.00)
Glucose, Bld: 94 mg/dL (ref 70–99)
Potassium: 3.6 mEq/L (ref 3.5–5.1)
Sodium: 138 mEq/L (ref 135–145)

## 2021-01-29 LAB — MAGNESIUM: Magnesium: 2.1 mg/dL (ref 1.5–2.5)

## 2021-02-09 ENCOUNTER — Other Ambulatory Visit (INDEPENDENT_AMBULATORY_CARE_PROVIDER_SITE_OTHER): Payer: Medicare Other

## 2021-02-09 ENCOUNTER — Other Ambulatory Visit: Payer: Self-pay

## 2021-02-09 DIAGNOSIS — R7301 Impaired fasting glucose: Secondary | ICD-10-CM

## 2021-02-09 DIAGNOSIS — D751 Secondary polycythemia: Secondary | ICD-10-CM | POA: Diagnosis not present

## 2021-02-09 DIAGNOSIS — I10 Essential (primary) hypertension: Secondary | ICD-10-CM | POA: Diagnosis not present

## 2021-02-09 DIAGNOSIS — E291 Testicular hypofunction: Secondary | ICD-10-CM | POA: Diagnosis not present

## 2021-02-09 LAB — CBC WITH DIFFERENTIAL/PLATELET
Basophils Absolute: 0 10*3/uL (ref 0.0–0.1)
Basophils Relative: 0.6 % (ref 0.0–3.0)
Eosinophils Absolute: 0.2 10*3/uL (ref 0.0–0.7)
Eosinophils Relative: 3.1 % (ref 0.0–5.0)
HCT: 46.4 % (ref 39.0–52.0)
Hemoglobin: 15.9 g/dL (ref 13.0–17.0)
Lymphocytes Relative: 29.6 % (ref 12.0–46.0)
Lymphs Abs: 1.8 10*3/uL (ref 0.7–4.0)
MCHC: 34.1 g/dL (ref 30.0–36.0)
MCV: 94.5 fl (ref 78.0–100.0)
Monocytes Absolute: 0.7 10*3/uL (ref 0.1–1.0)
Monocytes Relative: 11.8 % (ref 3.0–12.0)
Neutro Abs: 3.3 10*3/uL (ref 1.4–7.7)
Neutrophils Relative %: 54.9 % (ref 43.0–77.0)
Platelets: 196 10*3/uL (ref 150.0–400.0)
RBC: 4.91 Mil/uL (ref 4.22–5.81)
RDW: 12 % (ref 11.5–15.5)
WBC: 6.1 10*3/uL (ref 4.0–10.5)

## 2021-02-09 LAB — GLUCOSE, RANDOM: Glucose, Bld: 126 mg/dL — ABNORMAL HIGH (ref 70–99)

## 2021-02-09 LAB — TESTOSTERONE: Testosterone: 338.73 ng/dL (ref 300.00–890.00)

## 2021-02-09 LAB — HEMOGLOBIN A1C: Hgb A1c MFr Bld: 5.6 % (ref 4.6–6.5)

## 2021-02-10 ENCOUNTER — Other Ambulatory Visit: Payer: Self-pay

## 2021-02-10 ENCOUNTER — Ambulatory Visit (AMBULATORY_SURGERY_CENTER): Payer: Medicare Other | Admitting: *Deleted

## 2021-02-10 ENCOUNTER — Other Ambulatory Visit: Payer: Medicare Other

## 2021-02-10 ENCOUNTER — Other Ambulatory Visit: Payer: Self-pay | Admitting: Endocrinology

## 2021-02-10 ENCOUNTER — Encounter: Payer: Self-pay | Admitting: Gastroenterology

## 2021-02-10 VITALS — Ht 68.0 in | Wt 174.0 lb

## 2021-02-10 DIAGNOSIS — Z86010 Personal history of colonic polyps: Secondary | ICD-10-CM

## 2021-02-10 DIAGNOSIS — I10 Essential (primary) hypertension: Secondary | ICD-10-CM

## 2021-02-10 LAB — BASIC METABOLIC PANEL
BUN: 15 mg/dL (ref 6–23)
CO2: 23 mEq/L (ref 19–32)
Calcium: 9.3 mg/dL (ref 8.4–10.5)
Chloride: 105 mEq/L (ref 96–112)
Creatinine, Ser: 0.98 mg/dL (ref 0.40–1.50)
GFR: 78 mL/min (ref >60.00)
Glucose, Bld: 126 mg/dL — ABNORMAL HIGH (ref 70–99)
Potassium: 4.2 mEq/L (ref 3.5–5.1)
Sodium: 140 mEq/L (ref 135–145)

## 2021-02-10 NOTE — Progress Notes (Signed)

## 2021-02-11 DIAGNOSIS — H524 Presbyopia: Secondary | ICD-10-CM | POA: Diagnosis not present

## 2021-02-11 DIAGNOSIS — H43813 Vitreous degeneration, bilateral: Secondary | ICD-10-CM | POA: Diagnosis not present

## 2021-02-11 DIAGNOSIS — H25043 Posterior subcapsular polar age-related cataract, bilateral: Secondary | ICD-10-CM | POA: Diagnosis not present

## 2021-02-11 DIAGNOSIS — H2513 Age-related nuclear cataract, bilateral: Secondary | ICD-10-CM | POA: Diagnosis not present

## 2021-02-13 ENCOUNTER — Encounter: Payer: Self-pay | Admitting: Endocrinology

## 2021-02-13 ENCOUNTER — Telehealth (INDEPENDENT_AMBULATORY_CARE_PROVIDER_SITE_OTHER): Payer: Medicare Other | Admitting: Endocrinology

## 2021-02-13 ENCOUNTER — Other Ambulatory Visit: Payer: Self-pay

## 2021-02-13 VITALS — BP 152/82 | HR 56

## 2021-02-13 DIAGNOSIS — I10 Essential (primary) hypertension: Secondary | ICD-10-CM | POA: Diagnosis not present

## 2021-02-13 DIAGNOSIS — R7301 Impaired fasting glucose: Secondary | ICD-10-CM

## 2021-02-13 DIAGNOSIS — E291 Testicular hypofunction: Secondary | ICD-10-CM

## 2021-02-13 NOTE — Progress Notes (Signed)
Patient ID: [name] Page, male   DOB: [birth year], 70 y.o.   MRN: [id number]             Chief complaint: Endocrinology follow-up  I connected with the above-named patient by video enabled telemedicine application and verified that I am speaking with the correct person. The patient was explained the limitations of evaluation and management by telemedicine and the availability of in person appointments.  Patient also understood that there may be a patient responsible charge related to this service  Location of the patient: Patient's home  Location of the provider: Physician office Only the patient and myself were participating in the encounter The patient understood the above statements and agreed to proceed.   History of Present Illness   HYPOGONADISM:  Hypogonadism was diagnosed in ?  2012  He at that time had complaints of fatigue, some increased depression, decreased libido although he does not remember symptoms well There is no history of the following: Hot flushes, sweats, breast enlargement, long term anabolic steroid use, history of testicular injury, head injury or mumps in childhood.  No history of osteopenia or low impact fracture  At that time an afternoon testosterone level was 214 and free testosterone level low at 32, normal >47         He was also evaluated by a urologist and details are not available He was treated with testosterone gel but he thinks this did not improve his testosterone levels and likely did not continue treatment  Subsequently he was again evaluated in 2015 and with a low testosterone of 239 he was started on testosterone injections With this he thinks she had some improvement in his fatigue and depression His dose had been adjusted from his initial starting dose of 100 mg every 2 weeks and he has been on various regimens including every 3 or 4 weeks injections His testosterone level had been quite variable since about 2016  RECENT  history:  He has been taking 30 mg of testosterone weekly  Because of his increased hematocrit on his initial consultation he was changed to testosterone injections every week instead of every other week After initial adjustment of the dosage regimen he is taking the above dose consistently since 08/2018 He has been consistent with his weekly injection, recently on Wednesday  Overall he may have been a little more tired lately compared to before, has had symptoms of decreased libido  Testosterone level has been normal but appears to be gradually decreasing  Testosterone level was checked about 5 days after his injection  Hemoglobin has been stable in upper normal range  His lab results show testosterone levels as follows:   Lab Results  Component Value Date   TESTOSTERONE 338.73 02/09/2021   TESTOSTERONE 398.07 08/01/2020   TESTOSTERONE 478.06 11/28/2019   TESTOSTERONE 455.17 02/05/2019   Lab Results  Component Value Date   HGB 15.9 02/09/2021    Prolactin level: Normal   Lab Results  Component Value Date   LH <0.20 (L) 04/07/2017   Lab Results  Component Value Date   HGB 15.9 02/09/2021   HGB 16.0 11/12/2020   HGB 17.4 (H) 08/01/2020   HGB 17.5 (H) 02/14/2020              Allergies as of 02/13/2021       Reactions   Bee Venom Anaphylaxis   Flagyl [metronidazole Hcl] Other (See Comments)   Neuropathy    Nsaids    REACTION: intolereance- ABD PAIN AND  DIARRHEA    Protonix [pantoprazole]    Abdominal Pain and Diarrhea        Medication List        Accurate as of February 13, 2021  8:04 AM. If you have any questions, ask your nurse or doctor.          ACETYL L-CARNITINE PO Take by mouth.   atorvastatin 40 MG tablet Commonly known as: LIPITOR Take 1 tablet (40 mg total) by mouth daily.   b complex vitamins tablet Take 1 tablet by mouth daily.   buPROPion 300 MG 24 hr tablet Commonly known as: WELLBUTRIN XL Take 1 tablet (300 mg total) by  mouth daily.   clonazePAM 0.5 MG tablet Commonly known as: KLONOPIN Take 1 tablet (0.5 mg total) by mouth 2 (two) times daily as needed (do not drive for 12 hours after taking). for anxiety   CO Q 10 PO Take 1 capsule by mouth daily.   diltiazem 180 MG 24 hr capsule Commonly known as: CARDIZEM CD Take 1 capsule (180 mg total) by mouth daily.   EPINEPHrine 0.3 mg/0.3 mL Soaj injection Commonly known as: EPI-PEN INJECT 1 SYRINGE INTO THE MUSCLE AS NEEDED FOR ANAPHYLAXIS   gabapentin 300 MG capsule Commonly known as: NEURONTIN Take 2 capsules (600 mg total) by mouth at bedtime.   HYDROcodone-acetaminophen 5-325 MG tablet Commonly known as: NORCO/VICODIN Take 0.5-1 tablets by mouth 2 (two) times daily as needed (No more than 1-3x a week use. 3 month supply).   MAGNESIUM PO Take 1 capsule by mouth daily.   NEEDLE (DISP) 22 G 22G X 1" Misc Used to inject testosterone weekly   Pfizer COVID-19 Vac Bivalent injection Generic drug: COVID-19 mRNA bivalent vaccine (Pfizer) Inject into the muscle.   Pfizer-BioNT COVID-19 Vac-TriS Susp injection Generic drug: COVID-19 mRNA Vac-TriS (Pfizer) Inject into the muscle.   PROBIOTIC BLEND PO   SAW PALMETTO PO Take 1 capsule by mouth 2 (two) times daily.   Syringe (Disposable) 1 ML Misc USE AS DIRECTED ONCE A WEEK WITH TESTOSTERONE   testosterone cypionate 200 MG/ML injection Commonly known as: DEPOTESTOSTERONE CYPIONATE INJECT INTRAMUSCULARLY  0.3ML WEEKLY (DISCARD  UNUSED PORTION AFTER FIRST  USE)   vitamin C 1000 MG tablet   Vitamin D 50 MCG (2000 UT) Caps Take 1 capsule by mouth daily.   Zinc 15 MG Caps Take by mouth.        Allergies:  Allergies  Allergen Reactions   Bee Venom Anaphylaxis   Flagyl [Metronidazole Hcl] Other (See Comments)    Neuropathy    Nsaids     REACTION: intolereance- ABD PAIN AND DIARRHEA    Protonix [Pantoprazole]     Abdominal Pain and Diarrhea    Past Medical History:  Diagnosis Date    Allergic rhinitis, cause unspecified    MILD   Anxiety    Arthritis    OA   Cancer (HCC)    lung tumor - NO CHEMO NO RADIATION   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   Cataract    forming   Depressive disorder, not elsewhere classified    Diverticulitis    X 1 EPISODE   Esophageal reflux    past hx    HTN (hypertension)    Hyperlipidemia    CONTROLLED   Lymphocytic colitis    Neuromuscular disorder (HCC)    neuropathy   Neuropathy    Other testicular hypofunction    Palpitations    Personal history  of urinary calculi    LAST IN THE 80'S    Past Surgical History:  Procedure Laterality Date   COLONOSCOPY     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   [name]   parotid tumor surgery     [place]- [place]   POLYPECTOMY     TONSILLECTOMY AND ADENOIDECTOMY      Family History  Problem Relation Age of Onset   Hypertension Mother    Anxiety disorder Mother    Non-Hodgkin's lymphoma Mother    Dementia Father    Depression Father        and anxiety   Hypertension Father    Sudden death Paternal Uncle 46   Colon cancer Paternal Uncle    Hypertension Brother    Early death Neg Hx    Heart disease Neg Hx        mother in 60s had lesoin   Colon polyps Neg Hx    Esophageal cancer Neg Hx    Rectal cancer Neg Hx    Stomach cancer Neg Hx     Social History:  reports that he has never smoked. He has never used smokeless tobacco. He reports current alcohol use. He reports that he does not use drugs.  Review of Systems  IMPAIRED fasting glucose:   Although his A1c is normal usually his fasting glucose previously had been high  Fasting glucose on his glucose tolerance test done on 03/31/18 was 105 but 2-hour reading was only 103  Has not had any fasting labs for glucose recently  He is usually fairly consistent with eating healthy meals He has been able to exercise regularly and weight is improving  Wt Readings  from Last 3 Encounters:  02/10/21 174 lb (78.9 kg)  01/16/21 180 lb (81.6 kg)  12/23/20 180 lb (81.6 kg)   Lab Results  Component Value Date   GLUCOSE 126 (H) 02/10/2021   GLUCOSE 126 (H) 02/09/2021   GLUCOSE 94 01/29/2021   Lab Results  Component Value Date   HGBA1C 5.6 02/09/2021   HGBA1C 5.5 08/01/2020   HGBA1C 5.5 11/28/2019   Lab Results  Component Value Date   LDLCALC 57 11/12/2020   CREATININE 0.98 02/10/2021    General Examination:   There were no vitals taken for this visit.     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  He is on treatment with Depo-Testosterone once a week, continues to be on a stable dose of taking 30 mg weekly using 0.15 mL of the 200 mg/mL regimen  Subjectively has been doing well but he thinks he has had more fatigue lately Also has had other issues like hypokalemia causing fatigue  Testosterone level is again normal but gradually decreasing Testosterone level was checked about 5 days after injection   IMPAIRED fasting glucose with normal A1c   A1c levels now consistently normal However not clear if he has any high fasting readings, currently nonfasting readings are below 140  LIPIDS: Normal as of 8/22  Plan: He will try 40 instead of 30 mg testosterone injections weekly using the 200 mg/mL preparation  He can have his hematocrit checked again with PCP in January at the upcoming visit along with follow-up testosterone level Also have fasting glucose check   Follow-up in 6 months  Cailen Mihalik 02/13/2021, 8:04 AM     Note: This office note was prepared with Dragon voice recognition system technology. Any transcriptional errors that  result from this process are unintentional.

## 2021-02-17 ENCOUNTER — Encounter: Payer: Self-pay | Admitting: Family Medicine

## 2021-02-18 ENCOUNTER — Other Ambulatory Visit: Payer: Self-pay

## 2021-02-18 ENCOUNTER — Other Ambulatory Visit (HOSPITAL_BASED_OUTPATIENT_CLINIC_OR_DEPARTMENT_OTHER): Payer: Self-pay

## 2021-02-18 MED ORDER — HYDROCODONE-ACETAMINOPHEN 5-325 MG PO TABS
0.5000 | ORAL_TABLET | Freq: Two times a day (BID) | ORAL | 0 refills | Status: DC | PRN
  Filled 2021-02-18: qty 55, 28d supply, fill #0

## 2021-02-18 NOTE — Telephone Encounter (Signed)
Refill request sent to provider.

## 2021-02-18 NOTE — Progress Notes (Signed)
Last Refill 11/03/2020 Last OV 12/23/2020 dx preventative health care

## 2021-02-23 ENCOUNTER — Ambulatory Visit (AMBULATORY_SURGERY_CENTER): Payer: Medicare Other | Admitting: Gastroenterology

## 2021-02-23 ENCOUNTER — Encounter: Payer: Self-pay | Admitting: Gastroenterology

## 2021-02-23 ENCOUNTER — Other Ambulatory Visit: Payer: Self-pay | Admitting: Gastroenterology

## 2021-02-23 VITALS — BP 119/68 | HR 66 | Temp 98.2°F | Resp 14 | Ht 68.0 in | Wt 178.0 lb

## 2021-02-23 DIAGNOSIS — Z86010 Personal history of colonic polyps: Secondary | ICD-10-CM

## 2021-02-23 DIAGNOSIS — R197 Diarrhea, unspecified: Secondary | ICD-10-CM

## 2021-02-23 DIAGNOSIS — K635 Polyp of colon: Secondary | ICD-10-CM

## 2021-02-23 DIAGNOSIS — K6389 Other specified diseases of intestine: Secondary | ICD-10-CM | POA: Diagnosis not present

## 2021-02-23 DIAGNOSIS — K648 Other hemorrhoids: Secondary | ICD-10-CM | POA: Diagnosis not present

## 2021-02-23 DIAGNOSIS — E785 Hyperlipidemia, unspecified: Secondary | ICD-10-CM | POA: Diagnosis not present

## 2021-02-23 DIAGNOSIS — D124 Benign neoplasm of descending colon: Secondary | ICD-10-CM

## 2021-02-23 MED ORDER — SODIUM CHLORIDE 0.9 % IV SOLN: 500.0000 mL | Freq: Once | INTRAVENOUS | Status: DC

## 2021-02-23 NOTE — Op Note (Signed)
[facility] Patient Name: [name] Page Procedure Date: 02/23/2021 11:06 AM MRN: [id number] Endoscopist: Remo Lipps P. Havery Moros , MD Age: 70 Referring MD:  Date of Birth: [birth year] Gender: Male Account #: [id number] Procedure:                Colonoscopy Indications:              High risk colon cancer surveillance: Personal                            history of colonic polyps (3 small adenomas removed                            03/2018) - patient with a history of lymphocytic                            colitis which was not active on last colonoscopy.                            Has since endorsed persistence of loose stools. Medicines:                Monitored Anesthesia Care Procedure:                Pre-Anesthesia Assessment:                           - Prior to the procedure, a History and Physical                            was performed, and patient medications and                            allergies were reviewed. The patient's tolerance of                            previous anesthesia was also reviewed. The risks                            and benefits of the procedure and the sedation                            options and risks were discussed with the patient.                            All questions were answered, and informed consent                            was obtained. Prior Anticoagulants: The patient has                            taken no previous anticoagulant or antiplatelet                            agents. ASA Grade Assessment: III - A patient with  severe systemic disease. After reviewing the risks                            and benefits, the patient was deemed in                            satisfactory condition to undergo the procedure.                           After obtaining informed consent, the colonoscope                            was passed under direct vision. Throughout the                             procedure, the patient's blood pressure, pulse, and                            oxygen saturations were monitored continuously. The                            CF [id number] #[id number] was introduced through the anus                            and advanced to the the terminal ileum, with                            identification of the appendiceal orifice and IC                            valve. The colonoscopy was performed without                            difficulty. The patient tolerated the procedure                            well. The quality of the bowel preparation was                            good. The terminal ileum, ileocecal valve,                            appendiceal orifice, and rectum were photographed. Scope In: 11:26:28 AM Scope Out: 11:43:19 AM Scope Withdrawal Time: 0 hours 13 minutes 26 seconds  Total Procedure Duration: 0 hours 16 minutes 51 seconds  Findings:                 The perianal and digital rectal examinations were                            normal.                           The terminal ileum appeared normal.  A diminutive polyp was found in the descending                            colon. The polyp was sessile. The polyp was removed                            with a cold biopsy forceps. Resection and retrieval                            were complete.                           A few small-mouthed diverticula were found in the                            sigmoid colon.                           There was a lipoma, in the sigmoid colon.                           Internal hemorrhoids were found during                            retroflexion. The hemorrhoids were small.                           The exam was otherwise without abnormality.                           Biopsies for histology were taken with a cold                            forceps from the right colon, left colon and                            transverse colon for evaluation  of microscopic                            colitis given the patient's symptoms and history of                            microscopic colitis years ago. Complications:            No immediate complications. Estimated blood loss:                            Minimal. Estimated Blood Loss:     Estimated blood loss was minimal. Impression:               - The examined portion of the ileum was normal.                           - One diminutive polyp in the descending colon,  removed with a cold biopsy forceps. Resected and                            retrieved.                           - Diverticulosis in the sigmoid colon.                           - Lipoma in the sigmoid colon.                           - Internal hemorrhoids.                           - The examination was otherwise normal.                           - Biopsies were taken with a cold forceps from the                            right colon, left colon and transverse colon for                            evaluation of microscopic colitis. Recommendation:           - Patient has a contact number available for                            emergencies. The signs and symptoms of potential                            delayed complications were discussed with the                            patient. Return to normal activities tomorrow.                            Written discharge instructions were provided to the                            patient.                           - Resume previous diet.                           - Continue present medications.                           - Await pathology results. Remo Lipps P. Shammond Arave, MD 02/23/2021 11:47:40 AM This report has been signed electronically.

## 2021-02-23 NOTE — Patient Instructions (Signed)
Handouts on polyps and diverticulosis given to you today   YOU HAD AN ENDOSCOPIC PROCEDURE TODAY AT THE [facility]:   Refer to the procedure report that was given to you for any specific questions about what was found during the examination.  If the procedure report does not answer your questions, please call your gastroenterologist to clarify.  If you requested that your care partner not be given the details of your procedure findings, then the procedure report has been included in a sealed envelope for you to review at your convenience later.  YOU SHOULD EXPECT: Some feelings of bloating in the abdomen. Passage of more gas than usual.  Walking can help get rid of the air that was put into your GI tract during the procedure and reduce the bloating. If you had a lower endoscopy (such as a colonoscopy or flexible sigmoidoscopy) you may notice spotting of blood in your stool or on the toilet paper. If you underwent a bowel prep for your procedure, you may not have a normal bowel movement for a few days.  Please Note:  You might notice some irritation and congestion in your nose or some drainage.  This is from the oxygen used during your procedure.  There is no need for concern and it should clear up in a day or so.  SYMPTOMS TO REPORT IMMEDIATELY:  Following lower endoscopy (colonoscopy or flexible sigmoidoscopy):  Excessive amounts of blood in the stool  Significant tenderness or worsening of abdominal pains  Swelling of the abdomen that is new, acute  Fever of 100F or higher  For urgent or emergent issues, a gastroenterologist can be reached at any hour by calling [phone]. Do not use MyChart messaging for urgent concerns.    DIET:  We do recommend a small meal at first, but then you may proceed to your regular diet.  Drink plenty of fluids but you should avoid alcoholic beverages for 24 hours.  ACTIVITY:  You should plan to take it easy for the rest of today and you should  NOT DRIVE or use heavy machinery until tomorrow (because of the sedation medicines used during the test).    FOLLOW UP: Our staff will call the number listed on your records 48-72 hours following your procedure to check on you and address any questions or concerns that you may have regarding the information given to you following your procedure. If we do not reach you, we will leave a message.  We will attempt to reach you two times.  During this call, we will ask if you have developed any symptoms of COVID 19. If you develop any symptoms (ie: fever, flu-like symptoms, shortness of breath, cough etc.) before then, please call [phone].  If you test positive for Covid 19 in the 2 weeks post procedure, please call and report this information to us.    If any biopsies were taken you will be contacted by phone or by letter within the next 1-3 weeks.  Please call us at [phone] if you have not heard about the biopsies in 3 weeks.    SIGNATURES/CONFIDENTIALITY: You and/or your care partner have signed paperwork which will be entered into your electronic medical record.  These signatures attest to the fact that that the information above on your After Visit Summary has been reviewed and is understood.  Full responsibility of the confidentiality of this discharge information lies with you and/or your care-partner.  

## 2021-02-23 NOTE — Progress Notes (Signed)
[facility] History and Physical   Primary Care Physician:  Marin Olp, MD   Reason for Procedure:   History of colon polyps  Plan:    colonoscopy     HPI: [name] Page is a 70 y.o. male  here for colonoscopy surveillance - history of colon polyps, 3 adenomas removed 3 years ago. He has baseline loose stools that have worsened recently and history of lymphocytic colitis. No family history of colon cancer known. Otherwise feels well without any cardiopulmonary symptoms.    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   Cataract    forming   Depressive disorder, not elsewhere classified    Diverticulitis    X 1 EPISODE   Esophageal reflux    past hx    HTN (hypertension)    Hyperlipidemia    CONTROLLED   Lymphocytic colitis    Neuromuscular disorder (HCC)    neuropathy   Neuropathy    Other testicular hypofunction    Palpitations    Personal history of urinary calculi    LAST IN THE 80'S    Past Surgical History:  Procedure Laterality Date   COLONOSCOPY     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 Authorizing Provider  Acetylcarnitine HCl (ACETYL L-CARNITINE PO) Take by mouth.   Yes [provider]  Ascorbic Acid (VITAMIN C) 1000 MG tablet  04/05/20  Yes [provider]  atorvastatin (LIPITOR) 40 MG tablet Take 1 tablet (40 mg total) by mouth daily. 06/10/20  Yes Marin Olp, MD  b complex vitamins tablet Take 1 tablet by mouth daily.   Yes [provider]  buPROPion (WELLBUTRIN XL) 300 MG 24 hr tablet Take 1  tablet (300 mg total) by mouth daily. 06/10/20  Yes Marin Olp, MD  Cholecalciferol (VITAMIN D) 2000 UNITS CAPS Take 1 capsule by mouth daily.   Yes [provider]  clonazePAM (KLONOPIN) 0.5 MG tablet Take 1 tablet (0.5 mg total) by mouth 2 (two) times daily as needed (do not drive for 12 hours after taking). for anxiety 12/09/20  Yes Marin Olp, MD  Coenzyme Q10 (CO Q 10 PO) Take 1 capsule by mouth daily.   Yes [provider]  diltiazem (CARDIZEM CD) 180 MG 24 hr capsule Take 1 capsule (180 mg total) by mouth daily. 06/10/20  Yes Marin Olp, MD  gabapentin (NEURONTIN) 300 MG capsule Take 2 capsules (600 mg total) by mouth at bedtime. 06/10/20  Yes Marin Olp, MD  HYDROcodone-acetaminophen (NORCO/VICODIN) 5-325 MG tablet Take 0.5-1 tablets by mouth 2 (two) times daily as needed. Do not use indicated instructions more than one to three times per week. 02/18/21  Yes Marin Olp, MD  MAGNESIUM PO Take 1 capsule by mouth daily.   Yes [provider]  NEEDLE, DISP, 22 G 22G X 1" MISC Used to inject testosterone weekly 10/31/20  Yes Elayne Snare, MD  Probiotic Product (PROBIOTIC BLEND PO)  04/05/20  Yes [provider]  Saw Palmetto, Serenoa repens, (SAW PALMETTO PO) Take 1 capsule by mouth 2 (two) times daily.   Yes [provider]  Syringe, Disposable, 1 ML MISC USE AS DIRECTED ONCE A WEEK WITH TESTOSTERONE 08/08/20 08/08/21 Yes Elayne Snare, MD  testosterone cypionate (DEPOTESTOSTERONE CYPIONATE) 200 MG/ML injection INJECT INTRAMUSCULARLY  0.3ML WEEKLY (DISCARD  UNUSED PORTION AFTER FIRST  USE) 12/11/20  Yes Elayne Snare, MD  Zinc 15 MG CAPS Take by mouth.   Yes [provider]  COVID-19 mRNA bivalent vaccine, Pfizer, (PFIZER COVID-19 VAC BIVALENT) injection Inject into the muscle. 12/23/20   Carlyle Basques, MD  COVID-19 mRNA Vac-TriS, Pfizer, (PFIZER-BIONT COVID-19 VAC-TRIS) SUSP injection Inject into the muscle. 08/18/20   Carlyle Basques, MD  EPINEPHrine 0.3 mg/0.3 mL IJ SOAJ injection INJECT 1 SYRINGE INTO THE MUSCLE AS NEEDED FOR ANAPHYLAXIS 11/21/20 11/21/21  Marin Olp, MD  omeprazole (PRILOSEC) 40 MG capsule Take 1 capsule (40 mg total) by mouth daily. 06/10/20 06/17/20  Marin Olp, MD    Current Outpatient Medications  Medication Sig Dispense Refill   Acetylcarnitine HCl (ACETYL L-CARNITINE PO) Take by mouth.     Ascorbic Acid (VITAMIN C) 1000 MG tablet      atorvastatin (LIPITOR) 40 MG tablet Take 1 tablet (40 mg total) by mouth daily. 90 tablet 3   b complex vitamins tablet Take 1 tablet by mouth daily.     buPROPion (WELLBUTRIN XL) 300 MG 24 hr tablet Take 1 tablet (300 mg total) by mouth daily. 90 tablet 3   Cholecalciferol (VITAMIN D) 2000 UNITS CAPS Take 1 capsule by mouth daily.     clonazePAM (KLONOPIN) 0.5 MG tablet Take 1 tablet (0.5 mg total) by mouth 2 (two) times daily as needed (do not drive for 12 hours after taking). for anxiety 180 tablet 1   Coenzyme Q10 (CO Q 10 PO) Take 1 capsule by mouth daily.     diltiazem (CARDIZEM CD) 180 MG 24 hr capsule Take 1 capsule (180 mg total) by mouth daily. 90 capsule 3   gabapentin (NEURONTIN) 300 MG capsule Take 2 capsules (600 mg total) by mouth at bedtime. 180 capsule 3   HYDROcodone-acetaminophen (NORCO/VICODIN) 5-325 MG tablet Take 0.5-1 tablets by mouth 2 (two) times daily as needed. Do not use indicated instructions more than one to three times per week. 55 tablet 0   MAGNESIUM PO Take 1 capsule by mouth daily.     NEEDLE, DISP, 22 G 22G X 1" MISC Used to inject testosterone weekly 50 each 2   Probiotic Product (PROBIOTIC BLEND PO)      Saw Palmetto, Serenoa repens, (SAW PALMETTO PO) Take 1 capsule by mouth 2 (two) times daily.     Syringe, Disposable, 1 ML MISC USE AS DIRECTED ONCE A WEEK WITH TESTOSTERONE 25 each 1   testosterone cypionate (DEPOTESTOSTERONE CYPIONATE) 200 MG/ML injection INJECT INTRAMUSCULARLY  0.3ML WEEKLY (DISCARD  UNUSED  PORTION AFTER FIRST  USE) 3 mL 3   Zinc 15 MG CAPS Take by mouth.     COVID-19 mRNA bivalent vaccine, Pfizer, (PFIZER COVID-19 VAC BIVALENT) injection Inject into the muscle. 0.3 mL 0   COVID-19 mRNA Vac-TriS, Pfizer, (PFIZER-BIONT COVID-19 VAC-TRIS) SUSP injection Inject into the muscle. 0.3 mL 0   EPINEPHrine 0.3 mg/0.3 mL IJ SOAJ injection INJECT 1 SYRINGE INTO THE MUSCLE AS NEEDED FOR ANAPHYLAXIS 2 each 1   Current Facility-Administered Medications  Medication Dose Route Frequency Provider Last Rate Last Admin  0.9 %  sodium chloride infusion  500 mL Intravenous Once Zyon Grout, Carlota Raspberry, MD        Allergies as of 02/23/2021 - Review Complete 02/23/2021  Allergen Reaction Noted   Bee venom Anaphylaxis 11/18/2011   Flagyl [metronidazole hcl] Other (See Comments) 06/07/2011   Nsaids     Protonix [pantoprazole]  06/17/2020    Family History  Problem Relation Age of Onset   Hypertension Mother    Anxiety disorder Mother    Non-Hodgkin's lymphoma Mother    Dementia Father    Depression Father        and anxiety   Hypertension Father    Sudden death Paternal Uncle 50   Colon cancer Paternal Uncle    Hypertension Brother    Early death Neg Hx    Heart disease Neg Hx        mother in 51s had lesoin   Colon polyps Neg Hx    Esophageal cancer Neg Hx    Rectal cancer Neg Hx    Stomach cancer Neg Hx     Social History   Socioeconomic History   Marital status: Married    Spouse name: Not on file   Number of children: 0   Years of education: 18   Highest education level: Not on file  Occupational History   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 Married 30 + years. No children, lots of critters.       Cancer Survivor- carcinoid lung cancer.   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 Martin Majestic back to work for ability to be insured.       Hobbies: music, home repair/improvement, cats   Social Determinants of Health   Financial Resource Strain: Not on file  Food Insecurity: Not on file  Transportation Needs: Not on file  Physical Activity: Not on file  Stress: Not on file  Social Connections: Not on file  Intimate Partner Violence: Not on file    Review of Systems: All other review of systems negative except as mentioned in the HPI.  Physical Exam: Vital signs BP (!) 147/73   Pulse 81   Temp 98.2 F (36.8 C)   Resp 14   Ht 5\' 8"  (1.727 m)   Wt 178 lb (80.7 kg)   SpO2 97%   BMI 27.06 kg/m   General:   Alert,  Well-developed, pleasant and cooperative in NAD Lungs:  Clear throughout to auscultation.   Heart:  Regular rate and rhythm Abdomen:  Soft, nontender and nondistended.   Neuro/Psych:  Alert and cooperative. Normal mood and affect. A and O x 3  Jolly Mango, MD [facility]

## 2021-02-23 NOTE — Progress Notes (Signed)
Vss nad transferred to pacu 

## 2021-02-23 NOTE — Progress Notes (Signed)
Pt's states no medical or surgical changes since previsit or office visit.   VS taken by DT 

## 2021-02-23 NOTE — Progress Notes (Signed)
Called to room to assist during endoscopic procedure.  Patient ID and intended procedure confirmed with present staff. Received instructions for my participation in the procedure from the performing physician.  

## 2021-02-25 ENCOUNTER — Telehealth: Payer: Self-pay | Admitting: *Deleted

## 2021-02-25 NOTE — Telephone Encounter (Signed)
  Follow up Call-  Call back number 02/23/2021  Post procedure Call Back phone  # [phone]  Permission to leave phone message Yes  Some recent data might be hidden     Patient questions:  Do you have a fever, pain , or abdominal swelling? No. Pain Score  0 *  Have you tolerated food without any problems? Yes.  Have you been able to return to your normal activities? Yes.    Do you have any questions about your discharge instructions: Diet   No. Medications  No. Follow up visit  No.  Do you have questions or concerns about your Care? No.  Actions: * If pain score is 4 or above: No action needed, pain <4.  Have you developed a fever since your procedure? no  2.   Have you had an respiratory symptoms (SOB or cough) since your procedure? no  3.   Have you tested positive for COVID 19 since your procedure no  4.   Have you had any family members/close contacts diagnosed with the COVID 19 since your procedure?  no   If yes to any of these questions please route to Joylene John, RN and Joella Prince, RN

## 2021-02-27 ENCOUNTER — Other Ambulatory Visit: Payer: Self-pay | Admitting: Family Medicine

## 2021-03-02 ENCOUNTER — Encounter: Payer: Self-pay | Admitting: Family Medicine

## 2021-03-05 ENCOUNTER — Other Ambulatory Visit (HOSPITAL_BASED_OUTPATIENT_CLINIC_OR_DEPARTMENT_OTHER): Payer: Self-pay

## 2021-03-13 ENCOUNTER — Other Ambulatory Visit: Payer: Self-pay | Admitting: Family Medicine

## 2021-03-14 ENCOUNTER — Other Ambulatory Visit: Payer: Self-pay | Admitting: Family Medicine

## 2021-03-19 ENCOUNTER — Encounter: Payer: Self-pay | Admitting: Family Medicine

## 2021-03-19 ENCOUNTER — Other Ambulatory Visit: Payer: Self-pay | Admitting: Endocrinology

## 2021-03-19 MED ORDER — TESTOSTERONE CYPIONATE 100 MG/ML IM SOLN: 30.0000 mg | INTRAMUSCULAR | 2 refills | Status: DC

## 2021-03-20 ENCOUNTER — Other Ambulatory Visit: Payer: Self-pay

## 2021-03-20 DIAGNOSIS — E291 Testicular hypofunction: Secondary | ICD-10-CM

## 2021-03-20 MED ORDER — SYRINGE (DISPOSABLE) 1 ML MISC: 3 refills | Status: AC

## 2021-03-23 ENCOUNTER — Other Ambulatory Visit: Payer: Self-pay | Admitting: *Deleted

## 2021-03-23 ENCOUNTER — Other Ambulatory Visit (HOSPITAL_BASED_OUTPATIENT_CLINIC_OR_DEPARTMENT_OTHER): Payer: Self-pay

## 2021-03-23 DIAGNOSIS — E876 Hypokalemia: Secondary | ICD-10-CM

## 2021-03-23 MED ORDER — HYDROCHLOROTHIAZIDE 25 MG PO TABS
25.0000 mg | ORAL_TABLET | Freq: Every day | ORAL | 3 refills | Status: DC
  Filled 2021-03-23: qty 90, 90d supply, fill #0
  Filled 2021-06-05: qty 90, 90d supply, fill #1
  Filled 2021-08-25: qty 90, 90d supply, fill #2
  Filled 2021-11-21: qty 90, 90d supply, fill #3

## 2021-03-23 NOTE — Addendum Note (Signed)
Addended by: Zacarias Pontes on: 03/23/2021 10:49 AM   Modules accepted: Orders

## 2021-03-27 ENCOUNTER — Other Ambulatory Visit (INDEPENDENT_AMBULATORY_CARE_PROVIDER_SITE_OTHER): Payer: Medicare Other

## 2021-03-27 DIAGNOSIS — I10 Essential (primary) hypertension: Secondary | ICD-10-CM

## 2021-03-27 DIAGNOSIS — E876 Hypokalemia: Secondary | ICD-10-CM | POA: Diagnosis not present

## 2021-03-27 DIAGNOSIS — E291 Testicular hypofunction: Secondary | ICD-10-CM | POA: Diagnosis not present

## 2021-03-27 LAB — BASIC METABOLIC PANEL
BUN: 13 mg/dL (ref 6–23)
CO2: 32 mEq/L (ref 19–32)
Calcium: 9.5 mg/dL (ref 8.4–10.5)
Chloride: 101 mEq/L (ref 96–112)
Creatinine, Ser: 1.1 mg/dL (ref 0.40–1.50)
GFR: 67.85 mL/min (ref >60.00)
Glucose, Bld: 107 mg/dL — ABNORMAL HIGH (ref 70–99)
Potassium: 4.4 mEq/L (ref 3.5–5.1)
Sodium: 139 mEq/L (ref 135–145)

## 2021-03-27 LAB — CBC
HCT: 47.4 % (ref 39.0–52.0)
Hemoglobin: 16.5 g/dL (ref 13.0–17.0)
MCHC: 34.8 g/dL (ref 30.0–36.0)
MCV: 94.4 fl (ref 78.0–100.0)
Platelets: 212 10*3/uL (ref 150.0–400.0)
RBC: 5.02 Mil/uL (ref 4.22–5.81)
RDW: 12.1 % (ref 11.5–15.5)
WBC: 6.8 10*3/uL (ref 4.0–10.5)

## 2021-03-27 LAB — TESTOSTERONE: Testosterone: 760.38 ng/dL (ref 300.00–890.00)

## 2021-04-02 ENCOUNTER — Encounter: Payer: Self-pay | Admitting: Endocrinology

## 2021-04-08 ENCOUNTER — Other Ambulatory Visit: Payer: Self-pay | Admitting: Endocrinology

## 2021-04-08 DIAGNOSIS — E291 Testicular hypofunction: Secondary | ICD-10-CM

## 2021-04-08 DIAGNOSIS — R7301 Impaired fasting glucose: Secondary | ICD-10-CM

## 2021-04-08 DIAGNOSIS — D751 Secondary polycythemia: Secondary | ICD-10-CM

## 2021-04-16 ENCOUNTER — Encounter: Payer: Self-pay | Admitting: Family Medicine

## 2021-04-17 ENCOUNTER — Other Ambulatory Visit: Payer: Self-pay

## 2021-04-17 ENCOUNTER — Other Ambulatory Visit (INDEPENDENT_AMBULATORY_CARE_PROVIDER_SITE_OTHER): Payer: Medicare Other

## 2021-04-17 DIAGNOSIS — I10 Essential (primary) hypertension: Secondary | ICD-10-CM | POA: Diagnosis not present

## 2021-04-17 LAB — BASIC METABOLIC PANEL
BUN: 12 mg/dL (ref 6–23)
CO2: 29 mEq/L (ref 19–32)
Calcium: 9.6 mg/dL (ref 8.4–10.5)
Chloride: 102 mEq/L (ref 96–112)
Creatinine, Ser: 1.06 mg/dL (ref 0.40–1.50)
GFR: 70.9 mL/min (ref >60.00)
Glucose, Bld: 91 mg/dL (ref 70–99)
Potassium: 3.8 mEq/L (ref 3.5–5.1)
Sodium: 139 mEq/L (ref 135–145)

## 2021-04-20 NOTE — Patient Instructions (Addendum)
Health Maintenance Due  Topic Date Due   TETANUS/TDAP - consider this next visit or at pharmacy- separate 2-3 weeks from shingrix 04/09/2021   Please stop by lab before you go If you have mychart- we will send your results within 3 business days of [place] receiving them.  If you do not have mychart- we will call you about results within 5 business days of [place] receiving them.  *please also note that you will see labs on mychart as soon as they post. I will later go in and write notes on them- will say "notes from Dr. Yong Channel"   Please go to [facility] (updated 05/31/2019) - located [address] across the street from [place] - in the basement - Hours: 8:30-5:00 PM M-F (with lunch from 12:30- 1 PM). You do NOT need an appointment.  - Please ensure you are covid symptom free before going in(No fever, chills, cough, congestion, runny nose, shortness of breath, fatigue, body aches, sore throat, headache, nausea, vomiting, diarrhea, or new loss of taste or smell. No known contacts with covid 19 or someone being tested for covid 19)  Recommended follow up: Return in about 4 months (around 08/25/2021) for follow up- or sooner if needed.

## 2021-04-20 NOTE — Progress Notes (Signed)
Phone [phone] In person visit   Subjective:   [name] Page is a 71 y.o. year old very pleasant male patient who presents for/with See problem oriented charting Chief Complaint  Patient presents with   Hypertension   This visit occurred during the SARS-CoV-2 public health emergency.  Safety protocols were in place, including screening questions prior to the visit, additional usage of staff PPE, and extensive cleaning of exam room while observing appropriate contact time as indicated for disinfecting solutions.   Past Medical History-  Patient Active Problem List   Diagnosis Date Noted   Chronic pain syndrome 08/31/2016    Priority: High   History of lung cancer 12/11/2012    Priority: High   Aortic atherosclerosis ([place]) 11/03/2020    Priority: Medium    History of adenomatous polyp of colon 03/24/2018    Priority: Medium    Generalized anxiety disorder 03/12/2014    Priority: Medium    Essential hypertension 03/12/2014    Priority: Medium    Peripheral neuropathy, idiopathic 12/22/2012    Priority: Medium    Dyspnea 07/14/2012    Priority: Medium    Hypertriglyceridemia 07/14/2012    Priority: Medium    Depression 02/16/2010    Priority: Medium    Low testosterone 11/21/2007    Priority: Medium    Chronic bilateral low back pain 06/05/2019    Priority: Low   Venous stasis of both lower extremities 06/05/2019    Priority: Low   Mastoiditis 03/09/2017    Priority: Low   Palpitations 08/11/2016    Priority: Low   Eustachian tube dysfunction 12/17/2014    Priority: Low   SI (sacroiliac) joint dysfunction 09/19/2013    Priority: Low   Nonspecific abnormal electrocardiogram (ECG) (EKG) 07/14/2012    Priority: Low   Allergic reaction, history of 10/25/2011    Priority: Low   Rectal or anal pain 10/01/2010    Priority: Low   Allergic rhinitis 06/13/2007    Priority: Low   GERD 06/13/2007    Priority: Low   IBS (irritable bowel syndrome) 06/13/2007     Priority: Low   Nonallopathic lesion of sacral region 09/19/2013   Nonallopathic lesion of thoracic region 09/19/2013   Nonallopathic lesion of lumbosacral region 09/19/2013    Medications- reviewed and updated Current Outpatient Medications  Medication Sig Dispense Refill   Acetylcarnitine HCl (ACETYL L-CARNITINE PO) Take by mouth.     Ascorbic Acid (VITAMIN C) 1000 MG tablet      atorvastatin (LIPITOR) 40 MG tablet TAKE 1 TABLET BY MOUTH  DAILY 90 tablet 3   b complex vitamins tablet Take 1 tablet by mouth daily.     buPROPion (WELLBUTRIN XL) 300 MG 24 hr tablet TAKE 1 TABLET BY MOUTH  DAILY 90 tablet 3   Cholecalciferol (VITAMIN D) 2000 UNITS CAPS Take 1 capsule by mouth daily.     Coenzyme Q10 (CO Q 10 PO) Take 1 capsule by mouth daily.     colestipol (COLESTID) 1 g tablet Take 1 tablet (1 g total) by mouth 2 (two) times daily. 60 tablet 1   diltiazem (CARDIZEM CD) 180 MG 24 hr capsule TAKE 1 CAPSULE BY MOUTH  DAILY 90 capsule 3   EPINEPHrine 0.3 mg/0.3 mL IJ SOAJ injection INJECT 1 SYRINGE INTO THE MUSCLE AS NEEDED FOR ANAPHYLAXIS 2 each 1   gabapentin (NEURONTIN) 300 MG capsule TAKE 2 CAPSULES BY MOUTH AT BEDTIME 180 capsule 3   hydrochlorothiazide (HYDRODIURIL) 25 MG tablet Take 1 tablet (  25 mg total) by mouth daily. 90 tablet 3   HYDROcodone-acetaminophen (NORCO/VICODIN) 5-325 MG tablet Take 0.5-1 tablets by mouth 2 (two) times daily as needed. Do not use indicated instructions more than one to three times per week. 55 tablet 0   Influenza Vac High-Dose Quad 0.7 ML SUSY      methylcellulose (CITRUCEL) oral powder Use as directed once daily     NEEDLE, DISP, 22 G 22G X 1" MISC Used to inject testosterone weekly 50 each 2   potassium chloride (KLOR-CON M) 10 MEQ tablet Take 20 mEq by mouth 2 (two) times daily.     Probiotic Product (PROBIOTIC BLEND PO)      Saw Palmetto, Serenoa repens, (SAW PALMETTO PO) Take 1 capsule by mouth 2 (two) times daily.     Syringe, Disposable, 1 ML MISC  USE AS DIRECTED ONCE A WEEK WITH TESTOSTERONE 25 each 3   testosterone cypionate (DEPO-TESTOSTERONE) 100 MG/ML injection Inject 0.3 mLs (30 mg total) into the muscle every 7 (seven) days. For IM use only 10 mL 2   Zinc 15 MG CAPS Take by mouth.     Zoster Vaccine Adjuvanted [facility]) injection Inject into the muscle. 0.5 mL 1   clonazePAM (KLONOPIN) 0.5 MG tablet Take 1 tablet (0.5 mg total) by mouth 2 (two) times daily as needed (do not drive for 12 hours after taking). for anxiety 180 tablet 1   No current facility-administered medications for this visit.     Objective:  BP 130/62 (BP Location: Left Arm, Patient Position: Sitting, Cuff Size: Large)    Pulse 63    Temp 98.6 F (37 C) (Oral)    Ht 5\' 8"  (1.727 m)    Wt 182 lb (82.6 kg)    SpO2 98%    BMI 27.67 kg/m  Gen: NAD, resting comfortably CV: RRR no murmurs rubs or gallops Lungs: CTAB no crackles, wheeze, rhonchi Ext: no edema Skin: warm, dr    Assessment and Plan   #social update- he is enjoying retirement- still doing a few shifts but much more sparingly - works from home  #History of lung cancer/actually carcinoid tumor malignant-we follow an annual chest x-ray on him. Last time March 2021-later with CT chest abdomen pelvis 03/05/2020-we discussed updating at this time- he is open and ordered   #Chronic pain syndrome/neuropathy S: Medication: Gabapentin 600 mg, sparing hydrocodone twice a week sometimes 3. He knew to space hydrocodone and clonazepam at least 12 hours.   - overall stable- thankfully tolerating walking lately A/P: Chronic pain overall stable-refill hydrocodone as needed as long as keeps regular follow up  -PDMP reviewed and low risk-only prescriptions are from me as well as Dr. Dwyane Dee for testosterone -UDS last updated 06/17/20-still within 1 year -Controlled substance contract on file from 07/08/2017  #hypertension/hypokalemia requiring potassium 20 mEq twice daily S: medication: Diltiazem 180 mg extended  release (amlodipine caused edema in past), hydrochlorothiazide 25 mg. Some elevations in the past and had considered increasing diltiazem to 240 mg  -Still taking potassium 20 meq twice daily BP Readings from Last 3 Encounters:  04/27/21 130/62  04/21/21 126/64  02/23/21 119/68  A/P: Controlled. Continue current medications.   #Hypertriglyceridemia/aortic atherosclerosis on the other hand with coronary calcium score of 0 on 07/08/2020 S: Medication:Atorvastatin 20 mg daily- takes half of 40 mg -Had microvascular ischemia on prior MRI  Has been doing some walking pretty much daily on treadmill for last few months and doing some swimming on tues/Thursday but was  having issues with chlorine- zyrtec and benadryl were not adequately helpful.   Tried colestipol for diarrhea through Dr. Havery Moros but having some stomach burning and diarrhea actually seemed worse - only took a single dose and pulling back off for now- he will follow up with Dr. Havery Moros Lab Results  Component Value Date   CHOL 116 11/12/2020   HDL 46.50 11/12/2020   LDLCALC 57 11/12/2020   TRIG 60.0 11/12/2020   CHOLHDL 2 11/12/2020   A/P: Well-controlled on last check-continue current medication -for aortic atherosclerosis presumed stable-at goal with LDL under 70  # Depression/GAD S: Medication:Wellbutrin XL 300 mg daily . Uses clonazepam 1-2 times a day-we discussed trying to cut back. Had not tolerated SSRIs due to palpitations A/P: reasonable control with phq9 of 5- had a tough day last week but related to shingrix and poor nights sleep.  - he is interested in ActX testing- asks me to submit this todya  #Hypogonadism-on testosterone treatment through Dr. Dwyane Dee  #1st shingrix- had some muscle aches for several days.   Recommended follow up: Return in about 4 months (around 08/25/2021) for follow up- or sooner if needed. Future Appointments  Date Time Provider [place]  08/25/2021 10:00 AM Elayne Snare, MD  [facility] None   Lab/Order assocations:   ICD-10-CM   1. Essential hypertension  I10     2. Hypertriglyceridemia  E78.1     3. Generalized anxiety disorder  F41.1     4. Recurrent major depressive disorder, in partial remission ([place])  F33.41     5. Aortic atherosclerosis (HCC)  I70.0     6. High risk medication use  Z79.899 ACTX PHARMACOGENOMICS SERVICE-BLOOD    7. History of lung cancer  Z85.118 DG Chest 2 View      Meds ordered this encounter  Medications   clonazePAM (KLONOPIN) 0.5 MG tablet    Sig: Take 1 tablet (0.5 mg total) by mouth 2 (two) times daily as needed (do not drive for 12 hours after taking). for anxiety    Dispense:  180 tablet    Refill:  1   I,Jada Bradford,acting as a scribe for [name], MD.,have documented all relevant documentation on the behalf of [name], MD,as directed by  [name], MD while in the presence of [name], MD.   I, [name], MD, have reviewed all documentation for this visit. The documentation on 04/27/21 for the exam, diagnosis, procedures, and orders are all accurate and complete.   Return precautions advised.  [name], MD

## 2021-04-21 ENCOUNTER — Other Ambulatory Visit (HOSPITAL_BASED_OUTPATIENT_CLINIC_OR_DEPARTMENT_OTHER): Payer: Self-pay

## 2021-04-21 ENCOUNTER — Other Ambulatory Visit: Payer: Medicare Other

## 2021-04-21 ENCOUNTER — Encounter: Payer: Self-pay | Admitting: Gastroenterology

## 2021-04-21 ENCOUNTER — Ambulatory Visit: Payer: Medicare Other | Admitting: Gastroenterology

## 2021-04-21 VITALS — BP 126/64 | HR 78 | Ht 68.0 in | Wt 182.0 lb

## 2021-04-21 DIAGNOSIS — K529 Noninfective gastroenteritis and colitis, unspecified: Secondary | ICD-10-CM

## 2021-04-21 MED ORDER — CITRUCEL PO POWD: ORAL | Status: AC

## 2021-04-21 MED ORDER — ZOSTER VAC RECOMB ADJUVANTED 50 MCG/0.5ML IM SUSR
INTRAMUSCULAR | 1 refills | Status: DC
  Filled 2021-04-21: qty 0.5, 1d supply, fill #0
  Filled 2021-08-26: qty 0.5, 1d supply, fill #1

## 2021-04-21 MED ORDER — COLESTIPOL HCL 1 G PO TABS
1.0000 g | ORAL_TABLET | Freq: Two times a day (BID) | ORAL | 1 refills | Status: DC
  Filled 2021-04-21: qty 60, 30d supply, fill #0

## 2021-04-21 NOTE — Progress Notes (Signed)
HPI :  71 year old male here for a follow-up visit to discuss chronic loose stools.  I know him from prior screening/surveillance colonoscopies.  He endorses chronic diarrhea dating back to the 24s.  He states remotely he was using Metamucil in the past for this which helped somewhat but did not resolve it.  At baseline he has had mostly soft stools to loose stools, goes about 2-3 times per day on average that can have some urgency.  He historically has had rare accidents associated with the urgency, not common.  In recent months he has been taking Metamucil more routinely, trying to exercise more, and started some probiotics and states its been slightly better and not as bad but again not resolved.  It appears remotely he had a history of lymphocytic colitis in the past but his last 2 colonoscopies in 2019 and 2022 as below showed no evidence of this on random biopsies.  He does not much have much cramps or abdominal pains.  No blood in his stools.  He has taken Imodium in the past which has led to constipation unfortunately.  He has some gas but not much bloating.  He does take a magnesium supplement on most days.  He has been tried on TCAs remotely in the past which he states does not seem to have helped or was not tolerated.  No weight loss.  His symptoms are stable and chronic but inquires about other options.  No other new medication changes that he endorses   Colonoscopy 03/15/2018 - The digital rectal exam findings include perianal rash. - A diminutive polyp was found in the cecum. The polyp was sessile. The polyp was removed with a cold biopsy forceps. Resection and retrieval were complete. - A diminutive polyp was found in the ascending colon. The polyp was sessile. The polyp was removed with a cold biopsy forceps. Resection and retrieval were complete. - A 5 to 6 mm polyp was found in the recto-sigmoid colon. The polyp was semipedunculated. The polyp was removed with a hot snare. Resection  and retrieval were complete. - Multiple small-mouthed diverticula were found in the sigmoid colon. - The exam was otherwise without abnormality other than small hemorrhoids. - Biopsies for histology were taken with a cold forceps from the right colon, left colon and transverse colon for evaluation of microscopic colitis.   1. Surgical [P], cecum, ascending, rectosigmoid, polyp (3) - TUBULAR ADENOMA (SIX). - NO HIGH GRADE DYSPLASIA OR MALIGNANCY. 2. Surgical [P], random sites - COLONIC MUCOSA WITH NO SIGNIFICANT PATHOLOGIC CHANGES. - NO MICROSCOPIC COLITIS, ACTIVE INFLAMMATION OR GRANULOMAS.   Colonoscopy 02/23/21 - The perianal and digital rectal examinations were normal. - The terminal ileum appeared normal. - A diminutive polyp was found in the descending colon. The polyp was sessile. The polyp was removed with a cold biopsy forceps. Resection and retrieval were complete. - A few small-mouthed diverticula were found in the sigmoid colon. - There was a lipoma, in the sigmoid colon. - Internal hemorrhoids were found during retroflexion. The hemorrhoids were small. - The exam was otherwise without abnormality. - Biopsies for histology were taken with a cold forceps from the right colon, left colon and transverse colon for evaluation of microscopic colitis given the patient's symptoms and history of microscopic colitis years ago.   1. Surgical [P], colon nos, random sites - UNREMARKABLE COLONIC MUCOSA. - NO MICROSCOPIC COLITIS, ACTIVE INFLAMMATION OR CHRONIC CHANGES. 2. Surgical [P], colon, descending, polyp (1) - COLONIC MUCOSA WITH BENIGN LYMPHOID AGGREGATE. -  MULTIPLE ADDITIONAL LEVELS EXAMINED.    CT scan 03/05/20 - IMPRESSION: 1. No evidence of recurrent bronchial carcinoid. 2. Postsurgical change in the RIGHT lower lobe without evidence of local recurrence. 3. No acute findings in the abdomen pelvis. 4. No explanation for LEFT lower quadrant pain. 5. Degenerative  changes of the lumbar spine.    Past Medical History:  Diagnosis Date   Allergic rhinitis, cause unspecified    MILD   Anxiety    Arthritis    OA   Cancer (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   [name]   parotid tumor surgery     [place]- BENIGN   POLYPECTOMY     TONSILLECTOMY AND ADENOIDECTOMY     Family History  Problem Relation Age of Onset   Hypertension Mother    Anxiety disorder Mother    Non-Hodgkin's lymphoma Mother    Dementia Father    Depression Father        and anxiety   Hypertension Father    Sudden death Paternal Uncle 13   Colon cancer Paternal Uncle    Hypertension Brother    Early death Neg Hx    Heart disease Neg Hx        mother in 66s had lesoin   Colon polyps Neg Hx    Esophageal cancer Neg Hx    Rectal cancer Neg Hx    Stomach cancer Neg Hx    Social History   Tobacco Use   Smoking status: Never   Smokeless tobacco: Never   Tobacco comments:    exposed to second hand smoke alot when he was younger  Vaping Use   Vaping Use: Never used  Substance Use Topics   Alcohol use: Yes    Comment: rare occ beer with dinner ONE A MONTH   Drug use: No   Current Outpatient Medications  Medication Sig Dispense Refill   Acetylcarnitine HCl (ACETYL L-CARNITINE PO) Take by mouth.     Ascorbic Acid (VITAMIN C) 1000 MG tablet      atorvastatin (LIPITOR) 40  MG tablet TAKE 1 TABLET BY MOUTH  DAILY 90 tablet 3   b complex vitamins tablet Take 1 tablet by mouth daily.     buPROPion (WELLBUTRIN XL) 300 MG 24 hr tablet TAKE 1 TABLET BY MOUTH  DAILY 90 tablet 3   Cholecalciferol (VITAMIN D) 2000 UNITS CAPS Take 1 capsule by mouth daily.     clonazePAM (KLONOPIN) 0.5 MG tablet Take 1 tablet (0.5 mg total) by mouth 2 (two) times daily as needed (do not drive for 12 hours after taking). for anxiety 180 tablet 1   Coenzyme Q10 (CO Q 10 PO) Take 1 capsule by mouth daily.  COVID-19 mRNA bivalent vaccine, Pfizer, (PFIZER COVID-19 VAC BIVALENT) injection Inject into the muscle. 0.3 mL 0   COVID-19 mRNA Vac-TriS, Pfizer, (PFIZER-BIONT COVID-19 VAC-TRIS) SUSP injection Inject into the muscle. 0.3 mL 0   diltiazem (CARDIZEM CD) 180 MG 24 hr capsule TAKE 1 CAPSULE BY MOUTH  DAILY 90 capsule 3   EPINEPHrine 0.3 mg/0.3 mL IJ SOAJ injection INJECT 1 SYRINGE INTO THE MUSCLE AS NEEDED FOR ANAPHYLAXIS 2 each 1   gabapentin (NEURONTIN) 300 MG capsule TAKE 2 CAPSULES BY MOUTH AT BEDTIME 180 capsule 3   hydrochlorothiazide (HYDRODIURIL) 25 MG tablet Take 1 tablet (25 mg total) by mouth daily. 90 tablet 3   HYDROcodone-acetaminophen (NORCO/VICODIN) 5-325 MG tablet Take 0.5-1 tablets by mouth 2 (two) times daily as needed. Do not use indicated instructions more than one to three times per week. 55 tablet 0   MAGNESIUM PO Take 1 capsule by mouth daily.     NEEDLE, DISP, 22 G 22G X 1" MISC Used to inject testosterone weekly 50 each 2   potassium chloride (KLOR-CON M) 10 MEQ tablet Take 20 mEq by mouth 2 (two) times daily.     Probiotic Product (PROBIOTIC BLEND PO)      Saw Palmetto, Serenoa repens, (SAW PALMETTO PO) Take 1 capsule by mouth 2 (two) times daily.     Syringe, Disposable, 1 ML MISC USE AS DIRECTED ONCE A WEEK WITH TESTOSTERONE 25 each 3   testosterone cypionate (DEPO-TESTOSTERONE) 100 MG/ML injection Inject 0.3 mLs (30 mg total) into the muscle every 7 (seven)  days. For IM use only 10 mL 2   Zinc 15 MG CAPS Take by mouth.     No current facility-administered medications for this visit.   Allergies  Allergen Reactions   Bee Venom Anaphylaxis   Flagyl [Metronidazole Hcl] Other (See Comments)    Neuropathy    Nsaids     REACTION: intolereance- ABD PAIN AND DIARRHEA    Protonix [Pantoprazole]     Abdominal Pain and Diarrhea     Review of Systems: All systems reviewed and negative except where noted in HPI.   Lab Results  Component Value Date   WBC 6.8 03/27/2021   HGB 16.5 03/27/2021   HCT 47.4 03/27/2021   MCV 94.4 03/27/2021   PLT 212.0 03/27/2021     Lab Results  Component Value Date   CREATININE 1.06 04/17/2021   BUN 12 04/17/2021   NA 139 04/17/2021   K 3.8 04/17/2021   CL 102 04/17/2021   CO2 29 04/17/2021    Lab Results  Component Value Date   ALT 34 01/22/2021   AST 29 01/22/2021   ALKPHOS 71 01/22/2021   BILITOT 1.0 01/22/2021     Physical Exam: BP 126/64    Pulse 78    Ht 5\' 8"  (1.727 m)    Wt 182 lb (82.6 kg)    BMI 27.67 kg/m  Constitutional: Pleasant,well-developed, male in no acute distress. Neurological: Alert and oriented to person place and time. Psychiatric: Normal mood and affect. Behavior is normal.   ASSESSMENT AND PLAN: 71 year old male here for reassessment of the following:  Chronic diarrhea  As above longstanding symptoms that are somewhat improved with Metamucil but certainly not resolved and has been chronic.  Remote history of lymphocytic colitis but last colonoscopy showed no evidence of this.  I will screen him for celiac disease with TTG IgA and total IgA level.  He takes a magnesium supplement on most days, cautioned him this can  cause diarrhea and recommend he stop it.  We discussed chronic diarrhea in general and ways to manage this.  We discussed dietary modifications, specifically reviewed a low FODMAP diet and provided some handouts for him to try to see if that helps.  We  reviewed his probiotic history, unclear if this is helped him versus the Metamucil but would stop it at this point in time as we are going to try other options.  He will switch his Metamucil to Citrucel daily to see if this will still bulk his stool but give him less gas.  We will start empiric Colestid 1 g twice daily, discussed this option with him and potential risks and he wants to proceed.  He should discuss it with his pharmacy to ensure does not find any of the other medicines he is taking.  I would like to hear from him in 1 month for reassessment and we can tweak his regimen moving forward based on his course and response.  He agreed  Jolly Mango, MD [facility]

## 2021-04-21 NOTE — Patient Instructions (Addendum)
If you are age 71 or older, your body mass index should be between 23-30. Your Body mass index is 27.67 kg/m. If this is out of the aforementioned range listed, please consider follow up with your Primary Care Provider.  If you are age 31 or younger, your body mass index should be between 19-25. Your Body mass index is 27.67 kg/m. If this is out of the aformentioned range listed, please consider follow up with your Primary Care Provider.   ________________________________________________________  The [facility] providers would like to encourage you to use [facility] to communicate with providers for non-urgent requests or questions.  Due to long hold times on the telephone, sending your provider a message by [facility] may be a faster and more efficient way to get a response.  Please allow 48 business hours for a response.  Please remember that this is for non-urgent requests.  _______________________________________________________  Please go to the lab in the basement of our building to have lab work done as you leave today. Hit "B" for basement when you get on the elevator.  When the doors open the lab is on your left.  We will call you with the results. Thank you.  Stop Magnesium.   Hold your probiotic.  We have sent the following medications to your pharmacy for you to pick up at your convenience: Colestid 1 g: Take twice daily  Stop Metamucil and start Citrucel - over-the-counter, once daily as directed.  We are giving you a Low-FODMAP diet handout today. FODMAPs are short-chain carbohydrates (sugars) that are highly fermentable, which means that they go through chemical changes in the GI system, and are poorly absorbed during digestion. When FODMAPs reach the colon (large intestine), bacteria ferment these sugars, turning them into gas and chemicals. This stretches the walls of the colon, causing abdominal bloating, distension, cramping, pain, and/or changes in bowel habits in many patients  with IBS. FODMAPs are not unhealthy or harmful, but may exacerbate GI symptoms in those with sensitive GI tracts.  Please use MyChart to provide an update on how you are doing in one month.  Thank you for entrusting me with your care and for choosing [facility], Dr. Moore Cellar

## 2021-04-22 ENCOUNTER — Other Ambulatory Visit (HOSPITAL_BASED_OUTPATIENT_CLINIC_OR_DEPARTMENT_OTHER): Payer: Self-pay

## 2021-04-22 LAB — IGA: Immunoglobulin A: 86 mg/dL (ref 70–320)

## 2021-04-22 LAB — TISSUE TRANSGLUTAMINASE, IGA: (tTG) Ab, IgA: <1 U/mL

## 2021-04-24 ENCOUNTER — Telehealth: Payer: Medicare Other | Admitting: Family Medicine

## 2021-04-27 ENCOUNTER — Encounter: Payer: Self-pay | Admitting: Family Medicine

## 2021-04-27 ENCOUNTER — Other Ambulatory Visit: Payer: Self-pay

## 2021-04-27 ENCOUNTER — Other Ambulatory Visit (HOSPITAL_BASED_OUTPATIENT_CLINIC_OR_DEPARTMENT_OTHER): Payer: Self-pay

## 2021-04-27 ENCOUNTER — Ambulatory Visit (INDEPENDENT_AMBULATORY_CARE_PROVIDER_SITE_OTHER): Payer: Medicare Other | Admitting: Family Medicine

## 2021-04-27 VITALS — BP 130/62 | HR 63 | Temp 98.6°F | Ht 68.0 in | Wt 182.0 lb

## 2021-04-27 DIAGNOSIS — I700 Atherosclerosis of aorta: Secondary | ICD-10-CM

## 2021-04-27 DIAGNOSIS — E781 Pure hyperglyceridemia: Secondary | ICD-10-CM

## 2021-04-27 DIAGNOSIS — F3341 Major depressive disorder, recurrent, in partial remission: Secondary | ICD-10-CM

## 2021-04-27 DIAGNOSIS — F411 Generalized anxiety disorder: Secondary | ICD-10-CM

## 2021-04-27 DIAGNOSIS — I10 Essential (primary) hypertension: Secondary | ICD-10-CM

## 2021-04-27 DIAGNOSIS — Z79899 Other long term (current) drug therapy: Secondary | ICD-10-CM | POA: Diagnosis not present

## 2021-04-27 DIAGNOSIS — Z85118 Personal history of other malignant neoplasm of bronchus and lung: Secondary | ICD-10-CM

## 2021-04-27 MED ORDER — CLONAZEPAM 0.5 MG PO TABS
0.5000 mg | ORAL_TABLET | Freq: Two times a day (BID) | ORAL | 1 refills | Status: DC | PRN
  Filled 2021-04-27: qty 180, 90d supply, fill #0
  Filled 2021-08-25: qty 180, 90d supply, fill #1

## 2021-04-27 NOTE — Addendum Note (Signed)
Addended by: Marin Olp on: 04/27/2021 10:33 AM   Modules accepted: Orders

## 2021-05-01 ENCOUNTER — Other Ambulatory Visit: Payer: Medicare Other

## 2021-05-01 ENCOUNTER — Ambulatory Visit (INDEPENDENT_AMBULATORY_CARE_PROVIDER_SITE_OTHER)
Admission: RE | Admit: 2021-05-01 | Discharge: 2021-05-01 | Disposition: A | Discharge status: Home / Self Care | Payer: Medicare Other | Source: Ambulatory Visit | Attending: Family Medicine | Admitting: Family Medicine

## 2021-05-01 ENCOUNTER — Other Ambulatory Visit: Payer: Self-pay

## 2021-05-01 DIAGNOSIS — Z85118 Personal history of other malignant neoplasm of bronchus and lung: Secondary | ICD-10-CM | POA: Diagnosis not present

## 2021-05-01 DIAGNOSIS — Z79899 Other long term (current) drug therapy: Secondary | ICD-10-CM

## 2021-05-01 LAB — ACTX PHARMACOGENOMICS SERVICE-BLOOD

## 2021-05-07 ENCOUNTER — Encounter: Payer: Self-pay | Admitting: Family Medicine

## 2021-05-12 ENCOUNTER — Telehealth: Payer: Self-pay | Admitting: *Deleted

## 2021-05-12 NOTE — Telephone Encounter (Signed)
Contacted patient due to a lab specimen that came here to our office at [facility]  He states the lab for ActX was drawn at [facility] on [address].  Dr Yong Channel ordered the labs but the patient states provider is going to cancel, so I will discard the sample.

## 2021-05-13 NOTE — Telephone Encounter (Signed)
Hey team- so we ordered this (only available order in epic) but price was going to be 400 instead of 200 and patient wanted to cancel- I want to cancel as well- can we reach out ot actX and cancel this please? Im not sure how to do so

## 2021-05-21 ENCOUNTER — Other Ambulatory Visit: Payer: Self-pay

## 2021-05-21 ENCOUNTER — Encounter: Payer: Self-pay | Admitting: Family Medicine

## 2021-05-21 NOTE — Addendum Note (Signed)
Addended by: Jodell Cipro on: 05/21/2021 09:53 AM   Modules accepted: Orders

## 2021-05-22 NOTE — Progress Notes (Signed)
I do not see a prescription loaded or med listed or rx refill request

## 2021-05-25 ENCOUNTER — Other Ambulatory Visit: Payer: Self-pay

## 2021-05-25 MED ORDER — HYDROCODONE-ACETAMINOPHEN 5-325 MG PO TABS
0.5000 | ORAL_TABLET | Freq: Two times a day (BID) | ORAL | 0 refills | Status: DC | PRN
Start: 2021-05-25 — End: 2021-08-26
  Filled 2021-05-25: qty 55, 28d supply, fill #0

## 2021-05-25 NOTE — Progress Notes (Signed)
I refilled this for him.  

## 2021-05-26 ENCOUNTER — Other Ambulatory Visit (HOSPITAL_BASED_OUTPATIENT_CLINIC_OR_DEPARTMENT_OTHER): Payer: Self-pay

## 2021-05-27 ENCOUNTER — Other Ambulatory Visit (HOSPITAL_BASED_OUTPATIENT_CLINIC_OR_DEPARTMENT_OTHER): Payer: Self-pay

## 2021-05-28 ENCOUNTER — Other Ambulatory Visit (HOSPITAL_BASED_OUTPATIENT_CLINIC_OR_DEPARTMENT_OTHER): Payer: Self-pay

## 2021-05-28 MED ORDER — TETANUS-DIPHTH-ACELL PERTUSSIS 5-2.5-18.5 LF-MCG/0.5 IM SUSY
PREFILLED_SYRINGE | INTRAMUSCULAR | 0 refills | Status: DC
  Filled 2021-05-28: qty 0.5, 1d supply, fill #0

## 2021-06-03 ENCOUNTER — Encounter: Payer: Self-pay | Admitting: Gastroenterology

## 2021-06-05 ENCOUNTER — Other Ambulatory Visit (HOSPITAL_BASED_OUTPATIENT_CLINIC_OR_DEPARTMENT_OTHER): Payer: Self-pay

## 2021-06-08 ENCOUNTER — Other Ambulatory Visit (HOSPITAL_BASED_OUTPATIENT_CLINIC_OR_DEPARTMENT_OTHER): Payer: Self-pay

## 2021-06-19 ENCOUNTER — Ambulatory Visit (INDEPENDENT_AMBULATORY_CARE_PROVIDER_SITE_OTHER): Payer: Medicare Other

## 2021-06-19 ENCOUNTER — Other Ambulatory Visit: Payer: Self-pay

## 2021-06-19 DIAGNOSIS — Z0000 Encounter for general adult medical examination without abnormal findings: Secondary | ICD-10-CM

## 2021-06-19 NOTE — Patient Instructions (Signed)
[name] Page , ?Thank you for taking time to come for your Medicare Wellness Visit. I appreciate your ongoing commitment to your health goals. Please review the following plan we discussed and let me know if I can assist you in the future.  ? ?Screening recommendations/referrals: ?Colonoscopy: Done 02/23/21 repeat every 3 year  ?Recommended yearly ophthalmology/optometry visit for glaucoma screening and checkup ?Recommended yearly dental visit for hygiene and checkup ? ?Vaccinations: ?Influenza vaccine: Done 01/25/21 repeat every year  ?Pneumococcal vaccine: Up to date ?Tdap vaccine: Done 05/28/21 repeat every 10 years  ?Shingles vaccine: 1st dose 04/21/21    ?Covid-19: Completed 1/14, 2/8, 12/31/19 & 5/16, 12/25/20 ? ?Advanced directives: Advance directive discussed with you today. I have provided a copy for you to complete at home and have notarized. Once this is complete please bring a copy in to our office so we can scan it into your chart. ? ? ?Conditions/risks identified: weight loss  ? ?Next appointment: Follow up in one year for your annual wellness visit.  ? ?Preventive Care 5 Years and Older, Male ?Preventive care refers to lifestyle choices and visits with your health care provider that can promote health and wellness. ?What does preventive care include? ?A yearly physical exam. This is also called an annual well check. ?Dental exams once or twice a year. ?Routine eye exams. Ask your health care provider how often you should have your eyes checked. ?Personal lifestyle choices, including: ?Daily care of your teeth and gums. ?Regular physical activity. ?Eating a healthy diet. ?Avoiding tobacco and drug use. ?Limiting alcohol use. ?Practicing safe sex. ?Taking low doses of aspirin every day. ?Taking vitamin and mineral supplements as recommended by your health care provider. ?What happens during an annual well check? ?The services and screenings done by your health care provider during your annual well check  will depend on your age, overall health, lifestyle risk factors, and family history of disease. ?Counseling  ?Your health care provider may ask you questions about your: ?Alcohol use. ?Tobacco use. ?Drug use. ?Emotional well-being. ?Home and relationship well-being. ?Sexual activity. ?Eating habits. ?History of falls. ?Memory and ability to understand (cognition). ?Work and work Statistician. ?Screening  ?You may have the following tests or measurements: ?Height, weight, and BMI. ?Blood pressure. ?Lipid and cholesterol levels. These may be checked every 5 years, or more frequently if you are over 43 years old. ?Skin check. ?Lung cancer screening. You may have this screening every year starting at age 12 if you have a 30-pack-year history of smoking and currently smoke or have quit within the past 15 years. ?Fecal occult blood test (FOBT) of the stool. You may have this test every year starting at age 50. ?Flexible sigmoidoscopy or colonoscopy. You may have a sigmoidoscopy every 5 years or a colonoscopy every 10 years starting at age 21. ?Prostate cancer screening. Recommendations will vary depending on your family history and other risks. ?Hepatitis C blood test. ?Hepatitis B blood test. ?Sexually transmitted disease (STD) testing. ?Diabetes screening. This is done by checking your blood sugar (glucose) after you have not eaten for a while (fasting). You may have this done every 1-3 years. ?Abdominal aortic aneurysm (AAA) screening. You may need this if you are a current or former smoker. ?Osteoporosis. You may be screened starting at age 58 if you are at high risk. ?Talk with your health care provider about your test results, treatment options, and if necessary, the need for more tests. ?Vaccines  ?Your health care provider may recommend certain vaccines,  such as: ?Influenza vaccine. This is recommended every year. ?Tetanus, diphtheria, and acellular pertussis (Tdap, Td) vaccine. You may need a Td booster every 10  years. ?Zoster vaccine. You may need this after age 3. ?Pneumococcal 13-valent conjugate (PCV13) vaccine. One dose is recommended after age 25. ?Pneumococcal polysaccharide (PPSV23) vaccine. One dose is recommended after age 53. ?Talk to your health care provider about which screenings and vaccines you need and how often you need them. ?This information is not intended to replace advice given to you by your health care provider. Make sure you discuss any questions you have with your health care provider. ?Document Released: 04/18/2015 Document Revised: 12/10/2015 Document Reviewed: 01/21/2015 ?Elsevier Interactive Patient Education ? 2017 [place]. ? ?Fall Prevention in the Home ?Falls can cause injuries. They can happen to people of all ages. There are many things you can do to make your home safe and to help prevent falls. ?What can I do on the outside of my home? ?Regularly fix the edges of walkways and driveways and fix any cracks. ?Remove anything that might make you trip as you walk through a door, such as a raised step or threshold. ?Trim any bushes or trees on the path to your home. ?Use bright outdoor lighting. ?Clear any walking paths of anything that might make someone trip, such as rocks or tools. ?Regularly check to see if handrails are loose or broken. Make sure that both sides of any steps have handrails. ?Any raised decks and porches should have guardrails on the edges. ?Have any leaves, snow, or ice cleared regularly. ?Use sand or salt on walking paths during winter. ?Clean up any spills in your garage right away. This includes oil or grease spills. ?What can I do in the bathroom? ?Use night lights. ?Install grab bars by the toilet and in the tub and shower. Do not use towel bars as grab bars. ?Use non-skid mats or decals in the tub or shower. ?If you need to sit down in the shower, use a plastic, non-slip stool. ?Keep the floor dry. Clean up any water that spills on the floor as soon as it  happens. ?Remove soap buildup in the tub or shower regularly. ?Attach bath mats securely with double-sided non-slip rug tape. ?Do not have throw rugs and other things on the floor that can make you trip. ?What can I do in the bedroom? ?Use night lights. ?Make sure that you have a light by your bed that is easy to reach. ?Do not use any sheets or blankets that are too big for your bed. They should not hang down onto the floor. ?Have a firm chair that has side arms. You can use this for support while you get dressed. ?Do not have throw rugs and other things on the floor that can make you trip. ?What can I do in the kitchen? ?Clean up any spills right away. ?Avoid walking on wet floors. ?Keep items that you use a lot in easy-to-reach places. ?If you need to reach something above you, use a strong step stool that has a grab bar. ?Keep electrical cords out of the way. ?Do not use floor polish or wax that makes floors slippery. If you must use wax, use non-skid floor wax. ?Do not have throw rugs and other things on the floor that can make you trip. ?What can I do with my stairs? ?Do not leave any items on the stairs. ?Make sure that there are handrails on both sides of the stairs and  use them. Fix handrails that are broken or loose. Make sure that handrails are as long as the stairways. ?Check any carpeting to make sure that it is firmly attached to the stairs. Fix any carpet that is loose or worn. ?Avoid having throw rugs at the top or bottom of the stairs. If you do have throw rugs, attach them to the floor with carpet tape. ?Make sure that you have a light switch at the top of the stairs and the bottom of the stairs. If you do not have them, ask someone to add them for you. ?What else can I do to help prevent falls? ?Wear shoes that: ?Do not have high heels. ?Have rubber bottoms. ?Are comfortable and fit you well. ?Are closed at the toe. Do not wear sandals. ?If you use a stepladder: ?Make sure that it is fully opened.  Do not climb a closed stepladder. ?Make sure that both sides of the stepladder are locked into place. ?Ask someone to hold it for you, if possible. ?Clearly mark and make sure that you can see: ?Any grab b

## 2021-06-19 NOTE — Progress Notes (Signed)
Virtual Visit via Telephone Note ? ?I connected with  [name] Page on 06/19/21 at  9:30 AM EDT by telephone and verified that I am speaking with the correct person using two identifiers. ? ?Medicare Annual Wellness visit completed telephonically due to Covid-19 pandemic.  ? ?Persons participating in this call: This Health Coach and this patient.  ? ?Location: ?Patient: Home  ?Provider: Office  ?  ?I discussed the limitations, risks, security and privacy concerns of performing an evaluation and management service by telephone and the availability of in person appointments. The patient expressed understanding and agreed to proceed. ? ?Unable to perform video visit due to video visit attempted and failed and/or patient does not have video capability.  ? ?Some vital signs may be absent or patient reported.  ? ?Willette Brace, LPN ? ? ?Subjective:  ? [name] Page is a 71 y.o. male who presents for an Initial Medicare Annual Wellness Visit. ? ?Review of Systems    ? ?Cardiac Risk Factors include: advanced age (>53men, >59 women);hypertension;male gender ? ?   ?Objective:  ?  ?There were no vitals filed for this visit. ?There is no height or weight on file to calculate BMI. ? ?Advanced Directives 06/19/2021 12/23/2015 12/23/2015 12/18/2014 12/12/2013  ?Does Patient Have a Medical Advance Directive? No - No No No  ?Does patient want to make changes to medical advance directive? Yes (MAU/Ambulatory/Procedural Areas - Information given) - - - -  ?Would patient like information on creating a medical advance directive? - No - patient declined information No - patient declined information - No - patient declined information  ? ? ?Current Medications (verified) ?Outpatient Encounter Medications as of 06/19/2021  ?Medication Sig  ? Acetylcarnitine HCl (ACETYL L-CARNITINE PO) Take by mouth.  ? Ascorbic Acid (VITAMIN C) 1000 MG tablet   ? atorvastatin (LIPITOR) 40 MG tablet TAKE 1 TABLET BY MOUTH  DAILY  ? b complex vitamins  tablet Take 1 tablet by mouth daily.  ? buPROPion (WELLBUTRIN XL) 300 MG 24 hr tablet TAKE 1 TABLET BY MOUTH  DAILY  ? Cholecalciferol (VITAMIN D) 2000 UNITS CAPS Take 1 capsule by mouth daily.  ? clonazePAM (KLONOPIN) 0.5 MG tablet Take 1 tablet (0.5 mg total) by mouth 2 (two) times daily as needed (do not drive for 12 hours after taking). for anxiety  ? Coenzyme Q10 (CO Q 10 PO) Take 1 capsule by mouth daily.  ? diltiazem (CARDIZEM CD) 180 MG 24 hr capsule TAKE 1 CAPSULE BY MOUTH  DAILY  ? EPINEPHrine 0.3 mg/0.3 mL IJ SOAJ injection INJECT 1 SYRINGE INTO THE MUSCLE AS NEEDED FOR ANAPHYLAXIS  ? gabapentin (NEURONTIN) 300 MG capsule TAKE 2 CAPSULES BY MOUTH AT BEDTIME  ? hydrochlorothiazide (HYDRODIURIL) 25 MG tablet Take 1 tablet (25 mg total) by mouth daily.  ? HYDROcodone-acetaminophen (NORCO/VICODIN) 5-325 MG tablet Take 0.5-1 tablets by mouth 2 (two) times daily as needed. Do not use indicated instructions more than one to three times per week.  ? methylcellulose (CITRUCEL) oral powder Use as directed once daily  ? NEEDLE, DISP, 22 G 22G X 1" MISC Used to inject testosterone weekly  ? potassium chloride (KLOR-CON M) 10 MEQ tablet Take 20 mEq by mouth 2 (two) times daily.  ? Saw Palmetto, Serenoa repens, (SAW PALMETTO PO) Take 1 capsule by mouth 2 (two) times daily.  ? Syringe, Disposable, 1 ML MISC USE AS DIRECTED ONCE A WEEK WITH TESTOSTERONE  ? testosterone cypionate (DEPO-TESTOSTERONE) 100 MG/ML injection Inject 0.3 mLs (  30 mg total) into the muscle every 7 (seven) days. For IM use only  ? Zinc 15 MG CAPS Take by mouth.  ? Influenza Vac High-Dose Quad 0.7 ML SUSY   ? Tdap (BOOSTRIX) 5-2.5-18.5 LF-MCG/0.5 injection Inject into the muscle.  ? Zoster Vaccine Adjuvanted [facility]) injection Inject into the muscle.  ? [DISCONTINUED] colestipol (COLESTID) 1 g tablet Take 1 tablet (1 g total) by mouth 2 (two) times daily.  ? [DISCONTINUED] omeprazole (PRILOSEC) 40 MG capsule Take 1 capsule (40 mg total) by mouth  daily.  ? [DISCONTINUED] Probiotic Product (PROBIOTIC BLEND PO)   ? ?No facility-administered encounter medications on file as of 06/19/2021.  ? ? ?Allergies (verified) ?Bee venom, Flagyl [metronidazole hcl], Nsaids, and Protonix [pantoprazole]  ? ?History: ?Past Medical History:  ?Diagnosis Date  ? Allergic rhinitis, cause unspecified   ? MILD  ? Anxiety   ? Arthritis   ? OA  ? Cancer [facility])   ? lung tumor - NO CHEMO NO RADIATION  ? 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 Cataract   ? forming  ? Depressive disorder, not elsewhere classified   ? Diverticulitis   ? X 1 EPISODE  ? Esophageal reflux   ? past hx   ? HTN (hypertension)   ? Hyperlipidemia   ? CONTROLLED  ? Lymphocytic colitis   ? Neuromuscular disorder ([place])   ? neuropathy  ? Neuropathy   ? Other testicular hypofunction   ? Palpitations   ? Personal history of urinary calculi   ? LAST IN THE 80'S  ? ?Past Surgical History:  ?Procedure Laterality Date  ? COLONOSCOPY    ? 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 [facility] Snare  ? parotid tumor surgery    ? [place] [facility]  ? POLYPECTOMY    ? TONSILLECTOMY AND ADENOIDECTOMY    ? ?Family History  ?Problem Relation Age of Onset  ? Hypertension Mother   ? Anxiety disorder Mother   ? Non-Hodgkin's lymphoma Mother   ? Dementia Father   ? Depression Father   ?     and anxiety  ? Hypertension Father   ? Sudden death Paternal Uncle 74  ? Colon cancer Paternal Uncle   ? Hypertension Brother   ? Early death Neg Hx   ? Heart disease Neg Hx   ?     mother in 33s had lesoin  ? Colon polyps Neg Hx   ? Esophageal cancer Neg Hx   ? Rectal cancer Neg Hx   ? Stomach cancer Neg Hx   ? ?Social History  ? ?Socioeconomic History  ? Marital status: Married  ?  Spouse name: Not on file  ? Number of children: 0  ? Years of education: 67  ? Highest education level: Not on file  ?Occupational History  ? 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  Employer: [employer]  ?  Comment: Fri-Sunday shift  ?Tobacco  Use  ? Smoking status: Never  ? Smokeless tobacco: Never  ? Tobacco comments:  ?  exposed to second hand smoke alot when he was younger  ?Vaping Use  ? Vaping Use: Never used  ?Substance and Sexual Activity  ? Alcohol use: Yes  ?  Comment: rare occ beer with dinner ONE A MONTH  ? Drug use: No  ? Sexual activity: Yes  ?  Partners: Female  ?Other Topics Concern  ? Not on file  ?Social History Narrative  ? 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 Married 30 + years. No children, lots of critters.   ?   ? Cancer Survivor- carcinoid lung cancer.  ? 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 Martin Majestic back to work for ability to be insured.   ?   ? Hobbies: music, home repair/improvement, cats  ? ?Social Determinants of Health  ? ?Financial Resource Strain: Low Risk   ? Difficulty of Paying Living Expenses: Not hard at all  ?Food Insecurity: No Food Insecurity  ? Worried About Charity fundraiser in the Last Year: Never true  ? Ran Out of Food in the Last Year: Never true  ?Transportation Needs: No Transportation Needs  ? Lack of Transportation (Medical): No  ? Lack of Transportation (Non-Medical): No  ?Physical Activity: Sufficiently Active  ? Days of Exercise per Week: 6 days  ? Minutes of Exercise per Session: 50 min  ?Stress: No Stress Concern Present  ? Feeling of Stress : Not at all  ?Social Connections: Socially Isolated  ? Frequency of Communication with Friends and Family: Once a week  ? Frequency of Social Gatherings with Friends and Family: Once a week  ? Attends Religious Services: Never  ? Active Member of Clubs or Organizations: No  ? Attends Archivist Meetings: Never  ? Marital Status: Married  ? ? ?Tobacco Counseling ?Counseling given: Not Answered ?Tobacco comments: exposed to second hand smoke alot when he was younger ? ? ?Clinical Intake: ? ?Pre-visit preparation completed: Yes ? ?Pain : No/denies pain ? ?  ? ?BMI -  recorded: 27.68 ?Nutritional Status: BMI 25 -29 Overweight ?Nutritional Risks: None ?Diabetes: No ? ?How often do you need to have someone help you when you read instructions, pamphlets, or other written materials

## 2021-07-22 ENCOUNTER — Other Ambulatory Visit: Payer: Self-pay

## 2021-08-11 ENCOUNTER — Other Ambulatory Visit: Payer: Self-pay | Admitting: Family Medicine

## 2021-08-17 ENCOUNTER — Other Ambulatory Visit (INDEPENDENT_AMBULATORY_CARE_PROVIDER_SITE_OTHER): Payer: Medicare Other

## 2021-08-17 ENCOUNTER — Other Ambulatory Visit: Payer: Medicare Other

## 2021-08-17 DIAGNOSIS — E291 Testicular hypofunction: Secondary | ICD-10-CM

## 2021-08-17 DIAGNOSIS — D751 Secondary polycythemia: Secondary | ICD-10-CM | POA: Diagnosis not present

## 2021-08-17 DIAGNOSIS — I10 Essential (primary) hypertension: Secondary | ICD-10-CM | POA: Diagnosis not present

## 2021-08-17 DIAGNOSIS — R7301 Impaired fasting glucose: Secondary | ICD-10-CM

## 2021-08-17 LAB — CBC
HCT: 47.3 % (ref 39.0–52.0)
Hemoglobin: 16.4 g/dL (ref 13.0–17.0)
MCHC: 34.8 g/dL (ref 30.0–36.0)
MCV: 94.6 fl (ref 78.0–100.0)
Platelets: 220 10*3/uL (ref 150.0–400.0)
RBC: 5 Mil/uL (ref 4.22–5.81)
RDW: 12.2 % (ref 11.5–15.5)
WBC: 7.4 10*3/uL (ref 4.0–10.5)

## 2021-08-17 LAB — TESTOSTERONE: Testosterone: 399.85 ng/dL (ref 300.00–890.00)

## 2021-08-18 LAB — BASIC METABOLIC PANEL
BUN: 13 mg/dL (ref 6–23)
CO2: 25 mEq/L (ref 19–32)
Calcium: 9.3 mg/dL (ref 8.4–10.5)
Chloride: 101 mEq/L (ref 96–112)
Creatinine, Ser: 0.93 mg/dL (ref 0.40–1.50)
GFR: 82.76 mL/min (ref >60.00)
Glucose, Bld: 126 mg/dL — ABNORMAL HIGH (ref 70–99)
Potassium: 3.7 mEq/L (ref 3.5–5.1)
Sodium: 137 mEq/L (ref 135–145)

## 2021-08-25 ENCOUNTER — Other Ambulatory Visit (HOSPITAL_BASED_OUTPATIENT_CLINIC_OR_DEPARTMENT_OTHER): Payer: Self-pay

## 2021-08-25 ENCOUNTER — Encounter: Payer: Self-pay | Admitting: Endocrinology

## 2021-08-25 ENCOUNTER — Telehealth (INDEPENDENT_AMBULATORY_CARE_PROVIDER_SITE_OTHER): Payer: Medicare Other | Admitting: Endocrinology

## 2021-08-25 DIAGNOSIS — E291 Testicular hypofunction: Secondary | ICD-10-CM | POA: Diagnosis not present

## 2021-08-25 DIAGNOSIS — R7301 Impaired fasting glucose: Secondary | ICD-10-CM | POA: Diagnosis not present

## 2021-08-25 MED ORDER — "NEEDLE (DISP) 22G X 1"" MISC"
2 refills | Status: DC
  Filled 2021-08-25 – 2021-11-21 (×2): qty 12, 84d supply, fill #0
  Filled 2021-11-23: qty 100, 100d supply, fill #0

## 2021-08-25 NOTE — Progress Notes (Signed)
Patient ID: [name] Page, male   DOB: February 11, 1951, 71 y.o.   MRN: [id number]             Chief complaint: Endocrinology follow-up  I connected with the above-named patient by video enabled telemedicine application and verified that I am speaking with the correct person. The patient was explained the limitations of evaluation and management by telemedicine and the availability of in person appointments.  Patient also understood that there may be a patient responsible charge related to this service  Location of the patient: Patient's home  Location of the provider: Physician office Only the patient and myself were participating in the encounter The patient understood the above statements and agreed to proceed.   History of Present Illness   HYPOGONADISM:  Hypogonadism was diagnosed in ?  2012  He at that time had complaints of fatigue, some increased depression, decreased libido although he does not remember symptoms well There is no history of the following: Hot flushes, sweats, breast enlargement, long term anabolic steroid use, history of testicular injury, head injury or mumps in childhood.  No history of osteopenia or low impact fracture  At that time an afternoon testosterone level was 214 and free testosterone level low at 32, normal >47         He was also evaluated by a urologist and details are not available He was treated with testosterone gel but he thinks this did not improve his testosterone levels and likely did not continue treatment  Subsequently he was again evaluated in 2015 and with a low testosterone of 239 he was started on testosterone injections With this he thinks she had some improvement in his fatigue and depression His dose had been adjusted from his initial starting dose of 100 mg every 2 weeks and he has been on various regimens including every 3 or 4 weeks injections His testosterone level had been quite variable since about 2016  RECENT  history:  He has been taking 30 mg of testosterone weekly  Because of his increased hematocrit on his initial consultation he was changed to testosterone injections every week instead of every other week  He has been consistent with his weekly injection, recently on Thursdays Since his testosterone level was getting lower in 02/2019 was told to try 40 mg weekly with with this his testosterone went up to 760  He does not think he has any unusual fatigue recently and is able to exercise He has lost weight Testosterone level is now around 400  Testosterone level was checked about 4 days after his injection  Hemoglobin has been stable in upper normal range  His lab results show testosterone levels as follows:   Lab Results  Component Value Date   TESTOSTERONE 399.85 08/17/2021   TESTOSTERONE 760.38 03/27/2021   TESTOSTERONE 338.73 02/09/2021   TESTOSTERONE 398.07 08/01/2020   Lab Results  Component Value Date   HGB 16.4 08/17/2021    Prolactin level: Normal   Lab Results  Component Value Date   LH <0.20 (L) 04/07/2017   Lab Results  Component Value Date   HGB 16.4 08/17/2021   HGB 16.5 03/27/2021   HGB 15.9 02/09/2021   HGB 16.0 11/12/2020              Allergies as of 08/25/2021       Reactions   Bee Venom Anaphylaxis   Flagyl [metronidazole Hcl] Other (See Comments)   Neuropathy    Nsaids    REACTION: intolereance- ABD  PAIN AND DIARRHEA    Protonix [pantoprazole]    Abdominal Pain and Diarrhea        Medication List        Accurate as of Aug 25, 2021 10:16 AM. If you have any questions, ask your nurse or doctor.          ACETYL L-CARNITINE PO Take by mouth.   atorvastatin 40 MG tablet Commonly known as: LIPITOR TAKE 1 TABLET BY MOUTH  DAILY   b complex vitamins tablet Take 1 tablet by mouth daily.   Boostrix 5-2.5-18.5 LF-MCG/0.5 injection Generic drug: Tdap Inject into the muscle.   buPROPion 300 MG 24 hr tablet Commonly known as:  WELLBUTRIN XL TAKE 1 TABLET BY MOUTH  DAILY   Citrucel oral powder Generic drug: methylcellulose Use as directed once daily   clonazePAM 0.5 MG tablet Commonly known as: KLONOPIN Take 1 tablet (0.5 mg total) by mouth 2 (two) times daily as needed (do not drive for 12 hours after taking). for anxiety   CO Q 10 PO Take 1 capsule by mouth daily.   diltiazem 180 MG 24 hr capsule Commonly known as: CARDIZEM CD TAKE 1 CAPSULE BY MOUTH  DAILY   EPINEPHrine 0.3 mg/0.3 mL Soaj injection Commonly known as: EPI-PEN INJECT 1 SYRINGE INTO THE MUSCLE AS NEEDED FOR ANAPHYLAXIS   gabapentin 300 MG capsule Commonly known as: NEURONTIN TAKE 2 CAPSULES BY MOUTH AT BEDTIME   hydrochlorothiazide 25 MG tablet Commonly known as: HYDRODIURIL Take 1 tablet (25 mg total) by mouth daily.   HYDROcodone-acetaminophen 5-325 MG tablet Commonly known as: NORCO/VICODIN Take 0.5-1 tablets by mouth 2 (two) times daily as needed.; do not exceed more than one to three times per week. (Take 0.5-1 tablets by mouth 2 (two) times daily as needed. Do not use indicated instructions more than one to three times per week.)   Influenza Vac High-Dose Quad 0.7 ML Susy   NEEDLE (DISP) 22 G 22G X 1" Misc Used to inject testosterone weekly   potassium chloride 10 MEQ tablet Commonly known as: KLOR-CON M Take 20 mEq by mouth 2 (two) times daily.   potassium chloride 10 MEQ tablet Commonly known as: KLOR-CON TAKE 3 TABLETS BY MOUTH IN  THE MORNING AND 2 TABLETS  BY MOUTH IN THE AFTERNOON   SAW PALMETTO PO Take 1 capsule by mouth 2 (two) times daily.   Shingrix injection Generic drug: Zoster Vaccine Adjuvanted Inject into the muscle.   Syringe (Disposable) 1 ML Misc USE AS DIRECTED ONCE A WEEK WITH TESTOSTERONE   testosterone cypionate 100 MG/ML injection Commonly known as: Depo-Testosterone Inject 0.3 mLs (30 mg total) into the muscle every 7 (seven) days. For IM use only   vitamin C 1000 MG tablet    Vitamin D 50 MCG (2000 UT) Caps Take 1 capsule by mouth daily.   Zinc 15 MG Caps Take by mouth.        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   Cataract    forming   Depressive disorder, not elsewhere classified    Diverticulitis    X 1 EPISODE   Esophageal reflux    past hx    HTN (hypertension)    Hyperlipidemia    CONTROLLED   Lymphocytic colitis    Neuromuscular disorder (HCC)    neuropathy   Neuropathy    Other testicular hypofunction    Palpitations    Personal history of urinary calculi    LAST IN THE 80'S    Past Surgical History:  Procedure Laterality Date   COLONOSCOPY     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   [name]   parotid tumor surgery     [place]- BENIGN   POLYPECTOMY     TONSILLECTOMY AND ADENOIDECTOMY      Family History  Problem Relation Age of Onset   Hypertension Mother    Anxiety disorder Mother    Non-Hodgkin's lymphoma Mother    Dementia Father    Depression Father        and anxiety   Hypertension Father    Sudden death Paternal Uncle 6   Colon cancer Paternal Uncle    Hypertension Brother    Early death Neg Hx    Heart disease Neg Hx        mother in 24s had lesoin   Colon polyps Neg Hx    Esophageal cancer Neg Hx    Rectal cancer Neg Hx    Stomach cancer Neg Hx     Social History:  reports that he has never smoked. He has never used smokeless tobacco. He reports current alcohol use. He reports that he does not use drugs.  Review of  Systems  IMPAIRED fasting glucose:   Although his A1c is normal usually his fasting glucose may be occasionally high  Fasting glucose on his glucose tolerance test done on 03/31/18 was 105 but 2-hour reading was only 103  Although he was fasting for his recent labs he had a piece of candy before the labs and this was done late morning  He is usually fairly consistent with eating healthy meals He has been able to exercise regularly and weight is down nearly 10 pounds  Wt Readings from Last 3 Encounters:  04/27/21 182 lb (82.6 kg)  04/21/21 182 lb (82.6 kg)  02/23/21 178 lb (80.7 kg)   Lab Results  Component Value Date   GLUCOSE 126 (H) 08/17/2021   GLUCOSE 91 04/17/2021   GLUCOSE 107 (H) 03/27/2021   Lab Results  Component Value Date   HGBA1C 5.6 02/09/2021   HGBA1C 5.5 08/01/2020   HGBA1C 5.5 11/28/2019   Lab Results  Component Value Date   LDLCALC 57 11/12/2020   CREATININE 0.93 08/17/2021    General Examination:   There were no vitals taken for this visit.     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  He is on treatment with Depo-Testosterone once a week, continues to be on a stable dose of taking 30 mg weekly using 0.15 mL of the 200 mg/mL regimen  Subjectively has been doing well  Even with continuing 30 mg of testosterone weekly he does not have any fatigue now Testosterone level is 400 Some of his dosage is limited by tendency to high normal hematocrit   IMPAIRED fasting glucose with normal A1c   A1c levels previously  consistently normal However not clear if he has truly high fasting readings, his glucose was done late morning and after eating a hard candy  LIPIDS: Normal as of 8/22  Plan: Continue 30 mg testosterone injections weekly using the 200 mg/mL preparation  He can have his A1c checked by PCP tomorrow Continue regimen of diet and exercise and only consider metformin if fasting  readings are consistently over 125   Follow-up in 6 months  Atasha Colebank 08/25/2021, 10:16 AM     Note: This office note was prepared with Dragon voice recognition system technology. Any transcriptional errors that result from this process are unintentional.

## 2021-08-26 ENCOUNTER — Ambulatory Visit (INDEPENDENT_AMBULATORY_CARE_PROVIDER_SITE_OTHER): Payer: Medicare Other | Admitting: Family Medicine

## 2021-08-26 ENCOUNTER — Other Ambulatory Visit (HOSPITAL_BASED_OUTPATIENT_CLINIC_OR_DEPARTMENT_OTHER): Payer: Self-pay

## 2021-08-26 ENCOUNTER — Encounter: Payer: Self-pay | Admitting: Family Medicine

## 2021-08-26 VITALS — BP 122/60 | HR 55 | Temp 97.4°F | Ht 68.0 in | Wt 179.4 lb

## 2021-08-26 DIAGNOSIS — Z79899 Other long term (current) drug therapy: Secondary | ICD-10-CM | POA: Diagnosis not present

## 2021-08-26 DIAGNOSIS — G894 Chronic pain syndrome: Secondary | ICD-10-CM | POA: Diagnosis not present

## 2021-08-26 DIAGNOSIS — I10 Essential (primary) hypertension: Secondary | ICD-10-CM

## 2021-08-26 DIAGNOSIS — G609 Hereditary and idiopathic neuropathy, unspecified: Secondary | ICD-10-CM | POA: Diagnosis not present

## 2021-08-26 DIAGNOSIS — F411 Generalized anxiety disorder: Secondary | ICD-10-CM

## 2021-08-26 DIAGNOSIS — R739 Hyperglycemia, unspecified: Secondary | ICD-10-CM | POA: Diagnosis not present

## 2021-08-26 DIAGNOSIS — F3341 Major depressive disorder, recurrent, in partial remission: Secondary | ICD-10-CM

## 2021-08-26 DIAGNOSIS — I700 Atherosclerosis of aorta: Secondary | ICD-10-CM | POA: Diagnosis not present

## 2021-08-26 LAB — POCT GLYCOSYLATED HEMOGLOBIN (HGB A1C): Hemoglobin A1C: 5.1 % (ref 4.0–5.6)

## 2021-08-26 MED ORDER — HYDROCODONE-ACETAMINOPHEN 5-325 MG PO TABS
0.5000 | ORAL_TABLET | Freq: Two times a day (BID) | ORAL | 0 refills | Status: DC | PRN
  Filled 2021-08-26: qty 55, 28d supply, fill #0

## 2021-08-26 NOTE — Progress Notes (Signed)
Phone [phone] In person visit   Subjective:   [name] Page is a 71 y.o. year old very pleasant male patient who presents for/with See problem oriented charting Chief Complaint  Patient presents with   Follow-up   Hypertension   Back Pain    Pt c/o back pain and neuropathy.   Past Medical History-  Patient Active Problem List   Diagnosis Date Noted   Chronic pain syndrome 08/31/2016    Priority: High   History of lung cancer 12/11/2012    Priority: High   Aortic atherosclerosis ([place]) 11/03/2020    Priority: Medium    History of adenomatous polyp of colon 03/24/2018    Priority: Medium    Generalized anxiety disorder 03/12/2014    Priority: Medium    Essential hypertension 03/12/2014    Priority: Medium    Peripheral neuropathy, idiopathic 12/22/2012    Priority: Medium    Dyspnea 07/14/2012    Priority: Medium    Hypertriglyceridemia 07/14/2012    Priority: Medium    Depression 02/16/2010    Priority: Medium    Low testosterone 11/21/2007    Priority: Medium    Chronic bilateral low back pain 06/05/2019    Priority: Low   Venous stasis of both lower extremities 06/05/2019    Priority: Low   Mastoiditis 03/09/2017    Priority: Low   Palpitations 08/11/2016    Priority: Low   Eustachian tube dysfunction 12/17/2014    Priority: Low   SI (sacroiliac) joint dysfunction 09/19/2013    Priority: Low   Nonspecific abnormal electrocardiogram (ECG) (EKG) 07/14/2012    Priority: Low   Allergic reaction, history of 10/25/2011    Priority: Low   Rectal or anal pain 10/01/2010    Priority: Low   Allergic rhinitis 06/13/2007    Priority: Low   GERD 06/13/2007    Priority: Low   IBS (irritable bowel syndrome) 06/13/2007    Priority: Low   Nonallopathic lesion of sacral region 09/19/2013   Nonallopathic lesion of thoracic region 09/19/2013   Nonallopathic lesion of lumbosacral region 09/19/2013    Medications- reviewed and updated Current Outpatient  Medications  Medication Sig Dispense Refill   Acetylcarnitine HCl (ACETYL L-CARNITINE PO) Take by mouth.     Ascorbic Acid (VITAMIN C) 1000 MG tablet      atorvastatin (LIPITOR) 40 MG tablet TAKE 1 TABLET BY MOUTH  DAILY 90 tablet 3   b complex vitamins tablet Take 1 tablet by mouth daily.     buPROPion (WELLBUTRIN XL) 300 MG 24 hr tablet TAKE 1 TABLET BY MOUTH  DAILY 90 tablet 3   Cholecalciferol (VITAMIN D) 2000 UNITS CAPS Take 1 capsule by mouth daily.     clonazePAM (KLONOPIN) 0.5 MG tablet Take 1 tablet (0.5 mg total) by mouth 2 (two) times daily as needed (do not drive for 12 hours after taking). for anxiety 180 tablet 1   Coenzyme Q10 (CO Q 10 PO) Take 1 capsule by mouth daily.     diltiazem (CARDIZEM CD) 180 MG 24 hr capsule TAKE 1 CAPSULE BY MOUTH  DAILY 90 capsule 3   EPINEPHrine 0.3 mg/0.3 mL IJ SOAJ injection INJECT 1 SYRINGE INTO THE MUSCLE AS NEEDED FOR ANAPHYLAXIS 2 each 1   gabapentin (NEURONTIN) 300 MG capsule TAKE 2 CAPSULES BY MOUTH AT BEDTIME 180 capsule 3   hydrochlorothiazide (HYDRODIURIL) 25 MG tablet Take 1 tablet (25 mg total) by mouth daily. 90 tablet 3   methylcellulose (CITRUCEL) oral powder Use as directed  once daily     NEEDLE, DISP, 22 G 22G X 1" MISC Used to inject testosterone weekly 50 each 2   potassium chloride (KLOR-CON) 10 MEQ tablet TAKE 3 TABLETS BY MOUTH IN  THE MORNING AND 2 TABLETS  BY MOUTH IN THE AFTERNOON 500 tablet 2   Saw Palmetto, Serenoa repens, (SAW PALMETTO PO) Take 1 capsule by mouth 2 (two) times daily.     Syringe, Disposable, 1 ML MISC USE AS DIRECTED ONCE A WEEK WITH TESTOSTERONE 25 each 3   testosterone cypionate (DEPO-TESTOSTERONE) 100 MG/ML injection Inject 0.3 mLs (30 mg total) into the muscle every 7 (seven) days. For IM use only 10 mL 2   Zinc 15 MG CAPS Take by mouth.     HYDROcodone-acetaminophen (NORCO/VICODIN) 5-325 MG tablet Take 0.5-1 tablets by mouth 2 (two) times daily as needed. Do not use indicated instructions more than  one to three times per week. 55 tablet 0   No current facility-administered medications for this visit.     Objective:  BP 122/60   Pulse (!) 55   Temp (!) 97.4 F (36.3 C)   Ht 5\' 8"  (1.727 m)   Wt 179 lb 6.4 oz (81.4 kg)   SpO2 98%   BMI 27.28 kg/m  Gen: NAD, resting comfortably CV: RRR no murmurs rubs or gallops Lungs: CTAB no crackles, wheeze, rhonchi Abdomen: soft/nontender/nondistended/normal bowel sounds. No rebound or guarding.  Ext: no edema Skin: warm, dry Neuro: grossly normal, moves all extremities     Assessment and Plan    #History of lung cancer/actually carcinoid tumor malignant-we follow an annual chest x-ray on him.  Last chest x-ray 05/01/2021.  Also prior with CT chest abdomen pelvis 03/05/2020   #Chronic pain syndrome/neuropathy S: Medication: Gabapentin 600 mg at bedtime, sparing hydrocodone twice a week.  He knows to space hydrocodone and clonazepam at least 12 hours  -has been able to do treadmill lately- prior swimming (didn't tolerate chlorine) or biking. Back has bothered him more and seeing chiropractor- making some supplement adjustments A/P: neuropathy overall stable- back pain somewhat worse. Refill meds- continue to work with chiropractor  -PDMP reviewed and low risk trend noted-prescriptions only through me hydrocodone and clonazepam-testosterone with endocrinology -UDS last updated 06/17/20-we will update today -Controlled substance contract on file from4/08/2017  #hypertension/hypokalemia requiring potassium 20 mEq twice daily S: medication: Chlorthalidone 25 mg, diltiazem 180 mg extended release (amlodipine caused edema in past).  Some elevations in the past and have considered increasing diltiazem to 240 mg  - did with activity get down to 49 - actually improved after a shower -treadmill 4-5 days a week 40-60 minutes BP Readings from Last 3 Encounters:  08/26/21 122/60  04/27/21 130/62  04/21/21 126/64   A/P:  Controlled. Continue current  medications.   #Hypertriglyceridemia/aortic atherosclerosis on the other hand with coronary calcium score of 0 on 07/08/2020 I#Hypertriglyceridemia S: Medication:Atorvastatin 40 mg- only takes half  Had microvascular ischemia on prior MR Lab Results  Component Value Date   CHOL 116 11/12/2020   HDL 46.50 11/12/2020   LDLCALC 57 11/12/2020   TRIG 60.0 11/12/2020   CHOLHDL 2 11/12/2020    A/P: Excellent control even with aortic atherosclerosis history and microvascular ischemia with LDL under 70-continue current medication   # Depression/GAD S: Medication:Wellbutrin 3000 mg XR .  Uses clonazepam 1-2 times a day-we have discussed trying to cut back.  Has not tolerated SSRIs due to palpitations -cat has been very ill and felt like not  concentrating as well    08/26/2021   10:14 AM 06/19/2021    9:42 AM 04/27/2021   10:00 AM  Depression screen PHQ 2/9  Decreased Interest 1 0 1  Down, Depressed, Hopeless 1 1 0  PHQ - 2 Score 2 1 1   Altered sleeping 3  2  Tired, decreased energy 1  2  Change in appetite 0  0  Feeling bad or failure about yourself  1  0  Trouble concentrating 2  0  Moving slowly or fidgety/restless 0  0  Suicidal thoughts 0  0  PHQ-9 Score 9  5  Difficult doing work/chores Somewhat difficult  Not difficult at all  A/P: overall stable- continue current medicine. Is going through situational stress with cat. Some focus issues- discussed some memory retention strategies  #Hypogonadism-on testosterone treatment withendocrinology Dr. Dwyane Dee  #Labs ordered by endocrinology recently reviewed today-thankfully potassium okay  - a1c today of 5.1 but adjusting for our machine running a little lower- stbale with prior 5.6   #HM- plans for shingrix today and will send [place] the date  Recommended follow up: Return in about 4 months (around 12/27/2021) for physical or sooner if needed.Schedule b4 you leave. Future Appointments  Date Time Provider [place]  07/02/2022 10:00 AM  [facility]    Lab/Order associations:   ICD-10-CM   1. Chronic pain syndrome  G89.4 DRUG MONITORING, PANEL 8 WITH CONFIRMATION, URINE    2. Peripheral neuropathy, idiopathic  G60.9     3. Generalized anxiety disorder  F41.1     4. Essential hypertension  I10     5. Recurrent major depressive disorder, in partial remission ([place])  F33.41     6. Aortic atherosclerosis (HCC)  I70.0     7. High risk medication use  Z79.899 DRUG MONITORING, PANEL 8 WITH CONFIRMATION, URINE    8. Hyperglycemia  R73.9 POCT HgB A1C      Meds ordered this encounter  Medications   HYDROcodone-acetaminophen (NORCO/VICODIN) 5-325 MG tablet    Sig: Take 0.5-1 tablets by mouth 2 (two) times daily as needed. Do not use indicated instructions more than one to three times per week.    Dispense:  55 tablet    Refill:  0    Return precautions advised.  [name], MD

## 2021-08-26 NOTE — Patient Instructions (Addendum)
Please stop by lab before you go If you have mychart- we will send your results within 3 business days of [place] receiving them.  If you do not have mychart- we will call you about results within 5 business days of [place] receiving them.  *please also note that you will see labs on mychart as soon as they post. I will later go in and write notes on them- will say "notes from Dr. Yong Channel"   A1c 5.1 but likely stable with 5.6 on phlebotomy previously  Recommended follow up: Return in about 4 months (around 12/27/2021) for physical or sooner if needed.Schedule b4 you leave.

## 2021-08-27 ENCOUNTER — Other Ambulatory Visit (HOSPITAL_BASED_OUTPATIENT_CLINIC_OR_DEPARTMENT_OTHER): Payer: Self-pay

## 2021-08-28 LAB — DRUG MONITORING, PANEL 8 WITH CONFIRMATION, URINE
6 Acetylmorphine: NEGATIVE ng/mL (ref <10)
Alcohol Metabolites: NEGATIVE ng/mL (ref <500)
Amphetamines: NEGATIVE ng/mL (ref <500)
Benzodiazepines: NEGATIVE ng/mL (ref <100)
Buprenorphine, Urine: NEGATIVE ng/mL (ref <5)
Cocaine Metabolite: NEGATIVE ng/mL (ref <150)
Codeine: NEGATIVE ng/mL (ref <50)
Creatinine: 38.1 mg/dL (ref >=20.0)
Hydrocodone: 141 ng/mL — ABNORMAL HIGH (ref <50)
Hydromorphone: NEGATIVE ng/mL (ref <50)
MDMA: NEGATIVE ng/mL (ref <500)
Marijuana Metabolite: NEGATIVE ng/mL (ref <20)
Morphine: NEGATIVE ng/mL (ref <50)
Norhydrocodone: 104 ng/mL — ABNORMAL HIGH (ref <50)
Opiates: POSITIVE ng/mL — AB (ref <100)
Oxidant: NEGATIVE ug/mL (ref <200)
Oxycodone: NEGATIVE ng/mL (ref <100)
pH: 5.4 (ref 4.5–9.0)

## 2021-08-28 LAB — DM TEMPLATE

## 2021-09-01 ENCOUNTER — Other Ambulatory Visit (HOSPITAL_BASED_OUTPATIENT_CLINIC_OR_DEPARTMENT_OTHER): Payer: Self-pay

## 2021-10-27 ENCOUNTER — Other Ambulatory Visit (HOSPITAL_BASED_OUTPATIENT_CLINIC_OR_DEPARTMENT_OTHER): Payer: Self-pay

## 2021-11-21 ENCOUNTER — Other Ambulatory Visit: Payer: Self-pay | Admitting: Family Medicine

## 2021-11-23 ENCOUNTER — Other Ambulatory Visit (HOSPITAL_BASED_OUTPATIENT_CLINIC_OR_DEPARTMENT_OTHER): Payer: Self-pay

## 2021-11-23 ENCOUNTER — Encounter (HOSPITAL_BASED_OUTPATIENT_CLINIC_OR_DEPARTMENT_OTHER): Payer: Self-pay

## 2021-11-24 ENCOUNTER — Other Ambulatory Visit (HOSPITAL_BASED_OUTPATIENT_CLINIC_OR_DEPARTMENT_OTHER): Payer: Self-pay

## 2021-11-24 MED ORDER — HYDROCODONE-ACETAMINOPHEN 5-325 MG PO TABS
0.5000 | ORAL_TABLET | Freq: Two times a day (BID) | ORAL | 0 refills | Status: DC | PRN
  Filled 2021-11-24: qty 55, 28d supply, fill #0

## 2021-11-24 MED ORDER — CLONAZEPAM 0.5 MG PO TABS
0.5000 mg | ORAL_TABLET | Freq: Two times a day (BID) | ORAL | 1 refills | Status: DC | PRN
  Filled 2021-11-24: qty 180, 90d supply, fill #0
  Filled 2022-02-12 – 2022-02-20 (×2): qty 180, 90d supply, fill #1
  Filled ????-??-??: fill #1

## 2021-11-25 ENCOUNTER — Other Ambulatory Visit (HOSPITAL_BASED_OUTPATIENT_CLINIC_OR_DEPARTMENT_OTHER): Payer: Self-pay

## 2021-11-25 ENCOUNTER — Other Ambulatory Visit: Payer: Self-pay | Admitting: Endocrinology

## 2021-11-25 ENCOUNTER — Other Ambulatory Visit: Payer: Self-pay | Admitting: Family Medicine

## 2021-12-01 ENCOUNTER — Other Ambulatory Visit: Payer: Self-pay | Admitting: Family Medicine

## 2021-12-06 ENCOUNTER — Other Ambulatory Visit: Payer: Self-pay | Admitting: Family Medicine

## 2021-12-15 ENCOUNTER — Encounter (HOSPITAL_COMMUNITY): Payer: Self-pay

## 2021-12-15 ENCOUNTER — Emergency Department (HOSPITAL_COMMUNITY): Payer: Medicare Other

## 2021-12-15 ENCOUNTER — Encounter (HOSPITAL_BASED_OUTPATIENT_CLINIC_OR_DEPARTMENT_OTHER): Payer: Self-pay

## 2021-12-15 ENCOUNTER — Emergency Department (HOSPITAL_BASED_OUTPATIENT_CLINIC_OR_DEPARTMENT_OTHER): Payer: Medicare Other

## 2021-12-15 ENCOUNTER — Emergency Department (HOSPITAL_BASED_OUTPATIENT_CLINIC_OR_DEPARTMENT_OTHER)
Admission: EM | Admit: 2021-12-15 | Discharge: 2021-12-16 | Disposition: A | Discharge status: Home / Self Care | Payer: Medicare Other | Attending: Emergency Medicine | Admitting: Emergency Medicine

## 2021-12-15 DIAGNOSIS — I10 Essential (primary) hypertension: Secondary | ICD-10-CM | POA: Diagnosis not present

## 2021-12-15 DIAGNOSIS — R42 Dizziness and giddiness: Secondary | ICD-10-CM | POA: Diagnosis not present

## 2021-12-15 DIAGNOSIS — Z85118 Personal history of other malignant neoplasm of bronchus and lung: Secondary | ICD-10-CM | POA: Insufficient documentation

## 2021-12-15 DIAGNOSIS — I6782 Cerebral ischemia: Secondary | ICD-10-CM | POA: Insufficient documentation

## 2021-12-15 DIAGNOSIS — Z79899 Other long term (current) drug therapy: Secondary | ICD-10-CM | POA: Insufficient documentation

## 2021-12-15 DIAGNOSIS — R002 Palpitations: Secondary | ICD-10-CM | POA: Insufficient documentation

## 2021-12-15 DIAGNOSIS — R519 Headache, unspecified: Secondary | ICD-10-CM | POA: Diagnosis not present

## 2021-12-15 LAB — CBC
HCT: 48 % (ref 39.0–52.0)
Hemoglobin: 17 g/dL (ref 13.0–17.0)
MCH: 32.8 pg (ref 26.0–34.0)
MCHC: 35.4 g/dL (ref 30.0–36.0)
MCV: 92.7 fL (ref 80.0–100.0)
Platelets: 219 10*3/uL (ref 150–400)
RBC: 5.18 MIL/uL (ref 4.22–5.81)
RDW: 11.7 % (ref 11.5–15.5)
WBC: 10.2 10*3/uL (ref 4.0–10.5)
nRBC: 0 % (ref 0.0–0.2)

## 2021-12-15 LAB — URINALYSIS, ROUTINE W REFLEX MICROSCOPIC
Bilirubin Urine: NEGATIVE
Glucose, UA: NEGATIVE mg/dL
Hgb urine dipstick: NEGATIVE
Ketones, ur: NEGATIVE mg/dL
Leukocytes,Ua: NEGATIVE
Nitrite: NEGATIVE
Protein, ur: NEGATIVE mg/dL
Specific Gravity, Urine: 1.008 (ref 1.005–1.030)
pH: 5.5 (ref 5.0–8.0)

## 2021-12-15 LAB — BASIC METABOLIC PANEL
Anion gap: 12 (ref 5–15)
BUN: 16 mg/dL (ref 8–23)
CO2: 27 mmol/L (ref 22–32)
Calcium: 10.1 mg/dL (ref 8.9–10.3)
Chloride: 100 mmol/L (ref 98–111)
Creatinine, Ser: 1.09 mg/dL (ref 0.61–1.24)
GFR, Estimated: >60 mL/min (ref >60)
Glucose, Bld: 185 mg/dL — ABNORMAL HIGH (ref 70–99)
Potassium: 3.8 mmol/L (ref 3.5–5.1)
Sodium: 139 mmol/L (ref 135–145)

## 2021-12-15 LAB — CBG MONITORING, ED: Glucose-Capillary: 173 mg/dL — ABNORMAL HIGH (ref 70–99)

## 2021-12-15 LAB — TROPONIN I (HIGH SENSITIVITY): Troponin I (High Sensitivity): 7 ng/L (ref <18)

## 2021-12-15 NOTE — ED Triage Notes (Signed)
Pt presents to the ED with wife. Reports dizziness that has been going on for a month with gradual worsening. Reports palpitations that started tonight. Does have a hx of anxiety, mastoiditis, and vertigo. Pt states that pta he felt like he was going to pass out. Also reports feeling nauseated. Pt A&Ox4 at time of triage. VSS.

## 2021-12-15 NOTE — Discharge Instructions (Addendum)
You were seen in the emergency department today for dizziness.  Your work-up was overall reassuring.  We are sending you home with a prolonged prednisone taper per the recommendations discussed.    We have prescribed you new medication(s) today. Discuss the medications prescribed today with your pharmacist as they can have adverse effects and interactions with your other medicines including over the counter and prescribed medications. Seek medical evaluation if you start to experience new or abnormal symptoms after taking one of these medicines, seek care immediately if you start to experience difficulty breathing, feeling of your throat closing, facial swelling, or rash as these could be indications of a more serious allergic reaction  Please follow-up with your primary care provider and ENT for your chronic mastoid effusion and dizziness.  You can see your results of your MRI on MyChart.  We will call you if there is any significant abnormalities as discussed.  Return to the emergency department for any new or worsening symptoms or any other concerns.

## 2021-12-15 NOTE — ED Provider Notes (Incomplete)
[facility] EMERGENCY DEPT Provider Note   CSN: [id number] Arrival date & time: 12/15/21  2033     History {Add pertinent medical, surgical, social history, OB history to HPI:1} Chief Complaint  Patient presents with  . Dizziness  . Palpitations    [name] Page is a 71 y.o. male.  71 year old male with a history of lung cancer, hypertension, peripheral neuropathy, anxiety, and peripheral vertigo who presents to the emergency department with dizziness.  Patient states that for the past month he has had intermittent dizziness.  Says that is now limiting his ability to walk.  Says it is both present at rest and when he moves his head.  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  Says he has mild headache at this time that is been present for the past week.  Said that he was worked up for this previously which they initially believed was due to mastoiditis.  Says that he has known vertebral artery stenosis as well.  Denies any head trauma or neck pain.  Not on anticoagulation.  No history of stroke.   Dizziness Associated symptoms: palpitations   Palpitations Associated symptoms: dizziness        Home Medications Prior to Admission medications   Medication Sig Start Date End Date Taking? Authorizing Provider  Acetylcarnitine HCl (ACETYL L-CARNITINE PO) Take by mouth.    [provider]  Ascorbic Acid (VITAMIN C) 1000 MG tablet  04/05/20   [provider]  atorvastatin (LIPITOR) 40 MG tablet TAKE 1 TABLET BY MOUTH DAILY 12/01/21   Marin Olp, MD  b complex vitamins tablet Take 1 tablet by mouth daily.    [provider]  buPROPion (WELLBUTRIN XL) 300 MG 24 hr tablet TAKE 1 TABLET BY MOUTH  DAILY 03/03/21   Marin Olp, MD  Cholecalciferol (VITAMIN D) 2000 UNITS CAPS Take 1 capsule by mouth daily.    [provider]  clonazePAM (KLONOPIN) 0.5 MG tablet Take 1 tablet (0.5 mg total) by mouth 2 (two) times daily as needed (do not drive for 12 hours after taking). for anxiety 11/24/21   Marin Olp, MD  Coenzyme Q10 (CO Q 10 PO) Take 1 capsule by mouth daily.    [provider]  diltiazem (CARDIZEM CD) 180 MG 24 hr capsule TAKE 1 CAPSULE BY MOUTH DAILY 12/08/21   Marin Olp, MD  gabapentin (NEURONTIN) 300 MG capsule TAKE 2 CAPSULES BY MOUTH AT  BEDTIME 12/08/21   Marin Olp, MD  hydrochlorothiazide (HYDRODIURIL) 25 MG tablet Take 1 tablet (25 mg total) by mouth daily. 03/23/21   Marin Olp, MD  HYDROcodone-acetaminophen (NORCO/VICODIN) 5-325 MG tablet Take 0.5-1 tablets by mouth 2 (two) times daily as needed. Do not use indicated instructions more than one to three times per week. 11/24/21   Marin Olp, MD  methylcellulose (CITRUCEL) oral powder Use as directed once daily 04/21/21   Armbruster, Carlota Raspberry, MD  NEEDLE, DISP, 22 G 22G X 1" MISC Used to inject testosterone weekly 08/25/21   Elayne Snare, MD  potassium chloride (KLOR-CON) 10 MEQ tablet TAKE 3 TABLETS BY MOUTH IN  THE MORNING AND 2 TABLETS  BY MOUTH IN THE AFTERNOON 08/11/21   Marin Olp, MD  Saw Palmetto, Serenoa repens, (SAW PALMETTO PO) Take 1 capsule by mouth 2 (two) times daily.  [provider]  Syringe, Disposable, 1 ML MISC USE AS DIRECTED ONCE A WEEK WITH TESTOSTERONE 03/20/21 03/20/22  Elayne Snare, MD  testosterone cypionate (DEPOTESTOSTERONE CYPIONATE) 200 MG/ML injection INJECT INTRAMUSCULARLY 0.3  ML WEEKLY (DISCARD UNUSED  AFTER FIRST USE) 11/25/21   Elayne Snare, MD  Zinc 15 MG CAPS Take by mouth.    [provider]  Zoster Vaccine Adjuvanted [facility]) injection Inject into the muscle. 04/21/21   Carlyle Basques, MD  omeprazole (PRILOSEC) 40 MG capsule Take 1 capsule (40 mg total) by mouth daily. 06/10/20 06/17/20  Marin Olp, MD      Allergies    Bee venom,  Flagyl [metronidazole hcl], Nsaids, and Protonix [pantoprazole]    Review of Systems   Review of Systems  Cardiovascular:  Positive for palpitations.  Neurological:  Positive for dizziness.    Physical Exam Updated Vital Signs BP (!) 171/65   Pulse 71   Temp 97.7 F (36.5 C)   Resp (!) 23   Ht 5\' 8"  (1.727 m)   Wt 79.4 kg   SpO2 95%   BMI 26.61 kg/m  Physical Exam Vitals and nursing note reviewed.  Constitutional:      General: He is not in acute distress.    Appearance: He is well-developed.  HENT:     Head: Normocephalic and atraumatic.     Right Ear: Tympanic membrane and external ear normal.     Left Ear: Tympanic membrane and external ear normal.     Nose: Nose normal.     Mouth/Throat:     Mouth: Mucous membranes are moist.     Pharynx: Oropharynx is clear.  Eyes:     Extraocular Movements: Extraocular movements intact.     Conjunctiva/sclera: Conjunctivae normal.     Pupils: Pupils are equal, round, and reactive to light.  Cardiovascular:     Rate and Rhythm: Normal rate and regular rhythm.     Heart sounds: Normal heart sounds.  Pulmonary:     Effort: Pulmonary effort is normal. No respiratory distress.     Breath sounds: Normal breath sounds.  Abdominal:     General: There is no distension.     Palpations: Abdomen is soft. There is no mass.     Tenderness: There is no abdominal tenderness. There is no guarding.  Musculoskeletal:        General: No swelling.     Cervical back: Normal range of motion and neck supple.     Right lower leg: No edema.     Left lower leg: No edema.  Skin:    General: Skin is warm and dry.     Capillary Refill: Capillary refill takes less than 2 seconds.  Neurological:     Mental Status: He is alert.     Comments: MENTAL STATUS: AAOx3 CRANIAL NERVES: II: Pupils equal and reactive 4 mm BL, no RAPD, no VF deficits III, IV, VI: EOM intact, no gaze preference or deviation, no nystagmus. V: normal sensation to light touch in  V1, V2, and V3 segments bilaterally VII: no facial weakness or asymmetry, no nasolabial fold flattening VIII: normal hearing to speech and finger friction IX, X: normal palatal elevation, no uvular deviation XI: 5/5 head turn and 5/5 shoulder shrug bilaterally XII: midline tongue protrusion MOTOR: 5/5 strength in R shoulder flexion, elbow flexion and extension, and grip strength. 5/5 strength in L shoulder flexion, elbow flexion and extension, and grip strength.  5/5 strength in R hip and knee flexion, knee  extension, ankle plantar and dorsiflexion. 5/5 strength in L hip and knee flexion, knee extension, ankle plantar and dorsiflexion. SENSORY: Normal sensation to light touch in all extremities COORD: Normal finger to nose and heel to shin, no tremor, no dysmetria STATION: normal stance, no truncal ataxia GAIT: mild ataxia   Psychiatric:        Mood and Affect: Mood normal.        Behavior: Behavior normal.     ED Results / Procedures / Treatments   Labs (all labs ordered are listed, but only abnormal results are displayed) Labs Reviewed  BASIC METABOLIC PANEL - Abnormal; Notable for the following components:      Result Value   Glucose, Bld 185 (*)    All other components within normal limits  CBG MONITORING, ED - Abnormal; Notable for the following components:   Glucose-Capillary 173 (*)    All other components within normal limits  CBC  URINALYSIS, ROUTINE W REFLEX MICROSCOPIC  TROPONIN I (HIGH SENSITIVITY)    EKG EKG Interpretation  Date/Time:  Tuesday December 15 2021 20:39:38 EDT Ventricular Rate:  80 PR Interval:  218 QRS Duration: 94 QT Interval:  368 QTC Calculation: 424 R Axis:   22 Text Interpretation: Sinus rhythm with 1st degree A-V block with Premature atrial complexes Incomplete right bundle branch block Borderline ECG When compared with ECG of 01-Aug-2012 11:40, Premature atrial complexes are now Present Confirmed by Margaretmary Eddy [phone]) on 12/15/2021  10:06:57 PM  Radiology No results found.  Procedures Procedures   Medications Ordered in ED Medications - No data to display  ED Course/ Medical Decision Making/ A&P Clinical Course as of 12/15/21 2359  Tue Dec 15, 2021  2231 Spoke with Neuro Dr Lorrin Goodell. Feels that given the chronicity and intermittent symptoms that stroke is unlikely. Feels that ct head would show stroke at this time but would recommend [facility]. Steroids could be given 60mg  7 days then 10 mg taper q2d. Could also try meclizine.  [RP]    Clinical Course User Index [RP] Fransico Meadow, MD                           Medical Decision Making Amount and/or Complexity of Data Reviewed Labs: ordered. Radiology: ordered.   JANATHAN BRIBIESCA is a 71 year old male with a history of lung cancer, hypertension, peripheral neuropathy, anxiety, and peripheral vertigo who presents to the emergency department with dizziness.   Initial Ddx:  ***   MDM:  ***  Plan:  ***  ED Summary:  ***  Dispo: {Disposition:28069}   Additional history obtained from {Additional History:28067} Records reviewed {Records Reviewed:28068} The following labs were independently interpreted: {labs interpreted:28064} I independently visualized the following imaging with scope of interpretation limited to determining acute life threatening conditions related to emergency care: {imaging interpreted:28065}, which revealed {No acute abnormality:28066}  Consults: {Consultants:28063} Social Determinants of health:  ***  {Document critical care time when appropriate:1} {Document POCUS if Performed:1}   {Document critical care time when appropriate:1} {Document review of labs and clinical decision tools ie heart score, Chads2Vasc2 etc:1}  {Document your independent review of radiology images, and any outside records:1} {Document your discussion with family members, caretakers, and with consultants:1} {Document social  determinants of health affecting pt's care:1} {Document your decision making why or why not admission, treatments were needed:1} Final Clinical Impression(s) / ED Diagnoses Final diagnoses:  None    Rx / DC Orders ED Discharge Orders  None

## 2021-12-15 NOTE — ED Provider Notes (Signed)
[facility] EMERGENCY DEPT Provider Note   CSN: [id number] Arrival date & time: 12/15/21  2033     History {Add pertinent medical, surgical, social history, OB history to HPI:1} Chief Complaint  Patient presents with   Dizziness   Palpitations    [name] Page is a 71 y.o. male.   Dizziness Associated symptoms: palpitations   Palpitations Associated symptoms: dizziness        Home Medications Prior to Admission medications   Medication Sig Start Date End Date Taking? Authorizing Provider  Acetylcarnitine HCl (ACETYL L-CARNITINE PO) Take by mouth.    [provider]  Ascorbic Acid (VITAMIN C) 1000 MG tablet  04/05/20   [provider]  atorvastatin (LIPITOR) 40 MG tablet TAKE 1 TABLET BY MOUTH DAILY 12/01/21   Marin Olp, MD  b complex vitamins tablet Take 1 tablet by mouth daily.    [provider]  buPROPion (WELLBUTRIN XL) 300 MG 24 hr tablet TAKE 1 TABLET BY MOUTH  DAILY 03/03/21   Marin Olp, MD  Cholecalciferol (VITAMIN D) 2000 UNITS CAPS Take 1 capsule by mouth daily.    [provider]  clonazePAM (KLONOPIN) 0.5 MG tablet Take 1 tablet (0.5 mg total) by mouth 2 (two) times daily as needed (do not drive for 12 hours after taking). for anxiety 11/24/21   Marin Olp, MD  Coenzyme Q10 (CO Q 10 PO) Take 1 capsule by mouth daily.    [provider]  diltiazem (CARDIZEM CD) 180 MG 24 hr capsule TAKE 1 CAPSULE BY MOUTH DAILY 12/08/21   Marin Olp, MD  gabapentin (NEURONTIN) 300 MG capsule TAKE 2 CAPSULES BY MOUTH AT  BEDTIME 12/08/21   Marin Olp, MD  hydrochlorothiazide (HYDRODIURIL) 25 MG tablet Take 1 tablet (25 mg total) by mouth daily. 03/23/21   Marin Olp, MD  HYDROcodone-acetaminophen (NORCO/VICODIN) 5-325 MG tablet Take 0.5-1 tablets by mouth 2 (two) times daily as needed. Do not use indicated instructions more than one to three times per week. 11/24/21   Marin Olp,  MD  methylcellulose (CITRUCEL) oral powder Use as directed once daily 04/21/21   Armbruster, Carlota Raspberry, MD  NEEDLE, DISP, 22 G 22G X 1" MISC Used to inject testosterone weekly 08/25/21   Elayne Snare, MD  potassium chloride (KLOR-CON) 10 MEQ tablet TAKE 3 TABLETS BY MOUTH IN  THE MORNING AND 2 TABLETS  BY MOUTH IN THE AFTERNOON 08/11/21   Marin Olp, MD  Saw Palmetto, Serenoa repens, (SAW PALMETTO PO) Take 1 capsule by mouth 2 (two) times daily.    [provider]  Syringe, Disposable, 1 ML MISC USE AS DIRECTED ONCE A WEEK WITH TESTOSTERONE 03/20/21 03/20/22  Elayne Snare, MD  testosterone cypionate (DEPOTESTOSTERONE CYPIONATE) 200 MG/ML injection INJECT INTRAMUSCULARLY 0.3  ML WEEKLY (DISCARD UNUSED  AFTER FIRST USE) 11/25/21   Elayne Snare, MD  Zinc 15 MG CAPS Take by mouth.    [provider]  Zoster Vaccine Adjuvanted [facility]) injection Inject into the muscle. 04/21/21   Carlyle Basques, MD  omeprazole (PRILOSEC) 40 MG capsule Take 1 capsule (40 mg total) by mouth daily. 06/10/20 06/17/20  Marin Olp, MD      Allergies    Bee venom, Flagyl [metronidazole hcl], Nsaids, and Protonix [pantoprazole]    Review of Systems   Review of Systems  Cardiovascular:  Positive for palpitations.  Neurological:  Positive for dizziness.    Physical Exam Updated Vital Signs BP (!) 171/65  Pulse 71   Temp 97.7 F (36.5 C)   Resp (!) 23   Ht 5\' 8"  (1.727 m)   Wt 79.4 kg   SpO2 95%   BMI 26.61 kg/m  Physical Exam  ED Results / Procedures / Treatments   Labs (all labs ordered are listed, but only abnormal results are displayed) Labs Reviewed  BASIC METABOLIC PANEL - Abnormal; Notable for the following components:      Result Value   Glucose, Bld 185 (*)    All other components within normal limits  CBG MONITORING, ED - Abnormal; Notable for the following components:   Glucose-Capillary 173 (*)    All other components within normal limits  CBC  URINALYSIS, ROUTINE W  REFLEX MICROSCOPIC  TROPONIN I (HIGH SENSITIVITY)    EKG EKG Interpretation  Date/Time:  Tuesday December 15 2021 20:39:38 EDT Ventricular Rate:  80 PR Interval:  218 QRS Duration: 94 QT Interval:  368 QTC Calculation: 424 R Axis:   22 Text Interpretation: Sinus rhythm with 1st degree A-V block with Premature atrial complexes Incomplete right bundle branch block Borderline ECG When compared with ECG of 01-Aug-2012 11:40, Premature atrial complexes are now Present Confirmed by Margaretmary Eddy [phone]) on 12/15/2021 10:06:57 PM  Radiology No results found.  Procedures Procedures  {Document cardiac monitor, telemetry assessment procedure when appropriate:1}  Medications Ordered in ED Medications - No data to display  ED Course/ Medical Decision Making/ A&P                           Medical Decision Making Amount and/or Complexity of Data Reviewed Labs: ordered.   ***  {Document critical care time when appropriate:1} {Document review of labs and clinical decision tools ie heart score, Chads2Vasc2 etc:1}  {Document your independent review of radiology images, and any outside records:1} {Document your discussion with family members, caretakers, and with consultants:1} {Document social determinants of health affecting pt's care:1} {Document your decision making why or why not admission, treatments were needed:1} Final Clinical Impression(s) / ED Diagnoses Final diagnoses:  None    Rx / DC Orders ED Discharge Orders     None

## 2021-12-16 ENCOUNTER — Encounter: Payer: Self-pay | Admitting: Endocrinology

## 2021-12-16 ENCOUNTER — Encounter: Payer: Self-pay | Admitting: Family Medicine

## 2021-12-16 ENCOUNTER — Emergency Department (HOSPITAL_COMMUNITY): Payer: Medicare Other

## 2021-12-16 ENCOUNTER — Other Ambulatory Visit (HOSPITAL_BASED_OUTPATIENT_CLINIC_OR_DEPARTMENT_OTHER): Payer: Self-pay

## 2021-12-16 DIAGNOSIS — R42 Dizziness and giddiness: Secondary | ICD-10-CM | POA: Diagnosis not present

## 2021-12-16 MED ORDER — PREDNISONE 10 MG PO TABS
ORAL_TABLET | ORAL | 0 refills | Status: DC
  Filled 2021-12-16: qty 72, 18d supply, fill #0

## 2021-12-16 MED ORDER — GADOBUTROL 1 MMOL/ML IV SOLN
8.0000 mL | Freq: Once | INTRAVENOUS | Status: AC | PRN
  Administered 2021-12-16: 8 mL via INTRAVENOUS

## 2021-12-16 NOTE — ED Notes (Signed)
Arrives from [facility] ER for MRI, Dr. Alvino Chapel accepting EDP. Currently endorses HA, unchanged from initial presentation today. A&Ox4, some claustrophobia but denies needind medications for MRIs usually.

## 2021-12-16 NOTE — ED Notes (Signed)
Pt ambulated to and from bathroom without assistance. Gait steady, mild dizziness reported

## 2021-12-16 NOTE — ED Provider Notes (Signed)
Transferred from [facility] ER in care of Dr. Philip Aspen for MRI brain to r/o central causes of vertigo.   Physical Exam  BP 135/79 (BP Location: Left Arm)   Pulse 64   Temp 97.7 F (36.5 C) (Oral)   Resp 17   Ht 5\' 8"  (1.727 m)   Wt 79.4 kg   SpO2 97%   BMI 26.61 kg/m   Physical Exam Vitals and nursing note reviewed.  Constitutional:      Appearance: Normal appearance.  HENT:     Head: Normocephalic and atraumatic.     Nose: Nose normal.     Mouth/Throat:     Mouth: Mucous membranes are moist.  Eyes:     Extraocular Movements: Extraocular movements intact.     Conjunctiva/sclera: Conjunctivae normal.     Pupils: Pupils are equal, round, and reactive to light.  Cardiovascular:     Rate and Rhythm: Normal rate and regular rhythm.  Pulmonary:     Effort: Pulmonary effort is normal.     Breath sounds: Normal breath sounds.  Abdominal:     General: Abdomen is flat. Bowel sounds are normal.     Palpations: Abdomen is soft.  Musculoskeletal:        General: Normal range of motion.     Cervical back: Normal range of motion and neck supple.  Skin:    General: Skin is warm and dry.     Capillary Refill: Capillary refill takes less than 2 seconds.  Neurological:     General: No focal deficit present.     Mental Status: He is alert and oriented to person, place, and time.  Psychiatric:        Mood and Affect: Mood normal.        Thought Content: Thought content normal.     Procedures  Procedures  ED Course / MDM   Clinical Course as of 12/16/21 0652  Tue Dec 15, 2021  2231 Spoke with Neuro Dr Lorrin Goodell. Feels that given the chronicity and intermittent symptoms that stroke is unlikely. Feels that ct head would show stroke at this time but would recommend [facility]. Steroids could be given 60mg  7 days then by 10 mg taper q2d. Could also try meclizine and scopolamine if patient has normal MRI.  [RP]  Wed Dec 16, 2021  0228 MR Angiogram Neck W or Wo Contrast [BS]   0403 MR Angiogram Neck W or Wo Contrast [BS]  0403 MR BRAIN WO CONTRAST [BS]    Clinical Course User Index [BS] Madailein Londo, Si Gaul, PA [RP] Fransico Meadow, MD   Medical Decision Making Amount and/or Complexity of Data Reviewed Labs: ordered. Radiology: ordered. Decision-making details documented in ED Course.  Risk Prescription drug management.  Pt requesting MRA. Consulted with neurology, Dr. Lorrin Goodell, he states order MRI brain w/o, MRA head w/o, and MRA neck w/ to evaluate for vertebral basilar insufficiency.   MRI brain and MRA head reviewed. Illustrates chronic left mastoid effusion, no erythema or swelling of ear on physical exam. MRA neck pending--pt requesting DC, advised patient against, he understood risks and chose to leave prior to MRA neck return. Of note, MRI had multiple issues crossing over the images and thus delayed patient's MRA neck results. Ultimately, the MRA study images w/o contrast are being crossed over, but the w/contrast study won't be processed due to technical difficulty per imaging staff. Patient was advised to follow-up with his PCP for his results. Additionally, he was sent prednisone per neurology's recommendations  for the dizziness and referred to ENT.  Patient left prior to results. There were not significant cervical carotid or vertebral artery stenosis. Left vertebral artery is dominant. Non-diagnostic attempted post-contrast neck MRA.   Dx Dizziness  Disposition Discharged home   Osvaldo Shipper, [place] 12/16/21 0701    Quintella Reichert, MD 12/17/21 0009

## 2021-12-16 NOTE — ED Notes (Signed)
RN reviewed discharge instructions with pt. Pt verbalize understanding and had no further questions. VSS upon discharge

## 2021-12-16 NOTE — ED Notes (Signed)
Patient transported to MRI 

## 2021-12-16 NOTE — ED Notes (Signed)
ED Provider at bedside. 

## 2021-12-16 NOTE — ED Notes (Signed)
SBAR Report called to [place] at this time, pt provided with paperwork and going POV to [facility] at this time for MRI. Accepting MD is [place].

## 2021-12-17 ENCOUNTER — Other Ambulatory Visit: Payer: Self-pay

## 2021-12-17 ENCOUNTER — Other Ambulatory Visit (INDEPENDENT_AMBULATORY_CARE_PROVIDER_SITE_OTHER): Payer: Medicare Other

## 2021-12-17 DIAGNOSIS — R739 Hyperglycemia, unspecified: Secondary | ICD-10-CM

## 2021-12-17 NOTE — Telephone Encounter (Signed)
Labs ordered.

## 2021-12-18 ENCOUNTER — Telehealth: Payer: Self-pay | Admitting: Family Medicine

## 2021-12-18 LAB — BASIC METABOLIC PANEL
BUN: 16 mg/dL (ref 6–23)
CO2: 30 mEq/L (ref 19–32)
Calcium: 9.5 mg/dL (ref 8.4–10.5)
Chloride: 99 mEq/L (ref 96–112)
Creatinine, Ser: 1.11 mg/dL (ref 0.40–1.50)
GFR: 66.77 mL/min (ref >60.00)
Glucose, Bld: 111 mg/dL — ABNORMAL HIGH (ref 70–99)
Potassium: 4 mEq/L (ref 3.5–5.1)
Sodium: 138 mEq/L (ref 135–145)

## 2021-12-18 LAB — HEMOGLOBIN A1C: Hgb A1c MFr Bld: 5.6 % (ref 4.6–6.5)

## 2021-12-18 NOTE — Telephone Encounter (Signed)
Patient requests to be called at ph# [phone] to be given lab results

## 2021-12-18 NOTE — Telephone Encounter (Signed)
Lab have not been commented on/resulted by Dr. Yong Channel. I will call pt once resulted.

## 2021-12-21 NOTE — Telephone Encounter (Signed)
Pt has reviewed lab results via mychart already.

## 2021-12-22 ENCOUNTER — Ambulatory Visit (INDEPENDENT_AMBULATORY_CARE_PROVIDER_SITE_OTHER): Payer: Medicare Other | Admitting: Family Medicine

## 2021-12-22 ENCOUNTER — Encounter: Payer: Self-pay | Admitting: Family Medicine

## 2021-12-22 ENCOUNTER — Other Ambulatory Visit (HOSPITAL_BASED_OUTPATIENT_CLINIC_OR_DEPARTMENT_OTHER): Payer: Self-pay

## 2021-12-22 VITALS — BP 124/60 | HR 65 | Temp 97.6°F | Ht 68.0 in | Wt 184.0 lb

## 2021-12-22 DIAGNOSIS — I10 Essential (primary) hypertension: Secondary | ICD-10-CM | POA: Diagnosis not present

## 2021-12-22 DIAGNOSIS — R42 Dizziness and giddiness: Secondary | ICD-10-CM | POA: Diagnosis not present

## 2021-12-22 DIAGNOSIS — E781 Pure hyperglyceridemia: Secondary | ICD-10-CM

## 2021-12-22 MED ORDER — ATORVASTATIN CALCIUM 10 MG PO TABS
10.0000 mg | ORAL_TABLET | Freq: Every day | ORAL | 3 refills | Status: DC
  Filled 2021-12-22: qty 90, 90d supply, fill #0

## 2021-12-22 NOTE — Progress Notes (Signed)
Phone [phone] In person visit   Subjective:   [name] Page is a 71 y.o. year old very pleasant male patient who presents for/with See problem oriented charting Chief Complaint  Patient presents with   Follow-up    Pt is her for ER f/u for dizziness and states he is doing some better but still a lot of stumbling around. Prednisone seems to be helping.    Past Medical History-  Patient Active Problem List   Diagnosis Date Noted   Chronic pain syndrome 08/31/2016    Priority: High   History of lung cancer 12/11/2012    Priority: High   Aortic atherosclerosis ([place]) 11/03/2020    Priority: Medium    History of adenomatous polyp of colon 03/24/2018    Priority: Medium    Generalized anxiety disorder 03/12/2014    Priority: Medium    Essential hypertension 03/12/2014    Priority: Medium    Peripheral neuropathy, idiopathic 12/22/2012    Priority: Medium    Dyspnea 07/14/2012    Priority: Medium    Hypertriglyceridemia 07/14/2012    Priority: Medium    Depression 02/16/2010    Priority: Medium    Low testosterone 11/21/2007    Priority: Medium    Chronic bilateral low back pain 06/05/2019    Priority: Low   Venous stasis of both lower extremities 06/05/2019    Priority: Low   Mastoiditis 03/09/2017    Priority: Low   Palpitations 08/11/2016    Priority: Low   Eustachian tube dysfunction 12/17/2014    Priority: Low   SI (sacroiliac) joint dysfunction 09/19/2013    Priority: Low   Nonspecific abnormal electrocardiogram (ECG) (EKG) 07/14/2012    Priority: Low   Allergic reaction, history of 10/25/2011    Priority: Low   Rectal or anal pain 10/01/2010    Priority: Low   Allergic rhinitis 06/13/2007    Priority: Low   GERD 06/13/2007    Priority: Low   IBS (irritable bowel syndrome) 06/13/2007    Priority: Low   Nonallopathic lesion of sacral region 09/19/2013   Nonallopathic lesion of thoracic region 09/19/2013   Nonallopathic lesion of lumbosacral region  09/19/2013    Medications- reviewed and updated Current Outpatient Medications  Medication Sig Dispense Refill   Acetylcarnitine HCl (ACETYL L-CARNITINE PO) Take by mouth.     Ascorbic Acid (VITAMIN C) 1000 MG tablet      atorvastatin (LIPITOR) 10 MG tablet Take 1 tablet (10 mg total) by mouth daily. 90 tablet 3   b complex vitamins tablet Take 1 tablet by mouth daily.     buPROPion (WELLBUTRIN XL) 300 MG 24 hr tablet TAKE 1 TABLET BY MOUTH  DAILY 90 tablet 3   Cholecalciferol (VITAMIN D) 2000 UNITS CAPS Take 1 capsule by mouth daily.     clonazePAM (KLONOPIN) 0.5 MG tablet Take 1 tablet (0.5 mg total) by mouth 2 (two) times daily as needed (do not drive for 12 hours after taking). for anxiety 180 tablet 1   Coenzyme Q10 (CO Q 10 PO) Take 1 capsule by mouth daily.     diltiazem (CARDIZEM CD) 180 MG 24 hr capsule TAKE 1 CAPSULE BY MOUTH DAILY 100 capsule 2   gabapentin (NEURONTIN) 300 MG capsule TAKE 2 CAPSULES BY MOUTH AT  BEDTIME 200 capsule 2   hydrochlorothiazide (HYDRODIURIL) 25 MG tablet Take 1 tablet (25 mg total) by mouth daily. 90 tablet 3   HYDROcodone-acetaminophen (NORCO/VICODIN) 5-325 MG tablet Take 0.5-1 tablets by mouth 2 (two)  times daily as needed. Do not use indicated instructions more than one to three times per week. 55 tablet 0   methylcellulose (CITRUCEL) oral powder Use as directed once daily     NEEDLE, DISP, 22 G 22G X 1" MISC Used to inject testosterone weekly 50 each 2   potassium chloride (KLOR-CON) 10 MEQ tablet TAKE 3 TABLETS BY MOUTH IN  THE MORNING AND 2 TABLETS  BY MOUTH IN THE AFTERNOON 500 tablet 2   predniSONE (DELTASONE) 10 MG tablet Take 6 tabs daily for 7 days, then 5 tabs daily for 2 days, then 4 tabs daily for 2 days, then 3 tabs daily for 2 days, then 2 tabs daily for 2 days then 1 tab daily for 2 days. 72 tablet 0   Saw Palmetto, Serenoa repens, (SAW PALMETTO PO) Take 1 capsule by mouth 2 (two) times daily.     Syringe, Disposable, 1 ML MISC USE AS  DIRECTED ONCE A WEEK WITH TESTOSTERONE 25 each 3   testosterone cypionate (DEPOTESTOSTERONE CYPIONATE) 200 MG/ML injection INJECT INTRAMUSCULARLY 0.3  ML WEEKLY (DISCARD UNUSED  AFTER FIRST USE) 3 mL 5   Zinc 15 MG CAPS Take by mouth.     No current facility-administered medications for this visit.     Objective:  BP 124/60   Pulse 65   Temp 97.6 F (36.4 C)   Ht $R'5\' 8"'Fs$  (1.727 m)   Wt 184 lb (83.5 kg)   SpO2 96%   BMI 27.98 kg/m  Gen: NAD, resting comfortably CV: RRR no murmurs rubs or gallops Lungs: CTAB no crackles, wheeze, rhonchi Ext: minimal edema Skin: warm, dry Neuro: gait normal today but reports can hit him suddenly with vertigo    Assessment and Plan   # ED follow-up for ataxia and vertigo S:Patient with emergency department visit on 12/15/2021 for complaint of dizziness.  Had a month of intermittent dizziness that seem to be progressing and limiting his ability to walk.  Present both at rest and with head movement.  Longstanding hearing loss but no new tinnitus or hearing loss.  Mild headache for about a week at time of visit.  Reported similar symptoms in the past attributed to mastoiditis.  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  Neurology did recommend trial of prednisone as well as referral to ENT as well as PCP follow-up.  From ED report "Patient left prior to results. There were not significant cervical carotid or vertebral artery stenosis. Left vertebral artery is dominant. Non-diagnostic attempted post-contrast neck MRA." MR angio was considered nondiagnostic though officially.   Today he reports to me 3-4 months of caring for dying cat and had a lot of stress. Several months ago noted increased ataxia and noted more  stumbling- thought could be gabapentin- tried to taper off the gabapentin went down to 1 at bedtime and had some worsening pain- did 2 weeks of 1 total per day and then went off completely- couldn't sleep due to too much pain- later went back on. Thought maybe ataxia slightly better. Felt like strength in legs was worse. Also had vertigo on top of this starting about a month ago. On day going in had some palpitations -we did check a1c and bmp was reassuring  Making progress on symptoms- ataxia  the same, vertigo improving on prednisone. Has felt some energy/motivation on prednisone. Had seen Dr. Ernesto Rutherford in past and Dr. Redmond Baseman was not covered (he wants to hold off on ENT for now- feels eustachian tube doing better) A/P: ataxia stable- seems like gabapentin contributing but doesn't tolerate being off of this and alternates that he has researched are not appealing for various reasons- could consider neurology consult in future -plans on trial of yoga and tai chi  Vertigo improving on prednisone- plans to finish course and wants to hold off on ENT referral unless fails to continue to improve   # Skin cancer screening- wants updated referral but will check with insurance on options we discussed and let me know  #hypertension/hypokalemia requiring potassium 20 mEq twice daily S: medication: Chlorthalidone 25 mg, diltiazem 180 mg extended release (amlodipine caused edema in past).   BP Readings from Last 3 Encounters:  12/22/21 124/60  12/16/21 135/79  08/26/21 122/60  A/P: Controlled. Continue current medications.   #Hypertriglyceridemia/aortic atherosclerosis on the other hand with coronary calcium score of 0 on 07/08/2020 S: Medication:Atorvastatin 40 mg -Had microvascular ischemia on prior MRI Lab Results  Component Value Date   CHOL 116 11/12/2020   HDL 46.50 11/12/2020   LDLCALC 57 11/12/2020   TRIG 60.0 11/12/2020   CHOLHDL 2 11/12/2020   A/P: he is concerned about possible correlation  between statin and neuropathy but on other hand knows risk factors with microvascular ischemia and aortic atherosclerosis- wanted to trial 10 mg atorvastatin and recheck next visit- I thinks that's reasonable and changed dose today  Recommended follow up: Return for next already scheduled visit or sooner if needed. Future Appointments  Date Time Provider [place]  02/11/2022  3:20 PM Marin Olp, MD [facility]  03/04/2022 10:00 AM Elayne Snare, MD [facility] None  07/02/2022 10:00 AM [facility] HEALTH COACH [facility]   Lab/Order associations:   ICD-10-CM   1. Dizziness  R42     2. Essential hypertension  I10     3. Hypertriglyceridemia  E78.1       Meds ordered this encounter  Medications   atorvastatin (LIPITOR) 10 MG tablet    Sig: Take 1 tablet (10 mg total) by mouth daily.    Dispense:  90 tablet    Refill:  3    Return precautions advised.  [name], MD

## 2021-12-22 NOTE — Patient Instructions (Addendum)
Let us know when you get your flu and COVID vaccines.  [facility]- Dr. Ronnald Ramp, dermatology specialists- Dr. Renda Rolls or [facility]- Dr. Pearline Cables -let me know and we can place referral for you  I like your idea of yoga or tai chi and meditation  Recommended follow up: Return for next already scheduled visit or sooner if needed.

## 2022-01-21 ENCOUNTER — Other Ambulatory Visit (HOSPITAL_BASED_OUTPATIENT_CLINIC_OR_DEPARTMENT_OTHER): Payer: Self-pay

## 2022-01-21 MED ORDER — INFLUENZA VAC A&B SA ADJ QUAD 0.5 ML IM PRSY
PREFILLED_SYRINGE | INTRAMUSCULAR | 0 refills | Status: DC
  Filled 2022-01-21: qty 0.5, 1d supply, fill #0

## 2022-02-03 ENCOUNTER — Other Ambulatory Visit: Payer: Self-pay | Admitting: Family Medicine

## 2022-02-03 ENCOUNTER — Encounter: Payer: Self-pay | Admitting: Family Medicine

## 2022-02-04 ENCOUNTER — Other Ambulatory Visit: Payer: Self-pay

## 2022-02-04 DIAGNOSIS — Z1283 Encounter for screening for malignant neoplasm of skin: Secondary | ICD-10-CM

## 2022-02-11 ENCOUNTER — Ambulatory Visit (INDEPENDENT_AMBULATORY_CARE_PROVIDER_SITE_OTHER): Payer: Medicare Other | Admitting: Family Medicine

## 2022-02-11 ENCOUNTER — Encounter: Payer: Self-pay | Admitting: Family Medicine

## 2022-02-11 ENCOUNTER — Other Ambulatory Visit (HOSPITAL_BASED_OUTPATIENT_CLINIC_OR_DEPARTMENT_OTHER): Payer: Self-pay

## 2022-02-11 VITALS — BP 120/70 | HR 71 | Temp 98.1°F | Ht 68.0 in | Wt 181.0 lb

## 2022-02-11 DIAGNOSIS — I10 Essential (primary) hypertension: Secondary | ICD-10-CM

## 2022-02-11 DIAGNOSIS — Z79899 Other long term (current) drug therapy: Secondary | ICD-10-CM

## 2022-02-11 DIAGNOSIS — R7989 Other specified abnormal findings of blood chemistry: Secondary | ICD-10-CM

## 2022-02-11 DIAGNOSIS — R7301 Impaired fasting glucose: Secondary | ICD-10-CM

## 2022-02-11 DIAGNOSIS — E559 Vitamin D deficiency, unspecified: Secondary | ICD-10-CM | POA: Diagnosis not present

## 2022-02-11 DIAGNOSIS — Z0000 Encounter for general adult medical examination without abnormal findings: Secondary | ICD-10-CM

## 2022-02-11 MED ORDER — HYDROCODONE-ACETAMINOPHEN 5-325 MG PO TABS
0.5000 | ORAL_TABLET | Freq: Two times a day (BID) | ORAL | 0 refills | Status: DC | PRN
  Filled 2022-02-11: qty 55, 28d supply, fill #0

## 2022-02-11 NOTE — Patient Instructions (Addendum)
Thanks for doing labs If you have mychart- we will send your results within 3 business days of [place] receiving them.  If you do not have mychart- we will call you about results within 5 business days of [place] receiving them.  *please also note that you will see labs on mychart as soon as they post. I will later go in and write notes on them- will say "notes from Dr. Yong Channel"   Glad you are doing reasonably well if we could just get rid of your neuropathy!   Recommended follow up: Return in about 4 months (around 06/12/2022) for followup or sooner if needed.Schedule b4 you leave.

## 2022-02-11 NOTE — Progress Notes (Signed)
Phone: [phone]   Subjective:  Patient presents today for their annual physical. Chief complaint-noted.   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   Cataract    forming   Depressive disorder, not elsewhere classified    Diverticulitis    X 1 EPISODE   Esophageal reflux    past hx    HTN (hypertension)    Hyperlipidemia    CONTROLLED   Lymphocytic colitis    Neuromuscular disorder ([facility])    neuropathy   Neuropathy    Other testicular hypofunction    Palpitations    Personal history of urinary calculi    LAST IN THE 80'S   Patient Active Problem List   Diagnosis Date Noted   Chronic pain syndrome 08/31/2016    Priority: High   History of lung cancer 12/11/2012    Priority: High   Vitamin D deficiency 02/11/2022    Priority: Medium    Aortic atherosclerosis ([place]) 11/03/2020    Priority: Medium    History of adenomatous polyp of colon 03/24/2018    Priority: Medium    Generalized anxiety disorder 03/12/2014    Priority: Medium    Essential hypertension 03/12/2014    Priority: Medium    Peripheral neuropathy, idiopathic 12/22/2012    Priority: Medium    Dyspnea 07/14/2012    Priority: Medium    Hypertriglyceridemia 07/14/2012    Priority: Medium    Depression 02/16/2010    Priority: Medium    Low testosterone 11/21/2007    Priority: Medium    Chronic bilateral low back pain 06/05/2019     Priority: Low   Venous stasis of both lower extremities 06/05/2019    Priority: Low   Mastoiditis 03/09/2017    Priority: Low   Palpitations 08/11/2016    Priority: Low   Eustachian tube dysfunction 12/17/2014    Priority: Low   SI (sacroiliac) joint dysfunction 09/19/2013    Priority: Low   Nonspecific abnormal electrocardiogram (ECG) (EKG) 07/14/2012    Priority: Low   Allergic reaction, history of 10/25/2011    Priority: Low   Rectal or anal pain 10/01/2010    Priority: Low   Allergic rhinitis 06/13/2007    Priority: Low   GERD 06/13/2007    Priority: Low   IBS (irritable bowel syndrome) 06/13/2007    Priority: Low   Nonallopathic lesion of sacral region 09/19/2013   Nonallopathic lesion of thoracic region 09/19/2013   Nonallopathic lesion of lumbosacral region 09/19/2013   Past Surgical History:  Procedure Laterality Date   COLONOSCOPY     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   [name]   parotid tumor surgery     [place]- BENIGN   POLYPECTOMY     TONSILLECTOMY AND ADENOIDECTOMY      Family History  Problem Relation Age of Onset   Hypertension Mother    Anxiety disorder Mother  Non-Hodgkin's lymphoma Mother    Dementia Father    Depression Father        and anxiety   Hypertension Father    Sudden death Paternal Uncle 51   Colon cancer Paternal Uncle    Hypertension Brother    Early death Neg Hx    Heart disease Neg Hx        mother in 6s had lesoin   Colon polyps Neg Hx    Esophageal cancer Neg Hx    Rectal cancer Neg Hx    Stomach cancer Neg Hx     Medications- reviewed and updated Current Outpatient Medications  Medication Sig Dispense Refill   Acetylcarnitine HCl (ACETYL L-CARNITINE PO) Take by mouth.     Ascorbic Acid (VITAMIN C) 1000 MG tablet      atorvastatin (LIPITOR) 10 MG tablet Take 1 tablet (10 mg total) by mouth daily. 90 tablet 3   b complex vitamins tablet Take 1 tablet by mouth daily.      buPROPion (WELLBUTRIN XL) 300 MG 24 hr tablet TAKE 1 TABLET BY MOUTH DAILY 90 tablet 3   Cholecalciferol (VITAMIN D) 2000 UNITS CAPS Take 1 capsule by mouth daily.     Coenzyme Q10 (CO Q 10 PO) Take 1 capsule by mouth daily.     diltiazem (CARDIZEM CD) 180 MG 24 hr capsule TAKE 1 CAPSULE BY MOUTH DAILY 100 capsule 2   gabapentin (NEURONTIN) 300 MG capsule TAKE 2 CAPSULES BY MOUTH AT  BEDTIME 200 capsule 2   hydrochlorothiazide (HYDRODIURIL) 25 MG tablet Take 1 tablet (25 mg total) by mouth daily. 90 tablet 3   methylcellulose (CITRUCEL) oral powder Use as directed once daily     NEEDLE, DISP, 22 G 22G X 1" MISC Used to inject testosterone weekly 50 each 2   potassium chloride (KLOR-CON) 10 MEQ tablet TAKE 3 TABLETS BY MOUTH IN  THE MORNING AND 2 TABLETS  BY MOUTH IN THE AFTERNOON 500 tablet 2   Saw Palmetto, Serenoa repens, (SAW PALMETTO PO) Take 1 capsule by mouth 2 (two) times daily.     Syringe, Disposable, 1 ML MISC USE AS DIRECTED ONCE A WEEK WITH TESTOSTERONE 25 each 3   testosterone cypionate (DEPOTESTOSTERONE CYPIONATE) 200 MG/ML injection INJECT INTRAMUSCULARLY 0.3  ML WEEKLY (DISCARD UNUSED  AFTER FIRST USE) 3 mL 5   Zinc 15 MG CAPS Take by mouth.     clonazePAM (KLONOPIN) 0.5 MG tablet Take 1 tablet (0.5 mg total) by mouth 2 (two) times daily as needed (do not drive for 12 hours after taking). for anxiety (Patient not taking: Reported on 02/11/2022) 180 tablet 1   HYDROcodone-acetaminophen (NORCO/VICODIN) 5-325 MG tablet Take 0.5-1 tablets by mouth 2 (two) times daily as needed. Do not use indicated instructions more than one to three times per week. 55 tablet 0   predniSONE (DELTASONE) 10 MG tablet Take 6 tabs daily for 7 days, then 5 tabs daily for 2 days, then 4 tabs daily for 2 days, then 3 tabs daily for 2 days, then 2 tabs daily for 2 days then 1 tab daily for 2 days. 72 tablet 0   No current facility-administered medications for this visit.    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 Married 30 + years. No children, lots of critters.       Cancer Survivor- carcinoid lung cancer.   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 Martin Majestic back to work for ability to be insured.       Hobbies: music, home repair/improvement, cats   Objective  Objective:  BP 120/70   Pulse 71   Temp 98.1 F (36.7 C)   Ht _0  (1.727 m)   Wt 181 lb (82.1 kg)   SpO2 96%   BMI 27.52 kg/m  Gen: NAD, resting comfortably HEENT: Mucous membranes are moist. Oropharynx normal Neck: no thyromegaly CV: RRR no murmurs rubs or gallops Lungs: CTAB no crackles, wheeze, rhonchi Abdomen: soft/nontender/nondistended/normal bowel sounds. No rebound or guarding.  Ext: no edema Skin: warm, dry Neuro: grossly normal, moves all extremities, PERRLA Rectal: normal tone, normal sized prostate, no masses or tenderness    Assessment and Plan  71 y.o. male presenting for annual physical.  Health Maintenance counseling: 1. Anticipatory guidance: Patient counseled regarding regular dental exams - generally q6 months- will get back in during january, eye exams -yearly,  avoiding smoking and second hand smoke , limiting alcohol to 2 beverages per day - 1 beer every 2-3 months, no illicit drugs .   2. Risk factor reduction:  Advised patient of need for regular exercise and diet rich and fruits and vegetables to reduce risk of heart attack and stroke.  Exercise- getting about 8k steps a day- remaining very active even when doesn't do treadmill but tries to do treadmill.  Diet/weight management-weight up 1 lb  from last year- trying to eat healthy but does have sweet tooth- thinks could curb some.  Wt Readings from Last 3 Encounters:  02/11/22 181 lb (82.1 kg)  12/22/21 184 lb (83.5 kg)  12/15/21 175 lb (79.4 kg)  3. Immunizations/screenings/ancillary studies-covid vaccination-considering at pharmacy , had flu shot , prevnar 20 option - plans to do later date most likely , RSV- undecided  Immunization History  Administered Date(s) Administered   Fluad Quad(high Dose 65+) 01/16/2019, 01/28/2020, 01/25/2021   Influenza Whole 01/04/2012   Influenza, High Dose Seasonal PF 12/21/2017, 01/21/2022   Influenza,inj,Quad PF,6+ Mos 12/17/2014   Influenza-Unspecified 01/17/2014, 12/28/2015, 01/01/2017   PFIZER Comirnaty(Gray Top)Covid-19 Tri-Sucrose Vaccine 08/18/2020   PFIZER(Purple Top)SARS-COV-2 Vaccination 04/19/2019, 05/14/2019, 12/31/2019, 12/25/2020   Pfizer Covid-19 Vaccine Bivalent Booster 52yr & up 12/23/2020   Pneumococcal Conjugate-13 06/17/2015   Pneumococcal Polysaccharide-23 09/07/2016   Td 02/27/2001, 04/10/2011   Tdap 05/28/2021   Zoster Recombinat (Shingrix) 04/21/2021, 08/26/2021   Zoster, Live 08/24/2010  4. Prostate cancer screening-  low risk prior psa trend, past age based screening recommendations but still on testosterone Lab Results  Component Value Date   PSA 0.26 11/12/2020   PSA 0.26 02/14/2020   PSA 0.22 02/05/2019   5. Colon cancer screening - 02/23/21 and "graduated" per result note 02/23/21 on polyp 6. Skin cancer screening- sees dermatology yearly in general-now [facility]- sees nov 17th . advised regular sunscreen use. Denies worrisome, changing, or new skin lesions- 1 spot on back he will have them look at  7. Smoking associated screening (lung cancer screening, AAA screen 65-75, UA)-never smoker- see below about lung cancer  8. STD screening - opts out as monogamous  Status of chronic or acute concerns   #  History of lung cancer/actually carcinoid  tumor malignant-we follow an annual chest x-ray on him.  Last chest x-ray 05/01/2021.  Repeat next year in 2024.   #Chronic pain syndrome/neuropathy S: Medication: Gabapentin 600 mg at bedtime (concerned contributes to unsteadiness particularly at night), sparing hydrocodone twice a week.  He knows to space hydrocodone and clonazepam at least 12 hours  -Does stationary bike for exercise as far to be on his feet for prolonged periods- wants to get back on this  A/P: chronic pain from neuropathy overall stable- continue current meds- though over time hydrocodone has increased some  -PDMP reviewed and low risk trend  -UDS last updated 08/26/21 -Controlled substance contract on file from4/08/2017  #hypertension/hypokalemia requiring potassium 20 mEq twice daily S: medication: hctz 25 mg, diltiazem 180 mg extended release (amlodipine caused edema in past).  Some elevations in the past and have considered increasing diltiazem to 240 mg  Home readings #s: 120s-130s/70s BP Readings from Last 3 Encounters:  02/11/22 120/70  12/22/21 124/60  12/16/21 135/79  A/P: Controlled. Continue current medications.   #Hypertriglyceridemia/aortic atherosclerosis on the other hand with coronary calcium score of 0 on 07/08/2020 S: Medication:Atorvastatin 40 mg--> 87m (patient concern about neuropathy)  Had microvascular ischemia on prior MRI#Hypertriglyceridemia Lab Results  Component Value Date   CHOL 116 11/12/2020   HDL 46.50 11/12/2020   LDLCALC 57 11/12/2020   TRIG 60.0 11/12/2020   CHOLHDL 2 11/12/2020   A/P: update lipids- hoping LDL under 70 if possible but likely tolerate at least under 100   # Depression/GAD S: Medication: Wellbutrin 300 mg .  Uses clonazepam 1-2 times a day-mainly taking at night lately Has not tolerated SSRIs due to palpitations    02/11/2022    4:50 PM 12/22/2021    8:22 AM 08/26/2021   10:14 AM  Depression screen PHQ 2/9  Decreased Interest 0 0 1  Down, Depressed, Hopeless 0  2 1  PHQ - 2 Score 0 2 2  Altered sleeping _0 Tired, decreased energy 0 3 1  Change in appetite 0 0 0  Feeling bad or failure about yourself  0 0 1  Trouble concentrating _1 Moving slowly or fidgety/restless 0 0 0  Suicidal thoughts 0 0 0  PHQ-9 Score _2 Difficult doing work/chores Not difficult at all Somewhat difficult Somewhat difficult   A/P: doing well lately staying busy- continue current meds  #Hypogonadism-on testosterone treatment with Dr. KDwyane Dee - needs PSA and testosterone -had shot on Monday- Thursday today  #Vertigo/unsteadiness- prior ED visit- see last note. Doing some treadmill which helps some but still  wants to try yoga/tai chi. More issues with uneven ground. Not at point he is ready for assistive device. No issues with foot pedals.  #Vitamin D deficiency S: Medication: not taking in a few months Last vitamin D Lab Results  Component Value Date   VD25OH 65.22 11/12/2020   A/P: hopefully stable- update vitamin D- consider restarting 1000 units  #a1c reordered- requested by Dr. KDwyane Dee he knows may be charged 35 for this as had within 3 months   Recommended follow up: Return in about 4 months (around 06/12/2022) for followup or sooner if needed.Schedule b4 you leave. Future Appointments  Date Time Provider [place] 03/04/2022 10:00 AM KElayne Snare MD [facility] None  06/16/2022  9:20 AM HMarin Olp MD [facility]  07/02/2022 10:00 AM [facility] HEALTH COACH [facility]   Lab/Order associations:NOT fasting  ICD-10-CM   1. Preventative health care  Z00.00     2. Essential hypertension  I10 CBC with Differential/Platelet    Comprehensive metabolic panel    Lipid panel    3. Vitamin D deficiency  E55.9 VITAMIN D 25 Hydroxy (Vit-D Deficiency, Fractures)    4. Low testosterone  R79.89 Testosterone    PSA    5. High risk medication use  Z79.899 PSA    6. Impaired fasting glucose  R73.01 HgB A1c      Meds ordered this  encounter  Medications   HYDROcodone-acetaminophen (NORCO/VICODIN) 5-325 MG tablet    Sig: Take 0.5-1 tablets by mouth 2 (two) times daily as needed. Do not use indicated instructions more than one to three times per week.    Dispense:  55 tablet    Refill:  0    Return precautions advised.  [name], MD

## 2022-02-12 ENCOUNTER — Other Ambulatory Visit: Payer: Self-pay | Admitting: Family Medicine

## 2022-02-12 ENCOUNTER — Other Ambulatory Visit (HOSPITAL_BASED_OUTPATIENT_CLINIC_OR_DEPARTMENT_OTHER): Payer: Self-pay

## 2022-02-12 LAB — COMPREHENSIVE METABOLIC PANEL
ALT: 22 U/L (ref 0–53)
AST: 22 U/L (ref 0–37)
Albumin: 4.5 g/dL (ref 3.5–5.2)
Alkaline Phosphatase: 76 U/L (ref 39–117)
BUN: 12 mg/dL (ref 6–23)
CO2: 33 mEq/L — ABNORMAL HIGH (ref 19–32)
Calcium: 9.6 mg/dL (ref 8.4–10.5)
Chloride: 100 mEq/L (ref 96–112)
Creatinine, Ser: 1.01 mg/dL (ref 0.40–1.50)
GFR: 74.7 mL/min (ref >60.00)
Glucose, Bld: 98 mg/dL (ref 70–99)
Potassium: 3.6 mEq/L (ref 3.5–5.1)
Sodium: 139 mEq/L (ref 135–145)
Total Bilirubin: 0.9 mg/dL (ref 0.2–1.2)
Total Protein: 6.6 g/dL (ref 6.0–8.3)

## 2022-02-12 LAB — CBC WITH DIFFERENTIAL/PLATELET
Basophils Absolute: 0.1 10*3/uL (ref 0.0–0.1)
Basophils Relative: 0.9 % (ref 0.0–3.0)
Eosinophils Absolute: 0.2 10*3/uL (ref 0.0–0.7)
Eosinophils Relative: 2.6 % (ref 0.0–5.0)
HCT: 47 % (ref 39.0–52.0)
Hemoglobin: 16.3 g/dL (ref 13.0–17.0)
Lymphocytes Relative: 28.2 % (ref 12.0–46.0)
Lymphs Abs: 2.2 10*3/uL (ref 0.7–4.0)
MCHC: 34.7 g/dL (ref 30.0–36.0)
MCV: 95.1 fl (ref 78.0–100.0)
Monocytes Absolute: 1 10*3/uL (ref 0.1–1.0)
Monocytes Relative: 12.5 % — ABNORMAL HIGH (ref 3.0–12.0)
Neutro Abs: 4.4 10*3/uL (ref 1.4–7.7)
Neutrophils Relative %: 55.8 % (ref 43.0–77.0)
Platelets: 212 10*3/uL (ref 150.0–400.0)
RBC: 4.95 Mil/uL (ref 4.22–5.81)
RDW: 12.4 % (ref 11.5–15.5)
WBC: 7.9 10*3/uL (ref 4.0–10.5)

## 2022-02-12 LAB — LIPID PANEL
Cholesterol: 132 mg/dL (ref 0–200)
HDL: 50.9 mg/dL (ref >39.00)
LDL Cholesterol: 58 mg/dL (ref 0–99)
NonHDL: 81.15
Total CHOL/HDL Ratio: 3
Triglycerides: 116 mg/dL (ref 0.0–149.0)
VLDL: 23.2 mg/dL (ref 0.0–40.0)

## 2022-02-12 LAB — PSA: PSA: 0.3 ng/mL (ref 0.10–4.00)

## 2022-02-12 LAB — HEMOGLOBIN A1C: Hgb A1c MFr Bld: 5.9 % (ref 4.6–6.5)

## 2022-02-12 LAB — TESTOSTERONE: Testosterone: 575.02 ng/dL (ref 300.00–890.00)

## 2022-02-12 LAB — VITAMIN D 25 HYDROXY (VIT D DEFICIENCY, FRACTURES): VITD: 34.52 ng/mL (ref 30.00–100.00)

## 2022-02-15 ENCOUNTER — Other Ambulatory Visit (HOSPITAL_BASED_OUTPATIENT_CLINIC_OR_DEPARTMENT_OTHER): Payer: Self-pay

## 2022-02-15 MED ORDER — HYDROCHLOROTHIAZIDE 25 MG PO TABS
25.0000 mg | ORAL_TABLET | Freq: Every day | ORAL | 3 refills | Status: DC
  Filled 2022-02-15: qty 90, 90d supply, fill #0
  Filled 2022-03-19 – 2022-05-11 (×2): qty 90, 90d supply, fill #1
  Filled 2022-08-04: qty 90, 90d supply, fill #2
  Filled 2022-11-11: qty 90, 90d supply, fill #3

## 2022-02-16 DIAGNOSIS — H25013 Cortical age-related cataract, bilateral: Secondary | ICD-10-CM | POA: Diagnosis not present

## 2022-02-16 DIAGNOSIS — H25043 Posterior subcapsular polar age-related cataract, bilateral: Secondary | ICD-10-CM | POA: Diagnosis not present

## 2022-02-16 DIAGNOSIS — H524 Presbyopia: Secondary | ICD-10-CM | POA: Diagnosis not present

## 2022-02-16 DIAGNOSIS — H2513 Age-related nuclear cataract, bilateral: Secondary | ICD-10-CM | POA: Diagnosis not present

## 2022-02-16 DIAGNOSIS — H43813 Vitreous degeneration, bilateral: Secondary | ICD-10-CM | POA: Diagnosis not present

## 2022-02-19 ENCOUNTER — Other Ambulatory Visit (HOSPITAL_BASED_OUTPATIENT_CLINIC_OR_DEPARTMENT_OTHER): Payer: Self-pay

## 2022-02-19 DIAGNOSIS — L817 Pigmented purpuric dermatosis: Secondary | ICD-10-CM | POA: Diagnosis not present

## 2022-02-19 DIAGNOSIS — L738 Other specified follicular disorders: Secondary | ICD-10-CM | POA: Diagnosis not present

## 2022-02-19 DIAGNOSIS — L565 Disseminated superficial actinic porokeratosis (DSAP): Secondary | ICD-10-CM | POA: Diagnosis not present

## 2022-02-19 DIAGNOSIS — L578 Other skin changes due to chronic exposure to nonionizing radiation: Secondary | ICD-10-CM | POA: Diagnosis not present

## 2022-02-19 DIAGNOSIS — D229 Melanocytic nevi, unspecified: Secondary | ICD-10-CM | POA: Diagnosis not present

## 2022-02-19 DIAGNOSIS — D1801 Hemangioma of skin and subcutaneous tissue: Secondary | ICD-10-CM | POA: Diagnosis not present

## 2022-02-19 DIAGNOSIS — L309 Dermatitis, unspecified: Secondary | ICD-10-CM | POA: Diagnosis not present

## 2022-02-19 DIAGNOSIS — D485 Neoplasm of uncertain behavior of skin: Secondary | ICD-10-CM | POA: Diagnosis not present

## 2022-02-19 DIAGNOSIS — L821 Other seborrheic keratosis: Secondary | ICD-10-CM | POA: Diagnosis not present

## 2022-02-19 DIAGNOSIS — L814 Other melanin hyperpigmentation: Secondary | ICD-10-CM | POA: Diagnosis not present

## 2022-02-19 DIAGNOSIS — L570 Actinic keratosis: Secondary | ICD-10-CM | POA: Diagnosis not present

## 2022-02-19 MED ORDER — CLOBETASOL PROPIONATE 0.05 % EX SOLN
1.0000 | Freq: Every day | CUTANEOUS | 0 refills | Status: DC
  Filled 2022-02-19: qty 25, 30d supply, fill #0

## 2022-02-20 ENCOUNTER — Other Ambulatory Visit (HOSPITAL_BASED_OUTPATIENT_CLINIC_OR_DEPARTMENT_OTHER): Payer: Self-pay

## 2022-03-04 ENCOUNTER — Telehealth: Payer: Medicare Other | Admitting: Endocrinology

## 2022-03-04 DIAGNOSIS — R7301 Impaired fasting glucose: Secondary | ICD-10-CM

## 2022-03-04 DIAGNOSIS — E291 Testicular hypofunction: Secondary | ICD-10-CM

## 2022-03-04 NOTE — Progress Notes (Signed)
Patient ID: [name] Page, male   DOB: [birth year], 71 y.o.   MRN: [id number]             Chief complaint: Endocrinology follow-up  I connected with the above-named patient by video enabled telemedicine application and verified that I am speaking with the correct person. The patient was explained the limitations of evaluation and management by telemedicine and the availability of in person appointments.  Patient also understood that there may be a patient responsible charge related to this service  Location of the patient: Patient's home  Location of the provider: Physician office Only the patient and myself were participating in the encounter The patient understood the above statements and agreed to proceed.   History of Present Illness   HYPOGONADISM:  Hypogonadism was diagnosed in ?  2012  He at that time had complaints of fatigue, some increased depression, decreased libido although he does not remember symptoms well There is no history of the following: Hot flushes, sweats, breast enlargement, long term anabolic steroid use, history of testicular injury, head injury or mumps in childhood.  No history of osteopenia or low impact fracture  At that time an afternoon testosterone level was 214 and free testosterone level low at 32, normal >47         He was also evaluated by a urologist and details are not available He was treated with testosterone gel but he thinks this did not improve his testosterone levels and likely did not continue treatment  Subsequently he was again evaluated in 2015 and with a low testosterone of 239 he was started on testosterone injections With this he thinks she had some improvement in his fatigue and depression His dose had been adjusted from his initial starting dose of 100 mg every 2 weeks and he has been on various regimens including every 3 or 4 weeks injections His testosterone level had been quite variable since about 2016  RECENT  history:  He has been taking 30 mg of testosterone weekly  Because of his increased hematocrit on his initial consultation he was changed to testosterone injections every week instead of every other week  He has been consistent with his weekly injection, recently on Wednesdays However since he was having his labs done on Wednesday with his PCP he took his injection 2 days early  He does not think he has any unusual fatigue, does get tired at times He has gained some weight Testosterone level is now 575  Testosterone level was checked about 2 days after his injection  Hemoglobin is slightly lower  His lab results show testosterone levels as follows:   Lab Results  Component Value Date   TESTOSTERONE 575.02 02/11/2022   TESTOSTERONE 399.85 08/17/2021   TESTOSTERONE 760.38 03/27/2021   TESTOSTERONE 338.73 02/09/2021   Lab Results  Component Value Date   HGB 16.3 02/11/2022    Prolactin level: Normal   Lab Results  Component Value Date   LH <0.20 (L) 04/07/2017   Lab Results  Component Value Date   HGB 16.3 02/11/2022   HGB 17.0 12/15/2021   HGB 16.4 08/17/2021   HGB 16.5 03/27/2021              Allergies as of 03/04/2022       Reactions   Bee Venom Anaphylaxis   Flagyl [metronidazole Hcl] Other (See Comments)   Neuropathy    Nsaids    REACTION: intolereance- ABD PAIN AND DIARRHEA    Protonix [pantoprazole]  Abdominal Pain and Diarrhea        Medication List        Accurate as of March 04, 2022  9:59 AM. If you have any questions, ask your nurse or doctor.          ACETYL L-CARNITINE PO Take by mouth.   atorvastatin 10 MG tablet Commonly known as: LIPITOR Take 1 tablet (10 mg total) by mouth daily.   b complex vitamins tablet Take 1 tablet by mouth daily.   buPROPion 300 MG 24 hr tablet Commonly known as: WELLBUTRIN XL TAKE 1 TABLET BY MOUTH DAILY   Citrucel oral powder Generic drug: methylcellulose Use as directed once daily    clobetasol 0.05 % external solution Commonly known as: TEMOVATE Apply 1 application Externally once a day for 30 days.   clonazePAM 0.5 MG tablet Commonly known as: KLONOPIN Take 1 tablet (0.5 mg total) by mouth 2 (two) times daily as needed (do not drive for 12 hours after taking). for anxiety   CO Q 10 PO Take 1 capsule by mouth daily.   diltiazem 180 MG 24 hr capsule Commonly known as: CARDIZEM CD TAKE 1 CAPSULE BY MOUTH DAILY   Easy Touch Hypodermic Needle 22G X 1" Misc Generic drug: NEEDLE (DISP) 22 G Used to inject testosterone weekly   gabapentin 300 MG capsule Commonly known as: NEURONTIN TAKE 2 CAPSULES BY MOUTH AT  BEDTIME   hydrochlorothiazide 25 MG tablet Commonly known as: HYDRODIURIL Take 1 tablet (25 mg total) by mouth daily.   HYDROcodone-acetaminophen 5-325 MG tablet Commonly known as: NORCO/VICODIN Take 0.5-1 tablets by mouth 2 (two) times daily as needed.; do not exceed more than one to three times per week. (Take 0.5-1 tablets by mouth 2 (two) times daily as needed. Do not use indicated instructions more than one to three times per week.)   potassium chloride 10 MEQ tablet Commonly known as: KLOR-CON TAKE 3 TABLETS BY MOUTH IN  THE MORNING AND 2 TABLETS  BY MOUTH IN THE AFTERNOON   SAW PALMETTO PO Take 1 capsule by mouth 2 (two) times daily.   Syringe (Disposable) 1 ML Misc USE AS DIRECTED ONCE A WEEK WITH TESTOSTERONE   testosterone cypionate 200 MG/ML injection Commonly known as: DEPOTESTOSTERONE CYPIONATE INJECT INTRAMUSCULARLY 0.3  ML WEEKLY (DISCARD UNUSED  AFTER FIRST USE)   vitamin C 1000 MG tablet   Vitamin D 50 MCG (2000 UT) Caps Take 1 capsule by mouth daily.   Zinc 15 MG Caps Take by mouth.        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   Cataract    forming   Depressive disorder, not elsewhere classified    Diverticulitis    X 1 EPISODE   Esophageal reflux    past hx    HTN (hypertension)    Hyperlipidemia    CONTROLLED   Lymphocytic colitis    Neuromuscular disorder (  HCC)    neuropathy   Neuropathy    Other testicular hypofunction    Palpitations    Personal history of urinary calculi    LAST IN THE 80'S    Past Surgical History:  Procedure Laterality Date   COLONOSCOPY     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   [name]   parotid tumor surgery     [place]- [facility]   POLYPECTOMY     TONSILLECTOMY AND ADENOIDECTOMY      Family History  Problem Relation Age of Onset   Hypertension Mother    Anxiety disorder Mother    Non-Hodgkin's lymphoma Mother    Dementia Father    Depression Father        and anxiety   Hypertension Father    Sudden death Paternal Uncle 65   Colon cancer Paternal Uncle    Hypertension Brother    Early death Neg Hx    Heart disease Neg Hx        mother in 52s had lesoin   Colon polyps Neg Hx    Esophageal cancer Neg Hx    Rectal cancer Neg Hx    Stomach cancer Neg Hx     Social History:  reports that he has never smoked. He has never used smokeless tobacco. He reports current alcohol use. He reports that he does not use drugs.  Review of Systems  IMPAIRED fasting glucose:   Although his A1c is normal usually his fasting glucose may be occasionally high  Fasting glucose on his glucose tolerance test done on 03/31/18 was 105 but 2-hour reading was only 103  He is concerned that he has had some higher blood sugars in September but the highest reading was when he was acutely  ill in the emergency room Recently nonfasting glucose was 98 A1c higher but he did get some steroids for his vertigo fairly recently Also has gained weight from lack of exercise due to vertigo  Wt Readings from Last 3 Encounters:  02/11/22 181 lb (82.1 kg)  12/22/21 184 lb (83.5 kg)  12/15/21 175 lb (79.4 kg)   Lab Results  Component Value Date   GLUCOSE 98 02/11/2022   GLUCOSE 111 (H) 12/17/2021   GLUCOSE 185 (H) 12/15/2021   Lab Results  Component Value Date   HGBA1C 5.9 02/11/2022   HGBA1C 5.6 12/17/2021   HGBA1C 5.1 08/26/2021   Lab Results  Component Value Date   LDLCALC 58 02/11/2022   CREATININE 1.01 02/11/2022    General Examination:   There were no vitals taken for this visit.     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  He is on treatment with Depo-Testosterone once a week, continues to be on a stable dose of taking 30 mg weekly using 0.15 mL of the 200 mg/mL regimen  Subjectively has been doing well  Testosterone level is normal and his hematocrit is also okay with the current dose   IMPAIRED fasting glucose with slightly higher A1c in the prediabetic range However he has had some weight gain, issues with vertigo and taking steroids but recently glucose 98 randomly He can also do a little better with cut back back on simple sugars  LIPIDS: Normal and reassured him that in his case he would have more benefits than adverse effects on glucose with 10 mg Lipitor  Plan: Continue 30 mg testosterone injections weekly using the 200 mg/mL preparation  He does  not need to adjust his injection day based on his lab appointment  Restart exercise when able to Check fasting blood sugar on the next visit    Follow-up in 6 months  Noami Bove 03/04/2022, 9:59 AM     Note: This office note was prepared with Dragon voice recognition system technology. Any transcriptional errors that result from this  process are unintentional.

## 2022-03-19 ENCOUNTER — Other Ambulatory Visit (HOSPITAL_BASED_OUTPATIENT_CLINIC_OR_DEPARTMENT_OTHER): Payer: Self-pay

## 2022-03-19 ENCOUNTER — Other Ambulatory Visit: Payer: Self-pay | Admitting: Gastroenterology

## 2022-03-20 ENCOUNTER — Other Ambulatory Visit (HOSPITAL_BASED_OUTPATIENT_CLINIC_OR_DEPARTMENT_OTHER): Payer: Self-pay

## 2022-03-23 ENCOUNTER — Other Ambulatory Visit (HOSPITAL_BASED_OUTPATIENT_CLINIC_OR_DEPARTMENT_OTHER): Payer: Self-pay

## 2022-03-23 MED ORDER — COMIRNATY 30 MCG/0.3ML IM SUSY
PREFILLED_SYRINGE | INTRAMUSCULAR | 0 refills | Status: DC
  Filled 2022-03-23: qty 0.3, 1d supply, fill #0

## 2022-04-13 ENCOUNTER — Encounter (HOSPITAL_BASED_OUTPATIENT_CLINIC_OR_DEPARTMENT_OTHER): Payer: Self-pay | Admitting: Emergency Medicine

## 2022-04-13 ENCOUNTER — Emergency Department (HOSPITAL_BASED_OUTPATIENT_CLINIC_OR_DEPARTMENT_OTHER): Payer: Medicare Other

## 2022-04-13 ENCOUNTER — Other Ambulatory Visit: Payer: Self-pay

## 2022-04-13 ENCOUNTER — Emergency Department (HOSPITAL_BASED_OUTPATIENT_CLINIC_OR_DEPARTMENT_OTHER)
Admission: EM | Admit: 2022-04-13 | Discharge: 2022-04-14 | Disposition: A | Discharge status: Home / Self Care | Payer: Medicare Other | Attending: Emergency Medicine | Admitting: Emergency Medicine

## 2022-04-13 DIAGNOSIS — Z79899 Other long term (current) drug therapy: Secondary | ICD-10-CM | POA: Insufficient documentation

## 2022-04-13 DIAGNOSIS — Z1152 Encounter for screening for COVID-19: Secondary | ICD-10-CM | POA: Diagnosis not present

## 2022-04-13 DIAGNOSIS — I10 Essential (primary) hypertension: Secondary | ICD-10-CM | POA: Insufficient documentation

## 2022-04-13 DIAGNOSIS — H538 Other visual disturbances: Secondary | ICD-10-CM | POA: Insufficient documentation

## 2022-04-13 DIAGNOSIS — Z8249 Family history of ischemic heart disease and other diseases of the circulatory system: Secondary | ICD-10-CM | POA: Insufficient documentation

## 2022-04-13 DIAGNOSIS — E785 Hyperlipidemia, unspecified: Secondary | ICD-10-CM | POA: Diagnosis not present

## 2022-04-13 DIAGNOSIS — H5462 Unqualified visual loss, left eye, normal vision right eye: Secondary | ICD-10-CM | POA: Diagnosis not present

## 2022-04-13 DIAGNOSIS — G629 Polyneuropathy, unspecified: Secondary | ICD-10-CM | POA: Diagnosis not present

## 2022-04-13 DIAGNOSIS — H539 Unspecified visual disturbance: Secondary | ICD-10-CM | POA: Diagnosis not present

## 2022-04-13 DIAGNOSIS — I614 Nontraumatic intracerebral hemorrhage in cerebellum: Secondary | ICD-10-CM | POA: Diagnosis not present

## 2022-04-13 DIAGNOSIS — H7492 Unspecified disorder of left middle ear and mastoid: Secondary | ICD-10-CM

## 2022-04-13 DIAGNOSIS — I639 Cerebral infarction, unspecified: Secondary | ICD-10-CM | POA: Diagnosis not present

## 2022-04-13 DIAGNOSIS — H4312 Vitreous hemorrhage, left eye: Secondary | ICD-10-CM | POA: Diagnosis not present

## 2022-04-13 LAB — RESP PANEL BY RT-PCR (RSV, FLU A&B, COVID)  RVPGX2
Influenza A by PCR: NEGATIVE
Influenza B by PCR: NEGATIVE
Resp Syncytial Virus by PCR: NEGATIVE
SARS Coronavirus 2 by RT PCR: NEGATIVE

## 2022-04-13 LAB — DIFFERENTIAL
Abs Immature Granulocytes: 0.02 10*3/uL (ref 0.00–0.07)
Basophils Absolute: 0 10*3/uL (ref 0.0–0.1)
Basophils Relative: 0 %
Eosinophils Absolute: 0.2 10*3/uL (ref 0.0–0.5)
Eosinophils Relative: 2 %
Immature Granulocytes: 0 %
Lymphocytes Relative: 30 %
Lymphs Abs: 2.1 10*3/uL (ref 0.7–4.0)
Monocytes Absolute: 0.8 10*3/uL (ref 0.1–1.0)
Monocytes Relative: 11 %
Neutro Abs: 4 10*3/uL (ref 1.7–7.7)
Neutrophils Relative %: 57 %

## 2022-04-13 LAB — COMPREHENSIVE METABOLIC PANEL
ALT: 24 U/L (ref 0–44)
AST: 22 U/L (ref 15–41)
Albumin: 4.6 g/dL (ref 3.5–5.0)
Alkaline Phosphatase: 63 U/L (ref 38–126)
Anion gap: 11 (ref 5–15)
BUN: 16 mg/dL (ref 8–23)
CO2: 27 mmol/L (ref 22–32)
Calcium: 9.7 mg/dL (ref 8.9–10.3)
Chloride: 99 mmol/L (ref 98–111)
Creatinine, Ser: 1.11 mg/dL (ref 0.61–1.24)
GFR, Estimated: >60 mL/min (ref >60)
Glucose, Bld: 131 mg/dL — ABNORMAL HIGH (ref 70–99)
Potassium: 3.4 mmol/L — ABNORMAL LOW (ref 3.5–5.1)
Sodium: 137 mmol/L (ref 135–145)
Total Bilirubin: 1 mg/dL (ref 0.3–1.2)
Total Protein: 7.4 g/dL (ref 6.5–8.1)

## 2022-04-13 LAB — CBC
HCT: 47.4 % (ref 39.0–52.0)
Hemoglobin: 16.8 g/dL (ref 13.0–17.0)
MCH: 32.7 pg (ref 26.0–34.0)
MCHC: 35.4 g/dL (ref 30.0–36.0)
MCV: 92.4 fL (ref 80.0–100.0)
Platelets: 236 10*3/uL (ref 150–400)
RBC: 5.13 MIL/uL (ref 4.22–5.81)
RDW: 11.7 % (ref 11.5–15.5)
WBC: 7.2 10*3/uL (ref 4.0–10.5)
nRBC: 0 % (ref 0.0–0.2)

## 2022-04-13 LAB — URINALYSIS, ROUTINE W REFLEX MICROSCOPIC
Bilirubin Urine: NEGATIVE
Glucose, UA: NEGATIVE mg/dL
Hgb urine dipstick: NEGATIVE
Ketones, ur: NEGATIVE mg/dL
Leukocytes,Ua: NEGATIVE
Nitrite: NEGATIVE
Protein, ur: NEGATIVE mg/dL
Specific Gravity, Urine: 1.015 (ref 1.005–1.030)
pH: 5 (ref 5.0–8.0)

## 2022-04-13 LAB — RAPID URINE DRUG SCREEN, HOSP PERFORMED
Amphetamines: NOT DETECTED
Barbiturates: NOT DETECTED
Benzodiazepines: NOT DETECTED
Cocaine: NOT DETECTED
Opiates: NOT DETECTED
Tetrahydrocannabinol: NOT DETECTED

## 2022-04-13 LAB — APTT: aPTT: 30 seconds (ref 24–36)

## 2022-04-13 LAB — ETHANOL: Alcohol, Ethyl (B): <10 mg/dL (ref <10)

## 2022-04-13 LAB — PROTIME-INR
INR: 1 (ref 0.8–1.2)
Prothrombin Time: 12.9 seconds (ref 11.4–15.2)

## 2022-04-13 MED ORDER — SODIUM CHLORIDE 0.9 % IV SOLN: 100.0000 mL/h | INTRAVENOUS | Status: DC

## 2022-04-13 MED ORDER — SODIUM CHLORIDE 0.9 % IV BOLUS
500.0000 mL | Freq: Once | INTRAVENOUS | Status: AC
  Administered 2022-04-13: 500 mL via INTRAVENOUS

## 2022-04-13 MED ORDER — IOHEXOL 350 MG/ML SOLN
100.0000 mL | Freq: Once | INTRAVENOUS | Status: AC | PRN
  Administered 2022-04-13: 75 mL via INTRAVENOUS

## 2022-04-13 NOTE — ED Provider Notes (Addendum)
[facility] EMERGENCY DEPT Provider Note   CSN: [id number] Arrival date & time: 04/13/22  1917     History  Chief Complaint  Patient presents with   Blurred Vision    [name] Page is a 72 y.o. male.  HPI Patient has a history of depression rhinitis hypertension, neuropathy cataracts.  Patient presents to the ED for evaluation of symptoms that started about 4:00 this afternoon.  Patient states he noticed the left side of his vision started to become blurry.  He noticed it was primarily in the lower left part of his vision.  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 Authorizing Provider  Acetylcarnitine HCl (ACETYL L-CARNITINE PO) Take by mouth.    [provider]  Ascorbic Acid (VITAMIN C) 1000 MG tablet  04/05/20   [provider]  atorvastatin (LIPITOR) 10 MG tablet Take 1 tablet (10 mg total) by mouth daily. 12/22/21   Marin Olp, MD  b complex vitamins tablet Take 1 tablet by mouth daily.    [provider]  buPROPion (WELLBUTRIN XL) 300 MG 24 hr tablet TAKE 1 TABLET BY MOUTH DAILY 02/04/22   Marin Olp, MD  Cholecalciferol (VITAMIN D) 2000 UNITS CAPS Take 1 capsule by mouth daily.    [provider]  clobetasol (TEMOVATE) 0.05 % external solution Apply 1 application Externally once a day for 30 days. 02/19/22     clonazePAM (KLONOPIN) 0.5 MG tablet Take 1 tablet (0.5 mg total) by mouth 2 (two) times daily as needed (do not drive for 12 hours after taking). for anxiety Patient not taking: Reported on 02/11/2022 11/24/21   Marin Olp, MD  Coenzyme Q10 (CO Q 10 PO) Take 1 capsule by mouth daily.    [provider]  COVID-19 mRNA vaccine [phone] (COMIRNATY) syringe Inject into the muscle. 03/23/22    Carlyle Basques, MD  diltiazem (CARDIZEM CD) 180 MG 24 hr capsule TAKE 1 CAPSULE BY MOUTH DAILY 12/08/21   Marin Olp, MD  gabapentin (NEURONTIN) 300 MG capsule TAKE 2 CAPSULES BY MOUTH AT  BEDTIME 12/08/21   Marin Olp, MD  hydrochlorothiazide (HYDRODIURIL) 25 MG tablet Take 1 tablet (25 mg total) by mouth daily. 02/15/22   Marin Olp, MD  HYDROcodone-acetaminophen (NORCO/VICODIN) 5-325 MG tablet Take 0.5-1 tablets by mouth 2 (two) times daily as needed. Do not use indicated instructions more than one to three times per week. 02/11/22   Marin Olp, MD  methylcellulose (CITRUCEL) oral powder Use as directed once daily 04/21/21   Armbruster, Carlota Raspberry, MD  NEEDLE, DISP, 22 G 22G X 1" MISC Used to inject testosterone weekly 08/25/21   Elayne Snare, MD  potassium chloride (KLOR-CON) 10 MEQ tablet TAKE 3 TABLETS BY MOUTH IN  THE MORNING AND 2 TABLETS  BY MOUTH IN THE AFTERNOON 08/11/21   Marin Olp, MD  Saw Palmetto, Serenoa repens, (SAW PALMETTO PO) Take 1 capsule by mouth 2 (two) times daily.    [provider]  testosterone cypionate (DEPOTESTOSTERONE CYPIONATE) 200 MG/ML injection INJECT INTRAMUSCULARLY 0.3  ML WEEKLY (DISCARD UNUSED  AFTER FIRST USE) 11/25/21   Elayne Snare, MD  Zinc 15 MG CAPS Take by mouth.    [provider]  omeprazole (PRILOSEC) 40 MG capsule Take  1 capsule (40 mg total) by mouth daily. 06/10/20 06/17/20  Marin Olp, MD      Allergies    Bee venom, Flagyl [metronidazole hcl], Nsaids, and Protonix [pantoprazole]    Review of Systems   Review of Systems  Physical Exam Updated Vital Signs BP (!) 178/77   Pulse 81   Temp 98 F (36.7 C)   Resp 18   Ht 1.727 m (5\' 8" )   Wt 82 kg   SpO2 98%   BMI 27.49 kg/m  Physical Exam Vitals and nursing note reviewed.  Constitutional:      General: He is not in acute distress.    Appearance: He is well-developed.  HENT:     Head: Normocephalic and atraumatic.     Right Ear:  External ear normal.     Left Ear: External ear normal.  Eyes:     General: No visual field deficit or scleral icterus.       Right eye: No discharge.        Left eye: No discharge.     Conjunctiva/sclera: Conjunctivae normal.  Neck:     Trachea: No tracheal deviation.  Cardiovascular:     Rate and Rhythm: Normal rate and regular rhythm.  Pulmonary:     Effort: Pulmonary effort is normal. No respiratory distress.     Breath sounds: Normal breath sounds. No stridor. No wheezing or rales.  Abdominal:     General: Bowel sounds are normal. There is no distension.     Palpations: Abdomen is soft.     Tenderness: There is no abdominal tenderness. There is no guarding or rebound.  Musculoskeletal:        General: No tenderness.     Cervical back: Neck supple.  Skin:    General: Skin is warm and dry.     Findings: No rash.  Neurological:     Mental Status: He is alert and oriented to person, place, and time.     Cranial Nerves: No cranial nerve deficit, dysarthria or facial asymmetry.     Sensory: No sensory deficit.     Motor: No abnormal muscle tone, seizure activity or pronator drift.     Coordination: Coordination normal.     Comments:  able to hold both legs off bed for 5 seconds, sensation intact in all extremities,  no left or right sided neglect, normal finger-nose exam bilaterally, no nystagmus noted, no field cuts except possible around 5 oclock position left eye  Psychiatric:        Mood and Affect: Mood normal.     ED Results / Procedures / Treatments   Labs (all labs ordered are listed, but only abnormal results are displayed) Labs Reviewed  COMPREHENSIVE METABOLIC PANEL - Abnormal; Notable for the following components:      Result Value   Potassium 3.4 (*)    Glucose, Bld 131 (*)    All other components within normal limits  RESP PANEL BY RT-PCR (RSV, FLU A&B, COVID)  RVPGX2  ETHANOL  PROTIME-INR  APTT  CBC  DIFFERENTIAL  RAPID URINE DRUG SCREEN, HOSP  PERFORMED  URINALYSIS, ROUTINE W REFLEX MICROSCOPIC    EKG EKG Interpretation  Date/Time:  Tuesday April 13 2022 19:54:43 EST Ventricular Rate:  73 PR Interval:    QRS Duration: 101 QT Interval:  389 QTC Calculation: 429 R Axis:   36 Text Interpretation: Sinus rhythm RSR' in V1 or V2, right VCD or RVH No significant change since last tracing Confirmed by  Dorie Rank [phone]) on 04/13/2022 8:19:46 PM  Radiology CT ANGIO HEAD NECK W WO CM  Result Date: 04/13/2022 CLINICAL DATA:  Stroke, follow up stroke EXAM: CT ANGIOGRAPHY HEAD AND NECK TECHNIQUE: Multidetector CT imaging of the head and neck was performed using the standard protocol during bolus administration of intravenous contrast. Multiplanar CT image reconstructions and MIPs were obtained to evaluate the vascular anatomy. Carotid stenosis measurements (when applicable) are obtained utilizing NASCET criteria, using the distal internal carotid diameter as the denominator. RADIATION DOSE REDUCTION: This exam was performed according to the departmental dose-optimization program which includes automated exposure control, adjustment of the mA and/or kV according to patient size and/or use of iterative reconstruction technique. CONTRAST:  24mL OMNIPAQUE IOHEXOL 350 MG/ML SOLN COMPARISON:  None Available. FINDINGS: CT HEAD FINDINGS Brain: No evidence of acute large vascular territory infarction, hemorrhage, hydrocephalus, extra-axial collection or mass lesion/mass effect. Vascular: Detailed below. Skull: No acute fracture. Sinuses/Orbits: No acute finding. Other: Small left mastoid effusion. Review of the MIP images confirms the above findings CTA NECK FINDINGS Aortic arch: Great vessel origins are patent without significant stenosis. Right carotid system: No evidence of dissection, stenosis (50% or greater), or occlusion. Left carotid system: No evidence of dissection, stenosis (50% or greater), or occlusion. Vertebral arteries: Left dominant. No  evidence of dissection, stenosis (50% or greater), or occlusion. Skeleton: No acute abnormality. Other neck: No acute abnormality. Upper chest: Visualized lung apices are clear. Review of the MIP images confirms the above findings CTA HEAD FINDINGS Anterior circulation: Bilateral intracranial ICAs MCAs, and ACAs are patent without proximal hemodynamically significant stenosis. Posterior circulation: Bilateral low intradural vertebral arteries, basilar artery, and bilateral posterior cerebral arteries are patent without proximal hemodynamically significant stenosis. Venous sinuses: Not well evaluated due to arterial timing. Review of the MIP images confirms the above findings IMPRESSION: 1. No evidence of acute intracranial abnormality. 2. No large vessel occlusion or proximal hemodynamically significant stenosis. Electronically Signed   By: Margaretha Sheffield M.D.   On: 04/13/2022 20:55    Procedures Procedures    Medications Ordered in ED Medications  sodium chloride 0.9 % bolus 500 mL (500 mLs Intravenous New Bag/Given 04/13/22 2059)    Followed by  0.9 %  sodium chloride infusion (has no administration in time range)  iohexol (OMNIPAQUE) 350 MG/ML injection 100 mL (75 mLs Intravenous Contrast Given 04/13/22 2027)    ED Course/ Medical Decision Making/ A&P Clinical Course as of 04/13/22 2132  Tue Apr 13, 2022  2104 CBC CBC normal.  Metabolic panel normal. [JK]  2104 Head CT without acute abnormalities.  No large vessel or proximally hemodynamic significant stenosis [JK]  2111 Discussed with Dr Cheral Marker, would recommend admission to hospital further workup, MRI.  If beds are not taking too long then admission, other wise would do ED to ED. [JK]    Clinical Course User Index [JK] Dorie Rank, MD                           Medical Decision Making Differential diagnosis includes but not limited to stroke, TIA, retinal detachment, retinal artery occlusion  Problems Addressed: Vision changes: acute  illness or injury that poses a threat to life or bodily functions  Amount and/or Complexity of Data Reviewed Labs: ordered. Decision-making details documented in ED Course. Radiology: ordered and independent interpretation performed. Discussion of management or test interpretation with external provider(s): Case discussed with Dr. Cheral Marker neurology  Risk Prescription drug management.  Patient presents with acute vision changes.  Otherwise no other neurologic symptoms.  Patient does have visual field loss and around the 5 o'clock position left eye.  Labs are unremarkable.  CT scan does not show any signs of acute large vessel occlusion.  I discussed the case with Dr. Cheral Marker neurology.  He recommends ED to ED transfer so we can get an MRI performed more expeditiously.  I discussed admission to the hospital with the patient.  He prefers to not be admitted if possible but agrees to be transferred to [facility] for MRI and neurology consultation.  I notified Dr. Alvino Chapel and he accepts the patient in transfer.       Final Clinical Impression(s) / ED Diagnoses Final diagnoses:  Vision changes    Rx / DC Orders ED Discharge Orders     None         Dorie Rank, MD 04/13/22 2132 [facility] was here to transfer the patient.  He does not want to go via [facility].  Patient is concerned about the cost.  His wife will transport him to the ED   Dorie Rank, MD 04/13/22 2222

## 2022-04-13 NOTE — ED Notes (Signed)
EDP has spoken with patient regarding benefits of MRI. Patient voiced understanding the need for ED to ED. Patient has requested to go POV. VSS, No distress noted at this time. Pt A/O x4 and ambulatory. IV secured.

## 2022-04-13 NOTE — ED Notes (Signed)
Pt ambulated to and from bathroom with steady gait.

## 2022-04-13 NOTE — ED Triage Notes (Signed)
Pt c/o partial left sided blurry vision since 1600. Pt states that he took 4 baby ASA.

## 2022-04-13 NOTE — ED Provider Notes (Signed)
  Physical Exam  BP (!) 148/79   Pulse 68   Temp 98 F (36.7 C)   Resp 16   Ht 5\' 8"  (1.727 m)   Wt 82 kg   SpO2 95%   BMI 27.49 kg/m   Physical Exam Constitutional:      Appearance: Normal appearance.  Neurological:     Mental Status: He is alert.     Comments: EOMI, PERRL, no facial droop, 5 out of 5 strength bilateral upper and lower extremities, sensation grossly intact, unilateral left eye visual field disturbance LLQ near nasal medial inferior region     Procedures  Ultrasound ED Ocular  Date/Time: 04/14/2022 12:59 AM  Performed by: Elgie Congo, MD Authorized by: Elgie Congo, MD   PROCEDURE DETAILS:    Indications: visual change     Assessed:  Left eye   Left eye axial view: obtained     Left eye saggital view: obtained     Images: archived     Limitations:  None RIGHT EYE FINDINGS:     vitreous hemorrhage in right eye Comments:     Hyperechoic material within vitreous, look c/w VH however suspect some associated RD.    ED Course / MDM   Clinical Course as of 04/14/22 0345  Tue Apr 13, 2022  2104 CBC CBC normal.  Metabolic panel normal. [JK]  2104 Head CT without acute abnormalities.  No large vessel or proximally hemodynamic significant stenosis [JK]  2111 Discussed with Dr Cheral Marker, would recommend admission to hospital further workup, MRI.  If beds are not taking too long then admission, other wise would do ED to ED. [JK]  Wed Apr 14, 2022  6606 S/w Dr Manuella Ghazi, on call ophthalmologist, see MDM.  [VB]    Clinical Course User Index [JK] Dorie Rank, MD [VB] Elgie Congo, MD   Medical Decision Making 72 year old male with history of HLD, neuropathy, cataracts who presents with brief unilateral painless left-sided visual loss since 4 PM today 04/13/2022.  On arrival patient notes that his vision has improved.  He may have only a slight visual field quadrant loss at his nasal bridge of the left eye but no associated visual field loss on  right eye.  He continues to endorse that it is only unilateral.  There were no other associated symptoms other than flashers and floaters.  I performed a bedside ultrasound which did show hyperechoic material within the vitreous, I suspect likely vitreous hemorrhage and possibly associated retinal detachment especially with the floaters and flashes.  I spoke with Dr. Manuella Ghazi who is on-call for ophthalmology who will see patient first thing in the morning.  I have provided patient with clinic information and Dr. Trena Platt number.  He will remain n.p.o. overnight.  Dr. Cheral Marker also performed exam on patient and has low suspicion for CVA. However, pursuing MRI brain w/o - which showed no evidence of acute stroke, incidental finding of large left mastoid effusion which patient is already aware of and has seen ENT for in the past.  Clinically no evidence of mastoiditis.  Neurology had no recommendations for antiplatelets at this time, provided with neurology outpatient follow-up and patient to see ophthalmology today for Singh in the morning./  Amount and/or Complexity of Data Reviewed Labs: ordered. Decision-making details documented in ED Course. Radiology: ordered.  Risk Prescription drug management.          Elgie Congo, MD 04/14/22 [id number]

## 2022-04-14 ENCOUNTER — Emergency Department (HOSPITAL_COMMUNITY): Payer: Medicare Other

## 2022-04-14 DIAGNOSIS — I614 Nontraumatic intracerebral hemorrhage in cerebellum: Secondary | ICD-10-CM | POA: Diagnosis not present

## 2022-04-14 DIAGNOSIS — H539 Unspecified visual disturbance: Secondary | ICD-10-CM

## 2022-04-14 DIAGNOSIS — H4312 Vitreous hemorrhage, left eye: Secondary | ICD-10-CM | POA: Diagnosis not present

## 2022-04-14 DIAGNOSIS — G459 Transient cerebral ischemic attack, unspecified: Secondary | ICD-10-CM | POA: Insufficient documentation

## 2022-04-14 DIAGNOSIS — H34232 Retinal artery branch occlusion, left eye: Secondary | ICD-10-CM | POA: Diagnosis not present

## 2022-04-14 DIAGNOSIS — H5462 Unqualified visual loss, left eye, normal vision right eye: Secondary | ICD-10-CM | POA: Diagnosis not present

## 2022-04-14 DIAGNOSIS — H34239 Retinal artery branch occlusion, unspecified eye: Secondary | ICD-10-CM | POA: Diagnosis not present

## 2022-04-14 MED ORDER — DIAZEPAM 5 MG/ML IJ SOLN
2.5000 mg | Freq: Once | INTRAMUSCULAR | Status: AC
  Administered 2022-04-14: 2.5 mg via INTRAVENOUS
  Filled 2022-04-14: qty 2

## 2022-04-14 NOTE — ED Notes (Signed)
Pt requesting IV to be removed, pt understands that he may need another IV if hospital admission occurs. This RN removed IV.

## 2022-04-14 NOTE — Consult Note (Addendum)
NEURO HOSPITALIST CONSULT NOTE   Requesting physician: Dr. Nechama Guard  Reason for Consult: Acute onset of left monocular vision loss  History obtained from:   Patient and Chart     HPI:                                                                                                                                          [name] Page is an 72 y.o. male with a PMHx as listed below, who initially presented to the [facility] ED on Tuesday evening for evaluation of acute onset left monocular blurred and obscured vision. Symptoms started at about 4 PM when he was working on a computer. He noticed the vision in his left eye to acutely become blurry with patches of vision also obscured. Soon after he called his wife to take a look at him, he saw a bright flash that she did not see. He experienced floaters at some point as well. The vision loss was painless and only affected the lower portions of the visual fields of his left eye. When he tested vision in his right eye it was normal in all quadrants, with no blurring. 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 He did take 4 baby ASA as a precautionary measure. After several hours the vision in his left eye returned to normal. He was sent to [facility] from [facility] for MRI brain. Ultrasound of the globes performed here by EDP reveal findings suspicious for vitreous hemorrhage and/or retinal/vitreous detachment.   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   Cataract    forming   Depressive disorder, not elsewhere classified    Diverticulitis    X 1 EPISODE   Esophageal reflux    past hx    HTN  (hypertension)    Hyperlipidemia    CONTROLLED   Lymphocytic colitis    Neuromuscular disorder (HCC)    neuropathy   Neuropathy    Other testicular hypofunction    Palpitations    Personal history of urinary calculi    LAST IN THE 80'S    Past Surgical History:  Procedure Laterality Date   COLONOSCOPY     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   [name]   parotid tumor surgery     [place]- BENIGN   POLYPECTOMY  TONSILLECTOMY AND ADENOIDECTOMY      Family History  Problem Relation Age of Onset   Hypertension Mother    Anxiety disorder Mother    Non-Hodgkin's lymphoma Mother    Dementia Father    Depression Father        and anxiety   Hypertension Father    Sudden death Paternal Uncle 42   Colon cancer Paternal Uncle    Hypertension Brother    Early death Neg Hx    Heart disease Neg Hx        mother in 32s had lesoin   Colon polyps Neg Hx    Esophageal cancer Neg Hx    Rectal cancer Neg Hx    Stomach cancer Neg Hx               Social History:  reports that he has never smoked. He has never used smokeless tobacco. He reports current alcohol use. He reports that he does not use drugs.  Allergies  Allergen Reactions   Bee Venom Anaphylaxis   Flagyl [Metronidazole Hcl] Other (See Comments)    Neuropathy    Nsaids     REACTION: intolereance- ABD PAIN AND DIARRHEA    Protonix [Pantoprazole]     Abdominal Pain and Diarrhea    HOME MEDICATIONS:                                                                                                                      No current facility-administered medications on file prior to encounter.   Current Outpatient Medications on File Prior to Encounter  Medication Sig Dispense Refill   Acetylcarnitine HCl (ACETYL L-CARNITINE PO) Take by mouth.     Ascorbic Acid (VITAMIN C) 1000 MG tablet      atorvastatin (LIPITOR) 10 MG tablet Take 1 tablet (10 mg total) by mouth daily. 90 tablet 3    b complex vitamins tablet Take 1 tablet by mouth daily.     buPROPion (WELLBUTRIN XL) 300 MG 24 hr tablet TAKE 1 TABLET BY MOUTH DAILY 90 tablet 3   Cholecalciferol (VITAMIN D) 2000 UNITS CAPS Take 1 capsule by mouth daily.     clobetasol (TEMOVATE) 0.05 % external solution Apply 1 application Externally once a day for 30 days. 25 mL 0   clonazePAM (KLONOPIN) 0.5 MG tablet Take 1 tablet (0.5 mg total) by mouth 2 (two) times daily as needed (do not drive for 12 hours after taking). for anxiety (Patient not taking: Reported on 02/11/2022) 180 tablet 1   Coenzyme Q10 (CO Q 10 PO) Take 1 capsule by mouth daily.     COVID-19 mRNA vaccine 2023-2024 (COMIRNATY) syringe Inject into the muscle. 0.3 mL 0   diltiazem (CARDIZEM CD) 180 MG 24 hr capsule TAKE 1 CAPSULE BY MOUTH DAILY 100 capsule 2   gabapentin (NEURONTIN) 300 MG capsule TAKE 2 CAPSULES BY MOUTH AT  BEDTIME 200 capsule 2   hydrochlorothiazide (HYDRODIURIL) 25 MG tablet Take 1 tablet (25 mg total) by  mouth daily. 90 tablet 3   HYDROcodone-acetaminophen (NORCO/VICODIN) 5-325 MG tablet Take 0.5-1 tablets by mouth 2 (two) times daily as needed. Do not use indicated instructions more than one to three times per week. 55 tablet 0   methylcellulose (CITRUCEL) oral powder Use as directed once daily     NEEDLE, DISP, 22 G 22G X 1" MISC Used to inject testosterone weekly 50 each 2   potassium chloride (KLOR-CON) 10 MEQ tablet TAKE 3 TABLETS BY MOUTH IN  THE MORNING AND 2 TABLETS  BY MOUTH IN THE AFTERNOON 500 tablet 2   Saw Palmetto, Serenoa repens, (SAW PALMETTO PO) Take 1 capsule by mouth 2 (two) times daily.     testosterone cypionate (DEPOTESTOSTERONE CYPIONATE) 200 MG/ML injection INJECT INTRAMUSCULARLY 0.3  ML WEEKLY (DISCARD UNUSED  AFTER FIRST USE) 3 mL 5   Zinc 15 MG CAPS Take by mouth.     [DISCONTINUED] omeprazole (PRILOSEC) 40 MG capsule Take 1 capsule (40 mg total) by mouth daily. 90 capsule 3     ROS:                                                                                                                                        As per HPI.    Blood pressure (!) 148/79, pulse 68, temperature 98 F (36.7 C), resp. rate 16, height 5\' 8"  (1.727 m), weight 82 kg, SpO2 95 %.   General Examination:                                                                                                       Physical Exam  HEENT-  [place]/AT    Lungs- Respirations unlabored Extremities- No edema  Neurological Examination Mental Status: Alert and fully oriented. Thought content appropriate.  Speech fluent without evidence of aphasia.  Able to follow all commands without difficulty. Cranial Nerves: II: OD: Visual fields intact in all 4 quadrants. No scotoma.  OS: Visual fields intact in all 4 quadrants. No scotoma.  PERRL 5 mm >> 3 mm bilaterally  III,IV, VI: No ptosis. EOMI. No nystagmus. Equivocal subtle esotropia.  V: Temp sensation equal bilaterally  VII: Smile symmetric VIII: Hearing intact to voice IX,X: No hoarseness XI: Symmetric shoulder shrug XII: Midline tongue extension Motor: BUE 5/5 proximally and distally BLE 5/5 proximally and distally  No pronator drift.  Sensory: Temp and light touch intact throughout, bilaterally. No extinction to DSS.  Deep Tendon Reflexes: 2+ and symmetric bilateral brachioradialis and  biceps. 1+ bilateral patellae. 0 bilateral achilles. Toes downgoing.  Cerebellar: No ataxia with FNF and H-S bilaterally  Gait: Deferred   Lab Results: Basic Metabolic Panel: Recent Labs  Lab 04/13/22 1943  NA 137  K 3.4*  CL 99  CO2 27  GLUCOSE 131*  BUN 16  CREATININE 1.11  CALCIUM 9.7    CBC: Recent Labs  Lab 04/13/22 1943  WBC 7.2  NEUTROABS 4.0  HGB 16.8  HCT 47.4  MCV 92.4  PLT 236    Cardiac Enzymes: No results for input(s): "CKTOTAL", "CKMB", "CKMBINDEX", "TROPONINI" in the last 168 hours.  Lipid Panel: No results for input(s): "CHOL", "TRIG", "HDL", "CHOLHDL", "VLDL",  "LDLCALC" in the last 168 hours.  Imaging: CT ANGIO HEAD NECK W WO CM  Result Date: 04/13/2022 CLINICAL DATA:  Stroke, follow up stroke EXAM: CT ANGIOGRAPHY HEAD AND NECK TECHNIQUE: Multidetector CT imaging of the head and neck was performed using the standard protocol during bolus administration of intravenous contrast. Multiplanar CT image reconstructions and MIPs were obtained to evaluate the vascular anatomy. Carotid stenosis measurements (when applicable) are obtained utilizing NASCET criteria, using the distal internal carotid diameter as the denominator. RADIATION DOSE REDUCTION: This exam was performed according to the departmental dose-optimization program which includes automated exposure control, adjustment of the mA and/or kV according to patient size and/or use of iterative reconstruction technique. CONTRAST:  69mL OMNIPAQUE IOHEXOL 350 MG/ML SOLN COMPARISON:  None Available. FINDINGS: CT HEAD FINDINGS Brain: No evidence of acute large vascular territory infarction, hemorrhage, hydrocephalus, extra-axial collection or mass lesion/mass effect. Vascular: Detailed below. Skull: No acute fracture. Sinuses/Orbits: No acute finding. Other: Small left mastoid effusion. Review of the MIP images confirms the above findings CTA NECK FINDINGS Aortic arch: Great vessel origins are patent without significant stenosis. Right carotid system: No evidence of dissection, stenosis (50% or greater), or occlusion. Left carotid system: No evidence of dissection, stenosis (50% or greater), or occlusion. Vertebral arteries: Left dominant. No evidence of dissection, stenosis (50% or greater), or occlusion. Skeleton: No acute abnormality. Other neck: No acute abnormality. Upper chest: Visualized lung apices are clear. Review of the MIP images confirms the above findings CTA HEAD FINDINGS Anterior circulation: Bilateral intracranial ICAs MCAs, and ACAs are patent without proximal hemodynamically significant stenosis.  Posterior circulation: Bilateral low intradural vertebral arteries, basilar artery, and bilateral posterior cerebral arteries are patent without proximal hemodynamically significant stenosis. Venous sinuses: Not well evaluated due to arterial timing. Review of the MIP images confirms the above findings IMPRESSION: 1. No evidence of acute intracranial abnormality. 2. No large vessel occlusion or proximal hemodynamically significant stenosis. Electronically Signed   By: Margaretha Sheffield M.D.   On: 04/13/2022 20:55     Assessment: 72 year old male with acute onset of left monocular vision loss - Exam reveals no focal deficits. Visual fields intact OU with equally reactive pupils. No focal motor or sensory deficit. No ataxia.  - CTA of head and neck: . No evidence of acute intracranial abnormality. No large vessel occlusion or proximal hemodynamically significant stenosis.  - Ultrasound of the globes performed here by EDP reveal findings suspicious for vitreous hemorrhage and/or retinal/vitreous detachment. EDP has discussed the images with Ophthalmology and patient is scheduled for outpatient Ophthalmology evaluation in the AM.  - Low likelihood of BRAO given ultrasound result, but will obtain MRI brain to rule out a small acute lacunar infarction. Also will be useful to assess for left monocular signal changes within the vitreous chamber of the  eye.   Recommendations: - MRI brain  - Outpatient Ophthalmology evaluation.   Addendum: - MRI brain: Normal brain MRI for age. No acute intracranial abnormality identified. Moderate to large left mastoid effusion. At the limited resolution of the study, no intraocular abnormalities are seen.   - Consider starting ASA 81 mg po qd, TTE, Zio patch and outpatient Neurology follow up if Ophthalmology evaluation reveals no intraocular abnormality to explain the transient monocular vision loss, as BRAO would then be higher on the DDx.  - Discussed with the patient  over the telephone, who expressed understanding and agreement with the plan. All questions answered.  - At patient's request, I am sending copies of this note to Dr. Georgia Dom and to his PCP Dr. [name]  Electronically signed: Dr. Kerney Elbe 04/14/2022, 1:03 AM

## 2022-04-14 NOTE — ED Notes (Signed)
Patient verbalizes understanding of d/c instructions. Opportunities for questions and answers were provided. Pt d/c from ED and ambulated to lobby with wife.

## 2022-04-14 NOTE — Discharge Instructions (Addendum)
Go to [facility] tomorrow morning at 0730 AM (they open at [place]). We suspect you could have a vitreous hemorrhage.  [place], [place] [address]   Dr Trena Platt phone number if any issues : [phone]  Do not eat or drink anything tonight until being seen by the Eye doctor.  Incidentally, your MRI showed a mastoid effusion on the left. It is unclear the underlying cause of this, but please call ENT who we have referred you to in your discharge paperwork to make an appointment. (Dr Benjamine Mola)  Follow-up with neurology in the outpatient setting regarding your visit to the ER today.  As we discussed, there is no evidence of stroke on your MRI.  Come back if any signs of stroke such as slurred speech, worsening visual changes, weakness in the arms or legs, or any other symptoms concerning to you.

## 2022-04-14 NOTE — ED Notes (Signed)
Patient transported to MRI 

## 2022-04-15 ENCOUNTER — Encounter: Payer: Self-pay | Admitting: Cardiology

## 2022-04-15 ENCOUNTER — Ambulatory Visit (INDEPENDENT_AMBULATORY_CARE_PROVIDER_SITE_OTHER): Payer: Medicare Other

## 2022-04-15 ENCOUNTER — Ambulatory Visit: Payer: Medicare Other | Attending: Cardiology | Admitting: Cardiology

## 2022-04-15 VITALS — BP 132/72 | HR 70 | Ht 68.0 in | Wt 189.0 lb

## 2022-04-15 DIAGNOSIS — G459 Transient cerebral ischemic attack, unspecified: Secondary | ICD-10-CM

## 2022-04-15 DIAGNOSIS — H34239 Retinal artery branch occlusion, unspecified eye: Secondary | ICD-10-CM

## 2022-04-15 DIAGNOSIS — I700 Atherosclerosis of aorta: Secondary | ICD-10-CM | POA: Diagnosis not present

## 2022-04-15 NOTE — Progress Notes (Unsigned)
Enrolled patient for a 14 day Zio XT  monitor to be mailed to patients home  °

## 2022-04-15 NOTE — Progress Notes (Signed)
Cardiology Office Note:    Date:  04/15/2022   ID:  [name] Page, DOB 05/01/1950, MRN [id number]  PCP:  Marin Olp, MD   [facility] Providers Cardiologist:  None     Referring MD: Marin Olp, MD    History of Present Illness:    [name] Page is a 72 y.o. male here for the evaluation of retinal artery branch occlusion at the request of Dr. [name].  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  He is here to obtain orders for echocardiogram, carotid Dopplers as well as ZIO monitor.  Like a TIA, it is resolved. Dr. Mosetta Anis. Left eye.   Was working on computer and black spot, and flash.   He was in the emergency department on 04/13/2022 with visual changes.  RLL 2011 RLLectomy. Cancer Diverticulitis Flagyl neurophathy  No TOB No early CAD hx  PAC's in the past ECHO mildly reduced EF in the past.   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   Cataract    forming   Depressive disorder, not elsewhere classified    Diverticulitis    X 1 EPISODE   Esophageal reflux    past hx    HTN (hypertension)    Hyperlipidemia    CONTROLLED   Lymphocytic colitis    Neuromuscular disorder (HCC)    neuropathy   Neuropathy    Other testicular hypofunction    Palpitations    Personal history of urinary calculi    LAST IN THE 80'S    Past Surgical History:  Procedure Laterality Date   COLONOSCOPY     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   Ascorbic Acid  (VITAMIN C) 1000 MG tablet    aspirin EC (ASPIRIN ADULT LOW DOSE) 81 MG tablet Take 81 mg by mouth once.   atorvastatin (LIPITOR) 10 MG tablet Take 1 tablet (10 mg total) by mouth daily.   b complex vitamins tablet Take 1 tablet by mouth daily.   buPROPion (WELLBUTRIN XL) 300 MG 24 hr tablet TAKE 1 TABLET BY MOUTH DAILY   Cholecalciferol (VITAMIN D) 2000 UNITS CAPS Take 1 capsule by mouth daily.   clonazePAM (KLONOPIN) 0.5 MG tablet Take 1 tablet (0.5 mg total) by mouth 2 (two) times daily as needed (do not drive for 12 hours after taking). for anxiety   diltiazem (CARDIZEM CD) 180 MG 24 hr capsule TAKE 1 CAPSULE BY MOUTH DAILY   gabapentin (NEURONTIN) 300 MG capsule TAKE 2 CAPSULES BY MOUTH AT  BEDTIME   hydrochlorothiazide (HYDRODIURIL) 25 MG tablet Take 1 tablet (25 mg total) by mouth daily.   HYDROcodone-acetaminophen (NORCO/VICODIN) 5-325 MG tablet Take 0.5-1 tablets by mouth 2 (two) times daily as needed. Do not use indicated instructions more than one to three times per week.   methylcellulose (CITRUCEL) oral powder Use as directed once daily  NEEDLE, DISP, 22 G 22G X 1" MISC Used to inject testosterone weekly   potassium chloride (KLOR-CON) 10 MEQ tablet TAKE 3 TABLETS BY MOUTH IN  THE MORNING AND 2 TABLETS  BY MOUTH IN THE AFTERNOON   Saw Palmetto, Serenoa repens, (SAW PALMETTO PO) Take 1 capsule by mouth 2 (two) times daily.   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 Married 30 + years. No children, lots of critters.       Cancer Survivor- carcinoid lung cancer.   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 Martin Majestic back to work for ability to be insured.       Hobbies: music, home repair/improvement, cats   Social Determinants of Health   Financial Resource Strain: Low Risk  (06/19/2021)   Overall Financial Resource Strain (CARDIA)    Difficulty of Paying Living Expenses: Not hard at all  Food Insecurity: No Food Insecurity (06/19/2021)   Hunger Vital Sign    Worried About Running Out of Food in the Last Year: Never true    Ran Out of Food in the Last Year: Never true  Transportation Needs: No Transportation Needs (06/19/2021)   PRAPARE - Hydrologist (Medical): No    Lack of Transportation (Non-Medical): No  Physical Activity: Sufficiently Active (06/19/2021)   Exercise Vital Sign    Days of Exercise per Week: 6 days    Minutes of Exercise per Session: 50 min  Stress: No Stress Concern Present (06/19/2021)   [place]    Feeling of Stress : Not at all  Social Connections: Socially Isolated (06/19/2021)   Social Connection and Isolation Panel [NHANES]    Frequency of Communication with Friends and Family: Once a week    Frequency of Social Gatherings with Friends and Family: Once a week    Attends Religious Services: Never    Marine scientist or Organizations: No    Attends  Music therapist: Never    Marital Status: Married     Family History: The patient's family history includes Anxiety disorder in his mother; Colon cancer in his paternal uncle; Dementia in his father; Depression in his father; Hypertension in his brother, father, and mother; Non-Hodgkin's lymphoma in his mother; Sudden death (age of onset: 33) in his paternal uncle. There is  no history of Early death, Heart disease, Colon polyps, Esophageal cancer, Rectal cancer, or Stomach cancer.  ROS:   Please see the history of present illness.     All other systems reviewed and are negative.  EKGs/Labs/Other Studies Reviewed:    The following studies were reviewed today: ER notes, CT scans, lab work reviewed.  Prior LDL 58.  Hemoglobin A1c 5.9.  EKG: Sinus rhythm 73 no other significant abnormalities.  Recent Labs: 04/13/2022: ALT 24; BUN 16; Creatinine, Ser 1.11; Hemoglobin 16.8; Platelets 236; Potassium 3.4; Sodium 137  Recent Lipid Panel    Component Value Date/Time   CHOL 132 02/11/2022 1656   TRIG 116.0 02/11/2022 1656   HDL 50.90 02/11/2022 1656   CHOLHDL 3 02/11/2022 1656   VLDL 23.2 02/11/2022 1656   LDLCALC 58 02/11/2022 1656   LDLCALC 69 02/14/2020 0901     Risk Assessment/Calculations:               Physical Exam:    VS:  BP 132/72   Pulse 70   Ht 5\' 8"  (1.727 m)   Wt 189 lb (85.7 kg)   SpO2 96%   BMI 28.74 kg/m     Wt Readings from Last 3 Encounters:  04/15/22 189 lb (85.7 kg)  04/13/22 180 lb 12.4 oz (82 kg)  02/11/22 181 lb (82.1 kg)     GEN:  Well nourished, well developed in no acute distress HEENT: Normal NECK: No JVD; No carotid bruits LYMPHATICS: No lymphadenopathy CARDIAC: RRR, no murmurs, rubs, gallops RESPIRATORY:  Clear to auscultation without rales, wheezing or rhonchi  ABDOMEN: Soft, non-tender, non-distended MUSCULOSKELETAL:  No edema; No deformity  SKIN: Warm and dry NEUROLOGIC:  Alert and oriented x 3 PSYCHIATRIC:  Normal  affect   ASSESSMENT:    1. Branch retinal artery occlusion, unspecified laterality   2. TIA (transient ischemic attack)   3. Aortic atherosclerosis (HCC)    PLAN:    In order of problems listed above:    Retinal artery branch occlusion Aortic atherosclerosis - Transient vision loss left eye, essentially TIA of the visual system. - We will check an echocardiogram to ensure proper structure and function of his heart to make sure that there is no evidence of any mass or embolic source. -We will check a ZIO monitor to ensure that he does not have any evidence of atrial fibrillation. -I will check carotid Dopplers to get additional visualization of his carotid arteries -Agree with atorvastatin.  He did have some perceived worsening of neuropathy which he attributed to Flagyl in the past.  Took 4 weeks off of atorvastatin but then went back on after this event.  I would like for him to continue with statin therapy.  Could consider transition over to Crestor 10 mg or 5 mg a day.  I would tend to avoid the use of Zocor or simvastatin at this time secondary to drug drug interactions. Personally reviewed and interpreted prior CT scan from 2021 demonstrating is aortic atherosclerosis.  Fairly small burden.  Will follow up with results          Medication Adjustments/Labs and Tests Ordered: Current medicines are reviewed at length with the patient today.  Concerns regarding medicines are outlined above.  Orders Placed This Encounter  Procedures   LONG TERM MONITOR (3-14 DAYS)   ECHOCARDIOGRAM COMPLETE   VAS US CAROTID   No orders of the defined types were placed in this encounter.   Patient Instructions  Medication Instructions:  The  current medical regimen is effective;  continue present plan and medications.  *If you need a refill on your cardiac medications before your next appointment, please call your pharmacy*  Testing/Procedures: Your physician has requested that you have  an echocardiogram. Echocardiography is a painless test that uses sound waves to create images of your heart. It provides your doctor with information about the size and shape of your heart and how well your heart's chambers and valves are working. This procedure takes approximately one hour. There are no restrictions for this procedure. Please do NOT wear cologne, perfume, aftershave, or lotions (deodorant is allowed). Please arrive 15 minutes prior to your appointment time.  Your physician has requested that you have a carotid duplex. This test is an ultrasound of the carotid arteries in your neck. It looks at blood flow through these arteries that supply the brain with blood. Allow one hour for this exam. There are no restrictions or special instructions.  ZIO XT- Long Term Monitor Instructions  Your physician has requested you wear a ZIO patch monitor for 14 days.  This is a single patch monitor. [employer] supplies one patch monitor per enrollment. Additional stickers are not available. Please do not apply patch if you will be having a Nuclear Stress Test,  Echocardiogram, Cardiac CT, MRI, or Chest Xray during the period you would be wearing the  monitor. The patch cannot be worn during these tests. You cannot remove and re-apply the  ZIO XT patch monitor.  Your ZIO patch monitor will be mailed 3 day USPS to your address on file. It may take 3-5 days  to receive your monitor after you have been enrolled.  Once you have received your monitor, please review the enclosed instructions. Your monitor  has already been registered assigning a specific monitor serial # to you.  Billing and Patient Assistance Program Information  We have supplied [employer] with any of your insurance information on file for billing purposes. [employer] offers a sliding scale Patient Assistance Program for patients that do not have  insurance, or whose insurance does not completely cover the cost of the ZIO monitor.  You  must apply for the Patient Assistance Program to qualify for this discounted rate.  To apply, please call [employer] at [phone], select option 4, select option 2, ask to apply for  Patient Assistance Program. Theodore Demark will ask your household income, and how many people  are in your household. They will quote your out-of-pocket cost based on that information.  [employer] will also be able to set up a 30-month, interest-free payment plan if needed.  Applying the monitor   Shave hair from upper left chest.  Hold abrader disc by orange tab. Rub abrader in 40 strokes over the upper left chest as  indicated in your monitor instructions.  Clean area with 4 enclosed alcohol pads. Let dry.  Apply patch as indicated in monitor instructions. Patch will be placed under collarbone on left  side of chest with arrow pointing upward.  Rub patch adhesive wings for 2 minutes. Remove white label marked "1". Remove the white  label marked "2". Rub patch adhesive wings for 2 additional minutes.  While looking in a mirror, press and release button in center of patch. A small green light will  flash 3-4 times. This will be your only indicator that the monitor has been turned on.  Do not shower for the first 24 hours. You may shower after the first 24 hours.  Press the button if  you feel a symptom. You will hear a small click. Record Date, Time and  Symptom in the Patient Logbook.  When you are ready to remove the patch, follow instructions on the last 2 pages of Patient  Logbook. Stick patch monitor onto the last page of Patient Logbook.  Place Patient Logbook in the blue and white box. Use locking tab on box and tape box closed  securely. The blue and white box has prepaid postage on it. Please place it in the mailbox as  soon as possible. Your physician should have your test results approximately 7 days after the  monitor has been mailed back to [facility].  Call [place] at [phone]  if you have questions regarding  your ZIO XT patch monitor. Call them immediately if you see an orange light blinking on your  monitor.  If your monitor falls off in less than 4 days, contact our Monitor department at [phone].  If your monitor becomes loose or falls off after 4 days call [employer] at [phone] for  suggestions on securing your monitor  Follow-Up: At [facility], you and your health needs are our priority.  As part of our continuing mission to provide you with exceptional heart care, we have created designated Provider Care Teams.  These Care Teams include your primary Cardiologist (physician) and Advanced Practice Providers (APPs -  Physician Assistants and Nurse Practitioners) who all work together to provide you with the care you need, when you need it.  We recommend signing up for the patient portal called "MyChart".  Sign up information is provided on this After Visit Summary.  MyChart is used to connect with patients for Virtual Visits (Telemedicine).  Patients are able to view lab/test results, encounter notes, upcoming appointments, etc.  Non-urgent messages can be sent to your provider as well.   To learn more about what you can do with MyChart, go to [email or web address].    Your next appointment:   Follow up will be based on the results of the above testing.     Signed, Candee Furbish, MD  04/15/2022 5:16 PM    [place] [facility]

## 2022-04-15 NOTE — Patient Instructions (Addendum)
Medication Instructions:  The current medical regimen is effective;  continue present plan and medications.  *If you need a refill on your cardiac medications before your next appointment, please call your pharmacy*  Testing/Procedures: Your physician has requested that you have an echocardiogram. Echocardiography is a painless test that uses sound waves to create images of your heart. It provides your doctor with information about the size and shape of your heart and how well your heart's chambers and valves are working. This procedure takes approximately one hour. There are no restrictions for this procedure. Please do NOT wear cologne, perfume, aftershave, or lotions (deodorant is allowed). Please arrive 15 minutes prior to your appointment time.  Your physician has requested that you have a carotid duplex. This test is an ultrasound of the carotid arteries in your neck. It looks at blood flow through these arteries that supply the brain with blood. Allow one hour for this exam. There are no restrictions or special instructions.  ZIO XT- Long Term Monitor Instructions  Your physician has requested you wear a ZIO patch monitor for 14 days.  This is a single patch monitor. [employer] supplies one patch monitor per enrollment. Additional stickers are not available. Please do not apply patch if you will be having a Nuclear Stress Test,  Echocardiogram, Cardiac CT, MRI, or Chest Xray during the period you would be wearing the  monitor. The patch cannot be worn during these tests. You cannot remove and re-apply the  ZIO XT patch monitor.  Your ZIO patch monitor will be mailed 3 day USPS to your address on file. It may take 3-5 days  to receive your monitor after you have been enrolled.  Once you have received your monitor, please review the enclosed instructions. Your monitor  has already been registered assigning a specific monitor serial # to you.  Billing and Patient Assistance Program  Information  We have supplied [employer] with any of your insurance information on file for billing purposes. [employer] offers a sliding scale Patient Assistance Program for patients that do not have  insurance, or whose insurance does not completely cover the cost of the ZIO monitor.  You must apply for the Patient Assistance Program to qualify for this discounted rate.  To apply, please call [employer] at [phone], select option 4, select option 2, ask to apply for  Patient Assistance Program. Theodore Demark will ask your household income, and how many people  are in your household. They will quote your out-of-pocket cost based on that information.  [employer] will also be able to set up a 90-month, interest-free payment plan if needed.  Applying the monitor   Shave hair from upper left chest.  Hold abrader disc by orange tab. Rub abrader in 40 strokes over the upper left chest as  indicated in your monitor instructions.  Clean area with 4 enclosed alcohol pads. Let dry.  Apply patch as indicated in monitor instructions. Patch will be placed under collarbone on left  side of chest with arrow pointing upward.  Rub patch adhesive wings for 2 minutes. Remove white label marked "1". Remove the white  label marked "2". Rub patch adhesive wings for 2 additional minutes.  While looking in a mirror, press and release button in center of patch. A small green light will  flash 3-4 times. This will be your only indicator that the monitor has been turned on.  Do not shower for the first 24 hours. You may shower after the first 24 hours.  Press  the button if you feel a symptom. You will hear a small click. Record Date, Time and  Symptom in the Patient Logbook.  When you are ready to remove the patch, follow instructions on the last 2 pages of Patient  Logbook. Stick patch monitor onto the last page of Patient Logbook.  Place Patient Logbook in the blue and white box. Use locking tab on box and tape box closed   securely. The blue and white box has prepaid postage on it. Please place it in the mailbox as  soon as possible. Your physician should have your test results approximately 7 days after the  monitor has been mailed back to [facility].  Call [place] at [phone] if you have questions regarding  your ZIO XT patch monitor. Call them immediately if you see an orange light blinking on your  monitor.  If your monitor falls off in less than 4 days, contact our Monitor department at [phone].  If your monitor becomes loose or falls off after 4 days call [employer] at [phone] for  suggestions on securing your monitor  Follow-Up: At [facility], you and your health needs are our priority.  As part of our continuing mission to provide you with exceptional heart care, we have created designated Provider Care Teams.  These Care Teams include your primary Cardiologist (physician) and Advanced Practice Providers (APPs -  Physician Assistants and Nurse Practitioners) who all work together to provide you with the care you need, when you need it.  We recommend signing up for the patient portal called "MyChart".  Sign up information is provided on this After Visit Summary.  MyChart is used to connect with patients for Virtual Visits (Telemedicine).  Patients are able to view lab/test results, encounter notes, upcoming appointments, etc.  Non-urgent messages can be sent to your provider as well.   To learn more about what you can do with MyChart, go to [email or web address].    Your next appointment:   Follow up will be based on the results of the above testing.

## 2022-04-19 ENCOUNTER — Encounter: Payer: Self-pay | Admitting: Neurology

## 2022-04-19 ENCOUNTER — Ambulatory Visit: Payer: Medicare Other | Admitting: Neurology

## 2022-04-19 ENCOUNTER — Other Ambulatory Visit (HOSPITAL_BASED_OUTPATIENT_CLINIC_OR_DEPARTMENT_OTHER): Payer: Self-pay

## 2022-04-19 VITALS — BP 120/68 | HR 63 | Ht 68.0 in | Wt 184.2 lb

## 2022-04-19 DIAGNOSIS — R42 Dizziness and giddiness: Secondary | ICD-10-CM | POA: Diagnosis not present

## 2022-04-19 DIAGNOSIS — E519 Thiamine deficiency, unspecified: Secondary | ICD-10-CM | POA: Diagnosis not present

## 2022-04-19 DIAGNOSIS — G609 Hereditary and idiopathic neuropathy, unspecified: Secondary | ICD-10-CM

## 2022-04-19 DIAGNOSIS — G603 Idiopathic progressive neuropathy: Secondary | ICD-10-CM

## 2022-04-19 DIAGNOSIS — I749 Embolism and thrombosis of unspecified artery: Secondary | ICD-10-CM

## 2022-04-19 DIAGNOSIS — H70002 Acute mastoiditis without complications, left ear: Secondary | ICD-10-CM

## 2022-04-19 DIAGNOSIS — R7989 Other specified abnormal findings of blood chemistry: Secondary | ICD-10-CM | POA: Diagnosis not present

## 2022-04-19 DIAGNOSIS — Z85118 Personal history of other malignant neoplasm of bronchus and lung: Secondary | ICD-10-CM

## 2022-04-19 DIAGNOSIS — D6859 Other primary thrombophilia: Secondary | ICD-10-CM | POA: Diagnosis not present

## 2022-04-19 DIAGNOSIS — G959 Disease of spinal cord, unspecified: Secondary | ICD-10-CM

## 2022-04-19 DIAGNOSIS — G459 Transient cerebral ischemic attack, unspecified: Secondary | ICD-10-CM | POA: Diagnosis not present

## 2022-04-19 DIAGNOSIS — C7951 Secondary malignant neoplasm of bone: Secondary | ICD-10-CM

## 2022-04-19 DIAGNOSIS — H34232 Retinal artery branch occlusion, left eye: Secondary | ICD-10-CM

## 2022-04-19 DIAGNOSIS — R7309 Other abnormal glucose: Secondary | ICD-10-CM | POA: Diagnosis not present

## 2022-04-19 DIAGNOSIS — R5383 Other fatigue: Secondary | ICD-10-CM

## 2022-04-19 DIAGNOSIS — R270 Ataxia, unspecified: Secondary | ICD-10-CM

## 2022-04-19 DIAGNOSIS — E531 Pyridoxine deficiency: Secondary | ICD-10-CM | POA: Diagnosis not present

## 2022-04-19 DIAGNOSIS — E538 Deficiency of other specified B group vitamins: Secondary | ICD-10-CM

## 2022-04-19 DIAGNOSIS — G8929 Other chronic pain: Secondary | ICD-10-CM

## 2022-04-19 DIAGNOSIS — R269 Unspecified abnormalities of gait and mobility: Secondary | ICD-10-CM

## 2022-04-19 DIAGNOSIS — M546 Pain in thoracic spine: Secondary | ICD-10-CM

## 2022-04-19 DIAGNOSIS — M542 Cervicalgia: Secondary | ICD-10-CM

## 2022-04-19 NOTE — Progress Notes (Addendum)
[facility]    Provider:  Dr [name] Page Requesting Provider: Marin Olp, MD Primary Care Provider:  Marin Olp, MD  CC: Follow-up after emergency room visit for vision changes likely BRA O  Addendum 04/26/2022 Dr. Jaynee Page: [name] Page, I am not worried about your elevated Thiamine or b12 (they are water soluble and you just pee out extra) but I don;t like elevated B6 because elevated B6 can cause toxicity and peripheral neuropathy. If you are taking extra B6 it I would stop it. Your tsh was very abnormal but your free t4 looked fine, are you on synthroid? Vitamin D is very low I would start taking a daily vitamin D OTC 1000-2000IU. Otherwise everything looks good so far except what we already discussed. Let me know if you have any questions I;ll send you a mychart message, still pending a few results thanks! [name] Page  - also he needs a loop recorder - also needs lupus anticoagulant repeated in 12 weeks  HPI:  [name] Page is a 72 y.o. male here as requested by [name] Olp, MD for BRAO.  He has a past medical history of vertigo as well and a large mastoid effusion and notes that we have seen him before in the past and imaging has been reassuring except for a large mastoid effusion for which she seen ENT.  This was completely different however.  This is a patient we have seen in the past, he has a past medical history of essential hypertension, venous stasis of both lower extremities, aortic atherosclerosis, GERD, IBS, idiopathic peripheral neuropathy, chronic mastoiditis, hypertriglyceridemia, history of lung cancer remote September 2015 Dr. Earlie Page, GAD, palpitations, chronic bilateral low back pain.  Patient is being sent today after an emergency room visit.  I reviewed emergency room notes: He presented with unilateral left eye visual field disturbance left lower quadrant near the nasal medial inferior region.  This happened on April 13, 2022.  He was also seen by  ophthalmology [name] Page the next morning and were trying to get those notes.  He was seen by emergency room and neurohospitalist [name] Page, ophthalmology saw him and saw a vitreous hemorrhage in the right eye, the episode was brief unilateral painless left sided visual loss that started at 4 PM on January 9, on arrival his vision had improved, he may have had only a slight visual field quadrant loss that is nasal bridge in the left eye and no associated visual field loss in the right eye.  He endorsed it was unilateral.  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  No other associated symptoms other than flashers and floaters.  [name] Page performed a bedside ultrasound which did show hyperechoic material within the vitreous suspected likely vitreous hemorrhage and possibly associated retinal detachment, she spoke with [name] Page who saw patient the next morning and will try to get those visit's notes prior to seeing him this morning.  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  Prior to this ED visit he was seen in September 2023 for ataxia and vertigo which has been chronic in the past with longstanding hearing loss but no new tinnitus.  He had a mild headache and reported similar symptoms in the past were attributed to mastoiditis at the time he had an  MRI of the brain MRI of the head and MRI of the neck to look for vertebrobasilar insufficiency it did show a chronic left mastoid effusion neurology did recommend a trial of prednisone as well as referral to ENT again as well as PCP follow-up.  There was not significant cervical carotid or vertebral artery stenosis.  He had also complained to Dr. Yong Page about ataxia and more stumbling tried to come off gabapentin later went back on. The vertigo improved on prednisone, and he had  seen multiple ENTs ([name] Page and [name] Page) in the past.  On further review of charts he has visited the emergency room multiple times in September at 1 point he had associated symptoms of palpitations with the dizziness.  At all times other than decrease in vision his neurologic exam has been nonfocal, his glucose has been slightly elevated, EKG has shown sinus rhythm with first-degree AV block with premature atrial complexes incomplete right bundle branch block when compared to prior years EKG.  Meclizine and scopolamine has been used in the past as well.  However at 1 point he was noted to be ataxic on exam.  I do not see where we have ever examined his neck.  In 2022 he was seen by vascular for lower extremity venous stasis.  It appears he has been on a daily aspirin for many years.  Ultrasound at that time demonstrated reflux at the common femoral vein and patella vein and recommendations included lower extremity compression as he does have mild deep venous reflux.  Few hours of left eye visual deficits 4 hours, acute onset, inferior and nasally, he saw ophthalmology and Dr. Manuella Page confirmed BRAO, recanalized just one quadrant of the left eye. Lasted 4 hours. He saw Dr. Manuella Page who confirmed, he could still see the area of the affected retina. Flashing lights. No headache. Acute onset. Definitely monocular. Right lights, floaters and a black spot in the lower nasal quadrant. He went to [facility] and it had resolved. No code stroke. [name] Page was involved. CTA H&N and MRi of the brain did not show stroke.  He also has worsening idiopathic neuropathy, now affectinghis hands, can't walk straight, difficulty with balance, wife provides much information.   Reviewed notes, labs and imaging from outside physicians, which showed:  MRI brain : 04/13/2022 Other: None.   IMPRESSION: 1. Normal brain MRI for age. No acute intracranial abnormality identified. 2. Moderate to large left mastoid effusion. Finding is of  uncertain significance. Correlation with physical exam suggested.  CTA H&N 1/9/024:  FINDINGS: CT HEAD FINDINGS   Brain: No evidence of acute large vascular territory infarction, hemorrhage, hydrocephalus, extra-axial collection or mass lesion/mass effect.   Vascular: Detailed below.   Skull: No acute fracture.   Sinuses/Orbits: No acute finding.   Other: Small left mastoid effusion.   Review of the MIP images confirms the above findings   CTA NECK FINDINGS   Aortic arch: Great vessel origins are patent without significant stenosis.   Right carotid system: No evidence of dissection, stenosis (50% or greater), or occlusion.   Left carotid system: No evidence of dissection, stenosis (50% or greater), or occlusion.   Vertebral arteries: Left dominant. No evidence of dissection, stenosis (50% or greater), or occlusion.   Skeleton: No acute abnormality.   Other neck: No acute abnormality.   Upper chest: Visualized lung apices are clear.   Review of the MIP images confirms the above findings   CTA HEAD FINDINGS   Anterior circulation: Bilateral intracranial ICAs MCAs,  and ACAs are patent without proximal hemodynamically significant stenosis.   Posterior circulation: Bilateral low intradural vertebral arteries, basilar artery, and bilateral posterior cerebral arteries are patent without proximal hemodynamically significant stenosis.   Venous sinuses: Not well evaluated due to arterial timing.   Review of the MIP images confirms the above findings   IMPRESSION: 1. No evidence of acute intracranial abnormality. 2. No large vessel occlusion or proximal hemodynamically significant stenosis  Review of Systems: Patient complains of symptoms per HPI as well as the following symptoms vision loss, gait abnormality. Pertinent negatives and positives per HPI. All others negative.   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 Married 30 + years. No children, lots of critters.       Cancer Survivor- carcinoid lung cancer.   [name] Page-Worked with Dr. 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 Martin Majestic back to work for ability to be insured.       Hobbies: music, home repair/improvement, cats   Social Determinants of Health   Financial Resource Strain: Low Risk  (06/19/2021)   Overall Financial Resource Strain (CARDIA)    Difficulty of Paying Living Expenses: Not hard at all  Food Insecurity: No Food Insecurity (06/19/2021)   Hunger Vital Sign    Worried About Running Out of Food in the Last Year: Never true    Ran Out of Food in the Last Year: Never true  Transportation Needs: No Transportation Needs (06/19/2021)   PRAPARE - Hydrologist (Medical): No    Lack of Transportation (Non-Medical): No  Physical Activity: Sufficiently Active (06/19/2021)   Exercise Vital Sign    Days of Exercise per Week: 6 days    Minutes of Exercise per Session: 50 min  Stress: No Stress Concern Present (06/19/2021)   [place]    Feeling of Stress : Not at all  Social Connections: Socially Isolated (06/19/2021)   Social Connection and Isolation Panel  [NHANES]    Frequency of Communication with Friends and Family: Once a week    Frequency of Social Gatherings with Friends and Family: Once a week    Attends Religious Services: Never    Marine scientist or Organizations: No    Attends Archivist Meetings: Never    Marital Status: Married  Human resources officer Violence: Not At Risk (06/19/2021)   Humiliation, Afraid, Rape, and Kick questionnaire    Fear of Current or Ex-Partner: No    Emotionally Abused: No    Physically Abused: No    Sexually Abused: No  Family History  Problem Relation Age of Onset   Hypertension Mother    Anxiety disorder Mother    Non-Hodgkin's lymphoma Mother    Dementia Father    Depression Father        and anxiety   Hypertension Father    Sudden death Paternal Uncle 60   Colon cancer Paternal Uncle    Hypertension Brother    Early death Neg Hx    Heart disease Neg Hx        mother in 80s had lesoin   Colon polyps Neg Hx    Esophageal cancer Neg Hx    Rectal cancer Neg Hx    Stomach cancer Neg Hx     Past Medical History:  Diagnosis Date   Allergic rhinitis, cause unspecified    MILD   Anxiety    Arthritis    OA   Cancer ([place])    lung tumor - NO CHEMO NO RADIATION   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   Cataract    forming   Depressive disorder, not elsewhere classified    Diverticulitis    X 1 EPISODE   Esophageal reflux    past hx    HTN (hypertension)    Hyperlipidemia    CONTROLLED   Lymphocytic colitis    Neuromuscular disorder (HCC)    neuropathy   Neuropathy    Other testicular hypofunction    Palpitations    Personal history of urinary calculi    LAST IN THE 80'S    Patient Active Problem List   Diagnosis Date Noted   Vitamin D deficiency 02/11/2022   Aortic atherosclerosis ([place]) 11/03/2020   Chronic bilateral low back pain 06/05/2019   Venous stasis of both lower extremities 06/05/2019   History of adenomatous polyp of colon  03/24/2018   Mastoiditis 03/09/2017   Chronic pain syndrome 08/31/2016   Palpitations 08/11/2016   Eustachian tube dysfunction 12/17/2014   Generalized anxiety disorder 03/12/2014   Essential hypertension 03/12/2014   SI (sacroiliac) joint dysfunction 09/19/2013   Nonallopathic lesion of sacral region 09/19/2013   Nonallopathic lesion of thoracic region 09/19/2013   Nonallopathic lesion of lumbosacral region 09/19/2013   Peripheral neuropathy, idiopathic 12/22/2012   History of lung cancer 12/11/2012   Dyspnea 07/14/2012   Nonspecific abnormal electrocardiogram (ECG) (EKG) 07/14/2012   Hypertriglyceridemia 07/14/2012   Allergic reaction, history of 10/25/2011   Rectal or anal pain 10/01/2010   Depression 02/16/2010   Low testosterone 11/21/2007   Allergic rhinitis 06/13/2007   GERD 06/13/2007   IBS (irritable bowel syndrome) 06/13/2007    Past Surgical History:  Procedure Laterality Date   COLONOSCOPY     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     Ascorbic Acid (VITAMIN C) 1000 MG tablet      aspirin EC (ASPIRIN ADULT LOW DOSE) 81 MG tablet Take 81 mg by mouth once.     atorvastatin (LIPITOR) 10 MG tablet Take 1 tablet (10 mg total) by mouth daily. 90 tablet 3   b complex vitamins tablet Take 1 tablet by mouth daily.     buPROPion (WELLBUTRIN XL) 300 MG 24 hr tablet TAKE 1 TABLET BY MOUTH  DAILY 90 tablet 3   Cholecalciferol (VITAMIN D) 2000 UNITS CAPS Take 1 capsule by mouth daily.     clonazePAM (KLONOPIN) 0.5 MG tablet Take 1 tablet (0.5 mg total) by mouth 2 (two) times daily as needed (do not drive for 12 hours after taking). for anxiety 180 tablet 1   diltiazem (CARDIZEM CD) 180 MG 24 hr capsule TAKE 1  CAPSULE BY MOUTH DAILY 100 capsule 2   gabapentin (NEURONTIN) 300 MG capsule TAKE 2 CAPSULES BY MOUTH AT  BEDTIME 200 capsule 2   hydrochlorothiazide (HYDRODIURIL) 25 MG tablet Take 1 tablet (25 mg total) by mouth daily. 90 tablet 3   HYDROcodone-acetaminophen (NORCO/VICODIN) 5-325 MG tablet Take 0.5-1 tablets by mouth 2 (two) times daily as needed. Do not use indicated instructions more than one to three times per week. 55 tablet 0   methylcellulose (CITRUCEL) oral powder Use as directed once daily     NEEDLE, DISP, 22 G 22G X 1" MISC Used to inject testosterone weekly 50 each 2   potassium chloride (KLOR-CON) 10 MEQ tablet TAKE 3 TABLETS BY MOUTH IN  THE MORNING AND 2 TABLETS  BY MOUTH IN THE AFTERNOON 500 tablet 2   Saw Palmetto, Serenoa repens, (SAW PALMETTO PO) Take 1 capsule by mouth 2 (two) times daily.     testosterone cypionate (DEPOTESTOSTERONE CYPIONATE) 200 MG/ML injection INJECT INTRAMUSCULARLY 0.3  ML WEEKLY (DISCARD UNUSED  AFTER FIRST USE) 3 mL 5   No current facility-administered medications for this visit.    Allergies as of 04/19/2022 - Review Complete 04/19/2022  Allergen Reaction Noted   Bee venom Anaphylaxis 11/18/2011   Flagyl [metronidazole hcl] Other (See Comments) 06/07/2011   Nsaids     Protonix [pantoprazole]  06/17/2020    Vitals: BP 120/68   Page 63   Ht 5\' 8"  (1.727 m)   Wt 184 lb 3.2 oz (83.6 kg)   BMI 28.01 kg/m  Last Weight:  Wt Readings from Last 1 Encounters:  04/19/22 184 lb 3.2 oz (83.6 kg)   Last Height:   Ht Readings from Last 1 Encounters:  04/19/22 5\' 8"  (1.727 m)   Physical exam: Exam: Gen: NAD, conversant, well nourised, obese, well groomed                     CV: RRR, no MRG. No Carotid Bruits. No peripheral edema, warm, nontender Eyes: Conjunctivae clear without exudates or hemorrhage  Neuro: Detailed Neurologic Exam  Speech:    Speech is normal; fluent and spontaneous with normal comprehension.  Cognition:    The patient  is oriented to person, place, and time;     recent and remote memory intact;     language fluent;     normal attention, concentration,     fund of knowledge Cranial Nerves:    The pupils are equal, round, and reactive to light. The fundi are normal and spontaneous venous pulsations are present. Visual fields are full to finger confrontation. Extraocular movements are intact. Trigeminal sensation is intact and the muscles of mastication are normal. The face is symmetric. The palate elevates in the midline. Hearing intact. Voice is normal. Shoulder shrug is normal. The tongue has normal motion without fasciculations.   Coordination: nml  Gait:   ataxic  Motor Observation:    No asymmetry, no atrophy, and no involuntary movements noted. Tone:    Normal muscle tone.    Posture:    Posture is normal. normal erect    Strength:  Strength is V/V in the upper and lower limbs.      Sensation: Can feel vibration and propr, dcereased pp to mid calf, cannot tandem, +rhomberg     Reflex Exam:  DTR's:    Lower absent, uppers 1+ Toes:    The toes are downgoing bilaterally.   Clonus:    Clonus is absent.     Assessment/Plan:  72 y.o. male here as requested by [name] Olp, MD for BRAO.  He has a past medical history of vertigo as well and a large mastoid effusion and notes that we have seen him before in the past and imaging has been reassuring except for a large mastoid effusion for which she seen ENT.  This was completely different however.  This is a patient we have seen in the past, he has a past medical history of essential hypertension, venous stasis of both lower extremities, aortic atherosclerosis, GERD, IBS, idiopathic peripheral neuropathy, chronic mastoiditis, hypertriglyceridemia, history of lung cancer remote September 2015 Dr. Earlie Page, GAD, palpitations, chronic bilateral low back pain.  Patient is being sent today after an emergency room visit.  I reviewed emergency room  notes: He presented with unilateral left eye visual field disturbance left lower quadrant near the nasal medial inferior region.  This happened on April 13, 2022.  He was also seen by ophthalmology [name] Page the next morning  Addendum: 06/21/2022: PAin out of proportion to mild CTS found on emg/ncs. 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 Requests Dr. Amedeo Plenty referral.   Significant mastoid effusion and vertigo:Refer to Dr. Benjamine Mola. He has had this for years and vertigo localized to the left ear with a "large left mastoid effusion". He has tried steroids, meclizine, scopolamine, happens frequently, been treated with antibiotics I recommend at this point mastoidectomy? Or ear tubes? He has tried flonease, antihistamines, tried the gamut and has multiple episodes of peripheral vertigo yearly necessitating ED visits. Severe mastoid effusion: Need to send to Dr. 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 Erlinda Hong Continue with the echo(with bubble), carotids, 2 week monitor ordered by primary care, please alert me to the results Loop and TEE if nothing on the 2 week monitor - please contact me with results above, I won;t knoe about them since I did not order Large serum evaluation again for peripheral neuropathy, consider genetic testing - labs today EMG/NCS on the upper extremities - will schedule, suspect CTS vs peripheral neuropathy Dr. Benjamine Mola for Vertigo and mastoid effusions - refer (see picture below) MRI cervical and thoracic spine for ataxia in a patient with prior lumg cancer Blood work today, extensive He had come off a baby aspirin, 81mg  restart  Addendum 04/26/2022 Dr. Jaynee Page: [name] Page, I am not worried about your elevated Thiamine or b12 (they are water soluble and you just pee out  extra) but I don;t like elevated B6 because elevated B6 can cause toxicity and peripheral neuropathy. If you are taking extra B6 it I would stop it. Your tsh was very abnormal but your free t4 looked fine, are you on synthroid? Vitamin D is very low I would start taking a daily vitamin D OTC 1000-2000IU. Otherwise everything looks good so far except what we already discussed. Let me know if you  have any questions I;ll send you a mychart message, still pending a few results thanks! [name] Page  - also he needs a loop recorder - also needs lupus anticoagulant repeated in 12 weeks  Orders Placed This Encounter  Procedures   MR CERVICAL SPINE W WO CONTRAST   MR THORACIC SPINE W WO CONTRAST   Hemoglobin A1c   Vitamin D, 25-hydroxy   Cardiolipin antibodies, IgG, IgM, IgA   Prothrombin gene mutation   Factor 5 leiden   Homocysteine   Beta-2-glycoprotein i abs, IgG/M/A   Lupus anticoagulant   Protein C activity   Protein C, total   Protein S activity   Protein S, total   Antithrombin III   B12 and Folate Panel   Vitamin B1   Vitamin B6   ANCA Profile   ANA, IFA (with reflex)   ANA Comprehensive Panel   TSH Rfx on Abnormal to Free T4   Methylmalonic acid, serum   Angiotensin converting enzyme   Sjogren's syndrome antibods(ssa + ssb)   Hepatitis C antibody   Tissue transglutaminase, IgA   Gliadin antibodies, serum   Rheumatoid factor   Heavy metals, blood   Vitamin E   Multiple Myeloma Panel (SPEP&IFE w/QIG)   Copper, serum   GeneSeq PLUS, TTR   Ambulatory referral to ENT   NCV with EMG(electromyography)   VAS [place] TRANSCRANIAL DOPPLER W BUBBLES   No orders of the defined types were placed in this encounter.   Cc: [name] Olp, MD,  [name] Olp, MD  Sarina Ill, MD  [facility] [address] [place], [place] [address]  Phone [phone] Fax [phone]  I spent over 140 minutes of face-to-face and non-face-to-face time with  patient on the  1. 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 TIA (transient ischemic attack)   3. Elevated glucose   4. Low vitamin D level   5. Hypercoagulable state ([place])   6. Other fatigue   7. B12 deficiency   8. Vitamin B1 deficiency   9. Vitamin B6 deficiency   10. Idiopathic progressive neuropathy   11. Hereditary and idiopathic neuropathy   12. Vertigo   13. Mastoid abscess, left   14. Embolism ([place])   15. Hx of cancer of lung   16. Ataxia   17. Myelopathy ([place])   18. Gait abnormality   19. screen for Malignant neoplasm metastatic to bone ([place])   20. Chronic neck pain   21. Chronic midline thoracic back pain    diagnosis.  This included previsit chart review, lab review, study review, order entry, electronic health record documentation, patient education on the different diagnostic and therapeutic options, counseling and coordination of care, risks and benefits of management, compliance, or risk factor reduction

## 2022-04-19 NOTE — Patient Instructions (Addendum)
TCD for PFO - talk to xu Continue with the echo(with bubble), carotids, 2 week monitor - tell me results Loop and TEE if nothing on the 2 week monitor - contact Whole serum evaluation again for peripheral neuropathy, consider genetic testing - labs today EMG/NCS on the upper extremities - will schedule Dr. Suszanne Conners for Vertigo and mastoid effusions - refer MRI cervical spine for ataxia- call and thoracic  Blood work  Orders Placed This Encounter  Procedures   Hemoglobin A1c   Vitamin D, 25-hydroxy   Cardiolipin antibodies, IgG, IgM, IgA   Prothrombin gene mutation   Factor 5 leiden   Homocysteine   Beta-2-glycoprotein i abs, IgG/M/A   Lupus anticoagulant   Protein C activity   Protein C, total   Protein S activity   Protein S, total   Antithrombin III   B12 and Folate Panel   Vitamin B1   Vitamin B6   ANCA Profile   ANA, IFA (with reflex)   ANA Comprehensive Panel   TSH Rfx on Abnormal to Free T4   Methylmalonic acid, serum   Angiotensin converting enzyme   Sjogren's syndrome antibods(ssa + ssb)   Hepatitis C antibody   Tissue transglutaminase, IgA   Gliadin antibodies, serum   Rheumatoid factor   Heavy metals, blood   Vitamin E   Multiple Myeloma Panel (SPEP&IFE w/QIG)   Copper, serum   GeneSeq PLUS, TTR   NCV with EMG(electromyography)     Peripheral Neuropathy Peripheral neuropathy is a type of nerve damage. It affects nerves that carry signals between the spinal cord and the arms, legs, and the rest of the body (peripheral nerves). It does not affect nerves in the spinal cord or brain. In peripheral neuropathy, one nerve or a group of nerves may be damaged. Peripheral neuropathy is a broad category that includes many specific nerve disorders, like diabetic neuropathy, hereditary neuropathy, and carpal tunnel syndrome. What are the causes? This condition may be caused by: Certain diseases, such as: Diabetes. This is the most common cause of peripheral  neuropathy. Autoimmune diseases, such as rheumatoid arthritis and systemic lupus erythematosus. Nerve diseases that are passed from parent to child (inherited). Kidney disease. Thyroid disease. Other causes may include: Nerve injury. Pressure or stress on a nerve that lasts a long time. Lack (deficiency) of B vitamins. This can result from alcoholism, poor diet, or a restricted diet. Infections. Some medicines, such as cancer medicines (chemotherapy). Poisonous (toxic) substances, such as lead and mercury. Too little blood flowing to the legs. In some cases, the cause of this condition is not known. What are the signs or symptoms? Symptoms of this condition depend on which of your nerves is damaged. Symptoms in the legs, hands, and arms can include: Loss of feeling (numbness) in the feet, hands, or both. Tingling in the feet, hands, or both. Burning pain. Very sensitive skin. Weakness. Not being able to move a part of the body (paralysis). Clumsiness or poor coordination. Muscle twitching. Loss of balance. Symptoms in other parts of the body can include: Not being able to control your bladder. Feeling dizzy. Sexual problems. How is this diagnosed? Diagnosing and finding the cause of peripheral neuropathy can be difficult. Your health care provider will take your medical history and do a physical exam. A neurological exam will also be done. This involves checking things that are affected by your brain, spinal cord, and nerves (nervous system). For example, your health care provider will check your reflexes, how you move, and  what you can feel. You may have other tests, such as: Blood tests. Electromyogram (EMG) and nerve conduction tests. These tests check nerve function and how well the nerves are controlling the muscles. Imaging tests, such as a CT scan or MRI, to rule out other causes of your symptoms. Removing a small piece of nerve to be examined in a lab (nerve  biopsy). Removing and examining a small amount of the fluid that surrounds the brain and spinal cord (lumbar puncture). How is this treated? Treatment for this condition may involve: Treating the underlying cause of the neuropathy, such as diabetes, kidney disease, or vitamin deficiencies. Stopping medicines that can cause neuropathy, such as chemotherapy. Medicine to help relieve pain. Medicines may include: Prescription or over-the-counter pain medicine. Anti-seizure medicine. Antidepressants. Pain-relieving patches that are applied to painful areas of skin. Surgery to relieve pressure on a nerve or to destroy a nerve that is causing pain. Physical therapy to help improve movement and balance. Devices to help you move around (assistive devices). Follow these instructions at home: Medicines Take over-the-counter and prescription medicines only as told by your health care provider. Do not take any other medicines without first asking your health care provider. Ask your health care provider if the medicine prescribed to you requires you to avoid driving or using machinery. Lifestyle  Do not use any products that contain nicotine or tobacco. These products include cigarettes, chewing tobacco, and vaping devices, such as e-cigarettes. Smoking keeps blood from reaching damaged nerves. If you need help quitting, ask your health care provider. Avoid or limit alcohol. Too much alcohol can cause a vitamin B deficiency, and vitamin B is needed for healthy nerves. Eat a healthy diet. This includes: Eating foods that are high in fiber, such as beans, whole grains, and fresh fruits and vegetables. Limiting foods that are high in fat and processed sugars, such as fried or sweet foods. General instructions  If you have diabetes, work closely with your health care provider to keep your blood sugar under control. If you have numbness in your feet: Check every day for signs of injury or infection. Watch  for redness, warmth, and swelling. Wear padded socks and comfortable shoes. These help protect your feet. Develop a good support system. Living with peripheral neuropathy can be stressful. Consider talking with a mental health specialist or joining a support group. Use assistive devices and attend physical therapy as told by your health care provider. This may include using a walker or a cane. Keep all follow-up visits. This is important. Where to find more information [employer] of Neurological Disorders: [employer] Contact a health care provider if: You have new signs or symptoms of peripheral neuropathy. You are struggling emotionally from dealing with peripheral neuropathy. Your pain is not well controlled. Get help right away if: You have an injury or infection that is not healing normally. You develop new weakness in an arm or leg. You have fallen or do so frequently. Summary Peripheral neuropathy is when the nerves in the arms or legs are damaged, resulting in numbness, weakness, or pain. There are many causes of peripheral neuropathy, including diabetes, pinched nerves, vitamin deficiencies, autoimmune disease, and hereditary conditions. Diagnosing and finding the cause of peripheral neuropathy can be difficult. Your health care provider will take your medical history, do a physical exam, and do tests, including blood tests and nerve function tests. Treatment involves treating the underlying cause of the neuropathy and taking medicines to help control pain. Physical therapy and  assistive devices may also help. This information is not intended to replace advice given to you by your health care provider. Make sure you discuss any questions you have with your health care provider. Document Revised: 11/25/2020 Document Reviewed: 11/25/2020 Elsevier Patient Education  2023 [employer].

## 2022-04-20 ENCOUNTER — Telehealth: Payer: Self-pay | Admitting: Neurology

## 2022-04-20 ENCOUNTER — Other Ambulatory Visit (HOSPITAL_BASED_OUTPATIENT_CLINIC_OR_DEPARTMENT_OTHER): Payer: Self-pay

## 2022-04-20 NOTE — Telephone Encounter (Signed)
ENT referral sent to Dr. Avel Sensor office, phone # [phone].

## 2022-04-20 NOTE — Telephone Encounter (Signed)
[employer] medicare NPR sent to [phone]

## 2022-04-21 ENCOUNTER — Ambulatory Visit (HOSPITAL_COMMUNITY)
Admission: RE | Admit: 2022-04-21 | Discharge: 2022-04-21 | Disposition: A | Discharge status: Home / Self Care | Payer: Medicare Other | Source: Ambulatory Visit | Attending: Cardiology | Admitting: Cardiology

## 2022-04-21 DIAGNOSIS — H34232 Retinal artery branch occlusion, left eye: Secondary | ICD-10-CM | POA: Diagnosis not present

## 2022-04-21 DIAGNOSIS — H34239 Retinal artery branch occlusion, unspecified eye: Secondary | ICD-10-CM | POA: Insufficient documentation

## 2022-04-21 DIAGNOSIS — G459 Transient cerebral ischemic attack, unspecified: Secondary | ICD-10-CM | POA: Diagnosis not present

## 2022-04-22 ENCOUNTER — Other Ambulatory Visit: Payer: Self-pay

## 2022-04-22 ENCOUNTER — Other Ambulatory Visit (HOSPITAL_BASED_OUTPATIENT_CLINIC_OR_DEPARTMENT_OTHER): Payer: Self-pay

## 2022-04-22 ENCOUNTER — Encounter: Payer: Self-pay | Admitting: Family Medicine

## 2022-04-22 MED ORDER — ROSUVASTATIN CALCIUM 5 MG PO TABS
5.0000 mg | ORAL_TABLET | Freq: Every day | ORAL | 3 refills | Status: DC
  Filled 2022-04-22: qty 90, 90d supply, fill #0
  Filled 2022-08-04: qty 90, 90d supply, fill #1
  Filled 2022-11-11: qty 90, 90d supply, fill #2
  Filled 2023-02-10: qty 90, 90d supply, fill #3

## 2022-04-23 ENCOUNTER — Ambulatory Visit (HOSPITAL_COMMUNITY)
Admission: RE | Admit: 2022-04-23 | Discharge: 2022-04-23 | Disposition: A | Discharge status: Home / Self Care | Payer: Medicare Other | Source: Ambulatory Visit | Attending: Neurology | Admitting: Neurology

## 2022-04-23 DIAGNOSIS — I749 Embolism and thrombosis of unspecified artery: Secondary | ICD-10-CM | POA: Diagnosis not present

## 2022-04-23 DIAGNOSIS — G459 Transient cerebral ischemic attack, unspecified: Secondary | ICD-10-CM | POA: Diagnosis not present

## 2022-04-23 DIAGNOSIS — H34232 Retinal artery branch occlusion, left eye: Secondary | ICD-10-CM

## 2022-04-23 NOTE — Progress Notes (Signed)
TCD bubble study has been completed.   Preliminary results in CV Proc.   Jeilyn Reznik Evadean Sproule 04/23/2022 1:25 PM

## 2022-04-26 ENCOUNTER — Telehealth: Payer: Self-pay

## 2022-04-26 ENCOUNTER — Encounter: Payer: Self-pay | Admitting: Neurology

## 2022-04-26 NOTE — Telephone Encounter (Signed)
     Patient  visit on 1/10  at [facility]   Have you been able to follow up with your primary care physician? Yes  Patient has had all medications filled   Are there diet recommendations that you are having difficulty following? Na   Patient expresses understanding of discharge instructions and education provided has no other needs at this time.  Yes      Lenard Forth [facility], [facility], Care Management  [phone] [address] [place], [place], [place] [address] Phone: [phone] Email: [email or web address]

## 2022-04-27 ENCOUNTER — Encounter: Payer: Self-pay | Admitting: Endocrinology

## 2022-04-28 LAB — GENESEQ PLUS, TTR

## 2022-04-29 ENCOUNTER — Other Ambulatory Visit: Payer: Self-pay | Admitting: Endocrinology

## 2022-04-29 ENCOUNTER — Other Ambulatory Visit: Payer: Self-pay | Admitting: Neurology

## 2022-04-29 ENCOUNTER — Telehealth: Payer: Self-pay | Admitting: Endocrinology

## 2022-04-29 ENCOUNTER — Telehealth: Payer: Self-pay | Admitting: Cardiology

## 2022-04-29 DIAGNOSIS — R7989 Other specified abnormal findings of blood chemistry: Secondary | ICD-10-CM

## 2022-04-29 DIAGNOSIS — I6310 Cerebral infarction due to embolism of unspecified precerebral artery: Secondary | ICD-10-CM

## 2022-04-29 NOTE — Telephone Encounter (Signed)
[name] Page with [employer] called in requesting a CPT code for 3/05 ILR implant with Dr. Quentin Ore. She states they do not have a direct call back number and the call will need to be returned to the patient with this information.

## 2022-04-29 NOTE — Telephone Encounter (Signed)
Dr Dwyane Dee patient has scheduled an appointment to follow up on his elevated Thyroid labs from the ED - he will be doing labs at [facility] and will need labs put in for him.

## 2022-04-30 ENCOUNTER — Other Ambulatory Visit (INDEPENDENT_AMBULATORY_CARE_PROVIDER_SITE_OTHER): Payer: Medicare Other

## 2022-04-30 DIAGNOSIS — R7989 Other specified abnormal findings of blood chemistry: Secondary | ICD-10-CM | POA: Diagnosis not present

## 2022-04-30 LAB — TSH: TSH: 2.63 u[IU]/mL (ref 0.35–5.50)

## 2022-04-30 LAB — T4, FREE: Free T4: 0.7 ng/dL (ref 0.60–1.60)

## 2022-05-01 LAB — THYROID PEROXIDASE ANTIBODY: Thyroperoxidase Ab SerPl-aCnc: <9 IU/mL (ref 0–34)

## 2022-05-03 ENCOUNTER — Telehealth: Payer: Self-pay | Admitting: Neurology

## 2022-05-03 ENCOUNTER — Ambulatory Visit: Payer: Medicare Other | Admitting: Endocrinology

## 2022-05-03 NOTE — Telephone Encounter (Signed)
Referral for Cardiology sent through [facility] to [facility]. Phone: [phone]

## 2022-05-04 LAB — MULTIPLE MYELOMA PANEL, SERUM
Albumin SerPl Elph-Mcnc: 4.5 g/dL — ABNORMAL HIGH (ref 2.9–4.4)
Albumin/Glob SerPl: 1.6 (ref 0.7–1.7)
Alpha 1: 0.3 g/dL (ref 0.0–0.4)
Alpha2 Glob SerPl Elph-Mcnc: 0.7 g/dL (ref 0.4–1.0)
B-Globulin SerPl Elph-Mcnc: 1 g/dL (ref 0.7–1.3)
Gamma Glob SerPl Elph-Mcnc: 1 g/dL (ref 0.4–1.8)
Globulin, Total: 3 g/dL (ref 2.2–3.9)
IgA/Immunoglobulin A, Serum: 83 mg/dL (ref 61–437)
IgG (Immunoglobin G), Serum: 1002 mg/dL (ref 603–1613)
IgM (Immunoglobulin M), Srm: 78 mg/dL (ref 15–143)
Total Protein: 7.5 g/dL (ref 6.0–8.5)

## 2022-05-04 LAB — VITAMIN E
Vitamin E (Alpha Tocopherol): 12.1 mg/L (ref 9.0–29.0)
Vitamin E(Gamma Tocopherol): 1.6 mg/L (ref 0.5–4.9)

## 2022-05-04 LAB — PROTEIN S ACTIVITY: Protein S Activity: 101 % (ref 63–140)

## 2022-05-04 LAB — GLIADIN ANTIBODIES, SERUM
Antigliadin Abs, IgA: 4 units (ref 0–19)
Gliadin IgG: 2 units (ref 0–19)

## 2022-05-04 LAB — HEAVY METALS, BLOOD
Arsenic: 2 ug/L (ref 0–9)
Lead, Blood: <1 ug/dL (ref 0.0–3.4)
Mercury: <1 ug/L (ref 0.0–14.9)

## 2022-05-04 LAB — ANA COMPREHENSIVE PANEL
Anti JO-1: <0.2 AI (ref 0.0–0.9)
Centromere Ab Screen: <0.2 AI (ref 0.0–0.9)
Chromatin Ab SerPl-aCnc: <0.2 AI (ref 0.0–0.9)
ENA RNP Ab: <0.2 AI (ref 0.0–0.9)
ENA SM Ab Ser-aCnc: <0.2 AI (ref 0.0–0.9)
ENA SSA (RO) Ab: 0.2 AI (ref 0.0–0.9)
ENA SSB (LA) Ab: <0.2 AI (ref 0.0–0.9)
Scleroderma (Scl-70) (ENA) Antibody, IgG: <0.2 AI (ref 0.0–0.9)
dsDNA Ab: <1 IU/mL (ref 0–9)

## 2022-05-04 LAB — HEPATITIS C ANTIBODY: Hep C Virus Ab: NONREACTIVE

## 2022-05-04 LAB — GENESEQ PLUS, TTR

## 2022-05-04 LAB — ANCA PROFILE
Anti-MPO Antibodies: <0.2 units (ref 0.0–0.9)
Anti-PR3 Antibodies: <0.2 units (ref 0.0–0.9)
Atypical pANCA: <1:20 {titer}
C-ANCA: <1:20 {titer}
P-ANCA: <1:20 {titer}

## 2022-05-04 LAB — HEMOGLOBIN A1C
Est. average glucose Bld gHb Est-mCnc: 114 mg/dL
Hgb A1c MFr Bld: 5.6 % (ref 4.8–5.6)

## 2022-05-04 LAB — VITAMIN B6: Vitamin B6: 149 ug/L — ABNORMAL HIGH (ref 3.4–65.2)

## 2022-05-04 LAB — TSH RFX ON ABNORMAL TO FREE T4: TSH: 7.08 u[IU]/mL — ABNORMAL HIGH (ref 0.450–4.500)

## 2022-05-04 LAB — B12 AND FOLATE PANEL
Folate: 4.7 ng/mL (ref >3.0)
Vitamin B-12: 676 pg/mL (ref 232–1245)

## 2022-05-04 LAB — T4F: T4,Free (Direct): 1.1 ng/dL (ref 0.82–1.77)

## 2022-05-04 LAB — ANGIOTENSIN CONVERTING ENZYME: Angio Convert Enzyme: 77 U/L (ref 14–82)

## 2022-05-04 LAB — PROTEIN S, TOTAL: Protein S Ag, Total: 104 % (ref 60–150)

## 2022-05-04 LAB — COPPER, SERUM: Copper: 116 ug/dL (ref 69–132)

## 2022-05-04 LAB — BETA-2-GLYCOPROTEIN I ABS, IGG/M/A
Beta-2 Glyco 1 IgA: <9 GPI IgA units (ref 0–25)
Beta-2 Glyco 1 IgM: 31 GPI IgM units (ref 0–32)
Beta-2 Glyco I IgG: <9 GPI IgG units (ref 0–20)

## 2022-05-04 LAB — VITAMIN D 25 HYDROXY (VIT D DEFICIENCY, FRACTURES): Vit D, 25-Hydroxy: 29.1 ng/mL — ABNORMAL LOW (ref 30.0–100.0)

## 2022-05-04 LAB — RHEUMATOID FACTOR: Rheumatoid fact SerPl-aCnc: <10 IU/mL (ref <14.0)

## 2022-05-04 LAB — PROTEIN C ACTIVITY: Protein C Activity: 128 % (ref 73–180)

## 2022-05-04 LAB — ANTITHROMBIN III: AntiThromb III Func: 115 % (ref 75–135)

## 2022-05-04 LAB — LUPUS ANTICOAGULANT
Dilute Viper Venom Time: 42.4 s (ref 0.0–47.0)
PTT Lupus Anticoagulant: 36.4 s (ref 0.0–43.5)
Thrombin Time: 18.2 s (ref 0.0–23.0)
dPT Confirm Ratio: 1.35 Ratio — ABNORMAL HIGH (ref 0.00–1.34)
dPT: 47.5 s (ref 0.0–47.6)

## 2022-05-04 LAB — FACTOR 5 LEIDEN

## 2022-05-04 LAB — METHYLMALONIC ACID, SERUM: Methylmalonic Acid: 148 nmol/L (ref 0–378)

## 2022-05-04 LAB — PROTHROMBIN GENE MUTATION

## 2022-05-04 LAB — ANTINUCLEAR ANTIBODIES, IFA: ANA Titer 1: NEGATIVE

## 2022-05-04 LAB — HOMOCYSTEINE: Homocysteine: 11.6 umol/L (ref 0.0–19.2)

## 2022-05-04 LAB — PROTEIN C, TOTAL: Protein C Antigen: 144 % (ref 60–150)

## 2022-05-04 LAB — VITAMIN B1: Thiamine: 240.4 nmol/L — ABNORMAL HIGH (ref 66.5–200.0)

## 2022-05-04 LAB — TISSUE TRANSGLUTAMINASE, IGA: Transglutaminase IgA: <2 U/mL (ref 0–3)

## 2022-05-05 ENCOUNTER — Ambulatory Visit (HOSPITAL_COMMUNITY): Payer: Medicare Other | Attending: Cardiology

## 2022-05-05 DIAGNOSIS — G459 Transient cerebral ischemic attack, unspecified: Secondary | ICD-10-CM | POA: Diagnosis not present

## 2022-05-05 DIAGNOSIS — H34239 Retinal artery branch occlusion, unspecified eye: Secondary | ICD-10-CM | POA: Diagnosis not present

## 2022-05-06 LAB — ECHOCARDIOGRAM COMPLETE
Area-P 1/2: 3.42 cm2
S' Lateral: 2.1 cm

## 2022-05-09 ENCOUNTER — Ambulatory Visit
Admission: RE | Admit: 2022-05-09 | Discharge: 2022-05-09 | Disposition: A | Discharge status: Home / Self Care | Payer: Medicare Other | Source: Ambulatory Visit | Attending: Neurology | Admitting: Neurology

## 2022-05-09 DIAGNOSIS — C7951 Secondary malignant neoplasm of bone: Secondary | ICD-10-CM

## 2022-05-09 DIAGNOSIS — G959 Disease of spinal cord, unspecified: Secondary | ICD-10-CM

## 2022-05-09 DIAGNOSIS — R202 Paresthesia of skin: Secondary | ICD-10-CM | POA: Diagnosis not present

## 2022-05-09 DIAGNOSIS — Z85118 Personal history of other malignant neoplasm of bronchus and lung: Secondary | ICD-10-CM

## 2022-05-09 DIAGNOSIS — R269 Unspecified abnormalities of gait and mobility: Secondary | ICD-10-CM

## 2022-05-09 DIAGNOSIS — R270 Ataxia, unspecified: Secondary | ICD-10-CM

## 2022-05-09 DIAGNOSIS — G8929 Other chronic pain: Secondary | ICD-10-CM

## 2022-05-09 DIAGNOSIS — M5134 Other intervertebral disc degeneration, thoracic region: Secondary | ICD-10-CM | POA: Diagnosis not present

## 2022-05-09 DIAGNOSIS — M50223 Other cervical disc displacement at C6-C7 level: Secondary | ICD-10-CM | POA: Diagnosis not present

## 2022-05-09 DIAGNOSIS — M542 Cervicalgia: Secondary | ICD-10-CM | POA: Diagnosis not present

## 2022-05-09 MED ORDER — GADOPICLENOL 0.5 MMOL/ML IV SOLN
8.0000 mL | Freq: Once | INTRAVENOUS | Status: AC | PRN
  Administered 2022-05-09: 8 mL via INTRAVENOUS

## 2022-05-11 ENCOUNTER — Encounter: Payer: Self-pay | Admitting: Neurology

## 2022-05-11 ENCOUNTER — Other Ambulatory Visit (HOSPITAL_BASED_OUTPATIENT_CLINIC_OR_DEPARTMENT_OTHER): Payer: Self-pay

## 2022-05-11 ENCOUNTER — Other Ambulatory Visit: Payer: Self-pay | Admitting: Family Medicine

## 2022-05-11 MED ORDER — HYDROCODONE-ACETAMINOPHEN 5-325 MG PO TABS
0.5000 | ORAL_TABLET | Freq: Two times a day (BID) | ORAL | 0 refills | Status: DC | PRN
  Filled 2022-05-11: qty 55, 28d supply, fill #0

## 2022-05-11 MED ORDER — CLONAZEPAM 0.5 MG PO TABS
0.5000 mg | ORAL_TABLET | Freq: Two times a day (BID) | ORAL | 1 refills | Status: DC | PRN
  Filled 2022-05-11: qty 180, 90d supply, fill #0
  Filled 2022-08-04 – 2022-08-07 (×3): qty 180, 90d supply, fill #1

## 2022-05-12 DIAGNOSIS — G459 Transient cerebral ischemic attack, unspecified: Secondary | ICD-10-CM | POA: Diagnosis not present

## 2022-05-12 DIAGNOSIS — H34239 Retinal artery branch occlusion, unspecified eye: Secondary | ICD-10-CM | POA: Diagnosis not present

## 2022-05-14 DIAGNOSIS — H34232 Retinal artery branch occlusion, left eye: Secondary | ICD-10-CM | POA: Diagnosis not present

## 2022-05-14 DIAGNOSIS — H25813 Combined forms of age-related cataract, bilateral: Secondary | ICD-10-CM | POA: Diagnosis not present

## 2022-05-20 ENCOUNTER — Ambulatory Visit: Payer: Medicare Other | Admitting: Neurology

## 2022-05-20 VITALS — BP 133/74 | HR 60 | Ht 68.0 in | Wt 184.0 lb

## 2022-05-20 DIAGNOSIS — R760 Raised antibody titer: Secondary | ICD-10-CM

## 2022-05-20 DIAGNOSIS — E678 Other specified hyperalimentation: Secondary | ICD-10-CM

## 2022-05-20 DIAGNOSIS — E559 Vitamin D deficiency, unspecified: Secondary | ICD-10-CM

## 2022-05-20 DIAGNOSIS — G5603 Carpal tunnel syndrome, bilateral upper limbs: Secondary | ICD-10-CM | POA: Diagnosis not present

## 2022-05-20 DIAGNOSIS — E5111 Dry beriberi: Secondary | ICD-10-CM

## 2022-05-20 NOTE — Patient Instructions (Signed)

## 2022-05-20 NOTE — Progress Notes (Deleted)
B1,b6,vitd,lupis anticoag [facility] at [place]

## 2022-05-21 ENCOUNTER — Encounter: Payer: Self-pay | Admitting: Cardiology

## 2022-05-23 NOTE — Procedures (Signed)
Full Name: [name] Page Gender: Male MRN #: [id number] Date of Birth: Sep 11, 1950    Visit Date: 05/20/2022 15:12 Age: 72 Years Examining Physician: Dr. Sarina Ill Referring Physician: Dr. Sarina Ill Height: 5 feet 8 inch  History: Thumb and first finger pain and weakness. 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   Summary:  Nerve Conduction Studies and EMG needle study were performed on the bilateral upper extremities.  The right Median 2nd Digit antidromic sensory nerve showed prolonged distal peak latency (4.1 ms, N<3.5) and reduced amplitude(7 uV, N>20) The right Median 2nd Digit orthodromic sensory nerve showed prolonged distal peak latency (3.4 ms, N<3.4) and reduced amplitude(9 uV, N>10) The left Median 2nd Digit orthodromic sensory nerve showed reduced amplitude(9 uV, N>10)  The right median/ulnar (palm) comparison nerve showed prolonged distal peak latency (Median Palm, 2.0 ms, N<2.2) and abnormal peak latency difference (Median Palm-Ulnar Palm, 0.6 ms, N<0.4) with a relative median delay.    The left median/ulnar (palm) comparison nerve showed prolonged distal peak latency (Median Palm, 2.4 ms, N<2.2) and abnormal peak latency difference (Median Palm-Ulnar Palm, 0.5 ms, N<0.4) with a relative median delay.    All remaining nerves (as indicated in the following tables) were within normal limits.   All remaining muscles (as indicated in the following tables) were within normal limits.     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  No suggestion of polyneuropathy or radiculopathy.    ------------------------------- Sarina Ill, M.D.  [facility] [address] [place], [place] [address] Tel: [phone] Fax: [phone]  Verbal informed consent was obtained from the patient, patient was informed of potential risk of procedure, including bruising, bleeding, hematoma formation, infection, muscle  weakness, muscle pain, numbness, among others.        [place]    Nerve / Sites Muscle Latency Ref. Amplitude Ref. Rel Amp Segments Distance Velocity Ref. Area    ms ms mV mV %  cm m/s m/s mVms  R Median - APB     Wrist APB 3.7 ?4.4 9.0 ?4.0 100 Wrist - APB 7   28.6     Upper arm APB 8.6  9.3  104 Upper arm - Wrist 24 49 ?49 32.1  L Median - APB     Wrist APB 4.2 ?4.4 6.2 ?4.0 100 Wrist - APB 7   20.3     Upper arm APB 9.0  5.6  89.7 Upper arm - Wrist 23.5 49 ?49 19.9  R Ulnar - ADM     Wrist ADM 3.1 ?3.3 9.4 ?6.0 100 Wrist - ADM 7   25.6     B.Elbow ADM 5.6  8.7  92 B.Elbow - Wrist 14 55 ?49 27.7     A.Elbow ADM 8.8  8.2  94.5 A.Elbow - B.Elbow 17 53 ?49 28.1  L Ulnar - ADM     Wrist ADM 3.2 ?3.3 9.6 ?6.0 100 Wrist - ADM 7   31.0     B.Elbow ADM 5.7  8.8  91.8 B.Elbow - Wrist 12 48 ?49 30.3     A.Elbow ADM 8.7  8.7  98 A.Elbow - B.Elbow 17 57 ?49 30.7             [facility]    Nerve / Sites Rec. Site Peak Lat Ref.  Amp Ref. Segments Distance Peak Diff Ref.    ms ms V V  cm ms ms  R Median - Digit II (Antidromic)  Wrist Dig II 4.1 ?3.5 7 ?20 Wrist - Dig II 13    R Radial - Anatomical snuff box (Forearm)     Forearm Wrist 2.6 ?2.9 18 ?15 Forearm - Wrist 10    R Median, Ulnar - Transcarpal comparison     Median Palm Wrist 2.0 ?2.2 7 ?35 Median Palm - Wrist 8       Ulnar Palm Wrist 1.4 ?2.2 12 ?12 Ulnar Palm - Wrist 8          Median Palm - Ulnar Palm  0.6 ?0.4  L Median, Ulnar - Transcarpal comparison     Median Palm Wrist 2.4 ?2.2 9 ?35 Median Palm - Wrist 8       Ulnar Palm Wrist 2.0 ?2.2 4 ?12 Ulnar Palm - Wrist 8          Median Palm - Ulnar Palm  0.5 ?0.4  R Median - Orthodromic (Dig II, Mid palm)     Dig II Wrist 3.4 ?3.4 9 ?10 Dig II - Wrist 13    L Median - Orthodromic (Dig II, Mid palm)     Dig II Wrist 2.9 ?3.4 9 ?10 Dig II - Wrist 13    R Ulnar - Orthodromic, (Dig V, Mid palm)     Dig V Wrist 3.1 ?3.1 5 ?5 Dig V - Wrist 11    L Ulnar - Orthodromic, (Dig V, Mid palm)      Dig V Wrist 2.4 ?3.1 5 ?5 Dig V - Wrist 75                       F  Wave    Nerve F Lat Ref.   ms ms  R Ulnar - ADM 31.1 ?32.0  L Ulnar - ADM 30.4 ?32.0          EMG Summary Table    Spontaneous MUAP Recruitment  Muscle IA Fib PSW Fasc Other Amp Dur. Poly Pattern  Bilateral Cervical paraspinals (low) Normal None None None _______ Normal Normal Normal Normal  Bilateral Cervical paraspinals (mid) Normal None None None _______ Normal Normal Normal Normal  Bilateral Cervical paraspinals (up) Normal None None None _______ Normal Normal Normal Normal  Bilateral Biceps brachii Normal None None None _______ Normal Normal Normal Normal  Bilateral  Deltoid Normal None None None _______ Normal Normal Normal Normal  Bilateral Triceps brachii Normal None None None _______ Normal Normal Normal Normal  Bilateral  Pronator teres Normal None None None _______ Normal Normal Normal Normal  Bilateral Opponens pollicis Normal None None None _______ Normal Normal Normal Normal  Bilateral First dorsal interosseous Normal None None None _______ Normal Normal Normal Normal  Bilateral Flex Dig Prof(Ulnar) Normal None None None _______ Normal Normal Normal Normal

## 2022-05-23 NOTE — Progress Notes (Addendum)
Full Name: [name] Page Gender: Male MRN #: [id number] Date of Birth: January 03, 1951    Visit Date: 05/20/2022 15:12 Age: 72 Years Examining Physician: Dr. Sarina Ill Referring Physician: Dr. Sarina Ill Height: 5 feet 8 inch  History: Thumb and first finger pain and weakness. 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   Summary:  Nerve Conduction Studies and EMG needle study were performed on the bilateral upper extremities.  The right Median 2nd Digit antidromic sensory nerve showed prolonged distal peak latency (4.1 ms, N<3.5) and reduced amplitude(7 uV, N>20) The right Median 2nd Digit orthodromic sensory nerve showed prolonged distal peak latency (3.4 ms, N<3.4) and reduced amplitude(9 uV, N>10) The left Median 2nd Digit orthodromic sensory nerve showed reduced amplitude(9 uV, N>10)  The right median/ulnar (palm) comparison nerve showed prolonged distal peak latency (Median Palm, 2.0 ms, N<2.2) and abnormal peak latency difference (Median Palm-Ulnar Palm, 0.6 ms, N<0.4) with a relative median delay.    The left median/ulnar (palm) comparison nerve showed prolonged distal peak latency (Median Palm, 2.4 ms, N<2.2) and abnormal peak latency difference (Median Palm-Ulnar Palm, 0.5 ms, N<0.4) with a relative median delay.    All remaining nerves (as indicated in the following tables) were within normal limits.   All remaining muscles (as indicated in the following tables) were within normal limits.     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  No suggestion of polyneuropathy or radiculopathy.  PAin out of proportion to mild CTS. 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 Requests Dr. Amedeo Plenty referral.   ------------------------------- Sarina Ill, M.D.  [facility] [address] [place], [place] [address] Tel:  [phone] Fax: [phone]  Verbal informed consent was obtained from the patient, patient was informed of potential risk of procedure, including bruising, bleeding, hematoma formation, infection, muscle weakness, muscle pain, numbness, among others.        [place]    Nerve / Sites Muscle Latency Ref. Amplitude Ref. Rel Amp Segments Distance Velocity Ref. Area    ms ms mV mV %  cm m/s m/s mVms  R Median - APB     Wrist APB 3.7 ?4.4 9.0 ?4.0 100 Wrist - APB 7   28.6     Upper arm APB 8.6  9.3  104 Upper arm - Wrist 24 49 ?49 32.1  L Median - APB     Wrist APB 4.2 ?4.4 6.2 ?4.0 100 Wrist - APB 7   20.3     Upper arm APB 9.0  5.6  89.7 Upper arm - Wrist 23.5 49 ?49 19.9  R Ulnar - ADM     Wrist ADM 3.1 ?3.3 9.4 ?6.0 100 Wrist - ADM 7   25.6     B.Elbow ADM 5.6  8.7  92 B.Elbow - Wrist 14 55 ?49 27.7     A.Elbow ADM 8.8  8.2  94.5 A.Elbow - B.Elbow 17 53 ?49 28.1  L Ulnar - ADM     Wrist ADM 3.2 ?3.3 9.6 ?6.0 100 Wrist - ADM 7   31.0     B.Elbow ADM 5.7  8.8  91.8 B.Elbow - Wrist 12 48 ?49 30.3     A.Elbow ADM 8.7  8.7  98 A.Elbow - B.Elbow 17 57 ?49 30.7  [place]    Nerve / Sites Rec. Site Peak Lat Ref.  Amp Ref. Segments Distance Peak Diff Ref.    ms ms V V  cm ms ms  R Median - Digit II (Antidromic)     Wrist Dig II 4.1 ?3.5 7 ?20 Wrist - Dig II 13    R Radial - Anatomical snuff box (Forearm)     Forearm Wrist 2.6 ?2.9 18 ?15 Forearm - Wrist 10    R Median, Ulnar - Transcarpal comparison     Median Palm Wrist 2.0 ?2.2 7 ?35 Median Palm - Wrist 8       Ulnar Palm Wrist 1.4 ?2.2 12 ?12 Ulnar Palm - Wrist 8          Median Palm - Ulnar Palm  0.6 ?0.4  L Median, Ulnar - Transcarpal comparison     Median Palm Wrist 2.4 ?2.2 9 ?35 Median Palm - Wrist 8       Ulnar Palm Wrist 2.0 ?2.2 4 ?12 Ulnar Palm - Wrist 8          Median Palm - Ulnar Palm  0.5 ?0.4  R Median - Orthodromic (Dig II, Mid palm)     Dig II Wrist 3.4 ?3.4 9 ?10 Dig II - Wrist 13    L Median - Orthodromic  (Dig II, Mid palm)     Dig II Wrist 2.9 ?3.4 9 ?10 Dig II - Wrist 13    R Ulnar - Orthodromic, (Dig V, Mid palm)     Dig V Wrist 3.1 ?3.1 5 ?5 Dig V - Wrist 11    L Ulnar - Orthodromic, (Dig V, Mid palm)     Dig V Wrist 2.4 ?3.1 5 ?5 Dig V - Wrist 77                       F  Wave    Nerve F Lat Ref.   ms ms  R Ulnar - ADM 31.1 ?32.0  L Ulnar - ADM 30.4 ?32.0          EMG Summary Table    Spontaneous MUAP Recruitment  Muscle IA Fib PSW Fasc Other Amp Dur. Poly Pattern  Bilateral Cervical paraspinals (low) Normal None None None _______ Normal Normal Normal Normal  Bilateral Cervical paraspinals (mid) Normal None None None _______ Normal Normal Normal Normal  Bilateral Cervical paraspinals (up) Normal None None None _______ Normal Normal Normal Normal  Bilateral Biceps brachii Normal None None None _______ Normal Normal Normal Normal  Bilateral  Deltoid Normal None None None _______ Normal Normal Normal Normal  Bilateral Triceps brachii Normal None None None _______ Normal Normal Normal Normal  Bilateral  Pronator teres Normal None None None _______ Normal Normal Normal Normal  Bilateral Opponens pollicis Normal None None None _______ Normal Normal Normal Normal  Bilateral First dorsal interosseous Normal None None None _______ Normal Normal Normal Normal  Bilateral Flex Dig Prof(Ulnar) Normal None None None _______ Normal Normal Normal Normal

## 2022-05-24 ENCOUNTER — Other Ambulatory Visit: Payer: Self-pay

## 2022-05-24 DIAGNOSIS — R760 Raised antibody titer: Secondary | ICD-10-CM

## 2022-05-24 DIAGNOSIS — E559 Vitamin D deficiency, unspecified: Secondary | ICD-10-CM

## 2022-05-24 DIAGNOSIS — E5111 Dry beriberi: Secondary | ICD-10-CM

## 2022-05-24 DIAGNOSIS — E678 Other specified hyperalimentation: Secondary | ICD-10-CM

## 2022-06-02 ENCOUNTER — Other Ambulatory Visit: Payer: Medicare Other

## 2022-06-07 ENCOUNTER — Telehealth: Payer: Self-pay | Admitting: Cardiology

## 2022-06-07 NOTE — Telephone Encounter (Signed)
[facility] called to follow up about approval for ILR

## 2022-06-08 ENCOUNTER — Ambulatory Visit: Payer: Medicare Other | Attending: Cardiology | Admitting: Cardiology

## 2022-06-08 ENCOUNTER — Encounter: Payer: Self-pay | Admitting: Cardiology

## 2022-06-08 VITALS — BP 110/60 | HR 64 | Ht 68.0 in | Wt 182.0 lb

## 2022-06-08 DIAGNOSIS — I700 Atherosclerosis of aorta: Secondary | ICD-10-CM | POA: Diagnosis not present

## 2022-06-08 DIAGNOSIS — G459 Transient cerebral ischemic attack, unspecified: Secondary | ICD-10-CM

## 2022-06-08 NOTE — Progress Notes (Deleted)
Electrophysiology Office Note:    Date:  06/08/2022   ID:  [name] Page, DOB 06-19-1950, MRN [id number]  [facility] Cardiologist:  None  [facility] Electrophysiologist:  [name] Epley, MD   Referring MD: Marin Olp, MD   Chief Complaint: Cryptogenic stroke  History of Present Illness:    [name] Page is a 72 y.o. 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 Marlou Porch.  The patient saw Dr. Marlou Porch April 15, 2022.  He has a history of retinal artery branch occlusion.  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  He has no history of atrial fibrillation.  He is on a statin.  A ZIO monitor was ordered by Dr. Marlou Porch.  The ZIO revealed no atrial fibrillation and thus he is referred to discuss possible loop recorder monitoring for ongoing atrial fibrillation surveillance.        Their past medical, social and family history was reveiwed.   ROS:   Please see the history of present illness.    All other systems reviewed and are negative.  EKGs/Labs/Other Studies Reviewed:    The following studies were reviewed today:  May 21, 2022 ZIO monitor No A-fib     Physical Exam:    VS:  There were no vitals taken for this visit.    Wt Readings from Last 3 Encounters:  05/20/22 184 lb (83.5 kg)  04/19/22 184 lb 3.2 oz (83.6 kg)  04/15/22 189 lb (85.7 kg)     GEN: *** Well nourished, well developed in no acute distress CARDIAC: ***RRR, no murmurs, rubs, gallops RESPIRATORY:  Clear to auscultation without rales, wheezing or rhonchi       ASSESSMENT AND PLAN:    No diagnosis found.  #TIA Pathophysiology of cryptogenic stroke was discussed in detail during today's clinic appointment. I discussed the role of loop recorder monitoring in patients who have suffered a CVA/TIA. There has been no evidence of AF thus far in the patient's evaluation. Loop recorder monitors were discussed in detail including  the implant procedure and its risks. I discussed the monthly monitoring costs associated with loop recorder monitoring. The patient would like to proceed with ILR implant.        Signed, [name] Cork. Quentin Ore, MD, [facility], [facility] 06/08/2022 5:05 AM    Electrophysiology [facility]  -----------------------  SURGEON:  [name] Epley, MD     PREPROCEDURE DIAGNOSIS:  Cryptogenic stroke    POSTPROCEDURE DIAGNOSIS: Cryptogenic stroke     PROCEDURES:   1. Implantable loop recorder implantation    INTRODUCTION:  [name] Page presents with a history of cryptogenic stroke The costs of loop recorder monitoring have been discussed with the patient.    DESCRIPTION OF PROCEDURE:  Informed written consent was obtained.  The patient required no sedation for the procedure today.  Mapping over the patient's chest was performed to identify the area where electrograms were most prominent for ILR recording.  This area was found to be the left parasternal region over the 4th intercostal space. The patients left chest was therefore prepped and draped in the usual sterile fashion. The skin overlying the left parasternal region was infiltrated with lidocaine for local analgesia.  A 0.5-cm incision was made over the left parasternal region over the 3rd intercostal space.  A subcutaneous ILR pocket was fashioned using a combination of sharp and blunt dissection.  A {[id number]} implantable loop recorder was  then placed into the pocket  R waves were very prominent and measured >0.3m.  Steri- Strips and a sterile dressing were then applied.  There were no early apparent complications.     CONCLUSIONS:   1. Successful implantation of a implantable loop recorder for Cryptogenic stroke  2. No early apparent complications.   CLysbeth GalasT. LQuentin Ore MD, [facility], [facility]

## 2022-06-08 NOTE — Patient Instructions (Addendum)
Medication Instructions:  Your physician recommends that you continue on your current medications as directed. Please refer to the Current Medication list given to you today.  Labwork: None ordered.  Testing/Procedures: None ordered.  Follow-Up: As needed   Implantable Loop Recorder Placement, Care After This sheet gives you information about how to care for yourself after your procedure. Your health care provider may also give you more specific instructions. If you have problems or questions, contact your health care provider. What can I expect after the procedure? After the procedure, it is common to have: Soreness or discomfort near the incision. Some swelling or bruising near the incision.  Follow these instructions at home: Incision care  Monitor your cardiac device site for redness, swelling, and drainage. Call the device clinic at [phone] if you experience these symptoms or fever/chills.  Keep the large square bandage on your site for 24 hours and then you may remove it yourself. Keep the steri-strips underneath in place.   You may shower after 72 hours / 3 days from your procedure with the steri-strips in place. They will usually fall off on their own, or may be removed after 10 days. Pat dry.   Avoid lotions, ointments, or perfumes over your incision until it is well-healed.  Please do not submerge in water until your site is completely healed.   Your device is MRI compatible.   Remote monitoring is used to monitor your cardiac device from home. This monitoring is scheduled every month by our office. It allows [place] to keep an eye on the function of your device to ensure it is working properly.  If your wound site starts to bleed apply pressure.    For help with the monitor please call [employer] Monitor Support Specialist directly at [phone].    If you have any questions/concerns please call the device clinic at [phone].  Activity  Return to your  normal activities.  General instructions Follow instructions from your health care provider about how to manage your implantable loop recorder and transmit the information. Learn how to activate a recording if this is necessary for your type of device. You may go through a metal detection gate, and you may let someone hold a metal detector over your chest. Show your ID card if needed. Do not have an MRI unless you check with your health care provider first. Take over-the-counter and prescription medicines only as told by your health care provider. Keep all follow-up visits as told by your health care provider. This is important. Contact a health care provider if: You have redness, swelling, or pain around your incision. You have a fever. You have pain that is not relieved by your pain medicine. You have triggered your device because of fainting (syncope) or because of a heartbeat that feels like it is racing, slow, fluttering, or skipping (palpitations). Get help right away if you have: Chest pain. Difficulty breathing. Summary After the procedure, it is common to have soreness or discomfort near the incision. Change your dressing as told by your health care provider. Follow instructions from your health care provider about how to manage your implantable loop recorder and transmit the information. Keep all follow-up visits as told by your health care provider. This is important. This information is not intended to replace advice given to you by your health care provider. Make sure you discuss any questions you have with your health care provider. Document Released: 03/03/2015 Document Revised: 05/07/2017 Document Reviewed: 05/07/2017 Elsevier Patient Education  2020  [employer].

## 2022-06-08 NOTE — Progress Notes (Signed)
Electrophysiology Office Note:    Date:  06/08/2022   ID:  [name] Page, DOB Jan 25, 1951, MRN [id number]  [facility] Cardiologist:  None  [facility] Electrophysiologist:  [name] Epley, MD   Referring MD: [name] Olp, MD   Chief Complaint: Cryptogenic stroke  History of Present Illness:    [name] Page is a 72 y.o. 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 Marlou Page.  The patient saw Dr. Marlou Page April 15, 2022.  He has a history of retinal artery branch occlusion.  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  He has no history of atrial fibrillation.  He is on a statin.  A ZIO monitor was ordered by Dr. Marlou Page.  The ZIO revealed no atrial fibrillation and thus he is referred to discuss possible loop recorder monitoring for ongoing atrial fibrillation surveillance.  Today, he is accompanied by a family member. He appears well and is willing to proceed with ILR implant.   At the time of his retinal artery branch occlusion, he had initially experienced a visual deficit described as a "black spot in his eye." He denies complete loss of vision, but was found to have permanent damage. No recurring visual disturbances.   He denies any palpitations, chest pain, shortness of breath, or peripheral edema. No lightheadedness, headaches, syncope, orthopnea, or PND.     Their past medical, social and family history was reveiwed.   ROS:   Please see the history of present illness.    All other systems reviewed and are negative.  EKGs/Labs/Other Studies Reviewed:    The following studies were reviewed today:  May 21, 2022 ZIO monitor No A-fib   Physical Exam:    VS:  BP 110/60   Pulse 64   Ht '5\' 8"'$  (1.727 m)   Wt 182 lb (82.6 kg)   SpO2 99%   BMI 27.67 kg/m     Wt Readings from Last 3 Encounters:  06/08/22 182 lb (82.6 kg)  05/20/22 184 lb (83.5 kg)  04/19/22 184 lb 3.2 oz (83.6 kg)     GEN: Well  nourished, well developed in no acute distress CARDIAC: RRR, no murmurs, rubs, gallops RESPIRATORY:  Clear to auscultation without rales, wheezing or rhonchi       ASSESSMENT AND PLAN:    1. TIA (transient ischemic attack)   2. Aortic atherosclerosis (HCC)     #TIA/cryptogenic stroke Pathophysiology of cryptogenic stroke was discussed in detail during today's clinic appointment. I discussed the role of loop recorder monitoring in patients who have suffered a CVA/TIA. There has been no evidence of AF thus far in the patient's evaluation. Loop recorder monitors were discussed in detail including the implant procedure and its risks. I discussed the monthly monitoring costs associated with loop recorder monitoring. The patient would like to proceed with ILR implant.  #Aortic atherosclerosis I encouraged the patient to restart his statin.  Follow-up with EP on an as-needed basis  I,[name] Page,acting as a scribe for [name] Epley, MD.,have documented all relevant documentation on the behalf of [name] Epley, MD,as directed by  [name] Epley, MD while in the presence of [name] Epley, MD.  I, [name] Epley, MD, have reviewed all documentation for this visit. The documentation on 06/08/22 for the exam, diagnosis, procedures, and orders are all accurate and complete.   Signed, [name] Page. [name] Ore, MD, [facility], [facility] 06/08/2022 1:34 PM  Electrophysiology [facility]  -----------------------  SURGEON:  [name] Epley, MD     PREPROCEDURE DIAGNOSIS:  Cryptogenic stroke    POSTPROCEDURE DIAGNOSIS: Cryptogenic stroke     PROCEDURES:   1. Implantable loop recorder implantation    INTRODUCTION:  [name] Page presents with a history of cryptogenic stroke The costs of loop recorder monitoring have been discussed with the patient.    DESCRIPTION OF PROCEDURE:  Informed written consent was obtained.  The patient required no sedation for the  procedure today.  Mapping over the patient's chest was performed to identify the area where electrograms were most prominent for ILR recording.  This area was found to be the left parasternal region over the 4th intercostal space. The patients left chest was therefore prepped and draped in the usual sterile fashion. The skin overlying the left parasternal region was infiltrated with lidocaine for local analgesia.  A 0.5-cm incision was made over the left parasternal region over the 3rd intercostal space.  A subcutaneous ILR pocket was fashioned using a combination of sharp and blunt dissection.  A Medtronic Reveal [place] [phone]) implantable loop recorder was then placed into the pocket  R waves were very prominent and measured >0.64m.  Steri- Strips and a sterile dressing were then applied.  There were no early apparent complications.     CONCLUSIONS:   1. Successful implantation of a implantable loop recorder for Cryptogenic stroke  2. No early apparent complications.   CLysbeth GalasT. LQuentin Ore MD, [name]

## 2022-06-14 ENCOUNTER — Other Ambulatory Visit (INDEPENDENT_AMBULATORY_CARE_PROVIDER_SITE_OTHER): Payer: Self-pay

## 2022-06-14 ENCOUNTER — Other Ambulatory Visit: Payer: Self-pay | Admitting: Neurology

## 2022-06-14 DIAGNOSIS — E559 Vitamin D deficiency, unspecified: Secondary | ICD-10-CM

## 2022-06-14 DIAGNOSIS — G620 Drug-induced polyneuropathy: Secondary | ICD-10-CM

## 2022-06-14 DIAGNOSIS — Z0289 Encounter for other administrative examinations: Secondary | ICD-10-CM

## 2022-06-14 DIAGNOSIS — Z862 Personal history of diseases of the blood and blood-forming organs and certain disorders involving the immune mechanism: Secondary | ICD-10-CM

## 2022-06-14 DIAGNOSIS — T452X5A Adverse effect of vitamins, initial encounter: Secondary | ICD-10-CM | POA: Diagnosis not present

## 2022-06-14 DIAGNOSIS — E785 Hyperlipidemia, unspecified: Secondary | ICD-10-CM

## 2022-06-14 DIAGNOSIS — E519 Thiamine deficiency, unspecified: Secondary | ICD-10-CM

## 2022-06-16 ENCOUNTER — Encounter: Payer: Self-pay | Admitting: Family Medicine

## 2022-06-16 ENCOUNTER — Ambulatory Visit (INDEPENDENT_AMBULATORY_CARE_PROVIDER_SITE_OTHER): Payer: Medicare Other | Admitting: Family Medicine

## 2022-06-16 VITALS — BP 126/72 | HR 73 | Temp 97.9°F | Ht 68.0 in | Wt 179.2 lb

## 2022-06-16 DIAGNOSIS — R2689 Other abnormalities of gait and mobility: Secondary | ICD-10-CM

## 2022-06-16 DIAGNOSIS — R252 Cramp and spasm: Secondary | ICD-10-CM | POA: Diagnosis not present

## 2022-06-16 DIAGNOSIS — Z85118 Personal history of other malignant neoplasm of bronchus and lung: Secondary | ICD-10-CM

## 2022-06-16 DIAGNOSIS — F3341 Major depressive disorder, recurrent, in partial remission: Secondary | ICD-10-CM | POA: Insufficient documentation

## 2022-06-16 DIAGNOSIS — R7989 Other specified abnormal findings of blood chemistry: Secondary | ICD-10-CM

## 2022-06-16 DIAGNOSIS — I700 Atherosclerosis of aorta: Secondary | ICD-10-CM

## 2022-06-16 DIAGNOSIS — E785 Hyperlipidemia, unspecified: Secondary | ICD-10-CM | POA: Diagnosis not present

## 2022-06-16 DIAGNOSIS — R42 Dizziness and giddiness: Secondary | ICD-10-CM

## 2022-06-16 DIAGNOSIS — I10 Essential (primary) hypertension: Secondary | ICD-10-CM

## 2022-06-16 LAB — COMPREHENSIVE METABOLIC PANEL
ALT: 20 U/L (ref 0–53)
AST: 18 U/L (ref 0–37)
Albumin: 4.1 g/dL (ref 3.5–5.2)
Alkaline Phosphatase: 79 U/L (ref 39–117)
BUN: 18 mg/dL (ref 6–23)
CO2: 29 mEq/L (ref 19–32)
Calcium: 9.6 mg/dL (ref 8.4–10.5)
Chloride: 101 mEq/L (ref 96–112)
Creatinine, Ser: 0.96 mg/dL (ref 0.40–1.50)
GFR: 79.21 mL/min (ref >60.00)
Glucose, Bld: 106 mg/dL — ABNORMAL HIGH (ref 70–99)
Potassium: 4.3 mEq/L (ref 3.5–5.1)
Sodium: 137 mEq/L (ref 135–145)
Total Bilirubin: 0.9 mg/dL (ref 0.2–1.2)
Total Protein: 6.3 g/dL (ref 6.0–8.3)

## 2022-06-16 LAB — TESTOSTERONE: Testosterone: 148.47 ng/dL — ABNORMAL LOW (ref 300.00–890.00)

## 2022-06-16 LAB — MAGNESIUM: Magnesium: 2.1 mg/dL (ref 1.5–2.5)

## 2022-06-16 LAB — LDL CHOLESTEROL, DIRECT: Direct LDL: 56 mg/dL

## 2022-06-16 MED ORDER — GABAPENTIN 300 MG PO CAPS: 900.0000 mg | ORAL_CAPSULE | Freq: Every day | ORAL | 3 refills | Status: DC

## 2022-06-16 MED ORDER — POTASSIUM CHLORIDE ER 10 MEQ PO TBCR: EXTENDED_RELEASE_TABLET | ORAL | 2 refills | Status: DC

## 2022-06-16 NOTE — Patient Instructions (Addendum)
We will either call you (or see alternate below) within two weeks about your referral to neuro PT . Our referral specialist will sometimes also send you a mychart link once referral is approved and then you will call the # listed on there (let us know if you do not see this within 2 weeks or have not received call)  Please go to [facility] (updated 05/31/2019) - located [address] across the street from [place] - in the basement - Hours: 8:30-5:00 PM M-F (with lunch from 12:30- 1 PM). You do NOT need an appointment.    Trial gabapentin 900 mg before bed  Please stop by lab before you go If you have mychart- we will send your results within 3 business days of [place] receiving them.  If you do not have mychart- we will call you about results within 5 business days of [place] receiving them.  *please also note that you will see labs on mychart as soon as they post. I will later go in and write notes on them- will say "notes from Dr. Yong Channel"   Recommended follow up: Return in about 4 months (around 10/16/2022) for followup or sooner if needed.Schedule b4 you leave.

## 2022-06-16 NOTE — Progress Notes (Signed)
Phone [phone] In person visit   Subjective:   [name] Page is a 72 y.o. year old very pleasant male patient who presents for/with See problem oriented charting Chief Complaint  Patient presents with   Follow-up    ([place])   Hypertension   stroke    Pt states he had a small stroke in Jan.   Labs Only    Pt would like to have potassium, magnesium and testosterone level checked.    Past Medical History-  Patient Active Problem List   Diagnosis Date Noted   Chronic pain syndrome 08/31/2016    Priority: High   History of lung cancer 12/11/2012    Priority: High   Vitamin D deficiency 02/11/2022    Priority: Medium    Aortic atherosclerosis ([place]) 11/03/2020    Priority: Medium    History of adenomatous polyp of colon 03/24/2018    Priority: Medium    Generalized anxiety disorder 03/12/2014    Priority: Medium    Essential hypertension 03/12/2014    Priority: Medium    Peripheral neuropathy, idiopathic 12/22/2012    Priority: Medium    Dyspnea 07/14/2012    Priority: Medium    Hypertriglyceridemia 07/14/2012    Priority: Medium    Depression 02/16/2010    Priority: Medium    Low testosterone 11/21/2007    Priority: Medium    Chronic bilateral low back pain 06/05/2019    Priority: Low   Venous stasis of both lower extremities 06/05/2019    Priority: Low   Mastoiditis 03/09/2017    Priority: Low   Palpitations 08/11/2016    Priority: Low   Eustachian tube dysfunction 12/17/2014    Priority: Low   SI (sacroiliac) joint dysfunction 09/19/2013    Priority: Low   Nonspecific abnormal electrocardiogram (ECG) (EKG) 07/14/2012    Priority: Low   Allergic reaction, history of 10/25/2011    Priority: Low   Rectal or anal pain 10/01/2010    Priority: Low   Allergic rhinitis 06/13/2007    Priority: Low   GERD 06/13/2007    Priority: Low   IBS (irritable bowel syndrome) 06/13/2007    Priority: Low   Recurrent major depressive disorder, in partial remission  ([place]) 06/16/2022   Nonallopathic lesion of sacral region 09/19/2013   Nonallopathic lesion of thoracic region 09/19/2013   Nonallopathic lesion of lumbosacral region 09/19/2013    Medications- reviewed and updated Current Outpatient Medications  Medication Sig Dispense Refill   Acetylcarnitine HCl (ACETYL L-CARNITINE PO) Take by mouth.     Ascorbic Acid (VITAMIN C) 1000 MG tablet      aspirin EC 325 MG tablet Take 325 mg by mouth daily.     buPROPion (WELLBUTRIN XL) 300 MG 24 hr tablet TAKE 1 TABLET BY MOUTH DAILY 90 tablet 3   Cholecalciferol (VITAMIN D) 2000 UNITS CAPS Take 1 capsule by mouth daily.     clonazePAM (KLONOPIN) 0.5 MG tablet Take 1 tablet (0.5 mg total) by mouth 2 (two) times daily as needed for anxiety (do not drive for 12 hours after taking). 180 tablet 1   diltiazem (CARDIZEM CD) 180 MG 24 hr capsule TAKE 1 CAPSULE BY MOUTH DAILY 100 capsule 2   hydrochlorothiazide (HYDRODIURIL) 25 MG tablet Take 1 tablet (25 mg total) by mouth daily. 90 tablet 3   methylcellulose (CITRUCEL) oral powder Use as directed once daily     NEEDLE, DISP, 22 G 22G X 1" MISC Used to inject testosterone weekly 50 each 2   rosuvastatin (  CRESTOR) 5 MG tablet Take 1 tablet (5 mg total) by mouth daily. 90 tablet 3   gabapentin (NEURONTIN) 300 MG capsule Take 3 capsules (900 mg total) by mouth at bedtime. 300 capsule 3   HYDROcodone-acetaminophen (NORCO/VICODIN) 5-325 MG tablet Take 0.5-1 tablets by mouth 2 (two) times daily as needed. Do not use indicated instructions more than one to three times per week. (Patient not taking: Reported on 06/16/2022) 55 tablet 0   potassium chloride (KLOR-CON) 10 MEQ tablet TAKE 3 TABLETS BY MOUTH IN  THE MORNING AND 2 TABLETS  BY MOUTH IN THE AFTERNOON 500 tablet 2   No current facility-administered medications for this visit.     Objective:  BP 126/72   Pulse 73   Temp 97.9 F (36.6 C)   Ht '5\' 8"'$  (1.727 m)   Wt 179 lb 3.2 oz (81.3 kg)   SpO2 97%   BMI 27.25  kg/m  Gen: NAD, resting comfortably    Assessment and Plan   #History of lung cancer/actually carcinoid tumor malignant-we follow an annual chest x-ray on him.  Last chest x-ray 05/01/2021- agrees to update today  #Chronic pain syndrome/neuropathy S: Medication: Gabapentin 600 mg at bedtime but has needed more lately, sparing hydrocodone 3-4x a week- other than needed more with loop recorder implanted.  He knows to space hydrocodone and clonazepam at least 12 hours  A/P: neuropathy worsening- higher gabapentin helpful- will trial 900 mg at bedtime- he can still use 2 if he is having a better day  - also continue sparing hydrocodone  #hypertension/hypokalemia requiring potassium 20 mEq twice daily S: medication: hydrochlorothiazide 25 mg, diltiazem 180 mg extended release (amlodipine caused edema in past).   BP Readings from Last 3 Encounters:  06/16/22 126/72  06/08/22 110/60  05/20/22 133/74  A/P: stable- continue current medicines   # History of branch retinal artery occlusion (BRAO thought possibly embolic - 3 weeks after covid shot-confirmed by Dr. Kristopher Oppenheim with left visual field disturbance in July 2024)-plans on loop recorder with Dr. Quentin Ore #microvascular ischemia on prior MRI # Hyperlipidemia. LPA not elevated # Aortic atherosclerosis-prior coronary artery calcium score of 0 on 07/08/2020 S: Medication:Atorvastatin 40 mg--> '10mg'$  (patient concern about neuropathy)--> rosuvastatin 5 mg, aspirin 325 mg #Had microvascular ischemia on prior MRI Lab Results  Component Value Date   CHOL 132 02/11/2022   HDL 50.90 02/11/2022   LDLCALC 58 02/11/2022   TRIG 116.0 02/11/2022   CHOLHDL 3 02/11/2022   A/P: Patient with extensive workup for branch retinal artery occlusion with loop recorder in place right now-continue aspirin -Check LDL today-suspect lipids remain controlled with LDL under 70 which is the goal both for microvascular ischemia as well as aortic  atherosclerosis (presumed stable)-for now continue current medication rosuvastatin 5 mg  # Mastoid effusion on prior neuroimaging-trial of prednisone and had recommended ENT consult per neurology-has seen Dr. Claria Dice and Dr. Redmond Baseman in the past-getting up opinion from Dr. Benjamine Mola- essentially recommended no ENT evaluation but advised neuro PT- we willr efer today   #Hypogonadism-on testosterone treatment in the past- stopped after BRAO in January 2024- update testosterone today  # Hyperglycemia/insulin resistance/prediabetes-A1c 5.9 in 2023  S:  Medication: none Exercise and diet- tolerating treadmill as long as using hand support and plans to do some weights when loop recordser Contour next in AM he bought- getting around 110 sometimes higher Lab Results  Component Value Date   HGBA1C 5.6 04/19/2022   HGBA1C 5.9 02/11/2022   HGBA1C 5.6 12/17/2021  A/P: a1c improved on last check- continue to monitor at least every 6 months  Recommended follow up: Return in about 4 months (around 10/16/2022) for followup or sooner if needed.Schedule b4 you leave. Future Appointments  Date Time Provider [place]  07/02/2022 10:00 AM [facility] HE[facility] COACH [facility]  09/08/2022 10:00 AM Elayne Snare, MD [facility] None   Lab/Order associations:   ICD-10-CM   1. Vertigo  R42 Ambulatory referral to Physical Therapy    2. Balance disorder  R26.89 Ambulatory referral to Physical Therapy    3. Leg cramps  R25.2 Magnesium    4. Low testosterone  R79.89 Testosterone    5. Essential hypertension  I10 Comprehensive metabolic panel    6. History of lung cancer  Z85.118 DG Chest 2 View    7. Hyperlipidemia, unspecified hyperlipidemia type  E78.5 LDL cholesterol, direct    8. Aortic atherosclerosis (HCC)  I70.0       Meds ordered this encounter  Medications   potassium chloride (KLOR-CON) 10 MEQ tablet    Sig: TAKE 3 TABLETS BY MOUTH IN  THE MORNING AND 2 TABLETS  BY MOUTH IN THE AFTERNOON     Dispense:  500 tablet    Refill:  2    Requesting 1 year supply   gabapentin (NEURONTIN) 300 MG capsule    Sig: Take 3 capsules (900 mg total) by mouth at bedtime.    Dispense:  300 capsule    Refill:  3    Please send a replace/new response with 100-Day Supply if appropriate to maximize member benefit. Requesting 1 year supply.    Time Spent: 40 minutes of total time (9:26 AM- 10:06 AM) was spent on the date of the encounter performing the following actions: chart review prior to seeing the patient including review of most recent hospitalization as well as multiple follow-up visits, obtaining history directly from patient, performing a medically necessary exam-primarily repeated blood pressure, counseling on the treatment plan, placing orders, and documenting in our EHR.    Return precautions advised.  [name], MD

## 2022-06-17 ENCOUNTER — Telehealth: Payer: Self-pay | Admitting: Family Medicine

## 2022-06-17 ENCOUNTER — Encounter: Payer: Self-pay | Admitting: Neurology

## 2022-06-17 LAB — VITAMIN D 25 HYDROXY (VIT D DEFICIENCY, FRACTURES): Vit D, 25-Hydroxy: 43.2 ng/mL (ref 30.0–100.0)

## 2022-06-17 LAB — VITAMIN B1: Thiamine: 147.2 nmol/L (ref 66.5–200.0)

## 2022-06-17 LAB — LUPUS ANTICOAGULANT PANEL
Dilute Viper Venom Time: 34.6 s (ref 0.0–47.0)
PTT Lupus Anticoagulant: 38 s (ref 0.0–43.5)

## 2022-06-17 LAB — LIPOPROTEIN A (LPA): Lipoprotein (a): <8.4 nmol/L (ref <75.0)

## 2022-06-17 LAB — VITAMIN B6: Vitamin B6: 10.2 ug/L (ref 3.4–65.2)

## 2022-06-17 NOTE — Telephone Encounter (Signed)
Copied from [place] [phone]. Topic: Medicare AWV >> Jun 17, 2022  1:10 PM Gillis Santa wrote: Reason for CRM: Called patient to schedule Medicare Annual Wellness Visit (AWV). Left message for patient to call back and REschedule Medicare Annual Wellness Visit (AWV).  Last date of AWV: 06/19/2021  Please schedule an appointment at any time with Otila Kluver, [facility]. Please reschedule the AWVS from 07/02/2022 to another date/time.  If any questions, please contact me at [phone].  Thank you ,  Shaune Pollack [place] AWV TEAM Direct Dial [phone]

## 2022-06-21 ENCOUNTER — Other Ambulatory Visit: Payer: Self-pay | Admitting: Neurology

## 2022-06-21 ENCOUNTER — Telehealth: Payer: Self-pay | Admitting: Family Medicine

## 2022-06-21 ENCOUNTER — Telehealth: Payer: Self-pay | Admitting: Neurology

## 2022-06-21 DIAGNOSIS — M778 Other enthesopathies, not elsewhere classified: Secondary | ICD-10-CM

## 2022-06-21 NOTE — Procedures (Signed)
Pin out of proportion to mild CTS. 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 Requests Dr. Amedeo Plenty referral.

## 2022-06-21 NOTE — Telephone Encounter (Signed)
Referral for orthopedic surgery fax to [facility] to see Dr.  Roseanne Kaufman III. Phone: [phone], Fax:  [phone].

## 2022-06-21 NOTE — Telephone Encounter (Signed)
Contacted [name] Page to schedule their annual wellness visit. Appointment made for 06/28/2022.  [place] Direct Dial [phone]

## 2022-06-23 ENCOUNTER — Encounter: Payer: Self-pay | Admitting: Neurology

## 2022-06-24 ENCOUNTER — Ambulatory Visit: Payer: Medicare Other | Attending: Family Medicine | Admitting: Physical Therapy

## 2022-06-24 ENCOUNTER — Other Ambulatory Visit: Payer: Self-pay

## 2022-06-24 ENCOUNTER — Encounter: Payer: Self-pay | Admitting: Physical Therapy

## 2022-06-24 DIAGNOSIS — R2681 Unsteadiness on feet: Secondary | ICD-10-CM | POA: Insufficient documentation

## 2022-06-24 DIAGNOSIS — R2689 Other abnormalities of gait and mobility: Secondary | ICD-10-CM | POA: Diagnosis not present

## 2022-06-24 DIAGNOSIS — R42 Dizziness and giddiness: Secondary | ICD-10-CM | POA: Diagnosis not present

## 2022-06-24 NOTE — Therapy (Signed)
OUTPATIENT PHYSICAL THERAPY VESTIBULAR EVALUATION     Patient Name: [name] BARROZO MRN: [id number] DOB:1950-09-30, 72 y.o., male Today's Date: 06/25/2022  END OF SESSION:  PT End of Session - 06/25/22 1245     Visit Number 1    Number of Visits 13    Date for PT Re-Evaluation 08/06/22    Authorization Type [employer] Medicare    Progress Note Due on Visit 10    PT Start Time 1019    PT Stop Time 1104    PT Time Calculation (min) 45 min    Activity Tolerance Patient tolerated treatment well    Behavior During Therapy WFL for tasks assessed/performed             Past Medical History:  Diagnosis Date   Allergic rhinitis, cause unspecified    MILD   Anxiety    Arthritis    OA   Cancer ([place])    lung tumor - NO CHEMO NO RADIATION   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   Cataract    forming   Depressive disorder, not elsewhere classified    Diverticulitis    X 1 EPISODE   Esophageal reflux    past hx    HTN (hypertension)    Hyperlipidemia    CONTROLLED   Lymphocytic colitis    Neuromuscular disorder (HCC)    neuropathy   Neuropathy    Other testicular hypofunction    Palpitations    Personal history of urinary calculi    LAST IN THE 80'S   Past Surgical History:  Procedure Laterality Date   COLONOSCOPY     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   [name]   parotid tumor surgery     [place]- BENIGN   POLYPECTOMY     TONSILLECTOMY AND ADENOIDECTOMY     Patient Active Problem List   Diagnosis Date Noted   Recurrent major depressive disorder, in partial remission ([place]) 06/16/2022   Vitamin D deficiency 02/11/2022   Aortic atherosclerosis ([place]) 11/03/2020   Chronic bilateral low back pain 06/05/2019   Venous stasis of both lower extremities 06/05/2019   History of adenomatous polyp of colon 03/24/2018   Mastoiditis 03/09/2017   Chronic pain syndrome 08/31/2016   Palpitations 08/11/2016   Eustachian  tube dysfunction 12/17/2014   Generalized anxiety disorder 03/12/2014   Essential hypertension 03/12/2014   SI (sacroiliac) joint dysfunction 09/19/2013   Nonallopathic lesion of sacral region 09/19/2013   Nonallopathic lesion of thoracic region 09/19/2013   Nonallopathic lesion of lumbosacral region 09/19/2013   Peripheral neuropathy, idiopathic 12/22/2012   History of lung cancer 12/11/2012   Dyspnea 07/14/2012   Nonspecific abnormal electrocardiogram (ECG) (EKG) 07/14/2012   Hypertriglyceridemia 07/14/2012   Allergic reaction, history of 10/25/2011   Rectal or anal pain 10/01/2010   Depression 02/16/2010   Low testosterone 11/21/2007   Allergic rhinitis 06/13/2007   GERD 06/13/2007   IBS (irritable bowel syndrome) 06/13/2007    PCP: Marin Olp, MD  REFERRING PROVIDER: Marin Olp, MD   REFERRING DIAG:  R42 (ICD-10-CM) - Vertigo  R26.89 (ICD-10-CM) - Balance disorder    THERAPY DIAG:  Dizziness and giddiness  Unsteadiness on feet  ONSET DATE: 06/16/2022 (MD referral); started 12/2021  Rationale for Evaluation and Treatment: Rehabilitation  SUBJECTIVE:   SUBJECTIVE STATEMENT: Retired [employer]; and in 2018 had a bad episode of vertigo.  In 12/2021, had severe episode of vertigo that lasted about a month.  Went to ED and that ruled out CVA.  Put me on a course of prednisone.  Have had balance problems for a couple of years.  Set up for [facility] with audiologist for neuro-vestibular testing in May. 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 Yes: NPRS scale: "a lot"/10 Pain location: feet, up to knees Pain description: neuropathy pain Aggravating factors: being on feet makes it worse Relieving factors: Gabapentin  PRECAUTIONS: Fall and Other: hx of vertigo, hx of branch retinal artery occlusion, hx of mastoid effusion .   Has loop recorder implanted 2 weeks prior to PT eval  WEIGHT BEARING RESTRICTIONS: No  FALLS: Has patient fallen in last 6 months? Yes. 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   IMPRESSION: 1. Normal brain MRI for age. No acute intracranial abnormality identified. 2. Moderate to large left mastoid effusion. Finding is of uncertain significance. Correlation with physical exam suggested.   CTA H&N 1/9/024:  FINDINGS: CT HEAD FINDINGS   Brain: No evidence of acute large vascular territory infarction, hemorrhage, hydrocephalus, extra-axial collection or mass lesion/mass effect.   Vascular: Detailed below.   Skull: No acute fracture.   Sinuses/Orbits: No acute finding.   Other: Small left mastoid effusion.   Review of the MIP images confirms the above findings   CTA NECK FINDINGS   Aortic arch: Great vessel origins are patent without significant stenosis.   Right carotid system: No evidence of dissection, stenosis (50% or greater), or occlusion.   Left carotid system: No evidence of dissection, stenosis (50% or greater), or occlusion.   Vertebral arteries: Left dominant. No evidence of dissection, stenosis (50% or greater), or occlusion.   Skeleton: No acute abnormality.   Other neck: No acute abnormality.   Upper chest: Visualized lung apices are clear.   Review of the MIP images confirms the above findings   CTA HEAD FINDINGS   Anterior circulation: Bilateral intracranial ICAs MCAs, and ACAs are patent without proximal hemodynamically significant stenosis.   Posterior circulation: Bilateral low intradural vertebral arteries, basilar artery, and bilateral posterior cerebral  arteries are patent without proximal hemodynamically significant stenosis.   Venous sinuses: Not well evaluated due to arterial timing.   Review of the MIP images confirms the above findings   IMPRESSION: 1. No evidence of acute intracranial abnormality. 2. No large vessel occlusion or proximal hemodynamically significant stenosis  COGNITION: Overall cognitive status: Within functional limits for tasks assessed   POSTURE:  No Significant postural limitations  Cervical ROM:  WFL active ROM in sitting   TRANSFERS: Assistive device utilized: None  Sit to stand: Modified independence Stand to sit: Modified independence  PATIENT SURVEYS:  FOTO intake score 56; predicted 55  VESTIBULAR ASSESSMENT:  GENERAL OBSERVATION: No acute distress   SYMPTOM BEHAVIOR:  Subjective history: Dizziness occurred in September 2023-was so bad, I went to ED.  Occurs while looking down, looking up, turning head in bed.  Momentary, but feels like I might fall over.  In January, had visual deficit while working on the computer.    Non-Vestibular symptoms: changes in vision  Type of dizziness: Imbalance (Disequilibrium)  and "Funny feeling in the head"  Frequency: several times per day  Duration: seconds  Aggravating factors: Induced by motion: looking up at the ceiling, bending down to the ground, and turning head quickly  Relieving factors: head stationary and previous course of prednisone  Progression of symptoms: better  OCULOMOTOR EXAM:  Ocular Alignment: abnormal and L hypermetria (R is lower)  Ocular ROM: No Limitations  Spontaneous Nystagmus: absent  Gaze-Induced Nystagmus: right beating with right gaze  Smooth Pursuits:  saccadic   Saccades: intact  Convergence/Divergence: 5  cm   VESTIBULAR - OCULAR REFLEX:   Slow VOR: Normal  VOR Cancellation: Normal  Head-Impulse Test: HIT Right: positive HIT Left: negative    POSITIONAL TESTING: Right Dix-Hallpike: no nystagmus and no  symtpoms Left Dix-Hallpike: no nystagmus and reports mild dizzinesss/spinning Right Roll Test: no nystagmus Left Roll Test: no nystagmus       FUNCTIONAL TESTS:  M-CTSIB  Condition 1: Firm Surface, EO 30 Sec, Normal Sway  Condition 2: Firm Surface, EC 30 Sec, Mild Sway  Condition 3: Foam Surface, EO 30 Sec, Mild Sway  Condition 4: Foam Surface, EC 30 Sec, moderate Sway     VESTIBULAR TREATMENT:                                                                                                   DATE: 06/24/2022 (treatment not yet initiated)   PATIENT EDUCATION: Education details: Eval results, POC Person educated: Patient Education method: Explanation Education comprehension: verbalized understanding  HOME EXERCISE PROGRAM:  GOALS: Goals reviewed with patient? Yes  SHORT TERM GOALS: Target date: 07/23/2022  Pt will be independent with HEP for improved dizziness and balance. Baseline: Goal status: INITIAL  2.  FGA to be assessed, and goal to be written as appropriate. Baseline:  Goal status: INITIAL  LONG TERM GOALS: Target date: 08/06/2022  Pt will be independent with HEP for improved dizziness and balance. Baseline:  Goal status: INITIAL  2.  Pt will perform Condition 4 on MCTSIB with mild sway or less, for improved balance. Baseline: mod sway Goal status: INITIAL  3.  Pt will report 50% improvement in dizziness with functional daily activities. Baseline:  Goal status: INITIAL  ASSESSMENT:  CLINICAL IMPRESSION: Patient is a 72 y.o. male who was seen today for physical therapy evaluation and treatment for vertigo and balance disorder.  He has hx of dizziness/vertigo episodes that were severe and lasted about a month, starting back in September 2023.  Other PMH above includes neuropathy, mastoid effusion (he has had prednisone treatment) and he has seen Dr. Benjamine Mola (notes not available to PT at eval) as well as branch retinal artery occlusion in 04/2022, which has  resolved.  He reports feelings of dizziness which head motions, sometimes spinning, disequilibrium.  He also reports frequent LOB.  He presents today with + HIT to R side, dizziness noted with L DH, and decreased vestibular system use for balance noted on Condition 4 of MCTSIB.  He will benefit from skilled PT towards goals for improved dizziness, functional mobility, and decreased fall risk.  OBJECTIVE IMPAIRMENTS:  Abnormal gait, decreased balance, difficulty walking, and dizziness.   ACTIVITY LIMITATIONS: bending, squatting, sleeping, bed mobility, reach over head, and locomotion level  PARTICIPATION LIMITATIONS: community activity and fitness  PERSONAL FACTORS: 3+ comorbidities: see above  are also affecting patient's functional outcome.   REHAB POTENTIAL: Good  CLINICAL DECISION MAKING: Evolving/moderate complexity  EVALUATION COMPLEXITY: Moderate   PLAN:  PT FREQUENCY: 1-2x/week  PT DURATION: 6 weeks plus eval  PLANNED INTERVENTIONS: Therapeutic exercises, Therapeutic activity, Neuromuscular re-education, Balance training, Gait training, Patient/Family education, Self Care, Vestibular training, and Canalith repositioning  PLAN FOR NEXT SESSION: Retest BPPV and treat as appropriate.  Check FGA and write goal.  Initiate HEP for balance   Shameca Landen W., PT 06/25/2022, 1:23 PM   [facility] at [facility] [place], [place], [place] [address] Phone # [phone] Fax # [phone]

## 2022-06-27 ENCOUNTER — Encounter: Payer: Self-pay | Admitting: Cardiology

## 2022-06-28 ENCOUNTER — Ambulatory Visit (INDEPENDENT_AMBULATORY_CARE_PROVIDER_SITE_OTHER): Payer: Medicare Other

## 2022-06-28 VITALS — Wt 170.6 lb

## 2022-06-28 DIAGNOSIS — Z0000 Encounter for general adult medical examination without abnormal findings: Secondary | ICD-10-CM | POA: Diagnosis not present

## 2022-06-28 NOTE — Patient Instructions (Signed)
[name] Page , Thank you for taking time to come for your Medicare Wellness Visit. I appreciate your ongoing commitment to your health goals. Please review the following plan we discussed and let me know if I can assist you in the future.   These are the goals we discussed:  Goals      Patient Stated     Weight loss      Patient Stated     Weight loss and exercise         This is a list of the screening recommended for you and due dates:  Health Maintenance  Topic Date Due   COVID-19 Vaccine (7 - 2023-24 season) 07/02/2022*   Medicare Annual Wellness Visit  06/28/2023   Colon Cancer Screening  02/24/2031   DTaP/Tdap/Td vaccine (4 - Td or Tdap) 05/29/2031   Pneumonia Vaccine  Completed   Flu Shot  Completed   Hepatitis C Screening: USPSTF Recommendation to screen - Ages 18-79 yo.  Completed   Zoster (Shingles) Vaccine  Completed   HPV Vaccine  Aged Out  *Topic was postponed. The date shown is not the original due date.    Advanced directives: Advance directive discussed with you today. Even though you declined this today please call our office should you change your mind and we can give you the proper paperwork for you to fill out.  Conditions/risks identified: weight loss and exercise   Next appointment: Follow up in one year for your annual wellness visit.   Preventive Care 72 Years and Older, Male  Preventive care refers to lifestyle choices and visits with your health care provider that can promote health and wellness. What does preventive care include? A yearly physical exam. This is also called an annual well check. Dental exams once or twice a year. Routine eye exams. Ask your health care provider how often you should have your eyes checked. Personal lifestyle choices, including: Daily care of your teeth and gums. Regular physical activity. Eating a healthy diet. Avoiding tobacco and drug use. Limiting alcohol use. Practicing safe sex. Taking low doses of  aspirin every day. Taking vitamin and mineral supplements as recommended by your health care provider. What happens during an annual well check? The services and screenings done by your health care provider during your annual well check will depend on your age, overall health, lifestyle risk factors, and family history of disease. Counseling  Your health care provider may ask you questions about your: Alcohol use. Tobacco use. Drug use. Emotional well-being. Home and relationship well-being. Sexual activity. Eating habits. History of falls. Memory and ability to understand (cognition). Work and work Statistician. Screening  You may have the following tests or measurements: Height, weight, and BMI. Blood pressure. Lipid and cholesterol levels. These may be checked every 5 years, or more frequently if you are over 12 years old. Skin check. Lung cancer screening. You may have this screening every year starting at age 72 if you have a 30-pack-year history of smoking and currently smoke or have quit within the past 15 years. Fecal occult blood test (FOBT) of the stool. You may have this test every year starting at age 72. Flexible sigmoidoscopy or colonoscopy. You may have a sigmoidoscopy every 5 years or a colonoscopy every 10 years starting at age 73. Prostate cancer screening. Recommendations will vary depending on your family history and other risks. Hepatitis C blood test. Hepatitis B blood test. Sexually transmitted disease (STD) testing. Diabetes screening. This is done by checking  your blood sugar (glucose) after you have not eaten for a while (fasting). You may have this done every 1-3 years. Abdominal aortic aneurysm (AAA) screening. You may need this if you are a current or former smoker. Osteoporosis. You may be screened starting at age 72 if you are at high risk. Talk with your health care provider about your test results, treatment options, and if necessary, the need for more  tests. Vaccines  Your health care provider may recommend certain vaccines, such as: Influenza vaccine. This is recommended every year. Tetanus, diphtheria, and acellular pertussis (Tdap, Td) vaccine. You may need a Td booster every 10 years. Zoster vaccine. You may need this after age 72. Pneumococcal 13-valent conjugate (PCV13) vaccine. One dose is recommended after age 72. Pneumococcal polysaccharide (PPSV23) vaccine. One dose is recommended after age 72. Talk to your health care provider about which screenings and vaccines you need and how often you need them. This information is not intended to replace advice given to you by your health care provider. Make sure you discuss any questions you have with your health care provider. Document Released: 04/18/2015 Document Revised: 12/10/2015 Document Reviewed: 01/21/2015 Elsevier Interactive Patient Education  2017 [employer] Prevention in the Home Falls can cause injuries. They can happen to people of all ages. There are many things you can do to make your home safe and to help prevent falls. What can I do on the outside of my home? Regularly fix the edges of walkways and driveways and fix any cracks. Remove anything that might make you trip as you walk through a door, such as a raised step or threshold. Trim any bushes or trees on the path to your home. Use bright outdoor lighting. Clear any walking paths of anything that might make someone trip, such as rocks or tools. Regularly check to see if handrails are loose or broken. Make sure that both sides of any steps have handrails. Any raised decks and porches should have guardrails on the edges. Have any leaves, snow, or ice cleared regularly. Use sand or salt on walking paths during winter. Clean up any spills in your garage right away. This includes oil or grease spills. What can I do in the bathroom? Use night lights. Install grab bars by the toilet and in the tub and shower.  Do not use towel bars as grab bars. Use non-skid mats or decals in the tub or shower. If you need to sit down in the shower, use a plastic, non-slip stool. Keep the floor dry. Clean up any water that spills on the floor as soon as it happens. Remove soap buildup in the tub or shower regularly. Attach bath mats securely with double-sided non-slip rug tape. Do not have throw rugs and other things on the floor that can make you trip. What can I do in the bedroom? Use night lights. Make sure that you have a light by your bed that is easy to reach. Do not use any sheets or blankets that are too big for your bed. They should not hang down onto the floor. Have a firm chair that has side arms. You can use this for support while you get dressed. Do not have throw rugs and other things on the floor that can make you trip. What can I do in the kitchen? Clean up any spills right away. Avoid walking on wet floors. Keep items that you use a lot in easy-to-reach places. If you need to reach something  above you, use a strong step stool that has a grab bar. Keep electrical cords out of the way. Do not use floor polish or wax that makes floors slippery. If you must use wax, use non-skid floor wax. Do not have throw rugs and other things on the floor that can make you trip. What can I do with my stairs? Do not leave any items on the stairs. Make sure that there are handrails on both sides of the stairs and use them. Fix handrails that are broken or loose. Make sure that handrails are as long as the stairways. Check any carpeting to make sure that it is firmly attached to the stairs. Fix any carpet that is loose or worn. Avoid having throw rugs at the top or bottom of the stairs. If you do have throw rugs, attach them to the floor with carpet tape. Make sure that you have a light switch at the top of the stairs and the bottom of the stairs. If you do not have them, ask someone to add them for you. What else  can I do to help prevent falls? Wear shoes that: Do not have high heels. Have rubber bottoms. Are comfortable and fit you well. Are closed at the toe. Do not wear sandals. If you use a stepladder: Make sure that it is fully opened. Do not climb a closed stepladder. Make sure that both sides of the stepladder are locked into place. Ask someone to hold it for you, if possible. Clearly mark and make sure that you can see: Any grab bars or handrails. First and last steps. Where the edge of each step is. Use tools that help you move around (mobility aids) if they are needed. These include: Canes. Walkers. Scooters. Crutches. Turn on the lights when you go into a dark area. Replace any light bulbs as soon as they burn out. Set up your furniture so you have a clear path. Avoid moving your furniture around. If any of your floors are uneven, fix them. If there are any pets around you, be aware of where they are. Review your medicines with your doctor. Some medicines can make you feel dizzy. This can increase your chance of falling. Ask your doctor what other things that you can do to help prevent falls. This information is not intended to replace advice given to you by your health care provider. Make sure you discuss any questions you have with your health care provider. Document Released: 01/16/2009 Document Revised: 08/28/2015 Document Reviewed: 04/26/2014 Elsevier Interactive Patient Education  2017 [employer].

## 2022-06-28 NOTE — Progress Notes (Addendum)
I connected with  Turner Eckart Bultema on 06/28/22 by a audio enabled telemedicine application and verified that I am speaking with the correct person using two identifiers.  Patient Location: Home  Provider Location: Office/Clinic  I discussed the limitations of evaluation and management by telemedicine. The patient expressed understanding and agreed to proceed.    Patient Medicare AWV questionnaire was completed by the patient on 06/27/22; I have confirmed that all information answered by patient is correct and no changes since this date.      Subjective:   STRIKER SAHR is a 72 y.o. male who presents for Medicare Annual/Subsequent preventive examination.  Review of Systems     Cardiac Risk Factors include: advanced age (>48men, >44 women);hypertension;male gender     Objective:    Today's Vitals   06/28/22 1007  Weight: 170 lb 9.6 oz (77.4 kg)   Body mass index is 25.94 kg/m.     06/28/2022   10:12 AM 06/24/2022   10:22 AM 04/13/2022    7:32 PM 06/19/2021    9:44 AM 12/23/2015    4:46 PM 12/23/2015    9:24 AM 12/18/2014   10:21 AM  Advanced Directives  Does Patient Have a Medical Advance Directive? No No No No  No No  Does patient want to make changes to medical advance directive?    Yes (MAU/Ambulatory/Procedural Areas - Information given)     Would patient like information on creating a medical advance directive? No - Patient declined No - Patient declined   No - patient declined information No - patient declined information     Current Medications (verified) Outpatient Encounter Medications as of 06/28/2022  Medication Sig   Acetylcarnitine HCl (ACETYL L-CARNITINE PO) Take by mouth.   Ascorbic Acid (VITAMIN C) 1000 MG tablet    aspirin EC 325 MG tablet Take 325 mg by mouth daily.   buPROPion (WELLBUTRIN XL) 300 MG 24 hr tablet TAKE 1 TABLET BY MOUTH DAILY   Cholecalciferol (VITAMIN D) 2000 UNITS CAPS Take 1 capsule by mouth daily.   clonazePAM (KLONOPIN) 0.5 MG tablet  Take 1 tablet (0.5 mg total) by mouth 2 (two) times daily as needed for anxiety (do not drive for 12 hours after taking).   diltiazem (CARDIZEM CD) 180 MG 24 hr capsule TAKE 1 CAPSULE BY MOUTH DAILY   gabapentin (NEURONTIN) 300 MG capsule Take 3 capsules (900 mg total) by mouth at bedtime.   hydrochlorothiazide (HYDRODIURIL) 25 MG tablet Take 1 tablet (25 mg total) by mouth daily.   HYDROcodone-acetaminophen (NORCO/VICODIN) 5-325 MG tablet Take 0.5-1 tablets by mouth 2 (two) times daily as needed. Do not use indicated instructions more than one to three times per week.   methylcellulose (CITRUCEL) oral powder Use as directed once daily   potassium chloride (KLOR-CON) 10 MEQ tablet TAKE 3 TABLETS BY MOUTH IN  THE MORNING AND 2 TABLETS  BY MOUTH IN THE AFTERNOON   rosuvastatin (CRESTOR) 5 MG tablet Take 1 tablet (5 mg total) by mouth daily.   [DISCONTINUED] NEEDLE, DISP, 22 G 22G X 1" MISC Used to inject testosterone weekly (Patient not taking: Reported on 06/24/2022)   [DISCONTINUED] omeprazole (PRILOSEC) 40 MG capsule Take 1 capsule (40 mg total) by mouth daily.   No facility-administered encounter medications on file as of 06/28/2022.    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   Cataract    forming   Depressive disorder, not elsewhere classified    Diverticulitis    X 1 EPISODE   Esophageal reflux    past hx    HTN (hypertension)    Hyperlipidemia    CONTROLLED   Lymphocytic colitis    Neuromuscular disorder (HCC)    neuropathy   Neuropathy    Other testicular hypofunction    Palpitations    Personal history of urinary calculi    LAST IN THE 80'S    Past Surgical History:  Procedure Laterality Date   COLONOSCOPY     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   [name]   parotid tumor surgery     [place]- BENIGN   POLYPECTOMY     TONSILLECTOMY AND ADENOIDECTOMY     Family History  Problem Relation Age of Onset   Hypertension Mother    Anxiety disorder Mother    Non-Hodgkin's lymphoma Mother    Dementia Father    Depression Father        and anxiety   Hypertension Father    Sudden death Paternal Uncle 75   Colon cancer Paternal Uncle    Hypertension Brother    Early death Neg Hx    Heart disease Neg Hx        mother in 60s had lesoin   Colon polyps Neg Hx    Esophageal cancer Neg Hx    Rectal cancer Neg Hx    Stomach cancer Neg Hx    Social History   Socioeconomic History   Marital status: Married    Spouse name: Not on file   Number of children: 0   Years of education: 18   Highest education level: Not on file  Occupational History   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 Married 30 + years. No children, lots of critters.       Cancer Survivor- carcinoid lung cancer.   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 Martin Majestic back to work for ability to be insured.       Hobbies: music, home repair/improvement, cats   Social Determinants of Health   Financial Resource Strain: Low Risk  (06/19/2021)   Overall Financial Resource  Strain (CARDIA)    Difficulty of Paying Living Expenses: Not hard at all  Food Insecurity: No Food Insecurity (06/27/2022)   Hunger Vital Sign    Worried About Running Out of Food in the Last Year: Never true    Ran Out of Food in the Last Year: Never true  Transportation Needs: No Transportation Needs (06/27/2022)  PRAPARE - Hydrologist (Medical): No    Lack of Transportation (Non-Medical): No  Physical Activity: Sufficiently Active (06/27/2022)   Exercise Vital Sign    Days of Exercise per Week: 5 days    Minutes of Exercise per Session: 50 min  Stress: No Stress Concern Present (06/27/2022)   [place]    Feeling of Stress : Only a little  Social Connections: Unknown (06/27/2022)   Social Connection and Isolation Panel [NHANES]    Frequency of Communication with Friends and Family: Patient declined    Frequency of Social Gatherings with Friends and Family: Patient declined    Attends Religious Services: Never    Marine scientist or Organizations: No    Attends Music therapist: Never    Marital Status: Married    Tobacco Counseling Counseling given: Not Answered Tobacco comments: exposed to second hand smoke alot when he was younger   Clinical Intake:  Pre-visit preparation completed: Yes  Pain : No/denies pain     BMI - recorded: 25.94 Nutritional Status: BMI 25 -29 Overweight Nutritional Risks: None Diabetes: No  How often do you need to have someone help you when you read instructions, pamphlets, or other written materials from your doctor or pharmacy?: (P) 1 - Never  Diabetic?no  Interpreter Needed?: No  Information entered by :: Charlott Rakes, LPN   Activities of Daily Living    06/27/2022    3:40 PM  In your present state of health, do you have any difficulty performing the following activities:  Hearing? 1  Comment hard of hearing   Vision? 1  Comment related to stroke  Difficulty concentrating or making decisions? 1  Walking or climbing stairs? 1  Dressing or bathing? 0  Doing errands, shopping? 0  Preparing Food and eating ? N  Using the Toilet? N  In the past six months, have you accidently leaked urine? Y  Do you have problems with loss of bowel control? N  Managing your Medications? N  Managing your Finances? N  Housekeeping or managing your Housekeeping? N    Patient Care Team: Marin Olp, MD as PCP - General (Family Medicine) Vickie Epley, MD as PCP - Electrophysiology (Cardiology) Elayne Snare, MD as Consulting Physician (Endocrinology)  Indicate any recent Medical Services you may have received from other than [facility] providers in the past year (date may be approximate).     Assessment:   This is a routine wellness examination for [place].  Hearing/Vision screen Hearing Screening - Comments:: Pt stated slight hearing loss  Vision Screening - Comments:: Pt follows up with Dr Satira Sark for annual eye exams   Dietary issues and exercise activities discussed: Current Exercise Habits: Home exercise routine, Type of exercise: treadmill, Time (Minutes): 50, Frequency (Times/Week): 5, Weekly Exercise (Minutes/Week): 250   Goals Addressed             This Visit's Progress    Patient Stated       Weight loss and exercise        Depression Screen    06/28/2022   10:11 AM 06/16/2022   10:11 AM 02/11/2022    4:50 PM 12/22/2021    8:22 AM 08/26/2021   10:14 AM 06/19/2021    9:42 AM 04/27/2021   10:00 AM  PHQ 2/9 Scores  PHQ - 2 Score 0 2 0 2 2 1 1   PHQ- 9 Score 0 5 2  10 9  5     Fall Risk    06/27/2022    3:40 PM 06/16/2022   10:09 AM 06/19/2021    9:45 AM 12/23/2020   10:47 AM 06/17/2020    2:49 PM  Fall Risk   Falls in the past year? 1 0 1 1 1   Number falls in past yr: 1 0 1 1 1   Comment    Pt reports 5 falls.   Injury with Fall? 1 0 0 0 1  Risk for fall due to : Impaired  vision;Impaired balance/gait No Fall Risks Impaired vision;Impaired balance/gait;Impaired mobility    Risk for fall due to: Comment   with neuropathy    Follow up Falls prevention discussed Falls evaluation completed Falls prevention discussed      FALL RISK PREVENTION PERTAINING TO THE HOME:  Any stairs in or around the home? Yes  If so, are there any without handrails? No  Home free of loose throw rugs in walkways, pet beds, electrical cords, etc? Yes  Adequate lighting in your home to reduce risk of falls? Yes   ASSISTIVE DEVICES UTILIZED TO PREVENT FALLS:  Life alert? No  Use of a cane, walker or w/c? No  Grab bars in the bathroom? Yes  Shower chair or bench in shower? No  Elevated toilet seat or a handicapped toilet? No   TIMED UP AND GO:  Was the test performed? No .  Cognitive Function:      11/08/2016    9:41 AM  Montreal Cognitive Assessment   Visuospatial/ Executive (0/5) 4  Naming (0/3) 3  Attention: Read list of digits (0/2) 1  Attention: Read list of letters (0/1) 1  Attention: Serial 7 subtraction starting at 100 (0/3) 3  Language: Repeat phrase (0/2) 2  Language : Fluency (0/1) 0  Abstraction (0/2) 2  Delayed Recall (0/5) 5  Orientation (0/6) 6  Total 27  Adjusted Score (based on education) 27      06/28/2022   10:15 AM 06/19/2021    9:48 AM  6CIT Screen  What Year? 0 points 0 points  What month? 0 points 0 points  What time? 0 points 0 points  Count back from 20 0 points 0 points  Months in reverse 0 points 0 points  Repeat phrase 0 points 0 points  Total Score 0 points 0 points    Immunizations Immunization History  Administered Date(s) Administered   COVID-19, mRNA, vaccine(Comirnaty)12 years and older 03/23/2022   Fluad Quad(high Dose 65+) 01/16/2019, 01/28/2020, 01/25/2021   Influenza Whole 01/04/2012   Influenza, High Dose Seasonal PF 12/21/2017, 01/21/2022   Influenza,inj,Quad PF,6+ Mos 12/17/2014   Influenza-Unspecified 01/17/2014,  12/28/2015, 01/01/2017   PFIZER Comirnaty(Gray Top)Covid-19 Tri-Sucrose Vaccine 08/18/2020   PFIZER(Purple Top)SARS-COV-2 Vaccination 04/19/2019, 05/14/2019, 12/31/2019, 12/25/2020   Pfizer Covid-19 Vaccine Bivalent Booster 33yrs & up 12/23/2020   Pneumococcal Conjugate-13 06/17/2015   Pneumococcal Polysaccharide-23 09/07/2016   Td 02/27/2001, 04/10/2011   Tdap 05/28/2021   Zoster Recombinat (Shingrix) 04/21/2021, 08/26/2021   Zoster, Live 08/24/2010    TDAP status: Up to date  Flu Vaccine status: Up to date  Pneumococcal vaccine status: Up to date  Covid-19 vaccine status: Completed vaccines  Qualifies for Shingles Vaccine? Yes   Zostavax completed Yes   Shingrix Completed?: Yes  Screening Tests Health Maintenance  Topic Date Due   COVID-19 Vaccine (7 - 2023-24 season) 07/02/2022 (Originally 05/18/2022)   Medicare Annual Wellness (AWV)  06/28/2023   COLONOSCOPY (Pts 45-22yrs Insurance coverage will need  to be confirmed)  02/24/2031   DTaP/Tdap/Td (4 - Td or Tdap) 05/29/2031   Pneumonia Vaccine 29+ Years old  Completed   INFLUENZA VACCINE  Completed   Hepatitis C Screening  Completed   Zoster Vaccines- Shingrix  Completed   HPV VACCINES  Aged Out    Health Maintenance  There are no preventive care reminders to display for this patient.   Colorectal cancer screening: Type of screening: Colonoscopy. Completed 02/23/21. Repeat every 10 years   Additional Screening:  Hepatitis C Screening:  Completed 04/19/22  Vision Screening: Recommended annual ophthalmology exams for early detection of glaucoma and other disorders of the eye. Is the patient up to date with their annual eye exam?  Yes  Who is the provider or what is the name of the office in which the patient attends annual eye exams? Dr Satira Sark  If pt is not established with a provider, would they like to be referred to a provider to establish care? No .   Dental Screening: Recommended annual dental exams for proper  oral hygiene  Community Resource Referral / Chronic Care Management: CRR required this visit?  No   CCM required this visit?  No      Plan:     I have personally reviewed and noted the following in the patient's chart:   Medical and social history Use of alcohol, tobacco or illicit drugs  Current medications and supplements including opioid prescriptions. Patient is not currently taking opioid prescriptions. Functional ability and status Nutritional status Physical activity Advanced directives List of other physicians Hospitalizations, surgeries, and ER visits in previous 12 months Vitals Screenings to include cognitive, depression, and falls Referrals and appointments  In addition, I have reviewed and discussed with patient certain preventive protocols, quality metrics, and best practice recommendations. A written personalized care plan for preventive services as well as general preventive health recommendations were provided to patient.     Willette Brace, LPN   [id number]   Nurse Notes: none

## 2022-06-29 NOTE — Therapy (Signed)
OUTPATIENT PHYSICAL THERAPY VESTIBULAR TREATMENT     Patient Name: [name] Page MRN: [id number] DOB:Dec 23, 1950, 72 y.o., male Today's Date: 06/30/2022  END OF SESSION:  PT End of Session - 06/30/22 1100     Visit Number 2    Number of Visits 13    Date for PT Re-Evaluation 08/06/22    Authorization Type [employer] Medicare    Progress Note Due on Visit 10    PT Start Time 1024   pt late   PT Stop Time 1057    PT Time Calculation (min) 33 min    Equipment Utilized During Treatment Gait belt    Activity Tolerance Patient tolerated treatment well    Behavior During Therapy WFL for tasks assessed/performed              Past Medical History:  Diagnosis Date   Allergic rhinitis, cause unspecified    MILD   Anxiety    Arthritis    OA   Cancer ([place])    lung tumor - NO CHEMO NO RADIATION   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   Cataract    forming   Depressive disorder, not elsewhere classified    Diverticulitis    X 1 EPISODE   Esophageal reflux    past hx    HTN (hypertension)    Hyperlipidemia    CONTROLLED   Lymphocytic colitis    Neuromuscular disorder (HCC)    neuropathy   Neuropathy    Other testicular hypofunction    Palpitations    Personal history of urinary calculi    LAST IN THE 80'S   Past Surgical History:  Procedure Laterality Date   COLONOSCOPY     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   [name]   parotid tumor surgery     [place]- BENIGN   POLYPECTOMY     TONSILLECTOMY AND ADENOIDECTOMY     Patient Active Problem List   Diagnosis Date Noted   Recurrent major depressive disorder, in partial remission ([place]) 06/16/2022   Vitamin D deficiency 02/11/2022   Aortic atherosclerosis ([place]) 11/03/2020   Chronic bilateral low back pain 06/05/2019   Venous stasis of both lower extremities 06/05/2019   History of adenomatous polyp of colon 03/24/2018   Mastoiditis 03/09/2017   Chronic pain  syndrome 08/31/2016   Palpitations 08/11/2016   Eustachian tube dysfunction 12/17/2014   Generalized anxiety disorder 03/12/2014   Essential hypertension 03/12/2014   SI (sacroiliac) joint dysfunction 09/19/2013   Nonallopathic lesion of sacral region 09/19/2013   Nonallopathic lesion of thoracic region 09/19/2013   Nonallopathic lesion of lumbosacral region 09/19/2013   Peripheral neuropathy, idiopathic 12/22/2012   History of lung cancer 12/11/2012   Dyspnea 07/14/2012   Nonspecific abnormal electrocardiogram (ECG) (EKG) 07/14/2012   Hypertriglyceridemia 07/14/2012   Allergic reaction, history of 10/25/2011   Rectal or anal pain 10/01/2010   Depression 02/16/2010   Low testosterone 11/21/2007   Allergic rhinitis 06/13/2007   GERD 06/13/2007   IBS (irritable bowel syndrome) 06/13/2007    PCP: Marin Olp, MD  REFERRING PROVIDER: Marin Olp, MD   REFERRING DIAG:  R42 (ICD-10-CM) - Vertigo  R26.89 (ICD-10-CM) - Balance disorder    THERAPY DIAG:  Dizziness and giddiness  Unsteadiness on feet  ONSET DATE: 06/16/2022 (MD referral); started 12/2021  Rationale for Evaluation and Treatment: Rehabilitation  SUBJECTIVE:   SUBJECTIVE STATEMENT: Reports that yesterday he had a lot more vertigo than normal. Occurring  when bending over, looking overhead, laying in bed. Feels that the R eustachian tube feels blocked.   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 Yes: NPRS scale: "low grade"/10 Pain location: feet Pain description: neuropathy pain Aggravating factors: being on feet makes it worse Relieving factors: Gabapentin  PRECAUTIONS: Fall and Other: hx of vertigo, hx of branch retinal artery occlusion, hx of mastoid effusion .  Has loop recorder implanted 2 weeks prior to PT eval  WEIGHT BEARING RESTRICTIONS:  No  FALLS: Has patient fallen in last 6 months? Yes. 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  OBJECTIVE:      TODAY'S TREATMENT: 06/30/22 Activity Comments  L sidelying test Negative; c/o "lightheaded" upon sitting up  R sidelying test Negative; c/o less severe "lightheaded" upon sitting up  R roll test negative  L roll test  Negative   FGA 21/30              HOME EXERCISE PROGRAM Last updated: 06/30/22 Access Code: [email or web address] URL: [email or web address] Date: 06/30/2022 Prepared by: [facility]  Exercises - Romberg Stance with Eyes Closed  - 1 x daily - 5 x weekly - 2-3 sets - 30 sec hold - Tandem Walking with Counter Support  - 1 x daily - 5 x weekly - 2 sets - 10 reps    PATIENT EDUCATION: Education details: provided handout on vestibular neuritis, answered pt's questions on exam findings, benefits of vestibular rehab and typical rehab course; HEP- to be performed at counter top for safety Person educated: Patient Education method: Explanation, Demonstration, Tactile cues, Verbal cues, and Handouts Education comprehension: verbalized understanding and returned demonstration   Below measures were taken at time of initial evaluation unless otherwise specified:   DIAGNOSTIC FINDINGS: MRI brain : 04/13/2022 Other: None.   IMPRESSION: 1. Normal brain MRI for age. No acute intracranial abnormality identified. 2. Moderate to large left mastoid effusion. Finding is of uncertain significance. Correlation with physical exam suggested.   CTA H&N 1/9/024:  FINDINGS: CT HEAD FINDINGS   Brain: No evidence of acute large vascular territory infarction, hemorrhage, hydrocephalus, extra-axial collection or  mass lesion/mass effect.   Vascular: Detailed below.   Skull: No acute fracture.   Sinuses/Orbits: No acute finding.   Other: Small left mastoid effusion.   Review of the MIP images confirms the above findings   CTA NECK FINDINGS   Aortic arch: Great vessel origins are patent without significant stenosis.   Right carotid system: No evidence of dissection, stenosis (50% or greater), or occlusion.   Left carotid system: No evidence of dissection, stenosis (50% or greater), or occlusion.   Vertebral arteries: Left dominant. No evidence of dissection, stenosis (50% or greater), or occlusion.   Skeleton: No acute abnormality.   Other neck: No acute abnormality.   Upper chest: Visualized lung apices are clear.   Review of the MIP images confirms the above findings   CTA HEAD FINDINGS   Anterior circulation: Bilateral intracranial ICAs MCAs, and ACAs are patent without proximal hemodynamically significant stenosis.   Posterior circulation: Bilateral low intradural vertebral arteries, basilar artery, and bilateral posterior cerebral arteries are patent  without proximal hemodynamically significant stenosis.   Venous sinuses: Not well evaluated due to arterial timing.   Review of the MIP images confirms the above findings   IMPRESSION: 1. No evidence of acute intracranial abnormality. 2. No large vessel occlusion or proximal hemodynamically significant stenosis  COGNITION: Overall cognitive status: Within functional limits for tasks assessed   POSTURE:  No Significant postural limitations  Cervical ROM:  WFL active ROM in sitting   TRANSFERS: Assistive device utilized: None  Sit to stand: Modified independence Stand to sit: Modified independence  PATIENT SURVEYS:  FOTO intake score 56; predicted 55  VESTIBULAR ASSESSMENT:  GENERAL OBSERVATION: No acute distress   SYMPTOM BEHAVIOR:  Subjective history: Dizziness occurred in September 2023-was so bad, I  went to ED.  Occurs while looking down, looking up, turning head in bed.  Momentary, but feels like I might fall over.  In January, had visual deficit while working on the computer.    Non-Vestibular symptoms: changes in vision  Type of dizziness: Imbalance (Disequilibrium) and "Funny feeling in the head"  Frequency: several times per day  Duration: seconds  Aggravating factors: Induced by motion: looking up at the ceiling, bending down to the ground, and turning head quickly  Relieving factors: head stationary and previous course of prednisone  Progression of symptoms: better  OCULOMOTOR EXAM:  Ocular Alignment: abnormal and L hypermetria (R is lower)  Ocular ROM: No Limitations  Spontaneous Nystagmus: absent  Gaze-Induced Nystagmus: right beating with right gaze  Smooth Pursuits:  saccadic   Saccades: intact  Convergence/Divergence: 5  cm   VESTIBULAR - OCULAR REFLEX:   Slow VOR: Normal  VOR Cancellation: Normal  Head-Impulse Test: HIT Right: positive HIT Left: negative    POSITIONAL TESTING: Right Dix-Hallpike: no nystagmus and no symtpoms Left Dix-Hallpike: no nystagmus and reports mild dizzinesss/spinning Right Roll Test: no nystagmus Left Roll Test: no nystagmus       FUNCTIONAL TESTS:  M-CTSIB  Condition 1: Firm Surface, EO 30 Sec, Normal Sway  Condition 2: Firm Surface, EC 30 Sec, Mild Sway  Condition 3: Foam Surface, EO 30 Sec, Mild Sway  Condition 4: Foam Surface, EC 30 Sec, moderate Sway     VESTIBULAR TREATMENT:                                                                                                   DATE: 06/24/2022 (treatment not yet initiated)   PATIENT EDUCATION: Education details: Eval results, POC Person educated: Patient Education method: Explanation Education comprehension: verbalized understanding  HOME EXERCISE PROGRAM:  GOALS: Goals reviewed with patient? Yes  SHORT TERM GOALS: Target date: 07/23/2022  Pt will be independent with  HEP for improved dizziness and balance. Baseline: Goal status: IN PROGRESS  2.  FGA to be assessed, and goal to be written as appropriate. Baseline:  Goal status: IN PROGRESS  LONG TERM GOALS: Target date: 08/06/2022  Pt will be independent with HEP for improved dizziness and balance. Baseline:  Goal status: IN PROGRESS  2.  Pt will perform Condition 4 on MCTSIB with mild sway or less, for  improved balance. Baseline: mod sway Goal status: IN PROGRESS  3.  Pt will report 50% improvement in dizziness with functional daily activities. Baseline:  Goal status: IN PROGRESS  4.  Pt to score at least 24/30 on FGA in order to decrease risk of falls.  Baseline: 21 Goal status: INITIAL  ASSESSMENT:  CLINICAL IMPRESSION: Patient arrived to session with report of increased dizziness yesterday with bending over, looking overhead, laying in bed. Also reports R aural fullness. Positional testing was negative; patient noted slight lightheadedness upon sitting up from L>R sidelying. Patient scored 21/30 on FGA, indicating increased risk of falls. Most imbalance evident with gait with EC, narrow BOS. Initiated HEP for balance and provided info on vestibular neuritis as patient's symptoms sound in line with this condition.   OBJECTIVE IMPAIRMENTS: Abnormal gait, decreased balance, difficulty walking, and dizziness.   ACTIVITY LIMITATIONS: bending, squatting, sleeping, bed mobility, reach over head, and locomotion level  PARTICIPATION LIMITATIONS: community activity and fitness  PERSONAL FACTORS: 3+ comorbidities: see above  are also affecting patient's functional outcome.   REHAB POTENTIAL: Good  CLINICAL DECISION MAKING: Evolving/moderate complexity  EVALUATION COMPLEXITY: Moderate   PLAN:  PT FREQUENCY: 1-2x/week  PT DURATION: 6 weeks plus eval  PLANNED INTERVENTIONS: Therapeutic exercises, Therapeutic activity, Neuromuscular re-education, Balance training, Gait training,  Patient/Family education, Self Care, Vestibular training, and Canalith repositioning  PLAN FOR NEXT SESSION: Review HEP for balance   Janene Harvey, PT, DPT 06/30/22 11:02 AM  [facility] at [facility] [address] [place], [place] [address] Phone # [phone] Fax # [phone]

## 2022-06-30 ENCOUNTER — Encounter: Payer: Self-pay | Admitting: Physical Therapy

## 2022-06-30 ENCOUNTER — Ambulatory Visit: Payer: Medicare Other | Admitting: Physical Therapy

## 2022-06-30 ENCOUNTER — Ambulatory Visit (INDEPENDENT_AMBULATORY_CARE_PROVIDER_SITE_OTHER)
Admission: RE | Admit: 2022-06-30 | Discharge: 2022-06-30 | Disposition: A | Discharge status: Home / Self Care | Payer: Medicare Other | Source: Ambulatory Visit | Attending: Family Medicine | Admitting: Family Medicine

## 2022-06-30 DIAGNOSIS — Z85118 Personal history of other malignant neoplasm of bronchus and lung: Secondary | ICD-10-CM | POA: Diagnosis not present

## 2022-06-30 DIAGNOSIS — M1811 Unilateral primary osteoarthritis of first carpometacarpal joint, right hand: Secondary | ICD-10-CM | POA: Diagnosis not present

## 2022-06-30 DIAGNOSIS — R42 Dizziness and giddiness: Secondary | ICD-10-CM | POA: Diagnosis not present

## 2022-06-30 DIAGNOSIS — M1812 Unilateral primary osteoarthritis of first carpometacarpal joint, left hand: Secondary | ICD-10-CM | POA: Diagnosis not present

## 2022-06-30 DIAGNOSIS — R2681 Unsteadiness on feet: Secondary | ICD-10-CM | POA: Diagnosis not present

## 2022-06-30 DIAGNOSIS — G5603 Carpal tunnel syndrome, bilateral upper limbs: Secondary | ICD-10-CM | POA: Diagnosis not present

## 2022-06-30 DIAGNOSIS — M180 Bilateral primary osteoarthritis of first carpometacarpal joints: Secondary | ICD-10-CM | POA: Diagnosis not present

## 2022-06-30 DIAGNOSIS — R079 Chest pain, unspecified: Secondary | ICD-10-CM | POA: Diagnosis not present

## 2022-06-30 DIAGNOSIS — R2689 Other abnormalities of gait and mobility: Secondary | ICD-10-CM | POA: Diagnosis not present

## 2022-07-05 NOTE — Therapy (Signed)
OUTPATIENT PHYSICAL THERAPY VESTIBULAR TREATMENT     Patient Name: [name] Page MRN: [id number] DOB:11-25-1950, 72 y.o., male Today's Date: 07/06/2022  END OF SESSION:  PT End of Session - 07/06/22 1142     Visit Number 3    Number of Visits 13    Date for PT Re-Evaluation 08/06/22    Authorization Type UHC Medicare    Progress Note Due on Visit 10    PT Start Time 1104    PT Stop Time 1142    PT Time Calculation (min) 38 min    Equipment Utilized During Treatment Gait belt    Activity Tolerance Patient tolerated treatment well    Behavior During Therapy WFL for tasks assessed/performed               Past Medical History:  Diagnosis Date   Allergic rhinitis, cause unspecified    MILD   Anxiety    Arthritis    OA   Cancer    lung tumor - NO CHEMO NO RADIATION   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   Cataract    forming   Depressive disorder, not elsewhere classified    Diverticulitis    X 1 EPISODE   Esophageal reflux    past hx    HTN (hypertension)    Hyperlipidemia    CONTROLLED   Lymphocytic colitis    Neuromuscular disorder    neuropathy   Neuropathy    Other testicular hypofunction    Palpitations    Personal history of urinary calculi    LAST IN THE 80'S   Past Surgical History:  Procedure Laterality Date   COLONOSCOPY     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   [name]   parotid tumor surgery     [place]- BENIGN   POLYPECTOMY     TONSILLECTOMY AND ADENOIDECTOMY     Patient Active Problem List   Diagnosis Date Noted   Recurrent major depressive disorder, in partial remission 06/16/2022   Vitamin D deficiency 02/11/2022   Aortic atherosclerosis 11/03/2020   Chronic bilateral low back pain 06/05/2019   Venous stasis of both lower extremities 06/05/2019   History of adenomatous polyp of colon 03/24/2018   Mastoiditis 03/09/2017   Chronic pain syndrome 08/31/2016   Palpitations  08/11/2016   Eustachian tube dysfunction 12/17/2014   Generalized anxiety disorder 03/12/2014   Essential hypertension 03/12/2014   SI (sacroiliac) joint dysfunction 09/19/2013   Nonallopathic lesion of sacral region 09/19/2013   Nonallopathic lesion of thoracic region 09/19/2013   Nonallopathic lesion of lumbosacral region 09/19/2013   Peripheral neuropathy, idiopathic 12/22/2012   History of lung cancer 12/11/2012   Dyspnea 07/14/2012   Nonspecific abnormal electrocardiogram (ECG) (EKG) 07/14/2012   Hypertriglyceridemia 07/14/2012   Allergic reaction, history of 10/25/2011   Rectal or anal pain 10/01/2010   Depression 02/16/2010   Low testosterone 11/21/2007   Allergic rhinitis 06/13/2007   GERD 06/13/2007   IBS (irritable bowel syndrome) 06/13/2007    PCP: Marin Olp, MD  REFERRING PROVIDER: Marin Olp, MD   REFERRING DIAG:  R42 (ICD-10-CM) - Vertigo  R26.89 (ICD-10-CM) - Balance disorder    THERAPY DIAG:  Dizziness and giddiness  Unsteadiness on feet  ONSET DATE: 06/16/2022 (MD referral); started 12/2021  Rationale for Evaluation and Treatment: Rehabilitation  SUBJECTIVE:   SUBJECTIVE STATEMENT: Balance has been all over the place. Had 1 episode of dizziness when looking down this AM- haven't  been that bad recently. Doing HEP- don't know if I see improvements.   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 Yes: NPRS scale: "8"/10 Pain location: feet Pain description: neuropathy pain Aggravating factors: being on feet makes it worse Relieving factors: Gabapentin  PRECAUTIONS: Fall and Other: hx of vertigo, hx of branch retinal artery occlusion, hx of mastoid effusion .  Has loop recorder implanted 2 weeks prior to PT eval  WEIGHT BEARING RESTRICTIONS: No  FALLS: Has patient fallen in last 6 months?  Yes. 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  OBJECTIVE:     TODAY'S TREATMENT: 07/06/22 Activity Comments  review HEP: romberg EC tandem walk  Moderate-severe instability with tandem walk; encouraged using counter top; clarified activities d/t patient not performing as instructed   standing VOR horizontal/vertical 2x 30" C/o mild dizziness/head pressure; encouraged quickness of movement to bring on mild sx; more sx horizontal rather than vertical   diagonals using ball to cone on floor 10x each side  C/o mild dizziness but good stability; more intense to L upward diagonal; cues to increase speed   1/2 turns to targets, then with additional toe taps to cone Good speed and no dizziness; however required occasional min A For stability   brandt daroff R/L Asymptomatic to R, c/o mild dizziness upon laying to L side without nystagmus   HIT Positive L>R      PATIENT EDUCATION: Education details: readminstered HEP- pt reports that he may have lost it; HEP update Person educated: Patient Education method: Explanation, Demonstration, Tactile cues, Verbal cues, and Handouts Education comprehension: verbalized understanding and returned demonstration   HOME EXERCISE PROGRAM Last updated: 07/06/22 Access Code: [email or web address] URL: [email or web address] Date: 07/06/2022 Prepared by: [facility]  Exercises - Romberg Stance with Eyes Closed  - 1 x daily - 5 x weekly - 2-3 sets - 30 sec hold - Tandem Walking with Counter Support  - 1 x daily - 5 x weekly - 2 sets - 5 reps - Standing Gaze Stabilization with Head Rotation  - 1 x daily - 5 x weekly - 2-3 sets - 30 sec hold - Standing Gaze Stabilization with Head Rotation  - 1 x daily - 5 x weekly  - 2-3 sets - 30 sec hold - Standing Diagonal Chops with Medicine Ball  - 1 x daily - 5 x weekly - 2 sets - 10 reps - Brandt-Daroff Vestibular Exercise  - 1 x daily - 5 x weekly - 2 sets - 3-5 reps      Below measures were taken at time of initial evaluation unless otherwise specified:   DIAGNOSTIC FINDINGS: MRI brain : 04/13/2022 Other: None.   IMPRESSION: 1. Normal brain MRI for age. No acute intracranial abnormality identified. 2. Moderate to large left mastoid effusion. Finding is of uncertain significance. Correlation with physical exam suggested.   CTA H&N 1/9/024:  FINDINGS: CT HEAD FINDINGS   Brain: No evidence of acute large vascular territory infarction, hemorrhage, hydrocephalus, extra-axial collection or mass lesion/mass effect.   Vascular: Detailed below.   Skull: No acute fracture.   Sinuses/Orbits: No acute finding.   Other: Small  left mastoid effusion.   Review of the MIP images confirms the above findings   CTA NECK FINDINGS   Aortic arch: Great vessel origins are patent without significant stenosis.   Right carotid system: No evidence of dissection, stenosis (50% or greater), or occlusion.   Left carotid system: No evidence of dissection, stenosis (50% or greater), or occlusion.   Vertebral arteries: Left dominant. No evidence of dissection, stenosis (50% or greater), or occlusion.   Skeleton: No acute abnormality.   Other neck: No acute abnormality.   Upper chest: Visualized lung apices are clear.   Review of the MIP images confirms the above findings   CTA HEAD FINDINGS   Anterior circulation: Bilateral intracranial ICAs MCAs, and ACAs are patent without proximal hemodynamically significant stenosis.   Posterior circulation: Bilateral low intradural vertebral arteries, basilar artery, and bilateral posterior cerebral arteries are patent without proximal hemodynamically significant stenosis.   Venous sinuses: Not well evaluated due  to arterial timing.   Review of the MIP images confirms the above findings   IMPRESSION: 1. No evidence of acute intracranial abnormality. 2. No large vessel occlusion or proximal hemodynamically significant stenosis  COGNITION: Overall cognitive status: Within functional limits for tasks assessed   POSTURE:  No Significant postural limitations  Cervical ROM:  WFL active ROM in sitting   TRANSFERS: Assistive device utilized: None  Sit to stand: Modified independence Stand to sit: Modified independence  PATIENT SURVEYS:  FOTO intake score 56; predicted 55  VESTIBULAR ASSESSMENT:  GENERAL OBSERVATION: No acute distress   SYMPTOM BEHAVIOR:  Subjective history: Dizziness occurred in September 2023-was so bad, I went to ED.  Occurs while looking down, looking up, turning head in bed.  Momentary, but feels like I might fall over.  In January, had visual deficit while working on the computer.    Non-Vestibular symptoms: changes in vision  Type of dizziness: Imbalance (Disequilibrium) and "Funny feeling in the head"  Frequency: several times per day  Duration: seconds  Aggravating factors: Induced by motion: looking up at the ceiling, bending down to the ground, and turning head quickly  Relieving factors: head stationary and previous course of prednisone  Progression of symptoms: better  OCULOMOTOR EXAM:  Ocular Alignment: abnormal and L hypermetria (R is lower)  Ocular ROM: No Limitations  Spontaneous Nystagmus: absent  Gaze-Induced Nystagmus: right beating with right gaze  Smooth Pursuits:  saccadic   Saccades: intact  Convergence/Divergence: 5  cm   VESTIBULAR - OCULAR REFLEX:   Slow VOR: Normal  VOR Cancellation: Normal  Head-Impulse Test: HIT Right: positive HIT Left: negative    POSITIONAL TESTING: Right Dix-Hallpike: no nystagmus and no symtpoms Left Dix-Hallpike: no nystagmus and reports mild dizzinesss/spinning Right Roll Test: no nystagmus Left Roll Test:  no nystagmus       FUNCTIONAL TESTS:  M-CTSIB  Condition 1: Firm Surface, EO 30 Sec, Normal Sway  Condition 2: Firm Surface, EC 30 Sec, Mild Sway  Condition 3: Foam Surface, EO 30 Sec, Mild Sway  Condition 4: Foam Surface, EC 30 Sec, moderate Sway     VESTIBULAR TREATMENT:  DATE: 06/24/2022 (treatment not yet initiated)   PATIENT EDUCATION: Education details: Eval results, POC Person educated: Patient Education method: Explanation Education comprehension: verbalized understanding  HOME EXERCISE PROGRAM:  GOALS: Goals reviewed with patient? Yes  SHORT TERM GOALS: Target date: 07/23/2022  Pt will be independent with HEP for improved dizziness and balance. Baseline: Goal status: IN PROGRESS  2.  FGA to be assessed, and goal to be written as appropriate. Baseline:  Goal status: IN PROGRESS  LONG TERM GOALS: Target date: 08/06/2022  Pt will be independent with HEP for improved dizziness and balance. Baseline:  Goal status: IN PROGRESS  2.  Pt will perform Condition 4 on MCTSIB with mild sway or less, for improved balance. Baseline: mod sway Goal status: IN PROGRESS  3.  Pt will report 50% improvement in dizziness with functional daily activities. Baseline:  Goal status: IN PROGRESS  4.  Pt to score at least 24/30 on FGA in order to decrease risk of falls.  Baseline: 21 Goal status: IN PROGRESS  ASSESSMENT:  CLINICAL IMPRESSION: Patient arrived to session with report of continued difficulty with balance. Reviewed HEP and provided clarification as patient was not performing as instructed. Initiated gaze stability and habituation activities which revealed more symptoms in horizontal and L directions. Instructed on intended level of symptoms and safety with activities. Patient reported understanding of all edu provided and without complaints at end of session.     OBJECTIVE IMPAIRMENTS: Abnormal gait, decreased balance, difficulty walking, and dizziness.   ACTIVITY LIMITATIONS: bending, squatting, sleeping, bed mobility, reach over head, and locomotion level  PARTICIPATION LIMITATIONS: community activity and fitness  PERSONAL FACTORS: 3+ comorbidities: see above  are also affecting patient's functional outcome.   REHAB POTENTIAL: Good  CLINICAL DECISION MAKING: Evolving/moderate complexity  EVALUATION COMPLEXITY: Moderate   PLAN:  PT FREQUENCY: 1-2x/week  PT DURATION: 6 weeks plus eval  PLANNED INTERVENTIONS: Therapeutic exercises, Therapeutic activity, Neuromuscular re-education, Balance training, Gait training, Patient/Family education, Self Care, Vestibular training, and Canalith repositioning  PLAN FOR NEXT SESSION: Review HEP for balance, habituation and VOR   Janene Harvey, PT, DPT 07/06/22 11:43 AM  [facility] at [facility] [address], [place], [place] [address] Phone # [phone] Fax # [phone]

## 2022-07-06 ENCOUNTER — Encounter: Payer: Self-pay | Admitting: Physical Therapy

## 2022-07-06 ENCOUNTER — Ambulatory Visit: Payer: Medicare Other | Attending: Family Medicine | Admitting: Physical Therapy

## 2022-07-06 DIAGNOSIS — R2681 Unsteadiness on feet: Secondary | ICD-10-CM | POA: Insufficient documentation

## 2022-07-06 DIAGNOSIS — R42 Dizziness and giddiness: Secondary | ICD-10-CM | POA: Insufficient documentation

## 2022-07-07 NOTE — Therapy (Signed)
OUTPATIENT PHYSICAL THERAPY VESTIBULAR TREATMENT     Patient Name: [name] Page MRN: [id number] DOB:[birth year], 72 y.o., male Today's Date: 07/08/2022  END OF SESSION:  PT End of Session - 07/08/22 1145     Visit Number 4    Number of Visits 13    Date for PT Re-Evaluation 08/06/22    Authorization Type [employer] Medicare    Progress Note Due on Visit 10    PT Start Time 1101    PT Stop Time 1143    PT Time Calculation (min) 42 min    Equipment Utilized During Treatment Gait belt    Activity Tolerance Patient tolerated treatment well    Behavior During Therapy WFL for tasks assessed/performed                Past Medical History:  Diagnosis Date   Allergic rhinitis, cause unspecified    MILD   Anxiety    Arthritis    OA   Cancer    lung tumor - NO CHEMO NO RADIATION   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   Cataract    forming   Depressive disorder, not elsewhere classified    Diverticulitis    X 1 EPISODE   Esophageal reflux    past hx    HTN (hypertension)    Hyperlipidemia    CONTROLLED   Lymphocytic colitis    Neuromuscular disorder    neuropathy   Neuropathy    Other testicular hypofunction    Palpitations    Personal history of urinary calculi    LAST IN THE 80'S   Past Surgical History:  Procedure Laterality Date   COLONOSCOPY     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   [name]   parotid tumor surgery     [place]- BENIGN   POLYPECTOMY     TONSILLECTOMY AND ADENOIDECTOMY     Patient Active Problem List   Diagnosis Date Noted   Recurrent major depressive disorder, in partial remission 06/16/2022   Vitamin D deficiency 02/11/2022   Aortic atherosclerosis 11/03/2020   Chronic bilateral low back pain 06/05/2019   Venous stasis of both lower extremities 06/05/2019   History of adenomatous polyp of colon 03/24/2018   Mastoiditis 03/09/2017   Chronic pain syndrome 08/31/2016    Palpitations 08/11/2016   Eustachian tube dysfunction 12/17/2014   Generalized anxiety disorder 03/12/2014   Essential hypertension 03/12/2014   SI (sacroiliac) joint dysfunction 09/19/2013   Nonallopathic lesion of sacral region 09/19/2013   Nonallopathic lesion of thoracic region 09/19/2013   Nonallopathic lesion of lumbosacral region 09/19/2013   Peripheral neuropathy, idiopathic 12/22/2012   History of lung cancer 12/11/2012   Dyspnea 07/14/2012   Nonspecific abnormal electrocardiogram (ECG) (EKG) 07/14/2012   Hypertriglyceridemia 07/14/2012   Allergic reaction, history of 10/25/2011   Rectal or anal pain 10/01/2010   Depression 02/16/2010   Low testosterone 11/21/2007   Allergic rhinitis 06/13/2007   GERD 06/13/2007   IBS (irritable bowel syndrome) 06/13/2007    PCP: Marin Olp, MD  REFERRING PROVIDER: Marin Olp, MD   REFERRING DIAG:  R42 (ICD-10-CM) - Vertigo  R26.89 (ICD-10-CM) - Balance disorder    THERAPY DIAG:  Dizziness and giddiness  Unsteadiness on feet  ONSET DATE: 06/16/2022 (MD referral); started 12/2021  Rationale for Evaluation and Treatment: Rehabilitation  SUBJECTIVE:   SUBJECTIVE STATEMENT: "I'm a little bit stiff. My back is bothering me today." Doing pretty good with HEP except  for the one that makes me bend over.  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 Yes: NPRS scale: 2-3/10 Pain location: LB Pain description: stiff Aggravating factors: bending forward Relieving factors: rest  PRECAUTIONS: Fall and Other: hx of vertigo, hx of branch retinal artery occlusion, hx of mastoid effusion .  Has loop recorder implanted 2 weeks prior to PT eval  WEIGHT BEARING RESTRICTIONS: No  FALLS: Has patient fallen in last 6 months? Yes. Number of falls 1 Usually, fall and begin to catch  myself (daily)  LIVING ENVIRONMENT: Lives with: lives with their spouse Lives in: House/apartment   PLOF: Independent.  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 C/o mild dizziness   brandt daroff  3/10 dizziness  fwd/back stepping + head nods  2x5 each LE 1 UE support; noted mild dizziness looking down; required occasional min A d/t imbalance   1/2 turns to targets + toe taps  Occasional min A d/t imbalance  romberg on foam + head turns/nods 2x30" each Moderate sway; worse with head nods   Wall squat 10x Cueing for proper foot positioning  Alt step ups  With 1 handrail support  Counter top push ups  Cueing to maintain neutral spine; edu on foot positioning to change amount of challenge      PATIENT EDUCATION: Education details: encouraged taking a break from diagonals d/t LBP; edu on how to adjust symptom intensity with each exercise; answered patient's questions on safe strength training; HEP update Person educated: Patient Education method: Explanation, Demonstration, Tactile cues, Verbal cues, and Handouts Education comprehension: verbalized understanding and returned demonstration   HOME EXERCISE PROGRAM Last updated: 07/08/22 Access Code: [email or web address] URL: [email or web address] Date: 07/08/2022 Prepared by: [facility]  Exercises - Romberg Stance with Eyes Closed  - 1 x daily - 5 x weekly - 2-3 sets - 30 sec hold - Tandem Walking with Counter Support  - 1 x daily - 5 x weekly - 2 sets - 5 reps - Standing Gaze Stabilization with Head Rotation  - 1 x daily - 5 x weekly - 2-3 sets - 30 sec hold - Standing Gaze Stabilization with Head Rotation  - 1 x daily - 5 x weekly - 2-3 sets - 30 sec hold - Standing Diagonal Chops with Medicine Ball  - 1 x daily - 5 x weekly -  2 sets - 10 reps - Brandt-Daroff Vestibular Exercise  - 1 x daily - 5 x weekly - 2 sets - 3-5 reps - Wall Squat  - 1 x daily - 5 x weekly - 2-3 sets - 10 reps - Push Up on Table  - 1 x daily - 5 x weekly - 2 sets - 10 reps - Step Up  - 1 x daily - 5 x weekly - 3-4 sets - 10 reps      Below measures were taken at time of initial evaluation unless otherwise specified:   DIAGNOSTIC FINDINGS: MRI brain : 04/13/2022 Other: None.   IMPRESSION: 1. Normal brain MRI for age. No acute intracranial abnormality identified. 2. Moderate to large left mastoid effusion. Finding is of uncertain significance. Correlation with physical exam suggested.   CTA  H&N 1/9/024:  FINDINGS: CT HEAD FINDINGS   Brain: No evidence of acute large vascular territory infarction, hemorrhage, hydrocephalus, extra-axial collection or mass lesion/mass effect.   Vascular: Detailed below.   Skull: No acute fracture.   Sinuses/Orbits: No acute finding.   Other: Small left mastoid effusion.   Review of the MIP images confirms the above findings   CTA NECK FINDINGS   Aortic arch: Great vessel origins are patent without significant stenosis.   Right carotid system: No evidence of dissection, stenosis (50% or greater), or occlusion.   Left carotid system: No evidence of dissection, stenosis (50% or greater), or occlusion.   Vertebral arteries: Left dominant. No evidence of dissection, stenosis (50% or greater), or occlusion.   Skeleton: No acute abnormality.   Other neck: No acute abnormality.   Upper chest: Visualized lung apices are clear.   Review of the MIP images confirms the above findings   CTA HEAD FINDINGS   Anterior circulation: Bilateral intracranial ICAs MCAs, and ACAs are patent without proximal hemodynamically significant stenosis.   Posterior circulation: Bilateral low intradural vertebral arteries, basilar artery, and bilateral posterior cerebral arteries are patent without  proximal hemodynamically significant stenosis.   Venous sinuses: Not well evaluated due to arterial timing.   Review of the MIP images confirms the above findings   IMPRESSION: 1. No evidence of acute intracranial abnormality. 2. No large vessel occlusion or proximal hemodynamically significant stenosis  COGNITION: Overall cognitive status: Within functional limits for tasks assessed   POSTURE:  No Significant postural limitations  Cervical ROM:  WFL active ROM in sitting   TRANSFERS: Assistive device utilized: None  Sit to stand: Modified independence Stand to sit: Modified independence  PATIENT SURVEYS:  FOTO intake score 56; predicted 55  VESTIBULAR ASSESSMENT:  GENERAL OBSERVATION: No acute distress   SYMPTOM BEHAVIOR:  Subjective history: Dizziness occurred in September 2023-was so bad, I went to ED.  Occurs while looking down, looking up, turning head in bed.  Momentary, but feels like I might fall over.  In January, had visual deficit while working on the computer.    Non-Vestibular symptoms: changes in vision  Type of dizziness: Imbalance (Disequilibrium) and "Funny feeling in the head"  Frequency: several times per day  Duration: seconds  Aggravating factors: Induced by motion: looking up at the ceiling, bending down to the ground, and turning head quickly  Relieving factors: head stationary and previous course of prednisone  Progression of symptoms: better  OCULOMOTOR EXAM:  Ocular Alignment: abnormal and L hypermetria (R is lower)  Ocular ROM: No Limitations  Spontaneous Nystagmus: absent  Gaze-Induced Nystagmus: right beating with right gaze  Smooth Pursuits:  saccadic   Saccades: intact  Convergence/Divergence: 5  cm   VESTIBULAR - OCULAR REFLEX:   Slow VOR: Normal  VOR Cancellation: Normal  Head-Impulse Test: HIT Right: positive HIT Left: negative    POSITIONAL TESTING: Right Dix-Hallpike: no nystagmus and no symtpoms Left Dix-Hallpike: no  nystagmus and reports mild dizzinesss/spinning Right Roll Test: no nystagmus Left Roll Test: no nystagmus       FUNCTIONAL TESTS:  M-CTSIB  Condition 1: Firm Surface, EO 30 Sec, Normal Sway  Condition 2: Firm Surface, EC 30 Sec, Mild Sway  Condition 3: Foam Surface, EO 30 Sec, Mild Sway  Condition 4: Foam Surface, EC 30 Sec, moderate Sway     VESTIBULAR TREATMENT:  DATE: 06/24/2022 (treatment not yet initiated)   PATIENT EDUCATION: Education details: Eval results, POC Person educated: Patient Education method: Explanation Education comprehension: verbalized understanding  HOME EXERCISE PROGRAM:  GOALS: Goals reviewed with patient? Yes  SHORT TERM GOALS: Target date: 07/23/2022  Pt will be independent with HEP for improved dizziness and balance. Baseline: Goal status: IN PROGRESS  2.  FGA to be assessed, and goal to be written as appropriate. Baseline:  Goal status: IN PROGRESS  LONG TERM GOALS: Target date: 08/06/2022  Pt will be independent with HEP for improved dizziness and balance. Baseline:  Goal status: IN PROGRESS  2.  Pt will perform Condition 4 on MCTSIB with mild sway or less, for improved balance. Baseline: mod sway Goal status: IN PROGRESS  3.  Pt will report 50% improvement in dizziness with functional daily activities. Baseline:  Goal status: IN PROGRESS  4.  Pt to score at least 24/30 on FGA in order to decrease risk of falls.  Baseline: 21 Goal status: IN PROGRESS  ASSESSMENT:  CLINICAL IMPRESSION: Patient arrived to session with report of mild LBP. Reviewed HEP and provided cueing for proper symptom intensity. Patient reported mild dizziness/wooziness with most of todays activities. Progressed balance challenges which occasionally required min A d/t imbalance. Patient with questions on strength training, thus initiated bodyweight resistance  exercises to perform at home for strength and general activity tolerance. No complaints at end of session.    OBJECTIVE IMPAIRMENTS: Abnormal gait, decreased balance, difficulty walking, and dizziness.   ACTIVITY LIMITATIONS: bending, squatting, sleeping, bed mobility, reach over head, and locomotion level  PARTICIPATION LIMITATIONS: community activity and fitness  PERSONAL FACTORS: 3+ comorbidities: see above  are also affecting patient's functional outcome.   REHAB POTENTIAL: Good  CLINICAL DECISION MAKING: Evolving/moderate complexity  EVALUATION COMPLEXITY: Moderate   PLAN:  PT FREQUENCY: 1-2x/week  PT DURATION: 6 weeks plus eval  PLANNED INTERVENTIONS: Therapeutic exercises, Therapeutic activity, Neuromuscular re-education, Balance training, Gait training, Patient/Family education, Self Care, Vestibular training, and Canalith repositioning  PLAN FOR NEXT SESSION: Review HEP for balance and strength, habituation and VOR   Janene Harvey, PT, DPT 07/08/22 11:47 AM  [facility] at [facility] [address] [place], [place] [address] Phone # [phone] Fax # [phone]

## 2022-07-08 ENCOUNTER — Ambulatory Visit: Payer: Medicare Other | Admitting: Physical Therapy

## 2022-07-08 ENCOUNTER — Encounter: Payer: Self-pay | Admitting: Physical Therapy

## 2022-07-08 DIAGNOSIS — R2681 Unsteadiness on feet: Secondary | ICD-10-CM | POA: Diagnosis not present

## 2022-07-08 DIAGNOSIS — R42 Dizziness and giddiness: Secondary | ICD-10-CM | POA: Diagnosis not present

## 2022-07-12 ENCOUNTER — Ambulatory Visit (INDEPENDENT_AMBULATORY_CARE_PROVIDER_SITE_OTHER): Payer: Medicare Other

## 2022-07-12 DIAGNOSIS — G459 Transient cerebral ischemic attack, unspecified: Secondary | ICD-10-CM | POA: Diagnosis not present

## 2022-07-12 LAB — CUP PACEART REMOTE DEVICE CHECK
Date Time Interrogation Session: 20240407163614
Implantable Pulse Generator Implant Date: 20240305

## 2022-07-13 ENCOUNTER — Ambulatory Visit: Payer: Medicare Other

## 2022-07-13 DIAGNOSIS — R42 Dizziness and giddiness: Secondary | ICD-10-CM

## 2022-07-13 DIAGNOSIS — R2681 Unsteadiness on feet: Secondary | ICD-10-CM | POA: Diagnosis not present

## 2022-07-13 NOTE — Therapy (Signed)
OUTPATIENT PHYSICAL THERAPY VESTIBULAR TREATMENT     Patient Name: [name] Page MRN: [id number] DOB:1950-12-03, 72 y.o., male Today's Date: 07/13/2022  END OF SESSION:  PT End of Session - 07/13/22 1101     Visit Number 5    Number of Visits 13    Date for PT Re-Evaluation 08/06/22    Authorization Type [employer]    Progress Note Due on Visit 10    PT Start Time 1101    PT Stop Time 1145    PT Time Calculation (min) 44 min    Equipment Utilized During Treatment Gait belt    Activity Tolerance Patient tolerated treatment well    Behavior During Therapy WFL for tasks assessed/performed                Past Medical History:  Diagnosis Date   Allergic rhinitis, cause unspecified    MILD   Anxiety    Arthritis    OA   Cancer    lung tumor - NO CHEMO NO RADIATION   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   Cataract    forming   Depressive disorder, not elsewhere classified    Diverticulitis    X 1 EPISODE   Esophageal reflux    past hx    HTN (hypertension)    Hyperlipidemia    CONTROLLED   Lymphocytic colitis    Neuromuscular disorder    neuropathy   Neuropathy    Other testicular hypofunction    Palpitations    Personal history of urinary calculi    LAST IN THE 80'S   Past Surgical History:  Procedure Laterality Date   COLONOSCOPY     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   [name]   parotid tumor surgery     [place]- BENIGN   POLYPECTOMY     TONSILLECTOMY AND ADENOIDECTOMY     Patient Active Problem List   Diagnosis Date Noted   Recurrent major depressive disorder, in partial remission 06/16/2022   Vitamin D deficiency 02/11/2022   Aortic atherosclerosis 11/03/2020   Chronic bilateral low back pain 06/05/2019   Venous stasis of both lower extremities 06/05/2019   History of adenomatous polyp of colon 03/24/2018   Mastoiditis 03/09/2017   Chronic pain syndrome 08/31/2016    Palpitations 08/11/2016   Eustachian tube dysfunction 12/17/2014   Generalized anxiety disorder 03/12/2014   Essential hypertension 03/12/2014   SI (sacroiliac) joint dysfunction 09/19/2013   Nonallopathic lesion of sacral region 09/19/2013   Nonallopathic lesion of thoracic region 09/19/2013   Nonallopathic lesion of lumbosacral region 09/19/2013   Peripheral neuropathy, idiopathic 12/22/2012   History of lung cancer 12/11/2012   Dyspnea 07/14/2012   Nonspecific abnormal electrocardiogram (ECG) (EKG) 07/14/2012   Hypertriglyceridemia 07/14/2012   Allergic reaction, history of 10/25/2011   Rectal or anal pain 10/01/2010   Depression 02/16/2010   Low testosterone 11/21/2007   Allergic rhinitis 06/13/2007   GERD 06/13/2007   IBS (irritable bowel syndrome) 06/13/2007    PCP: Shelva Majestic, MD  REFERRING PROVIDER: Shelva Majestic, MD   REFERRING DIAG:  R42 (ICD-10-CM) - Vertigo  R26.89 (ICD-10-CM) - Balance disorder    THERAPY DIAG:  Dizziness and giddiness  Unsteadiness on feet  ONSET DATE: 06/16/2022 (MD referral); started 12/2021  Rationale for Evaluation and Treatment: Rehabilitation  SUBJECTIVE:   SUBJECTIVE STATEMENT: Did some work around the yard yesterday and back is stiff.  No severe dizziness episodes and  fast turns/head turns cause imbalance. 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 Yes: NPRS scale: 2-3/10 Pain location: LB Pain description: stiff Aggravating factors: bending forward Relieving factors: rest  PRECAUTIONS: Fall and Other: hx of vertigo, hx of branch retinal artery occlusion, hx of mastoid effusion .  Has loop recorder implanted 2 weeks prior to PT eval  WEIGHT BEARING RESTRICTIONS: No  FALLS: Has patient fallen in last 6 months? Yes.  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 [employer]  PATIENT GOALS: Improve balance and get rid of dizziness  OBJECTIVE:   TODAY'S TREATMENT: 07/13/22 Activity Comments  Dynamic balance warm-up -tandem walk -retrowalk -march with head turns -walking eyes closed  Walking VOR x 1  -difficulty with horizontal -more coordinated and smooth with vertical  -alternating btwn near/far target  Table top push-ups 2x10 1x10 with head turns  Single leg deadlift 1x10 W/ counter support and med ball, no symptoms reported  Tall kneeling PNF chops 1x10 In VOR cancellation format, looking down/left causing symptoms  Vertical VOR cancellation Forwrad flexion to overhead extension with med ball 1x10--retroLOB  Standing on foam -EO/EC x 30 sec -head turns EO/EC      TODAY'S TREATMENT: 07/08/22 Activity Comments  standing VOR horizontal 2x30" Cueing to reduce ROM, increase pace. C/o mild dizziness   brandt daroff  3/10 dizziness  fwd/back stepping + head nods  2x5 each LE 1 UE support; noted mild dizziness looking down; required occasional min A d/t imbalance   1/2 turns to targets + toe taps  Occasional min A d/t imbalance  romberg on foam + head turns/nods 2x30" each Moderate sway; worse with head nods   Wall squat 10x Cueing for proper foot positioning  Alt step ups  With 1 handrail support  Counter top push ups  Cueing to maintain neutral spine; edu on foot positioning to change amount of challenge      PATIENT EDUCATION: Education details: encouraged taking a break from diagonals d/t LBP; edu on how to adjust symptom intensity with each exercise; answered patient's questions on safe strength training; HEP update Person educated: Patient Education method: Explanation, Demonstration, Tactile cues, Verbal cues, and Handouts Education comprehension:  verbalized understanding and returned demonstration   HOME EXERCISE PROGRAM Last updated: 07/08/22 Access Code: [email or web address] URL: [email or web address] Date: 07/08/2022 Prepared by: [facility] - Outpatient  Rehab - [facility]  Exercises - Romberg Stance with Eyes Closed  - 1 x daily - 5 x weekly - 2-3 sets - 30 sec hold - Tandem Walking with Counter Support  - 1 x daily - 5 x weekly - 2 sets - 5 reps - Standing Gaze Stabilization with Head Rotation  - 1 x daily - 5 x weekly - 2-3 sets - 30 sec hold - Standing Gaze Stabilization with Head Rotation  - 1 x daily - 5 x weekly - 2-3 sets - 30 sec hold - Standing Diagonal Chops with Medicine Ball  - 1 x daily - 5 x weekly - 2 sets - 10 reps - Brandt-Daroff Vestibular Exercise  - 1 x daily - 5 x weekly - 2 sets - 3-5 reps - Wall Squat  - 1 x daily -  5 x weekly - 2-3 sets - 10 reps - Push Up on Table  - 1 x daily - 5 x weekly - 2 sets - 10 reps - Step Up  - 1 x daily - 5 x weekly - 3-4 sets - 10 reps - Corner Balance Feet Together: Eyes Open With Head Turns  - 1 x daily - 7 x weekly - 3 sets - 3-5 reps - Corner Balance Feet Together: Eyes Closed With Head Turns  - 1 x daily - 7 x weekly - 2-3 sets - 3-5 reps     Below measures were taken at time of initial evaluation unless otherwise specified:   DIAGNOSTIC FINDINGS: MRI brain : 04/13/2022 Other: None.   IMPRESSION: 1. Normal brain MRI for age. No acute intracranial abnormality identified. 2. Moderate to large left mastoid effusion. Finding is of uncertain significance. Correlation with physical exam suggested.   CTA H&N 1/9/024:  FINDINGS: CT HEAD FINDINGS   Brain: No evidence of acute large vascular territory infarction, hemorrhage, hydrocephalus, extra-axial collection or mass lesion/mass effect.   Vascular: Detailed below.   Skull: No acute fracture.   Sinuses/Orbits: No acute finding.   Other: Small left mastoid effusion.   Review of the MIP images  confirms the above findings   CTA NECK FINDINGS   Aortic arch: Great vessel origins are patent without significant stenosis.   Right carotid system: No evidence of dissection, stenosis (50% or greater), or occlusion.   Left carotid system: No evidence of dissection, stenosis (50% or greater), or occlusion.   Vertebral arteries: Left dominant. No evidence of dissection, stenosis (50% or greater), or occlusion.   Skeleton: No acute abnormality.   Other neck: No acute abnormality.   Upper chest: Visualized lung apices are clear.   Review of the MIP images confirms the above findings   CTA HEAD FINDINGS   Anterior circulation: Bilateral intracranial ICAs MCAs, and ACAs are patent without proximal hemodynamically significant stenosis.   Posterior circulation: Bilateral low intradural vertebral arteries, basilar artery, and bilateral posterior cerebral arteries are patent without proximal hemodynamically significant stenosis.   Venous sinuses: Not well evaluated due to arterial timing.   Review of the MIP images confirms the above findings   IMPRESSION: 1. No evidence of acute intracranial abnormality. 2. No large vessel occlusion or proximal hemodynamically significant stenosis  COGNITION: Overall cognitive status: Within functional limits for tasks assessed   POSTURE:  No Significant postural limitations  Cervical ROM:  WFL active ROM in sitting   TRANSFERS: Assistive device utilized: None  Sit to stand: Modified independence Stand to sit: Modified independence  PATIENT SURVEYS:  FOTO intake score 56; predicted 55  VESTIBULAR ASSESSMENT:  GENERAL OBSERVATION: No acute distress   SYMPTOM BEHAVIOR:  Subjective history: Dizziness occurred in September 2023-was so bad, I went to ED.  Occurs while looking down, looking up, turning head in bed.  Momentary, but feels like I might fall over.  In January, had visual deficit while working on the computer.     Non-Vestibular symptoms: changes in vision  Type of dizziness: Imbalance (Disequilibrium) and "Funny feeling in the head"  Frequency: several times per day  Duration: seconds  Aggravating factors: Induced by motion: looking up at the ceiling, bending down to the ground, and turning head quickly  Relieving factors: head stationary and previous course of prednisone  Progression of symptoms: better  OCULOMOTOR EXAM:  Ocular Alignment: abnormal and L hypermetria (R is lower)  Ocular ROM: No Limitations  Spontaneous Nystagmus: absent  Gaze-Induced Nystagmus: right beating with right gaze  Smooth Pursuits:  saccadic   Saccades: intact  Convergence/Divergence: 5  cm   VESTIBULAR - OCULAR REFLEX:   Slow VOR: Normal  VOR Cancellation: Normal  Head-Impulse Test: HIT Right: positive HIT Left: negative    POSITIONAL TESTING: Right Dix-Hallpike: no nystagmus and no symtpoms Left Dix-Hallpike: no nystagmus and reports mild dizzinesss/spinning Right Roll Test: no nystagmus Left Roll Test: no nystagmus       FUNCTIONAL TESTS:  M-CTSIB  Condition 1: Firm Surface, EO 30 Sec, Normal Sway  Condition 2: Firm Surface, EC 30 Sec, Mild Sway  Condition 3: Foam Surface, EO 30 Sec, Mild Sway  Condition 4: Foam Surface, EC 30 Sec, moderate Sway     VESTIBULAR TREATMENT:                                                                                                   DATE: 06/24/2022 (treatment not yet initiated)   PATIENT EDUCATION: Education details: Eval results, POC Person educated: Patient Education method: Explanation Education comprehension: verbalized understanding  HOME EXERCISE PROGRAM:  GOALS: Goals reviewed with patient? Yes  SHORT TERM GOALS: Target date: 07/23/2022  Pt will be independent with HEP for improved dizziness and balance. Baseline: Goal status: IN PROGRESS  2.  FGA to be assessed, and goal to be written as appropriate. Baseline:  Goal status: IN  PROGRESS  LONG TERM GOALS: Target date: 08/06/2022  Pt will be independent with HEP for improved dizziness and balance. Baseline:  Goal status: IN PROGRESS  2.  Pt will perform Condition 4 on MCTSIB with mild sway or less, for improved balance. Baseline: mod sway Goal status: IN PROGRESS  3.  Pt will report 50% improvement in dizziness with functional daily activities. Baseline:  Goal status: IN PROGRESS  4.  Pt to score at least 24/30 on FGA in order to decrease risk of falls.  Baseline: 21 Goal status: IN PROGRESS  ASSESSMENT:  CLINICAL IMPRESSION: Continued with activities and strategies to improve dynamic and static balance for improved proprioceptive and kinesthetic awareness and focus on habituation of movements to reduce disturbance.  Notes that looking/turning head down/left triggers imbalance/dizziness symptom and losing balance with extreme of head/neck extension/looking up but not via the same symptom.  Balance activities on foam reveal significant sway and severe/LOB with eyes closed conditions and head turns.  Addition of corner balance activities for eyes closed/head turns conditions. Continued sessions to progress STG/LTG   OBJECTIVE IMPAIRMENTS: Abnormal gait, decreased balance, difficulty walking, and dizziness.   ACTIVITY LIMITATIONS: bending, squatting, sleeping, bed mobility, reach over head, and locomotion level  PARTICIPATION LIMITATIONS: community activity and fitness  PERSONAL FACTORS: 3+ comorbidities: see above  are also affecting patient's functional outcome.   REHAB POTENTIAL: Good  CLINICAL DECISION MAKING: Evolving/moderate complexity  EVALUATION COMPLEXITY: Moderate   PLAN:  PT FREQUENCY: 1-2x/week  PT DURATION: 6 weeks plus eval  PLANNED INTERVENTIONS: Therapeutic exercises, Therapeutic activity, Neuromuscular re-education, Balance training, Gait training, Patient/Family education, Self Care, Vestibular training, and Canalith  repositioning  PLAN FOR  NEXT SESSION: Review HEP for balance and strength, habituation and VOR   12:00 PM, 07/13/22 M. Shary DecampKelly Alfreida Steffenhagen, PT, DPT Physical Therapist- [place] Office Number: [phone]

## 2022-07-14 NOTE — Therapy (Addendum)
 OUTPATIENT PHYSICAL THERAPY VESTIBULAR TREATMENT     Patient Name: [name] Page MRN: [id number] DOB:[birth year], 72 y.o., male Today's Date: 07/15/2022  END OF SESSION:  PT End of Session - 07/15/22 1229     Visit Number 6    Number of Visits 13    Date for PT Re-Evaluation 08/06/22    Authorization Type [employer] Medicare    Progress Note Due on Visit 10    PT Start Time 1145    PT Stop Time 1228    PT Time Calculation (min) 43 min    Equipment Utilized During Treatment Gait belt    Activity Tolerance Patient tolerated treatment well    Behavior During Therapy WFL for tasks assessed/performed                 Past Medical History:  Diagnosis Date   Allergic rhinitis, cause unspecified    MILD   Anxiety    Arthritis    OA   Cancer    lung tumor - NO CHEMO NO RADIATION   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   Cataract    forming   Depressive disorder, not elsewhere classified    Diverticulitis    X 1 EPISODE   Esophageal reflux    past hx    HTN (hypertension)    Hyperlipidemia    CONTROLLED   Lymphocytic colitis    Neuromuscular disorder    neuropathy   Neuropathy    Other testicular hypofunction    Palpitations    Personal history of urinary calculi    LAST IN THE 80'S   Past Surgical History:  Procedure Laterality Date   COLONOSCOPY     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   [name]   parotid tumor surgery     [place]- BENIGN   POLYPECTOMY     TONSILLECTOMY AND ADENOIDECTOMY     Patient Active Problem List   Diagnosis Date Noted   Recurrent major depressive disorder, in partial remission 06/16/2022   Vitamin D deficiency 02/11/2022   Aortic atherosclerosis 11/03/2020   Chronic bilateral low back pain 06/05/2019   Venous stasis of both lower extremities 06/05/2019   History of adenomatous polyp of colon 03/24/2018   Mastoiditis 03/09/2017   Chronic pain syndrome 08/31/2016    Palpitations 08/11/2016   Eustachian tube dysfunction 12/17/2014   Generalized anxiety disorder 03/12/2014   Essential hypertension 03/12/2014   SI (sacroiliac) joint dysfunction 09/19/2013   Nonallopathic lesion of sacral region 09/19/2013   Nonallopathic lesion of thoracic region 09/19/2013   Nonallopathic lesion of lumbosacral region 09/19/2013   Peripheral neuropathy, idiopathic 12/22/2012   History of lung cancer 12/11/2012   Dyspnea 07/14/2012   Nonspecific abnormal electrocardiogram (ECG) (EKG) 07/14/2012   Hypertriglyceridemia 07/14/2012   Allergic reaction, history of 10/25/2011   Rectal or anal pain 10/01/2010   Depression 02/16/2010   Low testosterone 11/21/2007   Allergic rhinitis 06/13/2007   GERD 06/13/2007   IBS (irritable bowel syndrome) 06/13/2007    PCP: Shelva Majestic, MD  REFERRING PROVIDER: Shelva Majestic, MD   REFERRING DIAG:  R42 (ICD-10-CM) - Vertigo  R26.89 (ICD-10-CM) - Balance disorder    THERAPY DIAG:  Dizziness and giddiness  Unsteadiness on feet  ONSET DATE: 06/16/2022 (MD referral); started 12/2021  Rationale for Evaluation and Treatment: Rehabilitation  SUBJECTIVE:   SUBJECTIVE STATEMENT: Dizziness hasn't been bad. Feels like it has been improving. Looking up or down to the  L side, turning head to L while in bed still brings it on. Surprised by how bad his balance is and how weak his muscles are. Noticing more R sided LBP after playing guitar d/t positioning.   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 Yes: NPRS scale: 2-3/10 Pain location: LB Pain description: stiff Aggravating factors: bending forward Relieving factors: rest  PRECAUTIONS: Fall and Other: hx of vertigo, hx of branch retinal artery occlusion, hx of mastoid effusion .  Has loop recorder implanted 2 weeks prior to PT  eval  WEIGHT BEARING RESTRICTIONS: No  FALLS: Has patient fallen in last 6 months? Yes. 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 [employer]  PATIENT GOALS: Improve balance and get rid of dizziness  OBJECTIVE:      TODAY'S TREATMENT: 07/15/22 Activity Comments  Rolling R/L EO and EC 5x C/o dizziness to L  Standing reaching to multidirectional targets on wall 2x30" C/o some imbalance  Standing on foam, reaching for cones overhead    Romberg EC 30" Mild-mod sway  Romberg EC + head turns 30" Mild-sway sway   placing cones on floor, forward step over, then reversed it Some imbalance with backwards direction  Walking with EC, backwards, grapevine  Cueing for wider BOS, sequencing with grapevine     HOME EXERCISE PROGRAM Last updated: 07/15/22 Access Code: [email or web address] URL: [email or web address] Date: 07/15/2022 Prepared by: [facility] [place] - Outpatient  Rehab - [facility]  Exercises - Tandem Walking with Counter Support  - 1 x daily - 5 x weekly - 2 sets - 5 reps - Standing Gaze Stabilization with Head Rotation  - 1 x daily - 5 x weekly - 2-3 sets - 30 sec hold - Standing Gaze Stabilization with Head Rotation  - 1 x daily - 5 x weekly - 2-3 sets - 30 sec hold - Standing Diagonal Chops with Medicine Ball  - 1 x daily - 5 x weekly - 2 sets - 10 reps - Wall Squat  - 1 x daily - 5 x weekly - 2-3 sets - 10 reps - Push Up on Table  - 1 x daily - 5 x weekly - 2 sets - 10 reps - Step Up  - 1 x daily - 5 x weekly - 3-4 sets - 10 reps - Corner Balance Feet Together: Eyes Closed With Head Turns  - 1 x daily - 7 x weekly - 2-3 sets - 3-5 reps - Supine to Left Sidelying Vestibular Habituation  - 1 x daily - 5 x weekly - 2 sets - 5 reps   PATIENT EDUCATION: Education details: HEP update and consolidation; answered pt's questions on 30 day  hold policy  Person educated: Patient Education method: Explanation, Demonstration, Tactile cues, Verbal cues, and Handouts Education comprehension: verbalized understanding and returned demonstration     Below measures were taken at time of initial evaluation unless otherwise specified:   DIAGNOSTIC FINDINGS: MRI brain : 04/13/2022 Other: None.   IMPRESSION: 1. Normal brain MRI for age. No acute intracranial abnormality identified. 2. Moderate to large left mastoid effusion. Finding is of uncertain significance. Correlation with physical exam suggested.   CTA H&N 1/9/024:  FINDINGS: CT HEAD FINDINGS   Brain: No evidence of acute large  vascular territory infarction, hemorrhage, hydrocephalus, extra-axial collection or mass lesion/mass effect.   Vascular: Detailed below.   Skull: No acute fracture.   Sinuses/Orbits: No acute finding.   Other: Small left mastoid effusion.   Review of the MIP images confirms the above findings   CTA NECK FINDINGS   Aortic arch: Great vessel origins are patent without significant stenosis.   Right carotid system: No evidence of dissection, stenosis (50% or greater), or occlusion.   Left carotid system: No evidence of dissection, stenosis (50% or greater), or occlusion.   Vertebral arteries: Left dominant. No evidence of dissection, stenosis (50% or greater), or occlusion.   Skeleton: No acute abnormality.   Other neck: No acute abnormality.   Upper chest: Visualized lung apices are clear.   Review of the MIP images confirms the above findings   CTA HEAD FINDINGS   Anterior circulation: Bilateral intracranial ICAs MCAs, and ACAs are patent without proximal hemodynamically significant stenosis.   Posterior circulation: Bilateral low intradural vertebral arteries, basilar artery, and bilateral posterior cerebral arteries are patent without proximal hemodynamically significant stenosis.   Venous sinuses: Not well evaluated  due to arterial timing.   Review of the MIP images confirms the above findings   IMPRESSION: 1. No evidence of acute intracranial abnormality. 2. No large vessel occlusion or proximal hemodynamically significant stenosis  COGNITION: Overall cognitive status: Within functional limits for tasks assessed   POSTURE:  No Significant postural limitations  Cervical ROM:  WFL active ROM in sitting   TRANSFERS: Assistive device utilized: None  Sit to stand: Modified independence Stand to sit: Modified independence  PATIENT SURVEYS:  FOTO intake score 56; predicted 55  VESTIBULAR ASSESSMENT:  GENERAL OBSERVATION: No acute distress   SYMPTOM BEHAVIOR:  Subjective history: Dizziness occurred in September 2023-was so bad, I went to ED.  Occurs while looking down, looking up, turning head in bed.  Momentary, but feels like I might fall over.  In January, had visual deficit while working on the computer.    Non-Vestibular symptoms: changes in vision  Type of dizziness: Imbalance (Disequilibrium) and "Funny feeling in the head"  Frequency: several times per day  Duration: seconds  Aggravating factors: Induced by motion: looking up at the ceiling, bending down to the ground, and turning head quickly  Relieving factors: head stationary and previous course of prednisone  Progression of symptoms: better  OCULOMOTOR EXAM:  Ocular Alignment: abnormal and L hypermetria (R is lower)  Ocular ROM: No Limitations  Spontaneous Nystagmus: absent  Gaze-Induced Nystagmus: right beating with right gaze  Smooth Pursuits:  saccadic   Saccades: intact  Convergence/Divergence: 5  cm   VESTIBULAR - OCULAR REFLEX:   Slow VOR: Normal  VOR Cancellation: Normal  Head-Impulse Test: HIT Right: positive HIT Left: negative    POSITIONAL TESTING: Right Dix-Hallpike: no nystagmus and no symtpoms Left Dix-Hallpike: no nystagmus and reports mild dizzinesss/spinning Right Roll Test: no nystagmus Left Roll  Test: no nystagmus       FUNCTIONAL TESTS:  M-CTSIB  Condition 1: Firm Surface, EO 30 Sec, Normal Sway  Condition 2: Firm Surface, EC 30 Sec, Mild Sway  Condition 3: Foam Surface, EO 30 Sec, Mild Sway  Condition 4: Foam Surface, EC 30 Sec, moderate Sway     VESTIBULAR TREATMENT:  DATE: 06/24/2022 (treatment not yet initiated)   PATIENT EDUCATION: Education details: Eval results, POC Person educated: Patient Education method: Explanation Education comprehension: verbalized understanding  HOME EXERCISE PROGRAM:  GOALS: Goals reviewed with patient? Yes  SHORT TERM GOALS: Target date: 07/23/2022  Pt will be independent with HEP for improved dizziness and balance. Baseline: Goal status: IN PROGRESS  2.  FGA to be assessed, and goal to be written as appropriate. Baseline:  Goal status: IN PROGRESS  LONG TERM GOALS: Target date: 08/06/2022  Pt will be independent with HEP for improved dizziness and balance. Baseline:  Goal status: IN PROGRESS  2.  Pt will perform Condition 4 on MCTSIB with mild sway or less, for improved balance. Baseline: mod sway Goal status: IN PROGRESS  3.  Pt will report 50% improvement in dizziness with functional daily activities. Baseline:  Goal status: IN PROGRESS  4.  Pt to score at least 24/30 on FGA in order to decrease risk of falls.  Baseline: 21 Goal status: IN PROGRESS  ASSESSMENT:  CLINICAL IMPRESSION: Patient arrived to session with report of improvement in dizziness. Still notes L diagonal movements and turning head to L bring on sx. Worked on simulating these movements and habituate them. Patient did c/o dizziness or imbalance with most activities performed today. Updated and consolidated HEP for max benefit- patient reported understanding. Upon leaving, patient reports that he wants to wait to schedule more appointments and try HEP on  his own.    OBJECTIVE IMPAIRMENTS: Abnormal gait, decreased balance, difficulty walking, and dizziness.   ACTIVITY LIMITATIONS: bending, squatting, sleeping, bed mobility, reach over head, and locomotion level  PARTICIPATION LIMITATIONS: community activity and fitness  PERSONAL FACTORS: 3+ comorbidities: see above  are also affecting patient's functional outcome.   REHAB POTENTIAL: Good  CLINICAL DECISION MAKING: Evolving/moderate complexity  EVALUATION COMPLEXITY: Moderate   PLAN:  PT FREQUENCY: 1-2x/week  PT DURATION: 6 weeks plus eval  PLANNED INTERVENTIONS: Therapeutic exercises, Therapeutic activity, Neuromuscular re-education, Balance training, Gait training, Patient/Family education, Self Care, Vestibular training, and Canalith repositioning  PLAN FOR NEXT SESSION: Review HEP for balance and strength, habituation and VOR  Anette Guarneri, PT, DPT 07/15/22 12:32 PM  [facility] at [facility] [address], [address] [place], [place] [address] Phone # [phone] Fax # [phone]     PHYSICAL THERAPY DISCHARGE SUMMARY  Visits from Start of Care: 6  Current functional level related to goals / functional outcomes: Unable to assess; pt did not return   Remaining deficits: Unable to assess   Education / Equipment: HEP  Plan: Patient agrees to discharge.  Patient goals were not met. Patient is being discharged due to not returning.      Baldemar Friday, PT, DPT 06/20/23 3:49 PM  [facility] at [facility] [address] [place], [address] [place], [place] [address] Phone # [phone] Fax # [phone]

## 2022-07-15 ENCOUNTER — Ambulatory Visit: Payer: Medicare Other | Admitting: Physical Therapy

## 2022-07-15 ENCOUNTER — Encounter: Payer: Self-pay | Admitting: Physical Therapy

## 2022-07-15 DIAGNOSIS — R42 Dizziness and giddiness: Secondary | ICD-10-CM | POA: Diagnosis not present

## 2022-07-15 DIAGNOSIS — R2681 Unsteadiness on feet: Secondary | ICD-10-CM

## 2022-08-04 ENCOUNTER — Other Ambulatory Visit: Payer: Self-pay

## 2022-08-04 ENCOUNTER — Other Ambulatory Visit (HOSPITAL_BASED_OUTPATIENT_CLINIC_OR_DEPARTMENT_OTHER): Payer: Self-pay

## 2022-08-04 ENCOUNTER — Encounter: Payer: Self-pay | Admitting: Family Medicine

## 2022-08-04 MED ORDER — HYDROCODONE-ACETAMINOPHEN 5-325 MG PO TABS
0.5000 | ORAL_TABLET | Freq: Two times a day (BID) | ORAL | 0 refills | Status: DC | PRN
  Filled 2022-08-04: qty 55, 28d supply, fill #0

## 2022-08-04 NOTE — Progress Notes (Signed)
sent 

## 2022-08-05 ENCOUNTER — Other Ambulatory Visit (HOSPITAL_BASED_OUTPATIENT_CLINIC_OR_DEPARTMENT_OTHER): Payer: Self-pay

## 2022-08-05 ENCOUNTER — Other Ambulatory Visit: Payer: Self-pay

## 2022-08-07 ENCOUNTER — Other Ambulatory Visit (HOSPITAL_BASED_OUTPATIENT_CLINIC_OR_DEPARTMENT_OTHER): Payer: Self-pay

## 2022-08-08 ENCOUNTER — Other Ambulatory Visit (HOSPITAL_BASED_OUTPATIENT_CLINIC_OR_DEPARTMENT_OTHER): Payer: Self-pay

## 2022-08-09 ENCOUNTER — Other Ambulatory Visit (HOSPITAL_BASED_OUTPATIENT_CLINIC_OR_DEPARTMENT_OTHER): Payer: Self-pay

## 2022-08-09 ENCOUNTER — Other Ambulatory Visit: Payer: Self-pay

## 2022-08-10 DIAGNOSIS — H5500 Unspecified nystagmus: Secondary | ICD-10-CM | POA: Diagnosis not present

## 2022-08-10 DIAGNOSIS — R42 Dizziness and giddiness: Secondary | ICD-10-CM | POA: Diagnosis not present

## 2022-08-16 ENCOUNTER — Ambulatory Visit (INDEPENDENT_AMBULATORY_CARE_PROVIDER_SITE_OTHER): Payer: Medicare Other

## 2022-08-16 DIAGNOSIS — G459 Transient cerebral ischemic attack, unspecified: Secondary | ICD-10-CM

## 2022-08-17 LAB — CUP PACEART REMOTE DEVICE CHECK
Date Time Interrogation Session: 20240510163613
Implantable Pulse Generator Implant Date: 20240305

## 2022-08-18 NOTE — Progress Notes (Signed)
[facility] Summary Report / Loop Recorder 

## 2022-09-08 ENCOUNTER — Other Ambulatory Visit (HOSPITAL_COMMUNITY): Payer: Self-pay

## 2022-09-08 ENCOUNTER — Other Ambulatory Visit (HOSPITAL_BASED_OUTPATIENT_CLINIC_OR_DEPARTMENT_OTHER): Payer: Self-pay

## 2022-09-08 ENCOUNTER — Telehealth: Payer: Medicare Other | Admitting: Endocrinology

## 2022-09-08 DIAGNOSIS — E291 Testicular hypofunction: Secondary | ICD-10-CM | POA: Diagnosis not present

## 2022-09-08 DIAGNOSIS — R7301 Impaired fasting glucose: Secondary | ICD-10-CM | POA: Diagnosis not present

## 2022-09-08 MED ORDER — CLOMIPHENE CITRATE 50 MG PO TABS
25.0000 mg | ORAL_TABLET | ORAL | 1 refills | Status: DC
  Filled 2022-09-08: qty 12, 56d supply, fill #0

## 2022-09-08 NOTE — Progress Notes (Signed)
Patient ID: [name] Page, male   DOB: 1950-12-19, 72 y.o.   MRN: [id number]             Chief complaint: Endocrinology follow-up  I connected with the above-named patient by video enabled telemedicine application and verified that I am speaking with the correct person. The patient was explained the limitations of evaluation and management by telemedicine and the availability of in person appointments.  Patient also understood that there may be a patient responsible charge related to this service  Location of the patient: Patient's home  Location of the provider: Physician office Only the patient and myself were participating in the encounter The patient understood the above statements and agreed to proceed.   History of Present Illness   HYPOGONADISM:  Hypogonadism was diagnosed in ?  2012  He at that time had complaints of fatigue, some increased depression, decreased libido although he does not remember symptoms well There is no history of the following: Hot flushes, sweats, breast enlargement, long term anabolic steroid use, history of testicular injury, head injury or mumps in childhood.  No history of osteopenia or low impact fracture  At that time an afternoon testosterone level was 214 and free testosterone level low at 32, normal >47         He was also evaluated by a urologist and details are not available He was treated with testosterone gel but he thinks this did not improve his testosterone levels and likely did not continue treatment  Subsequently he was again evaluated in 2015 and with a low testosterone of 239 he was started on testosterone injections With this he thinks she had some improvement in his fatigue and depression His dose had been adjusted from his initial starting dose of 100 mg every 2 weeks and he has been on various regimens including every 3 or 4 weeks injections His testosterone level had been quite variable since about 2016  RECENT  history:  He was taking 30 mg of testosterone weekly  Because of his recent stroke his testosterone supplementation was stopped in January by his neurologist and apparently no other cause of stroke was documented  Since then he has at fatigue and not functioning as well  Testosterone level is most recently 148 compared to 575  Testosterone level was checked about 2 days after his injection  Hemoglobin is still normal while on injections  His lab results show testosterone levels as follows:   Lab Results  Component Value Date   TESTOSTERONE 148.47 (L) 06/16/2022   TESTOSTERONE 575.02 02/11/2022   TESTOSTERONE 399.85 08/17/2021   TESTOSTERONE 760.38 03/27/2021   Lab Results  Component Value Date   HGB 16.8 04/13/2022    Prolactin level: Normal   Lab Results  Component Value Date   LH <0.20 (L) 04/07/2017   Lab Results  Component Value Date   HGB 16.8 04/13/2022   HGB 16.3 02/11/2022   HGB 17.0 12/15/2021   HGB 16.4 08/17/2021              Allergies as of 09/08/2022       Reactions   Bee Venom Anaphylaxis   Colestipol Diarrhea, Other (See Comments)   Epinephrine Other (See Comments)   Flagyl [metronidazole Hcl] Other (See Comments)   Neuropathy    Ibuprofen Other (See Comments)   Nsaids    REACTION: intolereance- ABD PAIN AND DIARRHEA    Protonix [pantoprazole]    Abdominal Pain and Diarrhea  Medication List        Accurate as of September 08, 2022 10:31 AM. If you have any questions, ask your nurse or doctor.          ACETYL L-CARNITINE PO Take by mouth.   aspirin EC 325 MG tablet Take 325 mg by mouth daily.   buPROPion 300 MG 24 hr tablet Commonly known as: WELLBUTRIN XL TAKE 1 TABLET BY MOUTH DAILY   Citrucel oral powder Generic drug: methylcellulose Use as directed once daily   clonazePAM 0.5 MG tablet Commonly known as: KLONOPIN Take 1 tablet (0.5 mg total) by mouth 2 (two) times daily as needed for anxiety (do not drive for 12  hours after taking).   diltiazem 180 MG 24 hr capsule Commonly known as: CARDIZEM CD TAKE 1 CAPSULE BY MOUTH DAILY   gabapentin 300 MG capsule Commonly known as: NEURONTIN Take 3 capsules (900 mg total) by mouth at bedtime.   hydrochlorothiazide 25 MG tablet Commonly known as: HYDRODIURIL Take 1 tablet (25 mg total) by mouth daily.   HYDROcodone-acetaminophen 5-325 MG tablet Commonly known as: NORCO/VICODIN Take 0.5-1 tablets by mouth 2 (two) times daily as needed.; do not exceed more than one to three times per week. (Take 0.5-1 tablets by mouth 2 (two) times daily as needed. Do not use indicated instructions more than one to three times per week.)   potassium chloride 10 MEQ tablet Commonly known as: KLOR-CON TAKE 3 TABLETS BY MOUTH IN  THE MORNING AND 2 TABLETS  BY MOUTH IN THE AFTERNOON   rosuvastatin 5 MG tablet Commonly known as: Crestor Take 1 tablet (5 mg total) by mouth daily.   vitamin C 1000 MG tablet   Vitamin D 50 MCG (2000 UT) Caps Take 1 capsule by mouth daily.        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   Cataract    forming   Depressive disorder, not elsewhere classified    Diverticulitis    X 1 EPISODE   Esophageal reflux    past hx    HTN (hypertension)    Hyperlipidemia    CONTROLLED   Lymphocytic colitis    Neuromuscular disorder (HCC)    neuropathy   Neuropathy    Other  testicular hypofunction    Palpitations    Personal history of urinary calculi    LAST IN THE 80'S    Past Surgical History:  Procedure Laterality Date   COLONOSCOPY     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   [name]   parotid tumor surgery     [place]- BENIGN   POLYPECTOMY     TONSILLECTOMY AND ADENOIDECTOMY      Family History  Problem Relation Age of Onset   Hypertension Mother    Anxiety  disorder Mother    Non-Hodgkin's lymphoma Mother    Dementia Father    Depression Father        and anxiety   Hypertension Father    Sudden death Paternal Uncle 40   Colon cancer Paternal Uncle    Hypertension Brother    Early death Neg Hx    Heart disease Neg Hx        mother in 69s had lesoin   Colon polyps Neg Hx    Esophageal cancer Neg Hx    Rectal cancer Neg Hx    Stomach cancer Neg Hx     Social History:  reports that he has never smoked. He has never used smokeless tobacco. He reports current alcohol use. He reports that he does not use drugs.  Review of Systems  IMPAIRED fasting glucose:   Although his A1c is normal usually his fasting glucose may be occasionally high  Fasting glucose on his glucose tolerance test done on 03/31/18 was 105 but 2-hour reading was only 103  Most recent nonfasting glucose was 106 With a Contour meter at home has fasting readings range from 100-110 generally  He has lost some weight this year with better diet and exercise regimen  Wt Readings from Last 3 Encounters:  06/28/22 170 lb 9.6 oz (77.4 kg)  06/16/22 179 lb 3.2 oz (81.3 kg)  06/08/22 182 lb (82.6 kg)   Lab Results  Component Value Date   GLUCOSE 106 (H) 06/16/2022   GLUCOSE 131 (H) 04/13/2022   GLUCOSE 98 02/11/2022   Lab Results  Component Value Date   HGBA1C 5.6 04/19/2022   HGBA1C 5.9 02/11/2022   HGBA1C 5.6 12/17/2021   Lab Results  Component Value Date   LDLCALC 58 02/11/2022   CREATININE 0.96 06/16/2022    General  Examination:   There were no vitals taken for this visit.     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  He was on treatment with Depo-Testosterone once a week and previously doing subjectively well  He has significant fatigue with stopping the testosterone about 4 months ago because of his drug Testosterone level is significantly low as expected  IMPAIRED fasting glucose with A1c normal, likely improved with weight loss  Plan:  Trial of CLOMIPHENE to see if he has any response as this would be safer than testosterone supplements Otherwise plan to use Natesto which should have less impact on hematocrit  Follow-up with fasting labs in about 2 months  Reather Littler 09/08/2022, 10:31 AM     Note: This office note was prepared with Dragon voice recognition system technology. Any transcriptional errors that result from this process are unintentional.

## 2022-09-13 NOTE — Progress Notes (Signed)
[facility] Summary Report / Loop Recorder 

## 2022-09-14 ENCOUNTER — Encounter: Payer: Self-pay | Admitting: Neurology

## 2022-09-14 ENCOUNTER — Encounter: Payer: Self-pay | Admitting: Endocrinology

## 2022-09-14 NOTE — Telephone Encounter (Signed)
Dr Lucianne Muss, Please Advise?

## 2022-09-19 ENCOUNTER — Other Ambulatory Visit: Payer: Self-pay | Admitting: Family Medicine

## 2022-09-20 ENCOUNTER — Ambulatory Visit (INDEPENDENT_AMBULATORY_CARE_PROVIDER_SITE_OTHER): Payer: Medicare Other

## 2022-09-20 DIAGNOSIS — G459 Transient cerebral ischemic attack, unspecified: Secondary | ICD-10-CM

## 2022-09-20 LAB — CUP PACEART REMOTE DEVICE CHECK
Date Time Interrogation Session: 20240616231309
Implantable Pulse Generator Implant Date: 20240305

## 2022-09-22 NOTE — Progress Notes (Signed)
[facility] [address] [place] [address] Phone: [phone] Subjective:   [name], am serving as a scribe for Dr. Antoine Primas.  I'm seeing this patient by the request  of:  Shelva Majestic, MD  CC: Low back pain, neuropathy  ION:[id number]  [name] Page is a 72 y.o. male coming in with complaint of back pain and neuropathy. Patient states wants to talk to you about DHEA for low testosterone. Has flares of back pain in lower back. Has been attempting back exercises and walks on treadmill. Not interest in physical therapy. Combination of sharp and dull pain when having flares. Took some Flexeril, but just knocks him out. Heat seems to help. Does use a lidocaine patch every now and again. Hydrocodone for neuropathy when it gets bad from PCP. Will take some during high intense flares.       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   Cataract    forming   Depressive disorder, not elsewhere classified    Diverticulitis    X 1 EPISODE   Esophageal reflux    past hx    HTN (hypertension)    Hyperlipidemia    CONTROLLED   Lymphocytic colitis    Neuromuscular disorder (HCC)    neuropathy   Neuropathy    Other testicular hypofunction    Palpitations    Personal history of urinary calculi    LAST IN THE 80'S   Past Surgical History:  Procedure Laterality Date   COLONOSCOPY     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   [name]   parotid tumor surgery     [place]- [facility]   POLYPECTOMY     TONSILLECTOMY AND ADENOIDECTOMY     Social History   Socioeconomic History   Marital status: Married    Spouse name: Not on file   Number of children: 0   Years of education: 18   Highest education level: Not on  file  Occupational History   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 Married 30 + years. No children, lots of critters.       Cancer Survivor- carcinoid lung cancer.   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 Micah Flesher back to work for ability to be insured.       Hobbies: music, home repair/improvement, cats   Social Determinants of Health   Financial Resource Strain: Low Risk  (06/19/2021)   Overall Financial Resource Strain (CARDIA)    Difficulty of Paying Living Expenses: Not hard at  all  Food Insecurity: No Food Insecurity (06/27/2022)   Hunger Vital Sign    Worried About Running Out of Food in the Last Year: Never true    Ran Out of Food in the Last Year: Never true  Transportation Needs: No Transportation Needs (06/27/2022)   PRAPARE - Administrator, Civil Service (Medical): No    Lack of Transportation (Non-Medical): No  Physical Activity: Sufficiently Active (06/27/2022)   Exercise Vital Sign    Days of Exercise per Week: 5 days    Minutes of Exercise per Session: 50 min  Stress: No Stress Concern Present (06/27/2022)   [employer] of Occupational Health - Occupational Stress Questionnaire    Feeling of Stress : Only a little  Social Connections: Unknown (06/27/2022)   Social Connection and Isolation Panel [NHANES]    Frequency of Communication with Friends and Family: Patient declined    Frequency of Social  Gatherings with Friends and Family: Patient declined    Attends Religious Services: Never    Database administrator or Organizations: No    Attends Engineer, structural: Never    Marital Status: Married   Allergies  Allergen Reactions   Bee Venom Anaphylaxis   Colestipol Diarrhea and Other (See Comments)   Epinephrine Other (See Comments)   Flagyl [Metronidazole Hcl] Other (See Comments)    Neuropathy    Ibuprofen Other (See Comments)   Nsaids     REACTION: intolereance- ABD PAIN AND DIARRHEA    Protonix [Pantoprazole]     Abdominal Pain and Diarrhea   Family History  Problem Relation Age of Onset   Hypertension Mother    Anxiety disorder Mother    Non-Hodgkin's lymphoma Mother    Dementia Father    Depression Father        and anxiety   Hypertension Father    Sudden death Paternal Uncle 43   Colon cancer Paternal Uncle    Hypertension Brother    Early death Neg Hx    Heart disease Neg Hx        mother in 23s had lesoin   Colon polyps Neg Hx    Esophageal cancer Neg Hx    Rectal cancer Neg Hx    Stomach cancer Neg Hx     Current Outpatient Medications (Endocrine & Metabolic):    clomiPHENE (CLOMID) 50 MG tablet, Take 0.5 tablets (25 mg total) by mouth at bedtime 3 (three) times a week.  Current Outpatient Medications (Cardiovascular):    CARTIA XT 180 MG 24 hr capsule, TAKE 1 CAPSULE BY MOUTH DAILY   hydrochlorothiazide (HYDRODIURIL) 25 MG tablet, Take 1 tablet (25 mg total) by mouth daily.   rosuvastatin (CRESTOR) 5 MG tablet, Take 1 tablet (5 mg total) by mouth daily.   Current Outpatient Medications (Analgesics):    aspirin EC 325 MG tablet, Take 325 mg by mouth daily.   HYDROcodone-acetaminophen (NORCO/VICODIN) 5-325 MG tablet, Take 0.5-1 tablets by mouth 2 (two) times daily as needed. Do not use indicated instructions more than one to three times per week.   Current Outpatient Medications (Other):    Acetylcarnitine HCl (ACETYL L-CARNITINE PO),  Take by mouth.   Ascorbic Acid (VITAMIN C) 1000 MG tablet,    buPROPion (WELLBUTRIN XL) 300 MG 24 hr tablet, TAKE 1 TABLET BY MOUTH DAILY   Cholecalciferol (VITAMIN D) 2000 UNITS CAPS, Take 1 capsule by mouth daily.   clonazePAM (KLONOPIN) 0.5 MG tablet, Take 1 tablet (0.5 mg total) by mouth  2 (two) times daily as needed for anxiety (do not drive for 12 hours after taking).   gabapentin (NEURONTIN) 300 MG capsule, Take 3 capsules (900 mg total) by mouth at bedtime.   methylcellulose (CITRUCEL) oral powder, Use as directed once daily   potassium chloride (KLOR-CON) 10 MEQ tablet, TAKE 3 TABLETS BY MOUTH IN  THE MORNING AND 2 TABLETS  BY MOUTH IN THE AFTERNOON   Reviewed prior external information including notes and imaging from  primary care provider As well as notes that were available from care everywhere and other healthcare systems.  Past medical history, social, surgical and family history all reviewed in electronic medical record.  No pertanent information unless stated regarding to the chief complaint.   Review of Systems:  No headache, visual changes, nausea, vomiting, diarrhea, constipation, dizziness, abdominal pain, skin rash, fevers, chills, night sweats, weight loss, swollen lymph nodes, joint swelling, chest pain, shortness of breath, mood changes. POSITIVE muscle aches, body aches, fatigue  Objective  Blood pressure 130/82, pulse 65, height 5\' 8"  (1.727 m), weight 177 lb (80.3 kg), SpO2 95 %.   General: No apparent distress alert and oriented x3 mood and affect normal, dressed appropriately.  HEENT: Pupils equal, extraocular movements intact  Respiratory: Patient's speak in full sentences and does not appear short of breath  Cardiovascular: No lower extremity edema, non tender, no erythema  Patient's low back does have some loss of lordosis.  Some tenderness to palpation in the paraspinal musculature.  Tightness with Pearlean Brownie right greater than left.    Impression and  Recommendations:    The above documentation has been reviewed and is accurate and complete Judi Saa, DO

## 2022-09-28 ENCOUNTER — Ambulatory Visit: Payer: Medicare Other | Admitting: Family Medicine

## 2022-09-28 ENCOUNTER — Ambulatory Visit (INDEPENDENT_AMBULATORY_CARE_PROVIDER_SITE_OTHER): Payer: Medicare Other

## 2022-09-28 VITALS — BP 130/82 | HR 65 | Ht 68.0 in | Wt 177.0 lb

## 2022-09-28 DIAGNOSIS — J984 Other disorders of lung: Secondary | ICD-10-CM | POA: Diagnosis not present

## 2022-09-28 DIAGNOSIS — R7989 Other specified abnormal findings of blood chemistry: Secondary | ICD-10-CM | POA: Diagnosis not present

## 2022-09-28 DIAGNOSIS — M2550 Pain in unspecified joint: Secondary | ICD-10-CM

## 2022-09-28 DIAGNOSIS — Z85118 Personal history of other malignant neoplasm of bronchus and lung: Secondary | ICD-10-CM | POA: Diagnosis not present

## 2022-09-28 DIAGNOSIS — M549 Dorsalgia, unspecified: Secondary | ICD-10-CM

## 2022-09-28 DIAGNOSIS — G894 Chronic pain syndrome: Secondary | ICD-10-CM

## 2022-09-28 DIAGNOSIS — R079 Chest pain, unspecified: Secondary | ICD-10-CM

## 2022-09-28 DIAGNOSIS — M533 Sacrococcygeal disorders, not elsewhere classified: Secondary | ICD-10-CM

## 2022-09-28 DIAGNOSIS — R0989 Other specified symptoms and signs involving the circulatory and respiratory systems: Secondary | ICD-10-CM | POA: Diagnosis not present

## 2022-09-28 LAB — VITAMIN D 25 HYDROXY (VIT D DEFICIENCY, FRACTURES): VITD: 39.75 ng/mL (ref 30.00–100.00)

## 2022-09-28 LAB — VITAMIN B12: Vitamin B-12: 281 pg/mL (ref 211–911)

## 2022-09-28 LAB — TSH: TSH: 1.88 u[IU]/mL (ref 0.35–5.50)

## 2022-09-28 LAB — SEDIMENTATION RATE: Sed Rate: 8 mm/hr (ref 0–20)

## 2022-09-28 LAB — TESTOSTERONE: Testosterone: 152.05 ng/dL — ABNORMAL LOW (ref 300.00–890.00)

## 2022-09-28 LAB — URIC ACID: Uric Acid, Serum: 5.7 mg/dL (ref 4.0–7.8)

## 2022-09-28 NOTE — Patient Instructions (Addendum)
DHEA 4 weeks then 2 weeks off Do prescribed exercises at least 3x a week Xray today See you again in 2 months

## 2022-09-28 NOTE — Assessment & Plan Note (Addendum)
Has had difficulty with this previously.  Discussed icing regimen discussed with patient about different treatment options.  Patient is concerned that some of it seems to be secondary to the low testosterone again.  We discussed rechecking with it being greater than 3 months.  We also discussed DHEA.  Discussed which activities to do and which ones to avoid.  Increase activity slowly.  Follow-up with me again in 2 months.

## 2022-09-28 NOTE — Assessment & Plan Note (Signed)
Patient is still being treated and has the medications for idiopathic neuropathy from the primary care provider.

## 2022-09-28 NOTE — Assessment & Plan Note (Signed)
Will recheck testosterone.  See how patient responds.

## 2022-09-29 ENCOUNTER — Other Ambulatory Visit: Payer: Self-pay

## 2022-09-29 ENCOUNTER — Encounter: Payer: Self-pay | Admitting: Family Medicine

## 2022-09-29 ENCOUNTER — Other Ambulatory Visit (HOSPITAL_BASED_OUTPATIENT_CLINIC_OR_DEPARTMENT_OTHER): Payer: Self-pay

## 2022-09-29 MED ORDER — CYANOCOBALAMIN 1000 MCG/ML IJ SOLN
1000.0000 ug | INTRAMUSCULAR | 0 refills | Status: DC
  Filled 2022-09-29: qty 4, 28d supply, fill #0

## 2022-09-30 ENCOUNTER — Other Ambulatory Visit (HOSPITAL_BASED_OUTPATIENT_CLINIC_OR_DEPARTMENT_OTHER): Payer: Self-pay

## 2022-10-01 ENCOUNTER — Encounter: Payer: Self-pay | Admitting: Family Medicine

## 2022-10-01 DIAGNOSIS — H34232 Retinal artery branch occlusion, left eye: Secondary | ICD-10-CM | POA: Diagnosis not present

## 2022-10-04 LAB — PTH, INTACT AND CALCIUM: Calcium: 9.8 mg/dL (ref 8.6–10.3)

## 2022-10-04 LAB — DHEA

## 2022-10-05 DIAGNOSIS — M2550 Pain in unspecified joint: Secondary | ICD-10-CM | POA: Diagnosis not present

## 2022-10-11 LAB — PTH, INTACT AND CALCIUM
Calcium: 9.7 mg/dL (ref 8.6–10.3)
PTH: 32 pg/mL (ref 16–77)

## 2022-10-11 LAB — DHEA: DHEA: 207 ng/dL (ref 147–1760)

## 2022-10-11 NOTE — Progress Notes (Signed)
[facility] Summary Report / Loop Recorder 

## 2022-10-25 ENCOUNTER — Ambulatory Visit (INDEPENDENT_AMBULATORY_CARE_PROVIDER_SITE_OTHER): Payer: Medicare Other

## 2022-10-25 DIAGNOSIS — G459 Transient cerebral ischemic attack, unspecified: Secondary | ICD-10-CM | POA: Diagnosis not present

## 2022-10-26 LAB — CUP PACEART REMOTE DEVICE CHECK
Date Time Interrogation Session: 20240719230414
Implantable Pulse Generator Implant Date: 20240305

## 2022-10-27 ENCOUNTER — Encounter: Payer: Self-pay | Admitting: Family Medicine

## 2022-10-27 ENCOUNTER — Other Ambulatory Visit (HOSPITAL_BASED_OUTPATIENT_CLINIC_OR_DEPARTMENT_OTHER): Payer: Self-pay

## 2022-10-27 ENCOUNTER — Ambulatory Visit (INDEPENDENT_AMBULATORY_CARE_PROVIDER_SITE_OTHER): Payer: Medicare Other | Admitting: Family Medicine

## 2022-10-27 VITALS — BP 120/64 | HR 63 | Temp 98.2°F | Ht 68.0 in | Wt 177.0 lb

## 2022-10-27 DIAGNOSIS — E785 Hyperlipidemia, unspecified: Secondary | ICD-10-CM | POA: Diagnosis not present

## 2022-10-27 DIAGNOSIS — Z131 Encounter for screening for diabetes mellitus: Secondary | ICD-10-CM

## 2022-10-27 DIAGNOSIS — Z79899 Other long term (current) drug therapy: Secondary | ICD-10-CM

## 2022-10-27 DIAGNOSIS — F3341 Major depressive disorder, recurrent, in partial remission: Secondary | ICD-10-CM

## 2022-10-27 DIAGNOSIS — I10 Essential (primary) hypertension: Secondary | ICD-10-CM | POA: Diagnosis not present

## 2022-10-27 DIAGNOSIS — G894 Chronic pain syndrome: Secondary | ICD-10-CM

## 2022-10-27 DIAGNOSIS — R739 Hyperglycemia, unspecified: Secondary | ICD-10-CM

## 2022-10-27 LAB — CBC WITH DIFFERENTIAL/PLATELET
Basophils Absolute: 0 10*3/uL (ref 0.0–0.1)
Basophils Relative: 0.4 % (ref 0.0–3.0)
Eosinophils Absolute: 0.2 10*3/uL (ref 0.0–0.7)
Eosinophils Relative: 3.8 % (ref 0.0–5.0)
HCT: 44.5 % (ref 39.0–52.0)
Hemoglobin: 15 g/dL (ref 13.0–17.0)
Lymphocytes Relative: 24.2 % (ref 12.0–46.0)
Lymphs Abs: 1.6 10*3/uL (ref 0.7–4.0)
MCHC: 33.7 g/dL (ref 30.0–36.0)
MCV: 94.8 fl (ref 78.0–100.0)
Monocytes Absolute: 0.8 10*3/uL (ref 0.1–1.0)
Monocytes Relative: 11.8 % (ref 3.0–12.0)
Neutro Abs: 3.9 10*3/uL (ref 1.4–7.7)
Neutrophils Relative %: 59.8 % (ref 43.0–77.0)
Platelets: 236 10*3/uL (ref 150.0–400.0)
RBC: 4.69 Mil/uL (ref 4.22–5.81)
RDW: 12.3 % (ref 11.5–15.5)
WBC: 6.6 10*3/uL (ref 4.0–10.5)

## 2022-10-27 LAB — COMPREHENSIVE METABOLIC PANEL
ALT: 20 U/L (ref 0–53)
AST: 21 U/L (ref 0–37)
Albumin: 4.5 g/dL (ref 3.5–5.2)
Alkaline Phosphatase: 88 U/L (ref 39–117)
BUN: 16 mg/dL (ref 6–23)
CO2: 30 mEq/L (ref 19–32)
Calcium: 9.9 mg/dL (ref 8.4–10.5)
Chloride: 100 mEq/L (ref 96–112)
Creatinine, Ser: 0.91 mg/dL (ref 0.40–1.50)
GFR: 84.24 mL/min (ref >60.00)
Glucose, Bld: 108 mg/dL — ABNORMAL HIGH (ref 70–99)
Potassium: 3.8 mEq/L (ref 3.5–5.1)
Sodium: 139 mEq/L (ref 135–145)
Total Bilirubin: 0.7 mg/dL (ref 0.2–1.2)
Total Protein: 6.9 g/dL (ref 6.0–8.3)

## 2022-10-27 LAB — LDL CHOLESTEROL, DIRECT: Direct LDL: 57 mg/dL

## 2022-10-27 LAB — HEMOGLOBIN A1C: Hgb A1c MFr Bld: 5.7 % (ref 4.6–6.5)

## 2022-10-27 MED ORDER — HYDROCODONE-ACETAMINOPHEN 5-325 MG PO TABS
0.5000 | ORAL_TABLET | Freq: Two times a day (BID) | ORAL | 0 refills | Status: DC | PRN
  Filled 2022-10-27: qty 55, 84d supply, fill #0
  Filled 2022-10-27: qty 55, 28d supply, fill #0

## 2022-10-27 MED ORDER — PREDNISONE 20 MG PO TABS
ORAL_TABLET | ORAL | 0 refills | Status: AC
  Filled 2022-10-27: qty 10, 7d supply, fill #0

## 2022-10-27 NOTE — Patient Instructions (Addendum)
Please stop by lab before you go If you have mychart- we will send your results within 3 business days of [place] receiving them.  If you do not have mychart- we will call you about results within 5 business days of [place] receiving them.  *please also note that you will see labs on mychart as soon as they post. I will later go in and write notes on them- will say "notes from Dr. Durene Cal"   with prior dust exposure we opted to try prednisone in case there is an allergic element. Did potentially have asbetosis exposure and considered referral to pulmonary but we opted to do this if fails to improve. Lungs clear today and had CXR since exposure already.  He will reach out to [place] if not significantly better within 2-3 weeks or if worsens  Recommended follow up: Return in about 4 months (around 02/27/2023) for followup or sooner if needed.Schedule b4 you leave. Can do physical then if they have available- if not can schedule for the following 4 month visit

## 2022-10-27 NOTE — Progress Notes (Signed)
Phone [phone] In person visit   Subjective:   [name] Page is a 72 y.o. year old very pleasant male patient who presents for/with See problem oriented charting Chief Complaint  Patient presents with   Follow-up    About a month ago he exposed to dust and he is having congestion and sore throat.   Past Medical History-  Patient Active Problem List   Diagnosis Date Noted   Chronic pain syndrome 08/31/2016    Priority: High   History of lung cancer 12/11/2012    Priority: High   Vitamin D deficiency 02/11/2022    Priority: Medium    Aortic atherosclerosis (HCC) 11/03/2020    Priority: Medium    History of adenomatous polyp of colon 03/24/2018    Priority: Medium    Generalized anxiety disorder 03/12/2014    Priority: Medium    Essential hypertension 03/12/2014    Priority: Medium    Peripheral neuropathy, idiopathic 12/22/2012    Priority: Medium    Dyspnea 07/14/2012    Priority: Medium    Hypertriglyceridemia 07/14/2012    Priority: Medium    Depression 02/16/2010    Priority: Medium    Low testosterone 11/21/2007    Priority: Medium    Chronic bilateral low back pain 06/05/2019    Priority: Low   Venous stasis of both lower extremities 06/05/2019    Priority: Low   Mastoiditis 03/09/2017    Priority: Low   Palpitations 08/11/2016    Priority: Low   Eustachian tube dysfunction 12/17/2014    Priority: Low   SI (sacroiliac) joint dysfunction 09/19/2013    Priority: Low   Nonspecific abnormal electrocardiogram (ECG) (EKG) 07/14/2012    Priority: Low   Allergic reaction, history of 10/25/2011    Priority: Low   Rectal or anal pain 10/01/2010    Priority: Low   Allergic rhinitis 06/13/2007    Priority: Low   GERD 06/13/2007    Priority: Low   IBS (irritable bowel syndrome) 06/13/2007    Priority: Low   Recurrent major depressive disorder, in partial remission (HCC) 06/16/2022   Nonallopathic lesion of sacral region 09/19/2013   Nonallopathic lesion  of thoracic region 09/19/2013   Nonallopathic lesion of lumbosacral region 09/19/2013    Medications- reviewed and updated Current Outpatient Medications  Medication Sig Dispense Refill   Acetylcarnitine HCl (ACETYL L-CARNITINE PO) Take by mouth.     Ascorbic Acid (VITAMIN C) 1000 MG tablet      aspirin EC 325 MG tablet Take 325 mg by mouth daily.     buPROPion (WELLBUTRIN XL) 300 MG 24 hr tablet TAKE 1 TABLET BY MOUTH DAILY 90 tablet 3   CARTIA XT 180 MG 24 hr capsule TAKE 1 CAPSULE BY MOUTH DAILY 100 capsule 2   Cholecalciferol (VITAMIN D) 2000 UNITS CAPS Take 1 capsule by mouth daily.     clomiPHENE (CLOMID) 50 MG tablet Take 0.5 tablets (25 mg total) by mouth at bedtime 3 (three) times a week. 12 tablet 1   clonazePAM (KLONOPIN) 0.5 MG tablet Take 1 tablet (0.5 mg total) by mouth 2 (two) times daily as needed for anxiety (do not drive for 12 hours after taking). 180 tablet 1   cyanocobalamin (VITAMIN B12) 1000 MCG/ML injection Inject 1mL once weekly as directed for 4 weeks. 10 mL 0   gabapentin (NEURONTIN) 300 MG capsule Take 3 capsules (900 mg total) by mouth at bedtime. 300 capsule 3   hydrochlorothiazide (HYDRODIURIL) 25 MG tablet Take 1 tablet (25  mg total) by mouth daily. 90 tablet 3   methylcellulose (CITRUCEL) oral powder Use as directed once daily     potassium chloride (KLOR-CON) 10 MEQ tablet TAKE 3 TABLETS BY MOUTH IN  THE MORNING AND 2 TABLETS  BY MOUTH IN THE AFTERNOON 500 tablet 2   rosuvastatin (CRESTOR) 5 MG tablet Take 1 tablet (5 mg total) by mouth daily. 90 tablet 3   HYDROcodone-acetaminophen (NORCO/VICODIN) 5-325 MG tablet Take 0.5-1 tablets by mouth 2 (two) times daily as needed (No more than 4x a week use. 3 month supply). 55 tablet 0   No current facility-administered medications for this visit.     Objective:  BP 120/64   Pulse 63   Temp 98.2 F (36.8 C)   Ht 5\' 8"  (1.727 m)   Wt 177 lb (80.3 kg)   SpO2 96%   BMI 26.91 kg/m  Gen: NAD, resting  comfortably Tympanic membrane normal, slight drainage in pharynx- mild edema in nasal turbinates. No cervical lymphadenopathy CV: RRR no murmurs rubs or gallops Lungs: CTAB no crackles, wheeze, rhonchi Ext: no edema Skin: warm, dry     Assessment and Plan   # dust exposure about a month ago S:he reports since that time that he has felt congested and more sore throat - had x-ray with Dr. Katrinka Blazing on 09/28/22 which was reassuring. Mild improvement after that then worsened again. Mainly dry cough but feels congested in chest. Eyes somewhat irritated and gets sore throat at times. Did COVID test yesterday but negative -popcorn ceiling with asbestos - plumber came in after a leak in house- tore ceiling down and didn't clean it up and he cleaned it up- wore a mask but was in there most of day- feels may have been exposed. Some nasal congestion as well particularly at night A/P: with prior dust exposure we opted to try prednisone in case there is an allergic element. Did potentially have asbetosis exposure and considered referral to pulmonary but we opted to do this if fails to improve. Lungs clear today and had CXR since exposure already.  He will reach out to [place] if not significantly better within 2-3 weeks -strep test declines  #History of lung cancer/actually carcinoid tumor malignant-we follow an annual chest x-ray on him.  Last chest x-ray 09/28/22.  Also prior with CT chest abdomen pelvis 03/05/2020 and CT calcium 07/08/20.    #Chronic pain syndrome/neuropathy S: Medication: Gabapentin 600- 900 mg at bedtime, sparing hydrocodone 3-4x a week.  He knows to space hydrocodone and clonazepam at least 12 hours  -Does stationary bike for exercise as far to be on his feet for prolonged periods- more treadmill lately and light weigths  A/P: chronic pain/neuropathy ongoing issue- but stable- continue current medications and provide refill on hydrocodone for 3 months supply today  -PDMP reviewed and and low risk   -UDS last updated 08/26/21- update today -Controlled substance contract on file from4/08/2017  #hypertension/hypokalemia requiring potassium 20 mEq twice daily S: medication: hctz 25 mg, diltiazem 180 mg extended release (amlodipine caused edema in past).  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 Rosanne Gutting with left visual field disturbance in July 2024)-plans on loop recorder with Dr. Lalla Brothers #microvascular ischemia on  prior MRI # Hyperlipidemia/hypertriglyceridemia. LPA not elevated # Aortic atherosclerosis-prior coronary artery calcium score of 0 on 07/08/2020 S: Medication:Atorvastatin 40 mg--> 10mg  (patient concern about neuropathy)--> rosuvastatin 5 mg 3x a week, asa 325 mg Lab Results  Component Value Date   CHOL 132 02/11/2022   HDL 50.90 02/11/2022   LDLCALC 58 02/11/2022   LDLDIRECT 56.0 06/16/2022   TRIG 116.0 02/11/2022   CHOLHDL 3 02/11/2022   A/P: with history of brao- remain on aspirin With lipids prefer LDL under 70 with history of brao- check LDL today as reports taking rosuvastatin less frequently    # Depression/GAD S: Medication:Wellbutrin 300 mg XL .  Uses clonazepam 1-2 times a day-we have discussed trying to cut back- mainly before dinner right now.  Has not tolerated SSRIs due to palpitations    10/27/2022   11:00 AM 06/28/2022   10:11 AM 06/16/2022   10:11 AM  Depression screen PHQ 2/9  Decreased Interest 1 0 1  Down, Depressed, Hopeless 1 0 1  PHQ - 2 Score 2 0 2  Altered sleeping 2 0 1  Tired, decreased energy 2 0 1  Change in appetite 1 0 0  Feeling bad or failure about yourself  1 0 0  Trouble concentrating 1 0 0  Moving slowly or fidgety/restless 1  0 1  Suicidal thoughts 0 0 0  PHQ-9 Score 10 0 5  Difficult doing work/chores Somewhat difficult Not difficult at all Somewhat difficult  A/P: angst is higher with recent exposure/cough- he prefers to focus on improving this instead of adjusting medications at this time- continue current medications for now- if worsens he agrees to let us know  #Hypogonadism-on testosterone treatment in the past- stopped after BRAO in January 2024 but is now prescribed clomid (but has not taken yet) through Dr. Lucianne Muss but testosterone still low- upcoming visit to discuss  # Hyperglycemia/insulin resistance/prediabetes-A1c 5.9 in 2023  S:  Medication: none. 109 fasting and 119 fasting recently Lab Results  Component Value Date   HGBA1C 5.6 04/19/2022   HGBA1C 5.9 02/11/2022   HGBA1C 5.6 12/17/2021  A/P: hopefully stable- update a1c today. Continue without meds for now   #osteopenia noted on x-ray June 2024- offered DEXA- he wants to hold for now  Recommended follow up: Return in about 4 months (around 02/27/2023) for physical or sooner if needed.Schedule b4 you leave. Future Appointments  Date Time Provider Department Center  11/05/2022 10:45 AM Reather Littler, MD [facility] None  11/29/2022  7:20 AM [facility] DEVICE REMOTES [facility]  12/01/2022 10:30 AM Judi Saa, DO [facility] None  01/03/2023  7:20 AM [facility] DEVICE REMOTES [facility]  02/07/2023  7:20 AM [facility] DEVICE REMOTES [facility]  03/14/2023  7:20 AM [facility] DEVICE REMOTES [facility]  04/18/2023  7:20 AM [facility] DEVICE REMOTES [facility]  05/23/2023  7:20 AM [facility] DEVICE REMOTES [facility]  07/04/2023 10:00 AM [facility] ANNUAL WELLNESS VISIT 1 [facility]   Lab/Order associations:   ICD-10-CM   1. Essential hypertension  I10 Comprehensive metabolic panel    CBC with Differential/Platelet    2. Recurrent major depressive disorder, in  partial remission (HCC)  F33.41     3. Chronic pain syndrome  G89.4     4. High risk medication use  Z79.899 DRUG MONITORING, PANEL 8 WITH CONFIRMATION, URINE    5. Hyperlipidemia, unspecified hyperlipidemia type  E78.5 Comprehensive metabolic panel    CBC with Differential/Platelet    LDL cholesterol, direct  6. Screening for diabetes mellitus  Z13.1 HgB A1c    7. Hyperglycemia  R73.9 HgB A1c      Meds ordered this encounter  Medications   HYDROcodone-acetaminophen (NORCO/VICODIN) 5-325 MG tablet    Sig: Take 0.5-1 tablets by mouth 2 (two) times daily as needed (No more than 4x a week use. 3 month supply).    Dispense:  55 tablet    Refill:  0   predniSONE (DELTASONE) 20 MG tablet    Sig: Take 2 tablets by mouth daily for 3 days, then 1 tablet dailyl for 4 days    Dispense:  10 tablet    Refill:  0    Return precautions advised.  Tana Conch, MD

## 2022-10-29 LAB — DRUG MONITORING, PANEL 8 WITH CONFIRMATION, URINE: Alcohol Metabolites: NEGATIVE ng/mL (ref <500)

## 2022-11-01 ENCOUNTER — Encounter: Payer: Self-pay | Admitting: Cardiology

## 2022-11-05 ENCOUNTER — Telehealth (INDEPENDENT_AMBULATORY_CARE_PROVIDER_SITE_OTHER): Payer: Medicare Other | Admitting: Endocrinology

## 2022-11-05 DIAGNOSIS — E291 Testicular hypofunction: Secondary | ICD-10-CM | POA: Diagnosis not present

## 2022-11-05 DIAGNOSIS — R7301 Impaired fasting glucose: Secondary | ICD-10-CM | POA: Diagnosis not present

## 2022-11-05 NOTE — Progress Notes (Unsigned)
Patient ID: [name] Page, male   DOB: 1950/04/07, 72 y.o.   MRN: [id number]            Chief complaint: Endocrinology follow-up  I connected with the above-named patient by video enabled telemedicine application and verified that I am speaking with the correct person. The patient was explained the limitations of evaluation and management by telemedicine and the availability of in person appointments.  Patient also understood that there may be a patient responsible charge related to this service . Location of the patient: Patient's home . Location of the provider: Physician office Only the patient and myself were participating in the encounter The patient understood the above statements and agreed to proceed.   History of Present Illness   HYPOGONADISM:  Hypogonadism was diagnosed in ?  2012  He at that time had complaints of fatigue, some increased depression, decreased libido although he does not remember symptoms well There is no history of the following: Hot flushes, sweats, breast enlargement, long term anabolic steroid use, history of testicular injury, head injury or mumps in childhood.  No history of osteopenia or low impact fracture  At that time an afternoon testosterone level was 214 and free testosterone level low at 32, normal >47         He was also evaluated by a urologist and details are not available He was treated with testosterone gel but he thinks this did not improve his testosterone levels and likely did not continue treatment  Subsequently he was again evaluated in 2015 and with a low testosterone of 239 he was started on testosterone injections With this he thinks she had some improvement in his fatigue and depression His dose had been adjusted from his initial starting dose of 100 mg every 2 weeks and he has been on various regimens including every 3 or 4 weeks injections His testosterone level had been quite variable since about 2016  RECENT  history:  He was taking 30 mg of testosterone weekly Because of his stroke his testosterone supplementation was stopped in January 2024 by his neurologist and apparently no specific cause of stroke was documented  Since then he has been complaining of fatigue which is fairly consistent now  Testosterone level is most recently 152 although nonfasting Previously on testosterone injections testosterone level was as high as 575 Hemoglobin was high normal while on injections  His lab results show testosterone levels as follows:   Lab Results  Component Value Date   TESTOSTERONE 152.05 (L) 09/28/2022   TESTOSTERONE 148.47 (L) 06/16/2022   TESTOSTERONE 575.02 02/11/2022   TESTOSTERONE 399.85 08/17/2021   Lab Results  Component Value Date   HGB 15.0 10/27/2022    Prolactin level: Normal   Lab Results  Component Value Date   LH <0.20 (L) 04/07/2017   Lab Results  Component Value Date   HGB 15.0 10/27/2022   HGB 16.8 04/13/2022   HGB 16.3 02/11/2022   HGB 17.0 12/15/2021              Allergies as of 11/05/2022       Reactions   Bee Venom Anaphylaxis   Colestipol Diarrhea, Other (See Comments)   Epinephrine Other (See Comments)   Flagyl [metronidazole Hcl] Other (See Comments)   Neuropathy    Ibuprofen Other (See Comments)   Nsaids    REACTION: intolereance- ABD PAIN AND DIARRHEA    Protonix [pantoprazole]    Abdominal Pain and Diarrhea  Medication List        Accurate as of November 05, 2022 10:38 AM. If you have any questions, ask your nurse or doctor.          STOP taking these medications    cyanocobalamin 1000 MCG/ML injection Commonly known as: VITAMIN B12       TAKE these medications    ACETYL L-CARNITINE PO Take by mouth.   aspirin EC 325 MG tablet Take 325 mg by mouth daily.   buPROPion 300 MG 24 hr tablet Commonly known as: WELLBUTRIN XL TAKE 1 TABLET BY MOUTH DAILY   Cartia XT 180 MG 24 hr capsule Generic drug: diltiazem TAKE  1 CAPSULE BY MOUTH DAILY   Citrucel oral powder Generic drug: methylcellulose Use as directed once daily   clomiPHENE 50 MG tablet Commonly known as: CLOMID Take 0.5 tablets (25 mg total) by mouth at bedtime 3 (three) times a week.   clonazePAM 0.5 MG tablet Commonly known as: KLONOPIN Take 1 tablet (0.5 mg total) by mouth 2 (two) times daily as needed for anxiety (do not drive for 12 hours after taking).   gabapentin 300 MG capsule Commonly known as: NEURONTIN Take 3 capsules (900 mg total) by mouth at bedtime.   hydrochlorothiazide 25 MG tablet Commonly known as: HYDRODIURIL Take 1 tablet (25 mg total) by mouth daily.   HYDROcodone-acetaminophen 5-325 MG tablet Commonly known as: NORCO/VICODIN Take 0.5-1 tablets by mouth 2 (two) times daily as needed (No more than 4x a week use. 3 month supply).   potassium chloride 10 MEQ tablet Commonly known as: KLOR-CON TAKE 3 TABLETS BY MOUTH IN  THE MORNING AND 2 TABLETS  BY MOUTH IN THE AFTERNOON   rosuvastatin 5 MG tablet Commonly known as: Crestor Take 1 tablet (5 mg total) by mouth daily.   vitamin C 1000 MG tablet   Vitamin D 50 MCG (2000 UT) Caps Take 1 capsule by mouth daily.        Allergies:  Allergies  Allergen Reactions  . Bee Venom Anaphylaxis  . Colestipol Diarrhea and Other (See Comments)  . Epinephrine Other (See Comments)  . Flagyl [Metronidazole Hcl] Other (See Comments)    Neuropathy   . Ibuprofen Other (See Comments)  . Nsaids     REACTION: intolereance- ABD PAIN AND DIARRHEA   . Protonix [Pantoprazole]     Abdominal Pain and Diarrhea    Past Medical History:  Diagnosis Date  . Allergic rhinitis, cause unspecified    MILD  . Anxiety   . Arthritis    OA  . Cancer (HCC)    lung tumor - NO CHEMO NO RADIATION  . 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 Cataract    forming  . Depressive disorder, not elsewhere classified   . Diverticulitis    X 1 EPISODE  . Esophageal reflux     past hx   . HTN (hypertension)   . Hyperlipidemia    CONTROLLED  . Lymphocytic colitis   . Neuromuscular disorder (HCC)    neuropathy  . Neuropathy   . Other testicular hypofunction   . Palpitations   . Personal history of urinary calculi    LAST IN THE 80'S    Past Surgical History:  Procedure Laterality Date  . COLONOSCOPY    . 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 parotid tumor surgery     [place] [facility]  . POLYPECTOMY    .  TONSILLECTOMY AND ADENOIDECTOMY      Family History  Problem Relation Age of Onset  . Hypertension Mother   . Anxiety disorder Mother   . Non-Hodgkin's lymphoma Mother   . Dementia Father   . Depression Father        and anxiety  . Hypertension Father   . Sudden death Paternal Uncle 66  . Colon cancer Paternal Uncle   . Hypertension Brother   . Early death Neg Hx   . Heart disease Neg Hx        mother in 90s had lesoin  . Colon polyps Neg Hx   . Esophageal cancer Neg Hx   . Rectal cancer Neg Hx   . Stomach cancer Neg Hx     Social History:  reports that he has never smoked. He has never used smokeless tobacco. He reports current alcohol use. He reports that he does not use drugs.  Review of Systems  IMPAIRED fasting glucose:   Although his A1c is normal usually his fasting glucose may be occasionally high  Fasting glucose on his glucose tolerance test done on 03/31/18 was 105 but 2-hour reading was only 103  Most recent nonfasting glucose was 108 With a Contour meter at home has fasting readings range from 100-110 generally  He has lost some weight this year with better diet and exercise regimen  Wt Readings from Last 3 Encounters:  10/27/22 177 lb (80.3 kg)  09/28/22 177 lb (80.3 kg)  06/28/22 170 lb 9.6 oz (77.4 kg)   Lab Results  Component Value Date   GLUCOSE 108 (H) 10/27/2022   GLUCOSE 106 (H) 06/16/2022   GLUCOSE 131 (H) 04/13/2022   Lab Results  Component  Value Date   HGBA1C 5.7 10/27/2022   HGBA1C 5.6 04/19/2022   HGBA1C 5.9 02/11/2022   Lab Results  Component Value Date   LDLCALC 58 02/11/2022   CREATININE 0.91 10/27/2022    General Examination:   There were no vitals taken for this visit.     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  He was on treatment with Depo-Testosterone once a week and previously doing subjectively well  He has fatigue with stopping the testosterone about 4 months ago because of his drug Testosterone level is significantly low as expected  IMPAIRED fasting glucose with A1c normal, likely improved with weight loss  Plan:  No treatment for hypogonadism at this point He will reconsider using Natesto but wants to wait till January to do this Will need to evaluate LH and testosterone before starting this and likely can start with a smaller dose of twice a day Natesto with close follow-up  Encouraged him to stay active with exercise and monitor fasting glucose periodically with PCP  Reather Littler 11/05/2022, 10:38 AM     Note: This office note was prepared with Dragon voice recognition system technology. Any transcriptional errors that result from this process are unintentional.

## 2022-11-07 ENCOUNTER — Encounter: Payer: Self-pay | Admitting: Endocrinology

## 2022-11-11 ENCOUNTER — Other Ambulatory Visit (HOSPITAL_BASED_OUTPATIENT_CLINIC_OR_DEPARTMENT_OTHER): Payer: Self-pay

## 2022-11-12 NOTE — Progress Notes (Signed)
[facility] Summary Report / Loop Recorder 

## 2022-11-25 LAB — CUP PACEART REMOTE DEVICE CHECK
Date Time Interrogation Session: 20240821231107
Implantable Pulse Generator Implant Date: 20240305

## 2022-11-29 ENCOUNTER — Ambulatory Visit (INDEPENDENT_AMBULATORY_CARE_PROVIDER_SITE_OTHER): Payer: Medicare Other

## 2022-11-29 DIAGNOSIS — G459 Transient cerebral ischemic attack, unspecified: Secondary | ICD-10-CM

## 2022-11-30 NOTE — Progress Notes (Unsigned)
[facility] [address] [place] [address] Phone: [phone] Subjective:   [name] Page, am serving as a scribe for Dr. Antoine Primas.  I'm seeing this patient by the request  of:  Shelva Majestic, MD  CC: Low back pain, energy  [id number]  09/28/2022 Will recheck testosterone.  See how patient responds.     Patient is still being treated and has the medications for idiopathic neuropathy from the primary care provider.   Has had difficulty with this previously.  Discussed icing regimen discussed with patient about different treatment options.  Patient is concerned that some of it seems to be secondary to the low testosterone again.  We discussed rechecking with it being greater than 3 months.  We also discussed DHEA.  Discussed which activities to do and which ones to avoid.  Increase activity slowly.  Follow-up with me again in 2 months.      Update 12/01/2022 [name] Page is a 72 y.o. male coming in with complaint of SI joint pain. Patient states that he has had intermittent pain but overall is doing a lot better.  Patient is feeling like himself somewhat.  Still having some difficulty with getting more activity.  Going to be seeing primary care provider relatively soon for testosterone.  Thinking about potentially restarting testosterone after a year after the stroke.      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   Cataract    forming   Depressive disorder, not elsewhere classified    Diverticulitis    X 1 EPISODE   Esophageal reflux    past hx    HTN (hypertension)    Hyperlipidemia    CONTROLLED   Lymphocytic colitis    Neuromuscular disorder (HCC)    neuropathy   Neuropathy    Other testicular hypofunction    Palpitations     Personal history of urinary calculi    LAST IN THE 80'S   Past Surgical History:  Procedure Laterality Date   COLONOSCOPY     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   [name]   parotid tumor surgery     [place]   POLYPECTOMY     TONSILLECTOMY AND ADENOIDECTOMY     Social History   Socioeconomic History   Marital status: Married    Spouse name: Not on file   Number of children: 0   Years of education: 18   Highest education level: Not on file  Occupational History   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 Married 30 +  years. No children, lots of critters.       Cancer Survivor- carcinoid lung cancer.   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 Micah Flesher back to work for ability to be insured.       Hobbies: music, home repair/improvement, cats   Social Determinants of Health   Financial Resource Strain: Low Risk  (06/19/2021)   Overall Financial Resource Strain (CARDIA)    Difficulty of Paying Living Expenses: Not hard at all  Food Insecurity: No Food Insecurity (06/27/2022)   Hunger Vital Sign    Worried About Running Out of Food in the Last Year: Never true    Ran Out of Food in the Last Year: Never true  Transportation Needs: No Transportation Needs (06/27/2022)   PRAPARE - Administrator, Civil Service (Medical): No    Lack of Transportation (Non-Medical): No   Physical Activity: Sufficiently Active (06/27/2022)   Exercise Vital Sign    Days of Exercise per Week: 5 days    Minutes of Exercise per Session: 50 min  Stress: No Stress Concern Present (06/27/2022)   [employer] of Occupational Health - Occupational Stress Questionnaire    Feeling of Stress : Only a little  Social Connections: Unknown (06/27/2022)   Social Connection and Isolation Panel [NHANES]    Frequency of Communication with Friends and Family: Patient declined    Frequency of Social Gatherings with Friends and Family: Patient declined    Attends Religious Services: Never    Database administrator or Organizations: No    Attends Engineer, structural: Never    Marital Status: Married   Allergies  Allergen Reactions   Bee Venom Anaphylaxis   Colestipol Diarrhea and Other (See Comments)   Epinephrine Other (See Comments)   Flagyl [Metronidazole Hcl] Other (See Comments)    Neuropathy    Ibuprofen Other (See Comments)   Nsaids     REACTION: intolereance- ABD PAIN AND DIARRHEA    Protonix [Pantoprazole]     Abdominal Pain and Diarrhea   Family History  Problem Relation Age of Onset   Hypertension Mother    Anxiety disorder Mother    Non-Hodgkin's lymphoma Mother    Dementia Father    Depression Father        and anxiety   Hypertension Father    Sudden death Paternal Uncle 70   Colon cancer Paternal Uncle    Hypertension Brother    Early death Neg Hx    Heart disease Neg Hx        mother in 30s had lesoin   Colon polyps Neg Hx    Esophageal cancer Neg Hx    Rectal cancer Neg Hx    Stomach cancer Neg Hx      Current Outpatient Medications (Cardiovascular):    CARTIA XT 180 MG 24 hr capsule, TAKE 1 CAPSULE BY MOUTH DAILY   hydrochlorothiazide (HYDRODIURIL) 25 MG tablet, Take 1 tablet (25 mg total) by mouth daily.   rosuvastatin (CRESTOR) 5 MG tablet, Take 1 tablet (5 mg total) by mouth daily.  Current Outpatient Medications (Respiratory):     beclomethasone (QVAR REDIHALER) 80 MCG/ACT inhaler, Inhale 1 puff into the lungs 2 (two) times daily.  Current Outpatient Medications (Analgesics):    aspirin EC 325 MG tablet, Take 325 mg by mouth daily.   HYDROcodone-acetaminophen (NORCO/VICODIN) 5-325 MG tablet, Take 0.5-1 tablets by mouth 2 (two) times daily as needed (No more than 4x a week use.  3 month supply).  Current Outpatient Medications (Hematological):    Cyanocobalamin (B-12) 2500 MCG TABS, Take by mouth.  Current Outpatient Medications (Other):    Acetylcarnitine HCl (ACETYL L-CARNITINE PO), Take by mouth.   Ascorbic Acid (VITAMIN C) 1000 MG tablet,    buPROPion (WELLBUTRIN XL) 300 MG 24 hr tablet, TAKE 1 TABLET BY MOUTH DAILY   Cholecalciferol (VITAMIN D) 2000 UNITS CAPS, Take 1 capsule by mouth daily.   clonazePAM (KLONOPIN) 0.5 MG tablet, Take 1 tablet (0.5 mg total) by mouth 2 (two) times daily as needed for anxiety (do not drive for 12 hours after taking).   gabapentin (NEURONTIN) 300 MG capsule, Take 3 capsules (900 mg total) by mouth at bedtime.   methylcellulose (CITRUCEL) oral powder, Use as directed once daily   potassium chloride (KLOR-CON) 10 MEQ tablet, TAKE 3 TABLETS BY MOUTH IN  THE MORNING AND 2 TABLETS  BY MOUTH IN THE AFTERNOON   Reviewed prior external information including notes and imaging from  primary care provider As well as notes that were available from care everywhere and other healthcare systems.  Past medical history, social, surgical and family history all reviewed in electronic medical record.  No pertanent information unless stated regarding to the chief complaint.   Review of Systems:  No headache, visual changes, nausea, vomiting, diarrhea, constipation, dizziness, abdominal pain, skin rash, fevers, chills, night sweats, weight loss, swollen lymph nodes, body aches, joint swelling, chest pain, shortness of breath, mood changes. POSITIVE muscle aches, fatigue  Objective  Blood pressure  124/80, pulse 62, height 5\' 8"  (1.727 m), weight 178 lb (80.7 kg), SpO2 98%.   General: No apparent distress alert and oriented x3 mood and affect normal, dressed appropriately.  HEENT: Pupils equal, extraocular movements intact  Respiratory: Patient's speak in full sentences and does not appear short of breath  Cardiovascular: No lower extremity edema, non tender, no erythema  Patient is sitting relatively comfortably at the moment.    Impression and Recommendations:     The above documentation has been reviewed and is accurate and complete Judi Saa, DO

## 2022-12-01 ENCOUNTER — Encounter: Payer: Self-pay | Admitting: Family Medicine

## 2022-12-01 ENCOUNTER — Other Ambulatory Visit (HOSPITAL_BASED_OUTPATIENT_CLINIC_OR_DEPARTMENT_OTHER): Payer: Self-pay

## 2022-12-01 ENCOUNTER — Ambulatory Visit: Payer: Medicare Other | Admitting: Family Medicine

## 2022-12-01 VITALS — BP 124/80 | HR 62 | Ht 68.0 in | Wt 178.0 lb

## 2022-12-01 DIAGNOSIS — M2550 Pain in unspecified joint: Secondary | ICD-10-CM

## 2022-12-01 DIAGNOSIS — R7989 Other specified abnormal findings of blood chemistry: Secondary | ICD-10-CM

## 2022-12-01 MED ORDER — QVAR REDIHALER 80 MCG/ACT IN AERB
1.0000 | INHALATION_SPRAY | Freq: Two times a day (BID) | RESPIRATORY_TRACT | 1 refills | Status: DC
  Filled 2022-12-01: qty 10.6, 30d supply, fill #0

## 2022-12-01 NOTE — Patient Instructions (Addendum)
Work up to 50mg  DHEA 4 weeks on 2 weeks off Labs in 3 weeks See me in 2 months just in case

## 2022-12-01 NOTE — Assessment & Plan Note (Signed)
Recheck lab at the moment.  Discussed need supplementation and potentially the new ones as well.  Patient will consider this after the new year and after a year of the status post stroke.  Patient was feeling much better when he was on that.  Is going to see if the natural supplementations are still can be helpful.  Follow-up with me again in 6 to 8 weeks otherwise.

## 2022-12-08 NOTE — Progress Notes (Signed)
[facility] Summary Report / Loop Recorder 

## 2022-12-16 ENCOUNTER — Encounter: Payer: Self-pay | Admitting: Family Medicine

## 2022-12-16 ENCOUNTER — Other Ambulatory Visit: Payer: Self-pay | Admitting: Family Medicine

## 2022-12-16 ENCOUNTER — Other Ambulatory Visit (HOSPITAL_BASED_OUTPATIENT_CLINIC_OR_DEPARTMENT_OTHER): Payer: Self-pay

## 2022-12-16 MED ORDER — FLUCONAZOLE 200 MG PO TABS
200.0000 mg | ORAL_TABLET | Freq: Every day | ORAL | 0 refills | Status: DC
  Filled 2022-12-16: qty 10, 10d supply, fill #0

## 2022-12-17 ENCOUNTER — Other Ambulatory Visit (HOSPITAL_BASED_OUTPATIENT_CLINIC_OR_DEPARTMENT_OTHER): Payer: Self-pay

## 2022-12-17 ENCOUNTER — Other Ambulatory Visit: Payer: Self-pay

## 2022-12-17 MED ORDER — CLONAZEPAM 0.5 MG PO TABS
0.5000 mg | ORAL_TABLET | Freq: Two times a day (BID) | ORAL | 1 refills | Status: DC | PRN
  Filled 2022-12-17: qty 180, 90d supply, fill #0
  Filled 2023-02-10 – 2023-04-02 (×2): qty 180, 90d supply, fill #1

## 2022-12-23 ENCOUNTER — Other Ambulatory Visit (INDEPENDENT_AMBULATORY_CARE_PROVIDER_SITE_OTHER): Payer: Medicare Other

## 2022-12-23 DIAGNOSIS — M2550 Pain in unspecified joint: Secondary | ICD-10-CM | POA: Diagnosis not present

## 2022-12-23 LAB — TESTOSTERONE: Testosterone: 234.16 ng/dL — ABNORMAL LOW (ref 300.00–890.00)

## 2022-12-24 ENCOUNTER — Other Ambulatory Visit: Payer: Self-pay | Admitting: Family Medicine

## 2022-12-24 LAB — TESTOSTERONE TOTAL,FREE,BIO, MALES
Albumin: 4.3 g/dL (ref 3.6–5.1)
Sex Hormone Binding: 37 nmol/L (ref 22–77)
Testosterone: 129 ng/dL — ABNORMAL LOW (ref 250–827)

## 2022-12-24 LAB — DHEA-SULFATE: DHEA-SO4: 639 ug/dL — ABNORMAL HIGH (ref 3–225)

## 2022-12-24 LAB — SARS-COV-2 ANTIBODIES: SARS-CoV-2 Antibodies: NEGATIVE

## 2022-12-29 LAB — CUP PACEART REMOTE DEVICE CHECK
Date Time Interrogation Session: 20240923231202
Implantable Pulse Generator Implant Date: 20240305

## 2023-01-03 ENCOUNTER — Ambulatory Visit (INDEPENDENT_AMBULATORY_CARE_PROVIDER_SITE_OTHER): Payer: Medicare Other

## 2023-01-03 DIAGNOSIS — G459 Transient cerebral ischemic attack, unspecified: Secondary | ICD-10-CM | POA: Diagnosis not present

## 2023-01-05 ENCOUNTER — Encounter: Payer: Self-pay | Admitting: Family Medicine

## 2023-01-10 NOTE — Progress Notes (Unsigned)
[facility] [address] [place] [address] Phone: [phone] Subjective:   [name], am serving as a scribe for Dr. Antoine Primas.  I'm seeing this patient by the request  of:  Shelva Majestic, MD  CC: Back pain acute worsening  [id number]  12/01/2022 Recheck lab at the moment. Discussed need supplementation and potentially the new ones as well. Patient will consider this after the new year and after a year of the status post stroke. Patient was feeling much better when he was on that. Is going to see if the natural supplementations are still can be helpful. Follow-up with me again in 6 to 8 weeks otherwise.   Updated 01/11/2023 AADVIK ROKER is a 72 y.o. male coming in with complaint of polyarthralgia. Back pain got bad for a while. Started to get better. Usually gets flares after over doing activity. Flares cause spasms. Pain mostly on right side. This is the first time that he had to come to a halt because of the back pain.       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   Cataract    forming   Depressive disorder, not elsewhere classified    Diverticulitis    X 1 EPISODE   Esophageal reflux    past hx    HTN (hypertension)    Hyperlipidemia    CONTROLLED   Lymphocytic colitis    Neuromuscular disorder (HCC)    neuropathy   Neuropathy    Other testicular hypofunction    Palpitations    Personal history of urinary calculi    LAST IN THE 80'S   Past Surgical History:  Procedure Laterality Date   COLONOSCOPY     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   [name]   parotid tumor surgery     [place]- BENIGN   POLYPECTOMY     TONSILLECTOMY AND ADENOIDECTOMY     Social History   Socioeconomic  History   Marital status: Married    Spouse name: Not on file   Number of children: 0   Years of education: 18   Highest education level: Not on file  Occupational History   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 Married 30 + years. No children, lots of critters.       Cancer Survivor- carcinoid lung cancer.   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 Micah Flesher back to work for ability to be insured.       Hobbies: music, home repair/improvement, cats  Social Determinants of Health   Financial Resource Strain: Low Risk  (06/19/2021)   Overall Financial Resource Strain (CARDIA)    Difficulty of Paying Living Expenses: Not hard at all  Food Insecurity: No Food Insecurity (06/27/2022)   Hunger Vital Sign    Worried About Running Out of Food in the Last Year: Never true    Ran Out of Food in the Last Year: Never true  Transportation Needs: No Transportation Needs (06/27/2022)   PRAPARE - Administrator, Civil Service (Medical): No    Lack of Transportation (Non-Medical): No  Physical Activity: Sufficiently Active (06/27/2022)   Exercise Vital Sign    Days of Exercise per Week: 5 days    Minutes of Exercise per Session: 50 min  Stress: No Stress Concern Present (06/27/2022)   [employer] of Occupational Health - Occupational Stress Questionnaire    Feeling of Stress : Only a little  Social Connections: Unknown  (06/27/2022)   Social Connection and Isolation Panel [NHANES]    Frequency of Communication with Friends and Family: Patient declined    Frequency of Social Gatherings with Friends and Family: Patient declined    Attends Religious Services: Never    Database administrator or Organizations: No    Attends Engineer, structural: Never    Marital Status: Married   Allergies  Allergen Reactions   Bee Venom Anaphylaxis   Colestipol Diarrhea and Other (See Comments)   Epinephrine Other (See Comments)   Flagyl [Metronidazole Hcl] Other (See Comments)    Neuropathy    Ibuprofen Other (See Comments)   Nsaids     REACTION: intolereance- ABD PAIN AND DIARRHEA    Protonix [Pantoprazole]     Abdominal Pain and Diarrhea   Family History  Problem Relation Age of Onset   Hypertension Mother    Anxiety disorder Mother    Non-Hodgkin's lymphoma Mother    Dementia Father    Depression Father        and anxiety   Hypertension Father    Sudden death Paternal Uncle 61   Colon cancer Paternal Uncle    Hypertension Brother    Early death Neg Hx    Heart disease Neg Hx        mother in 8s had lesoin   Colon polyps Neg Hx    Esophageal cancer Neg Hx    Rectal cancer Neg Hx    Stomach cancer Neg Hx      Current Outpatient Medications (Cardiovascular):    CARTIA XT 180 MG 24 hr capsule, TAKE 1 CAPSULE BY MOUTH DAILY   hydrochlorothiazide (HYDRODIURIL) 25 MG tablet, Take 1 tablet (25 mg total) by mouth daily.   rosuvastatin (CRESTOR) 5 MG tablet, Take 1 tablet (5 mg total) by mouth daily.  Current Outpatient Medications (Respiratory):    beclomethasone (QVAR REDIHALER) 80 MCG/ACT inhaler, Inhale 1 puff into the lungs 2 (two) times daily.  Current Outpatient Medications (Analgesics):    aspirin EC 325 MG tablet, Take 325 mg by mouth daily.   HYDROcodone-acetaminophen (NORCO/VICODIN) 5-325 MG tablet, Take 0.5-1 tablets by mouth 2 (two) times daily as needed (No more than 4x a week use.  3 month supply).  Current Outpatient Medications (Hematological):    Cyanocobalamin (B-12) 2500 MCG TABS, Take by mouth.  Current Outpatient Medications (Other):    tiZANidine (ZANAFLEX) 4 MG tablet, Take 1 tablet (4 mg total) by mouth at bedtime.   Acetylcarnitine HCl (ACETYL L-CARNITINE PO), Take by mouth.  Ascorbic Acid (VITAMIN C) 1000 MG tablet,    buPROPion (WELLBUTRIN XL) 300 MG 24 hr tablet, TAKE 1 TABLET BY MOUTH DAILY   Cholecalciferol (VITAMIN D) 2000 UNITS CAPS, Take 1 capsule by mouth daily.   clonazePAM (KLONOPIN) 0.5 MG tablet, Take 1 tablet (0.5 mg total) by mouth 2 (two) times daily as needed for anxiety (do not drive for 12 hours after taking).   fluconazole (DIFLUCAN) 200 MG tablet, Take 1 tablet (200 mg total) by mouth daily.   gabapentin (NEURONTIN) 300 MG capsule, Take 3 capsules (900 mg total) by mouth at bedtime.   methylcellulose (CITRUCEL) oral powder, Use as directed once daily   potassium chloride (KLOR-CON) 10 MEQ tablet, TAKE 3 TABLETS BY MOUTH IN  THE MORNING AND 2 TABLETS  BY MOUTH IN THE AFTERNOON   Reviewed prior external information including notes and imaging from  primary care provider As well as notes that were available from care everywhere and other healthcare systems.  Past medical history, social, surgical and family history all reviewed in electronic medical record.  No pertanent information unless stated regarding to the chief complaint.   Review of Systems:  No headache, visual changes, nausea, vomiting, diarrhea, constipation, dizziness, abdominal pain, skin rash, fevers, chills, night sweats, weight loss, swollen lymph nodes, body aches, joint swelling, chest pain, shortness of breath, mood changes. POSITIVE muscle aches  Objective  Blood pressure 122/72, pulse 78, height 5\' 8"  (1.727 m), weight 178 lb (80.7 kg), SpO2 98%.   General: No apparent distress alert and oriented x3 mood and affect normal, dressed appropriately.  HEENT: Pupils  equal, extraocular movements intact  Respiratory: Patient's speak in full sentences and does not appear short of breath  Cardiovascular: No lower extremity edema, non tender, no erythema  Severe uncomfortable noted today.  Significant tightness noted in the paraspinal musculature of the lumbar spine.  Some midline tenderness noted as well.  Likely no significant radicular symptoms but patient does have significant voluntary guarding noted.  After verbal consent patient was prepped with alcohol swab and with a 25-gauge half inch needle injected in 4 distinct trigger points in the lumbar region.  Total of 3 cc of 0.5% Marcaine and 1 cc of Kenalog 40 mg/mL used.  No blood loss.  Band-Aid placed.  Postinjection instructions given    Impression and Recommendations:     The above documentation has been reviewed and is accurate and complete Judi Saa, DO

## 2023-01-11 ENCOUNTER — Telehealth: Payer: Self-pay | Admitting: Endocrinology

## 2023-01-11 ENCOUNTER — Ambulatory Visit: Payer: Medicare Other | Admitting: Family Medicine

## 2023-01-11 ENCOUNTER — Other Ambulatory Visit (HOSPITAL_BASED_OUTPATIENT_CLINIC_OR_DEPARTMENT_OTHER): Payer: Self-pay

## 2023-01-11 ENCOUNTER — Encounter: Payer: Self-pay | Admitting: Family Medicine

## 2023-01-11 VITALS — BP 122/72 | HR 78 | Ht 68.0 in | Wt 178.0 lb

## 2023-01-11 DIAGNOSIS — M549 Dorsalgia, unspecified: Secondary | ICD-10-CM | POA: Diagnosis not present

## 2023-01-11 DIAGNOSIS — M5459 Other low back pain: Secondary | ICD-10-CM | POA: Diagnosis not present

## 2023-01-11 DIAGNOSIS — G894 Chronic pain syndrome: Secondary | ICD-10-CM

## 2023-01-11 MED ORDER — TIZANIDINE HCL 4 MG PO TABS
4.0000 mg | ORAL_TABLET | Freq: Every day | ORAL | 0 refills | Status: DC
  Filled 2023-01-11: qty 30, 30d supply, fill #0

## 2023-01-11 NOTE — Assessment & Plan Note (Signed)
Patient has had a lumbar trigger points and given injections today.  Tolerated the procedure well.  Discussed icing regimen and home exercises.  I do feel advanced imaging is warranted secondary to the amount and severity of this.  We discussed which activities to do and which ones to avoid.  Increase activity slowly, discussed home exercises and icing regimen.  Will get advance imaging of the MRI secondary to the severity of patient having difficulty at this moment.  Patient has a past medical history also significant for cancer.  Do feel advanced imaging is warranted at this time

## 2023-01-11 NOTE — Telephone Encounter (Signed)
Patient called to schedule appointment for follow up.  He is a former Dr. Lucianne Muss patient.  Patient is scheduled for labs for 03/16/2023 and is requesting that testosterone level be tested also.  Patient is scheduled for 03/23/2023 for follow up with Dr. Erroll Luna.

## 2023-01-11 NOTE — Assessment & Plan Note (Signed)
Continues to have chronic pain that seems to be exacerbated.  He is on narcotics and has a controlled substance contract since 2018.  This is the patient's primary care provider.  Will see if there is any changes patient can have some extra while we do this.  Patient did not want a prescription from me because he did not want to violate the contract.

## 2023-01-11 NOTE — Patient Instructions (Addendum)
No more house work for the next year Trigger point injections MRI lumbar [place] Zanaflex 4mg  at night We'll talk after we receive the results

## 2023-01-12 ENCOUNTER — Other Ambulatory Visit: Payer: Self-pay | Admitting: Endocrinology

## 2023-01-12 DIAGNOSIS — E291 Testicular hypofunction: Secondary | ICD-10-CM

## 2023-01-12 DIAGNOSIS — R7989 Other specified abnormal findings of blood chemistry: Secondary | ICD-10-CM

## 2023-01-18 NOTE — Progress Notes (Signed)
[facility] Summary Report / Loop Recorder 

## 2023-01-23 ENCOUNTER — Encounter: Payer: Self-pay | Admitting: Family Medicine

## 2023-01-24 ENCOUNTER — Other Ambulatory Visit: Payer: Self-pay

## 2023-01-24 MED ORDER — BUPROPION HCL ER (XL) 300 MG PO TB24: 300.0000 mg | ORAL_TABLET | Freq: Every day | ORAL | 3 refills | Status: DC

## 2023-01-25 ENCOUNTER — Other Ambulatory Visit: Payer: Self-pay | Admitting: Family

## 2023-01-25 ENCOUNTER — Other Ambulatory Visit (HOSPITAL_BASED_OUTPATIENT_CLINIC_OR_DEPARTMENT_OTHER): Payer: Self-pay

## 2023-01-25 ENCOUNTER — Other Ambulatory Visit: Payer: Self-pay

## 2023-01-25 MED ORDER — HYDROCODONE-ACETAMINOPHEN 5-325 MG PO TABS
0.5000 | ORAL_TABLET | Freq: Two times a day (BID) | ORAL | 0 refills | Status: DC | PRN
  Filled 2023-01-25: qty 55, 28d supply, fill #0

## 2023-02-01 LAB — CUP PACEART REMOTE DEVICE CHECK
Date Time Interrogation Session: 20241026230852
Implantable Pulse Generator Implant Date: 20240305

## 2023-02-02 ENCOUNTER — Ambulatory Visit: Payer: Medicare Other | Admitting: Family Medicine

## 2023-02-03 ENCOUNTER — Encounter: Payer: Self-pay | Admitting: Cardiology

## 2023-02-06 ENCOUNTER — Ambulatory Visit: Payer: Medicare Other

## 2023-02-06 DIAGNOSIS — M47816 Spondylosis without myelopathy or radiculopathy, lumbar region: Secondary | ICD-10-CM | POA: Diagnosis not present

## 2023-02-06 DIAGNOSIS — M48061 Spinal stenosis, lumbar region without neurogenic claudication: Secondary | ICD-10-CM

## 2023-02-06 DIAGNOSIS — M5136 Other intervertebral disc degeneration, lumbar region with discogenic back pain only: Secondary | ICD-10-CM

## 2023-02-06 DIAGNOSIS — M549 Dorsalgia, unspecified: Secondary | ICD-10-CM

## 2023-02-06 DIAGNOSIS — M5126 Other intervertebral disc displacement, lumbar region: Secondary | ICD-10-CM | POA: Diagnosis not present

## 2023-02-07 ENCOUNTER — Ambulatory Visit (INDEPENDENT_AMBULATORY_CARE_PROVIDER_SITE_OTHER): Payer: Medicare Other

## 2023-02-07 DIAGNOSIS — G459 Transient cerebral ischemic attack, unspecified: Secondary | ICD-10-CM

## 2023-02-10 ENCOUNTER — Other Ambulatory Visit: Payer: Self-pay

## 2023-02-10 ENCOUNTER — Encounter: Payer: Self-pay | Admitting: Neurology

## 2023-02-10 ENCOUNTER — Other Ambulatory Visit (HOSPITAL_BASED_OUTPATIENT_CLINIC_OR_DEPARTMENT_OTHER): Payer: Self-pay

## 2023-02-15 ENCOUNTER — Other Ambulatory Visit (HOSPITAL_BASED_OUTPATIENT_CLINIC_OR_DEPARTMENT_OTHER): Payer: Self-pay

## 2023-02-15 MED ORDER — INFLUENZA VAC A&B SURF ANT ADJ 0.5 ML IM SUSY
0.5000 mL | PREFILLED_SYRINGE | Freq: Once | INTRAMUSCULAR | 0 refills | Status: AC
  Filled 2023-02-15: qty 0.5, 1d supply, fill #0

## 2023-02-23 DIAGNOSIS — H524 Presbyopia: Secondary | ICD-10-CM | POA: Diagnosis not present

## 2023-02-23 DIAGNOSIS — H43813 Vitreous degeneration, bilateral: Secondary | ICD-10-CM | POA: Diagnosis not present

## 2023-02-23 DIAGNOSIS — H2513 Age-related nuclear cataract, bilateral: Secondary | ICD-10-CM | POA: Diagnosis not present

## 2023-02-23 DIAGNOSIS — H25013 Cortical age-related cataract, bilateral: Secondary | ICD-10-CM | POA: Diagnosis not present

## 2023-02-23 DIAGNOSIS — H34232 Retinal artery branch occlusion, left eye: Secondary | ICD-10-CM | POA: Diagnosis not present

## 2023-02-23 DIAGNOSIS — H25043 Posterior subcapsular polar age-related cataract, bilateral: Secondary | ICD-10-CM | POA: Diagnosis not present

## 2023-02-23 DIAGNOSIS — H52203 Unspecified astigmatism, bilateral: Secondary | ICD-10-CM | POA: Diagnosis not present

## 2023-02-24 DIAGNOSIS — D229 Melanocytic nevi, unspecified: Secondary | ICD-10-CM | POA: Diagnosis not present

## 2023-02-24 DIAGNOSIS — D2262 Melanocytic nevi of left upper limb, including shoulder: Secondary | ICD-10-CM | POA: Diagnosis not present

## 2023-02-24 DIAGNOSIS — L817 Pigmented purpuric dermatosis: Secondary | ICD-10-CM | POA: Diagnosis not present

## 2023-02-24 DIAGNOSIS — L814 Other melanin hyperpigmentation: Secondary | ICD-10-CM | POA: Diagnosis not present

## 2023-02-24 DIAGNOSIS — D1801 Hemangioma of skin and subcutaneous tissue: Secondary | ICD-10-CM | POA: Diagnosis not present

## 2023-02-24 DIAGNOSIS — L219 Seborrheic dermatitis, unspecified: Secondary | ICD-10-CM | POA: Diagnosis not present

## 2023-02-24 DIAGNOSIS — D045 Carcinoma in situ of skin of trunk: Secondary | ICD-10-CM | POA: Diagnosis not present

## 2023-02-24 DIAGNOSIS — L738 Other specified follicular disorders: Secondary | ICD-10-CM | POA: Diagnosis not present

## 2023-02-24 DIAGNOSIS — L570 Actinic keratosis: Secondary | ICD-10-CM | POA: Diagnosis not present

## 2023-02-24 DIAGNOSIS — L821 Other seborrheic keratosis: Secondary | ICD-10-CM | POA: Diagnosis not present

## 2023-02-24 DIAGNOSIS — D0421 Carcinoma in situ of skin of right ear and external auricular canal: Secondary | ICD-10-CM | POA: Diagnosis not present

## 2023-02-24 DIAGNOSIS — L578 Other skin changes due to chronic exposure to nonionizing radiation: Secondary | ICD-10-CM | POA: Diagnosis not present

## 2023-02-24 DIAGNOSIS — D485 Neoplasm of uncertain behavior of skin: Secondary | ICD-10-CM | POA: Diagnosis not present

## 2023-02-28 ENCOUNTER — Ambulatory Visit (INDEPENDENT_AMBULATORY_CARE_PROVIDER_SITE_OTHER): Payer: Medicare Other | Admitting: Family Medicine

## 2023-02-28 ENCOUNTER — Encounter: Payer: Self-pay | Admitting: Family Medicine

## 2023-02-28 VITALS — BP 130/64 | HR 73 | Temp 97.8°F | Ht 68.0 in | Wt 181.0 lb

## 2023-02-28 DIAGNOSIS — I10 Essential (primary) hypertension: Secondary | ICD-10-CM | POA: Diagnosis not present

## 2023-02-28 DIAGNOSIS — F411 Generalized anxiety disorder: Secondary | ICD-10-CM

## 2023-02-28 DIAGNOSIS — G894 Chronic pain syndrome: Secondary | ICD-10-CM

## 2023-02-28 DIAGNOSIS — G609 Hereditary and idiopathic neuropathy, unspecified: Secondary | ICD-10-CM

## 2023-02-28 DIAGNOSIS — E781 Pure hyperglyceridemia: Secondary | ICD-10-CM | POA: Diagnosis not present

## 2023-02-28 DIAGNOSIS — R0789 Other chest pain: Secondary | ICD-10-CM | POA: Diagnosis not present

## 2023-02-28 DIAGNOSIS — I700 Atherosclerosis of aorta: Secondary | ICD-10-CM

## 2023-02-28 LAB — CBC WITH DIFFERENTIAL/PLATELET
Basophils Absolute: 0.1 10*3/uL (ref 0.0–0.1)
Basophils Relative: 0.7 % (ref 0.0–3.0)
Eosinophils Absolute: 0.2 10*3/uL (ref 0.0–0.7)
Eosinophils Relative: 2.2 % (ref 0.0–5.0)
HCT: 45.7 % (ref 39.0–52.0)
Hemoglobin: 16 g/dL (ref 13.0–17.0)
Lymphocytes Relative: 21.9 % (ref 12.0–46.0)
Lymphs Abs: 1.6 10*3/uL (ref 0.7–4.0)
MCHC: 35 g/dL (ref 30.0–36.0)
MCV: 96.1 fL (ref 78.0–100.0)
Monocytes Absolute: 0.9 10*3/uL (ref 0.1–1.0)
Monocytes Relative: 11.9 % (ref 3.0–12.0)
Neutro Abs: 4.7 10*3/uL (ref 1.4–7.7)
Neutrophils Relative %: 63.3 % (ref 43.0–77.0)
Platelets: 241 10*3/uL (ref 150.0–400.0)
RBC: 4.76 Mil/uL (ref 4.22–5.81)
RDW: 12.4 % (ref 11.5–15.5)
WBC: 7.5 10*3/uL (ref 4.0–10.5)

## 2023-02-28 LAB — LIPID PANEL
Cholesterol: 138 mg/dL (ref 0–200)
HDL: 62.4 mg/dL (ref >39.00)
LDL Cholesterol: 62 mg/dL (ref 0–99)
NonHDL: 76.09
Total CHOL/HDL Ratio: 2
Triglycerides: 72 mg/dL (ref 0.0–149.0)
VLDL: 14.4 mg/dL (ref 0.0–40.0)

## 2023-02-28 LAB — COMPREHENSIVE METABOLIC PANEL
ALT: 23 U/L (ref 0–53)
AST: 21 U/L (ref 0–37)
Albumin: 4.5 g/dL (ref 3.5–5.2)
Alkaline Phosphatase: 81 U/L (ref 39–117)
BUN: 13 mg/dL (ref 6–23)
CO2: 30 meq/L (ref 19–32)
Calcium: 9.7 mg/dL (ref 8.4–10.5)
Chloride: 102 meq/L (ref 96–112)
Creatinine, Ser: 0.94 mg/dL (ref 0.40–1.50)
GFR: 80.83 mL/min (ref >60.00)
Glucose, Bld: 110 mg/dL — ABNORMAL HIGH (ref 70–99)
Potassium: 3.6 meq/L (ref 3.5–5.1)
Sodium: 140 meq/L (ref 135–145)
Total Bilirubin: 0.8 mg/dL (ref 0.2–1.2)
Total Protein: 7 g/dL (ref 6.0–8.3)

## 2023-02-28 LAB — LIPASE: Lipase: 12 U/L (ref 11.0–59.0)

## 2023-02-28 LAB — AMYLASE: Amylase: 24 U/L — ABNORMAL LOW (ref 27–131)

## 2023-02-28 NOTE — Patient Instructions (Addendum)
Please stop by lab before you go If you have mychart- we will send your results within 3 business days of [place] receiving them.  If you do not have mychart- we will call you about results within 5 business days of [place] receiving them.  *please also note that you will see labs on mychart as soon as they post. I will later go in and write notes on them- will say "notes from Dr. Durene Cal"   We discussed cardiology visit if new or worsening or persistent symptoms- or immediately if significant worsening of symptoms  Recommended follow up: Return in about 4 months (around 06/28/2023) for physical or sooner if needed.Schedule b4 you leave.

## 2023-02-28 NOTE — Progress Notes (Signed)
Phone [phone] In person visit   Subjective:   [name] Page is a 72 y.o. year old very pleasant male patient who presents for/with See problem oriented charting Chief Complaint  Patient presents with   Medical Management of Chronic Issues   Hypertension   chest fullness    Pt c/o chest fullness/strange sensation in his chest x 3 weeks, denies new onset of SOB.   Past Medical History-  Patient Active Problem List   Diagnosis Date Noted   Chronic pain syndrome 08/31/2016    Priority: High   History of lung cancer 12/11/2012    Priority: High   Vitamin D deficiency 02/11/2022    Priority: Medium    Aortic atherosclerosis (HCC) 11/03/2020    Priority: Medium    History of adenomatous polyp of colon 03/24/2018    Priority: Medium    Generalized anxiety disorder 03/12/2014    Priority: Medium    Essential hypertension 03/12/2014    Priority: Medium    Peripheral neuropathy, idiopathic 12/22/2012    Priority: Medium    Dyspnea 07/14/2012    Priority: Medium    Hypertriglyceridemia 07/14/2012    Priority: Medium    Depression 02/16/2010    Priority: Medium    Low testosterone 11/21/2007    Priority: Medium    Chronic bilateral low back pain 06/05/2019    Priority: Low   Venous stasis of both lower extremities 06/05/2019    Priority: Low   Mastoiditis 03/09/2017    Priority: Low   Palpitations 08/11/2016    Priority: Low   Eustachian tube dysfunction 12/17/2014    Priority: Low   SI (sacroiliac) joint dysfunction 09/19/2013    Priority: Low   Nonspecific abnormal electrocardiogram (ECG) (EKG) 07/14/2012    Priority: Low   Allergic reaction, history of 10/25/2011    Priority: Low   Rectal or anal pain 10/01/2010    Priority: Low   Allergic rhinitis 06/13/2007    Priority: Low   GERD 06/13/2007    Priority: Low   IBS (irritable bowel syndrome) 06/13/2007    Priority: Low   Lumbar trigger point syndrome 01/11/2023   Recurrent major depressive disorder,  in partial remission (HCC) 06/16/2022   Nonallopathic lesion of sacral region 09/19/2013   Nonallopathic lesion of thoracic region 09/19/2013   Nonallopathic lesion of lumbosacral region 09/19/2013    Medications- reviewed and updated Current Outpatient Medications  Medication Sig Dispense Refill   Acetylcarnitine HCl (ACETYL L-CARNITINE PO) Take by mouth.     Ascorbic Acid (VITAMIN C) 1000 MG tablet      aspirin EC 325 MG tablet Take 325 mg by mouth daily.     buPROPion (WELLBUTRIN XL) 300 MG 24 hr tablet Take 1 tablet (300 mg total) by mouth daily. 90 tablet 3   CARTIA XT 180 MG 24 hr capsule TAKE 1 CAPSULE BY MOUTH DAILY 100 capsule 2   Cholecalciferol (VITAMIN D) 2000 UNITS CAPS Take 1 capsule by mouth daily.     Cyanocobalamin (B-12) 2500 MCG TABS Take by mouth.     gabapentin (NEURONTIN) 300 MG capsule Take 3 capsules (900 mg total) by mouth at bedtime. 300 capsule 3   hydrochlorothiazide (HYDRODIURIL) 25 MG tablet Take 1 tablet (25 mg total) by mouth daily. 90 tablet 3   methylcellulose (CITRUCEL) oral powder Use as directed once daily     potassium chloride (KLOR-CON) 10 MEQ tablet TAKE 3 TABLETS BY MOUTH IN  THE MORNING AND 2 TABLETS  BY MOUTH IN THE  AFTERNOON 500 tablet 2   rosuvastatin (CRESTOR) 5 MG tablet Take 1 tablet (5 mg total) by mouth daily. 90 tablet 3   beclomethasone (QVAR REDIHALER) 80 MCG/ACT inhaler Inhale 1 puff into the lungs 2 (two) times daily. 10.6 g 1   clonazePAM (KLONOPIN) 0.5 MG tablet Take 1 tablet (0.5 mg total) by mouth 2 (two) times daily as needed for anxiety (do not drive for 12 hours after taking). (Patient not taking: Reported on 02/28/2023) 180 tablet 1   fluconazole (DIFLUCAN) 200 MG tablet Take 1 tablet (200 mg total) by mouth daily. 10 tablet 0   HYDROcodone-acetaminophen (NORCO/VICODIN) 5-325 MG tablet Take 0.5-1 tablets by mouth 2 (two) times daily as needed (No more than 4x a week use. 3 month supply). (Patient not taking: Reported on  02/28/2023) 55 tablet 0   tiZANidine (ZANAFLEX) 4 MG tablet Take 1 tablet (4 mg total) by mouth at bedtime. 30 tablet 0   No current facility-administered medications for this visit.     Objective:  BP 130/64   Pulse 73   Temp 97.8 F (36.6 C)   Ht 5\' 8"  (1.727 m)   Wt 181 lb (82.1 kg)   SpO2 96%   BMI 27.52 kg/m  Gen: NAD, resting comfortably CV: RRR no murmurs rubs or gallops No chest wall tenderness Lungs: CTAB no crackles, wheeze, rhonchi Abdomen: soft/nontender/nondistended/normal bowel sounds. No rebound or guarding.  Ext: no edema Skin: warm, dry   EKG: SInus rhythm  (1st degree block) with rate 67, normal axis, normal intervals, no hypertrophy, no st or t wave changes - rsr in v1 noted    Assessment and Plan    # Chest fullness S: Reports chest fullness/strange sensation in his chest for 3-4 weeks- more of substernal burning- occasionally radiates down into the ribcage on both sides- comes on at rest.  Denies new shortness of breath. Some palpitations at baseline- slightly worse but do not go along with episodes typically.  -working out on treadmill and no issues. Only came on with lifting a battery out of his car once.  - usually around 10 minutes (no episodes in a week but maybe 2-3 per week), drinking water seems to help. No relation to meals.  -reassuring CT calcium 07/08/20. Reassuring echocardiogram 05/05/22 with Dr. Anne Fu.  A/P: hed prefer to check amylase and lipase to make sure pancreas ok. Hed like to hold off on cardiology right now with reassuring EKG but knows if worsening or fails to improve to reach out  -also wonders about esophogeal spasm- we can refer to gastroenterology if needed  # dermatology visit last week- one squamous but waiting on results to see if needs further excision.   #History of lung cancer/actually carcinoid tumor malignant-we follow an annual chest x-ray on him.  Last chest x-ray 09/28/22.  Also prior with CT chest abdomen pelvis  03/05/2020 - offered repeat today but wants to hold off  #Chronic pain syndrome/neuropathy S: Medication: Gabapentin 600- 900 mg at bedtime as occasionally affects balance higher dose, sparing hydrocodone 3-4x a week- mainly needing 4x a week now and always using full tablet- sometimes 1.5 needed or even 2 so running just under 4 months.   He knows to space hydrocodone and clonazepam at least 12 hours- mainly taking in evenings  -has had steroid injections with Dr. Katrinka Blazing that helped for some spasms in the back. Felt too sedated on muscle relaxant.   A/P: chronic pain unfortunately is worsening over time vs building tolerance  to medicine- with tolerance and increasing need we discussed possible referral to pain management- wants to hold off for now but will reach out if changes his mind on this. Continue current medications for now  . I hesitate to increase dose under my care especially with high risk combo of clonazepam and gabapentin already -PDMP reviewed and low risk other than [place] filling on my behalf while I was out  -UDS last updated 08/26/21- update 10/27/22 -Controlled substance contract on file from 07/08/2017 -still should avoid hydrocodone within 12 hours of clonazepam for anxiety  #hypertension/hypokalemia requiring potassium 20 mEq twice daily S: medication: hctz 25 mg, diltiazem 180 mg extended release (amlodipine caused edema in past).   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 Rosanne Gutting with left visual field disturbance in July 2024)-plans on loop recorder with Dr. Lalla Brothers # Hyperlipidemia/hypertriglyceridemia. LPA not elevated S: Medication:rosuvastatin 5 mg (4-5 days a week as worries about neuropathy), asa 325 mg Lab Results  Component Value Date    CHOL 132 02/11/2022   HDL 50.90 02/11/2022   LDLCALC 58 02/11/2022   LDLDIRECT 57.0 10/27/2022   TRIG 116.0 02/11/2022   CHOLHDL 3 02/11/2022   A/P: lipids at goal previously but taking less often- update today -prefer LDL under 70 with aortic atherosclerosis and history of brao   #Hypogonadism-on testosterone treatment in the past- stopped after BRAO in January 2024 but is now on clomid through Dr. Lucianne Muss- will be seeing new physician soon- holding off on treatment  Recommended follow up: Return in about 4 months (around 06/28/2023) for physical or sooner if needed.Schedule b4 you leave. Future Appointments  Date Time Provider Department Center  03/14/2023  7:20 AM [facility] DEVICE REMOTES [facility]  03/16/2023 10:15 AM [facility]/NEURO LAB [facility] None  03/23/2023 10:20 AM [place], [place], MD [facility] None  04/18/2023  7:20 AM [facility] DEVICE REMOTES [facility]  05/23/2023  7:20 AM [facility] DEVICE REMOTES [facility]  07/04/2023 10:00 AM [facility] ANNUAL WELLNESS VISIT 1 [facility]    Lab/Order associations:   ICD-10-CM   1. Chest fullness  R07.89 EKG 12-Lead    Comprehensive metabolic panel    CBC with Differential/Platelet    Amylase    Lipase    2. Essential hypertension  I10     3. Hypertriglyceridemia  E78.1 Lipid panel    4. Chronic pain syndrome  G89.4     5. Aortic atherosclerosis (HCC)  I70.0     6. Peripheral neuropathy, idiopathic  G60.9     7. Generalized anxiety disorder  F41.1       No orders of the defined types were placed in this encounter.   Return precautions advised.  Tana Conch, MD

## 2023-03-04 ENCOUNTER — Ambulatory Visit: Payer: Medicare Other

## 2023-03-04 DIAGNOSIS — G459 Transient cerebral ischemic attack, unspecified: Secondary | ICD-10-CM

## 2023-03-04 LAB — CUP PACEART REMOTE DEVICE CHECK
Date Time Interrogation Session: 20241128230929
Implantable Pulse Generator Implant Date: 20240305

## 2023-03-07 DIAGNOSIS — G459 Transient cerebral ischemic attack, unspecified: Secondary | ICD-10-CM | POA: Diagnosis not present

## 2023-03-07 NOTE — Progress Notes (Signed)
[facility] Summary Report / Loop Recorder 

## 2023-03-14 ENCOUNTER — Other Ambulatory Visit: Payer: Self-pay

## 2023-03-14 DIAGNOSIS — E291 Testicular hypofunction: Secondary | ICD-10-CM

## 2023-03-14 DIAGNOSIS — R7989 Other specified abnormal findings of blood chemistry: Secondary | ICD-10-CM

## 2023-03-14 DIAGNOSIS — R7301 Impaired fasting glucose: Secondary | ICD-10-CM

## 2023-03-16 ENCOUNTER — Other Ambulatory Visit: Payer: Medicare Other

## 2023-03-16 ENCOUNTER — Other Ambulatory Visit: Payer: Self-pay

## 2023-03-16 ENCOUNTER — Other Ambulatory Visit: Payer: Self-pay | Admitting: Endocrinology

## 2023-03-16 DIAGNOSIS — E291 Testicular hypofunction: Secondary | ICD-10-CM

## 2023-03-16 DIAGNOSIS — R7989 Other specified abnormal findings of blood chemistry: Secondary | ICD-10-CM | POA: Diagnosis not present

## 2023-03-16 DIAGNOSIS — R7301 Impaired fasting glucose: Secondary | ICD-10-CM | POA: Diagnosis not present

## 2023-03-17 ENCOUNTER — Encounter: Payer: Self-pay | Admitting: Family Medicine

## 2023-03-21 LAB — TESTOSTERONE TOTAL,FREE,BIO, MALES
Albumin: 4.2 g/dL (ref 3.6–5.1)
Sex Hormone Binding: 44 nmol/L (ref 22–77)
Testosterone: 133 ng/dL — ABNORMAL LOW (ref 250–827)

## 2023-03-21 LAB — GLUCOSE, RANDOM: Glucose, Plasma: 86 mg/dL (ref 65–139)

## 2023-03-21 LAB — CORTISOL: Cortisol, Plasma: 13.3 ug/dL

## 2023-03-21 LAB — LUTEINIZING HORMONE: LH: 13.1 m[IU]/mL (ref 1.6–15.2)

## 2023-03-21 LAB — FOLLICLE STIMULATING HORMONE: FSH: 22.5 m[IU]/mL — ABNORMAL HIGH (ref 1.4–12.8)

## 2023-03-21 LAB — PROLACTIN: Prolactin: 9.2 ng/mL (ref 2.0–18.0)

## 2023-03-21 LAB — ACTH: C206 ACTH: 18 pg/mL (ref 6–50)

## 2023-03-21 LAB — DHEA-SULFATE: DHEA-SO4: 795 ug/dL — ABNORMAL HIGH (ref 3–225)

## 2023-03-21 LAB — 17-HYDROXYPROGESTERONE: 17-OH-Progesterone, LC/MS/MS: 54 ng/dL

## 2023-03-22 DIAGNOSIS — L570 Actinic keratosis: Secondary | ICD-10-CM | POA: Diagnosis not present

## 2023-03-22 DIAGNOSIS — D045 Carcinoma in situ of skin of trunk: Secondary | ICD-10-CM | POA: Diagnosis not present

## 2023-03-23 ENCOUNTER — Encounter: Payer: Self-pay | Admitting: Endocrinology

## 2023-03-23 ENCOUNTER — Ambulatory Visit: Payer: Medicare Other | Admitting: Endocrinology

## 2023-03-23 VITALS — BP 142/80 | HR 75 | Resp 20 | Ht 68.0 in | Wt 180.2 lb

## 2023-03-23 DIAGNOSIS — G459 Transient cerebral ischemic attack, unspecified: Secondary | ICD-10-CM | POA: Diagnosis not present

## 2023-03-23 DIAGNOSIS — R7989 Other specified abnormal findings of blood chemistry: Secondary | ICD-10-CM

## 2023-03-23 DIAGNOSIS — I749 Embolism and thrombosis of unspecified artery: Secondary | ICD-10-CM | POA: Diagnosis not present

## 2023-03-23 DIAGNOSIS — E291 Testicular hypofunction: Secondary | ICD-10-CM | POA: Diagnosis not present

## 2023-03-23 NOTE — Progress Notes (Unsigned)
Outpatient Endocrinology Note [place] Johathan Province, MD   Patient's Name: [name] Page    DOB: 1950-12-18    MRN: [id number]  REASON OF VISIT: Follow up for hypogonadism / low testosterone.   PCP:  Shelva Majestic, MD  HISTORY OF PRESENT ILLNESS:   [name] Page is a 72 y.o. old male with past medical history listed below, is here for follow up for hypogonadism/low testosterone.  Pertinent Hx: Patient was previously seen by Dr. Lucianne Muss and was last time seen in November 05, 2022.  Patient was diagnosed with hypogonadism around 2012.  Symptoms at the time of diagnosis were 40, increased depression, decreased libido.  No history of hot flashes, increased sweating, breast enlargement, long-term use of anabolic steroid, testicular injury, head injury or mumps in the childhood.  No history of osteopenia or low impact bone fracture. At that time an afternoon testosterone level was 214 and free testosterone level low at 32, normal >47. He was also evaluated by a urologist and details were not available.  He was treated with testosterone gel but he thinks this did not improve his testosterone levels and likely did not continue treatment. Subsequently he was again evaluated in 2015 and with a low testosterone of 239 he was started on testosterone injections. With this he thinks she had some improvement in his fatigue and depression His dose had been adjusted from his initial starting dose of 100 mg every 2 weeks and he has been on various regimens including every 3 or 4 weeks injections. His testosterone level had been quite variable since about 2016.  He had a history of erythrocytosis due to testosterone treatment in the past in 2018, 2019, 2020 timeframe.   Most recently , he was taking 30 mg of testosterone weekly,was stopped in January 2024.  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  He was evaluated by ophthalmology as well.   He was evaluated by neurology and cardiology including evaluation for coagulopathy, unrevealing for the cause of embolism.  He was on testosterone injection therapy at that time and was stopped by neurology.  This event was 3 weeks after COVID shot.  Around timeframe of end of 2020 he had testosterone in the mid normal range 575.  No erythrocytosis at that time with hematocrit 41.4 and hemoglobin 16.8.  Patient was advised for clomiphene due to continued fatigue and low testosterone level in June 2024 however patient hesitant to start due to potential risks of thromboembolism.  He also checked with neurology.   Interval history Patient has complaints of significant fatigue, weakness.  He also complains of muscle mass decreasing.  He does regular exercise.  Patient was hesitant to restart clomiphene.  He is concerned about thromboembolic side effect of testosterone therapy, in the context of having possibly embolic TIA to cause retinal branch artery occlusion.  He has question about if he has Klinefelter syndrome.  He has never fathered a child.  Based on clinical review and laboratory results pattern of testosterone level and gonadotropins, he does not think atrophy of the testicles as well, no physical exam performed, unlikely to be Klinefelter syndrome however karyotype testing can only confirm that.    Patient is currently not on anticoagulation, he is taking aspirin 325 mg daily.  Recent lab results reviewed total testosterone low 133, LH upper normal, FSH elevated.  Sex hormone binding globulin normal.  17-hydroxyprogesterone normal.  ACTH normal cortisol level normal.  DHEA-S  elevated however he was taking DHEA at that time.   Latest Reference Range & Units 03/16/23 10:36  C206 ACTH 6 - 50 pg/mL 18  DHEA-SO4 3 - 225 mcg/dL 176 (H)  Cortisol, Plasma mcg/dL 16.0  LH 1.6 - 73.7 mIU/mL 13.1  FSH 1.4 - 12.8 mIU/mL 22.5 (H)  Prolactin 2.0 - 18.0 ng/mL 9.2  Glucose, Plasma 65 - 139 mg/dL 86   Sex Horm Binding Glob, Serum 22 - 77 nmol/L 44  Testosterone 250 - 827 ng/dL 106 (L)  Testosterone, Bioavailable 15.0 - 150.0 ng/dL Pend  Testosterone Free 6.0 - 73.0 pg/mL See below  17-OH-Progesterone, LC/MS/MS  ng/dL 54  (H): Data is abnormally high (L): Data is abnormally low  Patient is accompanied by his wife in the clinic today.  REVIEW OF SYSTEMS:  As per history of present illness.   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   Cataract    forming   Depressive disorder, not elsewhere classified    Diverticulitis    X 1 EPISODE   Esophageal reflux    past hx    HTN (hypertension)    Hyperlipidemia    CONTROLLED   Lymphocytic colitis    Neuromuscular disorder (HCC)    neuropathy   Neuropathy    Other testicular hypofunction    Palpitations    Personal history of urinary calculi    LAST IN THE 80'S    PAST SURGICAL HISTORY: Past Surgical History:  Procedure Laterality Date   COLONOSCOPY     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   [name]   parotid tumor surgery     [place]- BENIGN   POLYPECTOMY     TONSILLECTOMY AND ADENOIDECTOMY      ALLERGIES: Allergies  Allergen Reactions   Bee Venom Anaphylaxis   Colestipol Diarrhea and Other (See Comments)   Epinephrine Other (See Comments)   Flagyl [Metronidazole Hcl] Other (See Comments)    Neuropathy    Ibuprofen Other (See Comments)   Nsaids     REACTION: intolereance- ABD PAIN AND DIARRHEA    Protonix [Pantoprazole]     Abdominal Pain and Diarrhea    FAMILY HISTORY:  Family History  Problem Relation Age of Onset   Hypertension Mother    Anxiety disorder Mother    Non-Hodgkin's lymphoma Mother    Dementia Father    Depression Father        and anxiety    Hypertension Father    Sudden death Paternal Uncle 54   Colon cancer Paternal Uncle    Hypertension Brother    Early death Neg Hx    Heart disease Neg Hx        mother in 31s had lesoin   Colon polyps Neg Hx    Esophageal cancer Neg Hx    Rectal cancer Neg Hx    Stomach cancer Neg Hx     SOCIAL HISTORY: Social History   Socioeconomic History   Marital status: Married    Spouse name: Not on file   Number of children: 0   Years of education: 18   Highest education level: Bachelor's degree (e.g., BA, AB, BS)  Occupational History   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 Married 30 + years. No children, lots of critters.       Cancer Survivor- carcinoid lung cancer.   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 Micah Flesher back to work for ability to be insured.       Hobbies: music, home repair/improvement, cats   Social Drivers of Health   Financial Resource Strain: Low Risk  (02/27/2023)   Overall Financial Resource Strain (CARDIA)    Difficulty of Paying Living Expenses: Not hard at all  Food Insecurity: No Food Insecurity (02/27/2023)   Hunger Vital Sign    Worried About Running Out of Food in the Last Year: Never true    Ran Out of Food in the Last Year: Never true  Transportation Needs: No Transportation Needs (02/27/2023)   PRAPARE - Administrator, Civil Service (Medical): No    Lack of Transportation  (Non-Medical): No  Physical Activity: Sufficiently Active (02/27/2023)   Exercise Vital Sign    Days of Exercise per Week: 5 days    Minutes of Exercise per Session: 50 min  Stress: No Stress Concern Present (02/27/2023)   [employer] of Occupational Health - Occupational Stress Questionnaire    Feeling of Stress : Only a little  Social Connections: Socially Isolated (02/27/2023)   Social Connection and Isolation Panel [NHANES]    Frequency of Communication with Friends and Family: Never    Frequency of Social Gatherings with Friends and Family: Never    Attends Religious Services: Never    Database administrator or Organizations: No    Attends Engineer, structural: Never    Marital Status: Married    MEDICATIONS:  Current Outpatient Medications  Medication Sig Dispense Refill   Acetylcarnitine HCl (ACETYL L-CARNITINE PO) Take by mouth.     Ascorbic Acid (VITAMIN C) 1000 MG tablet      aspirin EC 325 MG tablet Take 325 mg by mouth daily.     buPROPion (WELLBUTRIN XL) 300 MG 24 hr tablet Take 1 tablet (300 mg total) by mouth daily. 90 tablet 3   CARTIA XT 180 MG 24 hr capsule TAKE 1 CAPSULE BY MOUTH DAILY 100 capsule 2   Cholecalciferol (VITAMIN D) 2000 UNITS CAPS Take 1 capsule by mouth daily.     clonazePAM (KLONOPIN) 0.5 MG tablet Take 1 tablet (0.5 mg total) by mouth 2 (two) times daily as needed for anxiety (do not drive for 12 hours after taking). 180 tablet 1   Cyanocobalamin (B-12) 2500 MCG TABS Take by mouth.     gabapentin (NEURONTIN) 300 MG capsule Take 3 capsules (900 mg total) by mouth at bedtime. 300 capsule 3   hydrochlorothiazide (HYDRODIURIL) 25 MG tablet Take 1 tablet (25 mg total) by mouth daily. 90 tablet 3   HYDROcodone-acetaminophen (NORCO/VICODIN) 5-325 MG tablet Take 0.5-1 tablets by  mouth 2 (two) times daily as needed (No more than 4x a week use. 3 month supply). 55 tablet 0   methylcellulose (CITRUCEL) oral powder Use as directed once daily      potassium chloride (KLOR-CON) 10 MEQ tablet TAKE 3 TABLETS BY MOUTH IN  THE MORNING AND 2 TABLETS  BY MOUTH IN THE AFTERNOON 500 tablet 2   rosuvastatin (CRESTOR) 5 MG tablet Take 1 tablet (5 mg total) by mouth daily. 90 tablet 3   No current facility-administered medications for this visit.    PHYSICAL EXAM: Vitals:   03/23/23 1033 03/23/23 1034  BP: (!) 142/90 (!) 142/80  Pulse: 75   Resp: 20   SpO2: 97%   Weight: 180 lb 3.2 oz (81.7 kg)   Height: 5\' 8"  (1.727 m)    Body mass index is 27.4 kg/m.  Wt Readings from Last 3 Encounters:  03/23/23 180 lb 3.2 oz (81.7 kg)  02/28/23 181 lb (82.1 kg)  01/11/23 178 lb (80.7 kg)    General: Well developed, well nourished male in no apparent distress. Non-Cushingoid. No obvious Klinefelter's syndrome body habitus HEENT: AT/[place], no external lesions. Hearing intact to the spoken word Eyes: EOMI. No proptosis, stare and lid lag. Conjunctiva clear and no icterus.  Neck: Trachea midline, neck supple without appreciable thyromegaly or lymphadenopathy and no palpable thyroid nodules Lungs: Clear to auscultation, no wheeze. Respirations not labored Heart: S1S2, Regular in rate and rhythm. Abdomen: Soft, non tender, non distended Neurologic: Alert, oriented, normal speech, deep tendon biceps reflexes normal,  no gross focal neurological deficit. No obvious proximal weakness Extremities: No pedal pitting edema, no tremors of outstretched hands, no excessive hair growth Skin: Warm, color good. No gynecomastia.  Psychiatric: Does not appear depressed or anxious.   PERTINENT HISTORIC LABORATORY AND IMAGING STUDIES:  All pertinent laboratory results were reviewed. Please see HPI also for further details.   ASSESSMENT / PLAN  1. Hypogonadism in male   2. 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 Elevated DHEA    -Patient has history of hypogonadism at least from 2012, used to be on testosterone therapy however was discontinued after left retinal branch artery  occlusion/TIA possibly embolic in January 2024. -Patient has significantly low testosterone level with symptoms of fatigue, low motivation/depression. -Patient was advised for clomiphene in June 2024 however he is hesitant to take due to risks of possible thromboembolism. -When patient had possible embolic event in January 2024 he had mid normal level of testosterone with normal hematocrit and hemoglobin.  Etiology of embolism is unclear however he was on testosterone therapy injectable and also had COVID shot about 3 weeks prior to the event. -Patient has concern of thromboembolic side effects of testosterone therapy; at the same time he would consider to be on testosterone treatment especially in the context of having severe fatigue and depression/low motivation. -Discussed that this is something to weigh between risk and benefit.  Since the other etiology of embolism was not found, this cannot completely rule out the possibility of testosterone therapy causing embolism at that time.  He is currently not on anticoagulation.  He has been taking aspirin 325 mg daily.  - If we plan for testosterone therapy I would prefer topical or nasal like natesto, not  injectable testosterone.  Discussed possible benefit of testosterone therapy for bone health, energy level, cardiovascular health.  Plan: -Refer to hematology to evaluate possible need of anticoagulation in the context of history of embolic TIA and possible plan for  testosterone therapy. -No plan for testosterone therapy at this time. -Will rediscuss after he is evaluated by hematology in the follow-up visit. -I would like to check total, bioavailable, free, LH and FSH labs prior to follow-up visit.  # Elevated DHEA-S is due to intake of supplementation of DHEA.   Diagnoses and all orders for this visit:  Hypogonadism in male -     Follicle stimulating hormone -     Luteinizing hormone -     Testosterone Total,Free,Bio, Males -     Ambulatory  referral to Hematology / Oncology  TIA due to embolism [facility]) -     Ambulatory referral to Hematology / Oncology  Elevated DHEA    I spent 45 minutes in this visit, with face to face encounter, medical records review, imagings review, documentation and discussion with patient about of findings, diagnosis, medications, management plan as above. All questions were answered.   DISPOSITION Follow up in clinic in 2 months suggested.  All questions answered and patient verbalized understanding of the plan.  [place] Erich Kochan, MD [facility] Endocrinology [facility] [address] [place], [address] [place], [place] [address] Phone # [phone]  At least part of this note was generated using voice recognition software. Inadvertent word errors may have occurred, which were not recognized during the proofreading process.

## 2023-03-24 NOTE — Progress Notes (Signed)
[facility] Summary Report / Loop Recorder 

## 2023-04-02 ENCOUNTER — Other Ambulatory Visit (HOSPITAL_BASED_OUTPATIENT_CLINIC_OR_DEPARTMENT_OTHER): Payer: Self-pay

## 2023-04-02 ENCOUNTER — Other Ambulatory Visit: Payer: Self-pay | Admitting: Family Medicine

## 2023-04-04 ENCOUNTER — Other Ambulatory Visit: Payer: Self-pay

## 2023-04-04 ENCOUNTER — Other Ambulatory Visit (HOSPITAL_BASED_OUTPATIENT_CLINIC_OR_DEPARTMENT_OTHER): Payer: Self-pay

## 2023-04-04 ENCOUNTER — Other Ambulatory Visit: Payer: Self-pay | Admitting: Family Medicine

## 2023-04-04 DIAGNOSIS — H903 Sensorineural hearing loss, bilateral: Secondary | ICD-10-CM | POA: Diagnosis not present

## 2023-04-05 ENCOUNTER — Encounter: Payer: Self-pay | Admitting: Family Medicine

## 2023-04-05 ENCOUNTER — Other Ambulatory Visit (HOSPITAL_BASED_OUTPATIENT_CLINIC_OR_DEPARTMENT_OTHER): Payer: Self-pay

## 2023-04-05 MED ORDER — HYDROCHLOROTHIAZIDE 25 MG PO TABS
25.0000 mg | ORAL_TABLET | Freq: Every day | ORAL | 3 refills | Status: DC
  Filled 2023-04-05: qty 90, 90d supply, fill #0
  Filled 2023-06-26: qty 90, 90d supply, fill #1
  Filled 2023-09-27: qty 90, 90d supply, fill #2
  Filled 2023-12-26: qty 90, 90d supply, fill #3

## 2023-04-07 ENCOUNTER — Ambulatory Visit (INDEPENDENT_AMBULATORY_CARE_PROVIDER_SITE_OTHER): Payer: Medicare Other

## 2023-04-07 DIAGNOSIS — G459 Transient cerebral ischemic attack, unspecified: Secondary | ICD-10-CM

## 2023-04-07 LAB — CUP PACEART REMOTE DEVICE CHECK
Date Time Interrogation Session: 20250101231252
Implantable Pulse Generator Implant Date: 20240305

## 2023-04-21 ENCOUNTER — Other Ambulatory Visit: Payer: Self-pay

## 2023-04-21 ENCOUNTER — Encounter: Payer: Self-pay | Admitting: Family Medicine

## 2023-04-21 MED ORDER — HYDROCODONE-ACETAMINOPHEN 5-325 MG PO TABS
0.5000 | ORAL_TABLET | Freq: Two times a day (BID) | ORAL | 0 refills | Status: DC | PRN
  Filled 2023-04-21: qty 55, 28d supply, fill #0

## 2023-04-22 ENCOUNTER — Other Ambulatory Visit (HOSPITAL_BASED_OUTPATIENT_CLINIC_OR_DEPARTMENT_OTHER): Payer: Self-pay

## 2023-04-22 ENCOUNTER — Other Ambulatory Visit: Payer: Self-pay

## 2023-05-02 ENCOUNTER — Encounter: Payer: Self-pay | Admitting: Oncology

## 2023-05-02 ENCOUNTER — Inpatient Hospital Stay: Payer: Medicare Other

## 2023-05-02 ENCOUNTER — Inpatient Hospital Stay: Payer: Medicare Other | Attending: Oncology | Admitting: Oncology

## 2023-05-02 VITALS — BP 140/69 | HR 66 | Temp 98.0°F | Resp 18 | Ht 68.0 in | Wt 185.3 lb

## 2023-05-02 DIAGNOSIS — G8929 Other chronic pain: Secondary | ICD-10-CM | POA: Diagnosis not present

## 2023-05-02 DIAGNOSIS — Z85118 Personal history of other malignant neoplasm of bronchus and lung: Secondary | ICD-10-CM | POA: Insufficient documentation

## 2023-05-02 DIAGNOSIS — R5383 Other fatigue: Secondary | ICD-10-CM | POA: Diagnosis not present

## 2023-05-02 DIAGNOSIS — G609 Hereditary and idiopathic neuropathy, unspecified: Secondary | ICD-10-CM | POA: Insufficient documentation

## 2023-05-02 DIAGNOSIS — Z800 Family history of malignant neoplasm of digestive organs: Secondary | ICD-10-CM | POA: Insufficient documentation

## 2023-05-02 DIAGNOSIS — Z7982 Long term (current) use of aspirin: Secondary | ICD-10-CM | POA: Insufficient documentation

## 2023-05-02 DIAGNOSIS — E291 Testicular hypofunction: Secondary | ICD-10-CM | POA: Diagnosis not present

## 2023-05-02 DIAGNOSIS — R002 Palpitations: Secondary | ICD-10-CM | POA: Diagnosis not present

## 2023-05-02 DIAGNOSIS — G459 Transient cerebral ischemic attack, unspecified: Secondary | ICD-10-CM

## 2023-05-02 DIAGNOSIS — K589 Irritable bowel syndrome without diarrhea: Secondary | ICD-10-CM | POA: Diagnosis not present

## 2023-05-02 DIAGNOSIS — Z9089 Acquired absence of other organs: Secondary | ICD-10-CM | POA: Insufficient documentation

## 2023-05-02 DIAGNOSIS — D6859 Other primary thrombophilia: Secondary | ICD-10-CM | POA: Insufficient documentation

## 2023-05-02 DIAGNOSIS — R531 Weakness: Secondary | ICD-10-CM | POA: Diagnosis not present

## 2023-05-02 DIAGNOSIS — I749 Embolism and thrombosis of unspecified artery: Secondary | ICD-10-CM

## 2023-05-02 DIAGNOSIS — I10 Essential (primary) hypertension: Secondary | ICD-10-CM | POA: Diagnosis not present

## 2023-05-02 DIAGNOSIS — M6281 Muscle weakness (generalized): Secondary | ICD-10-CM | POA: Diagnosis not present

## 2023-05-02 DIAGNOSIS — Z886 Allergy status to analgesic agent status: Secondary | ICD-10-CM | POA: Insufficient documentation

## 2023-05-02 DIAGNOSIS — Z7722 Contact with and (suspected) exposure to environmental tobacco smoke (acute) (chronic): Secondary | ICD-10-CM | POA: Insufficient documentation

## 2023-05-02 DIAGNOSIS — F411 Generalized anxiety disorder: Secondary | ICD-10-CM | POA: Diagnosis not present

## 2023-05-02 DIAGNOSIS — Z8673 Personal history of transient ischemic attack (TIA), and cerebral infarction without residual deficits: Secondary | ICD-10-CM | POA: Diagnosis not present

## 2023-05-02 DIAGNOSIS — Z79899 Other long term (current) drug therapy: Secondary | ICD-10-CM | POA: Diagnosis not present

## 2023-05-02 DIAGNOSIS — Z8249 Family history of ischemic heart disease and other diseases of the circulatory system: Secondary | ICD-10-CM | POA: Insufficient documentation

## 2023-05-02 DIAGNOSIS — R7989 Other specified abnormal findings of blood chemistry: Secondary | ICD-10-CM

## 2023-05-02 DIAGNOSIS — H7010 Chronic mastoiditis, unspecified ear: Secondary | ICD-10-CM | POA: Diagnosis not present

## 2023-05-02 DIAGNOSIS — F32A Depression, unspecified: Secondary | ICD-10-CM | POA: Insufficient documentation

## 2023-05-02 DIAGNOSIS — I700 Atherosclerosis of aorta: Secondary | ICD-10-CM | POA: Insufficient documentation

## 2023-05-02 DIAGNOSIS — Z881 Allergy status to other antibiotic agents status: Secondary | ICD-10-CM | POA: Diagnosis not present

## 2023-05-02 DIAGNOSIS — K219 Gastro-esophageal reflux disease without esophagitis: Secondary | ICD-10-CM | POA: Diagnosis not present

## 2023-05-02 DIAGNOSIS — Z91030 Bee allergy status: Secondary | ICD-10-CM | POA: Insufficient documentation

## 2023-05-02 DIAGNOSIS — M1990 Unspecified osteoarthritis, unspecified site: Secondary | ICD-10-CM | POA: Diagnosis not present

## 2023-05-02 LAB — COMPREHENSIVE METABOLIC PANEL
ALT: 17 U/L (ref 0–44)
AST: 19 U/L (ref 15–41)
Albumin: 4.3 g/dL (ref 3.5–5.0)
Alkaline Phosphatase: 68 U/L (ref 38–126)
Anion gap: 6 (ref 5–15)
BUN: 14 mg/dL (ref 8–23)
CO2: 30 mmol/L (ref 22–32)
Calcium: 9.2 mg/dL (ref 8.9–10.3)
Chloride: 103 mmol/L (ref 98–111)
Creatinine, Ser: 0.98 mg/dL (ref 0.61–1.24)
GFR, Estimated: >60 mL/min (ref >60)
Glucose, Bld: 100 mg/dL — ABNORMAL HIGH (ref 70–99)
Potassium: 4.4 mmol/L (ref 3.5–5.1)
Sodium: 139 mmol/L (ref 135–145)
Total Bilirubin: 1.1 mg/dL (ref 0.0–1.2)
Total Protein: 6.4 g/dL — ABNORMAL LOW (ref 6.5–8.1)

## 2023-05-02 LAB — CBC WITH DIFFERENTIAL/PLATELET
Abs Immature Granulocytes: 0.02 10*3/uL (ref 0.00–0.07)
Basophils Absolute: 0 10*3/uL (ref 0.0–0.1)
Basophils Relative: 0 %
Eosinophils Absolute: 0.2 10*3/uL (ref 0.0–0.5)
Eosinophils Relative: 2 %
HCT: 43.3 % (ref 39.0–52.0)
Hemoglobin: 15.3 g/dL (ref 13.0–17.0)
Immature Granulocytes: 0 %
Lymphocytes Relative: 21 %
Lymphs Abs: 1.6 10*3/uL (ref 0.7–4.0)
MCH: 32.3 pg (ref 26.0–34.0)
MCHC: 35.3 g/dL (ref 30.0–36.0)
MCV: 91.5 fL (ref 80.0–100.0)
Monocytes Absolute: 0.7 10*3/uL (ref 0.1–1.0)
Monocytes Relative: 10 %
Neutro Abs: 4.9 10*3/uL (ref 1.7–7.7)
Neutrophils Relative %: 67 %
Platelets: 219 10*3/uL (ref 150–400)
RBC: 4.73 MIL/uL (ref 4.22–5.81)
RDW: 11.2 % — ABNORMAL LOW (ref 11.5–15.5)
WBC: 7.4 10*3/uL (ref 4.0–10.5)
nRBC: 0 % (ref 0.0–0.2)

## 2023-05-02 LAB — D-DIMER, QUANTITATIVE: D-Dimer, Quant: 0.57 ug{FEU}/mL — ABNORMAL HIGH (ref 0.00–0.50)

## 2023-05-02 LAB — FIBRINOGEN: Fibrinogen: 448 mg/dL (ref 210–475)

## 2023-05-02 LAB — LACTATE DEHYDROGENASE: LDH: 169 U/L (ref 98–192)

## 2023-05-02 NOTE — Assessment & Plan Note (Signed)
Testosterone therapy discontinued in January 2024 due to stroke. Patient reports muscle weakness and fatigue since discontinuation. Discussed risks and benefits of resuming therapy, including need for regular hematocrit monitoring to prevent increased clot risk. Explained that testosterone can increase hematocrit levels, which if above 55, can increase clot and stroke risk. Discussed monthly hematocrit checks initially, then every three months if stable. Mentioned phlebotomy or blood donation to manage elevated hematocrit levels. Noted recent data suggesting no increased stroke risk if hematocrit is within normal range. - Resume testosterone therapy if desired - Monitor hematocrit monthly initially, then every three months if stable - Consider phlebotomy or blood donation if hematocrit exceeds 55

## 2023-05-02 NOTE — Progress Notes (Signed)
[facility]  HEMATOLOGY CLINIC CONSULTATION NOTE   PATIENT NAME: [name] Page   MR#: [id number] DOB: [birth year]  DATE OF SERVICE: 05/02/2023   REFERRING PHYSICIAN  [place] Thapa, MD, endocrinology  Patient Care Team: Shelva Majestic, MD as PCP - General (Family Medicine) Lanier Prude, MD as PCP - Electrophysiology (Cardiology) Reather Littler, MD (Inactive) as Consulting Physician (Endocrinology) Thapa, [place], MD as Consulting Physician (Endocrinology)   REASON FOR CONSULTATION/ CHIEF COMPLAINT:  History of possible embolic TIA in January 2024  ASSESSMENT & PLAN:  [name] Page is a 73 y.o. gentleman with a past medical history of essential hypertension, venous stasis of both lower extremities, aortic atherosclerosis, GERD, IBS, idiopathic peripheral neuropathy, chronic mastoiditis, hypertriglyceridemia, history of lung cancer remote September 2015 Dr. 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    TIA due to embolism [facility]) Please review HPI for additional details and timeline of events.  Extensive workup by neurology and cardiology showed no etiology for the TIA event or evidence of hypercoagulable state.  He had extensive hypercoagulable workup which was all unremarkable with repeat lupus anticoagulant being normal.  Anticardiolipin antibodies were not checked and we will check this today.  Regardless of the workup, TIAs are related to arterial thrombosis/emboli.  Thrombophilias are known to be associated with venous thrombosis/emboli.  Clinical picture is not consistent with DIC or PNH or other hematological etiologies that can cause arterial thromboemboli.  No routine indication for anticoagulation in his situation.  Aspirin is deemed sufficient for  antiplatelet therapy.   Discussed risks of anticoagulation given neuropathy and falls.   Mentioned potential link between COVID-19 boosters and clotting, noting case reports but emphasizing the need for more data.  CBCD, CMP, LDH, fibrinogen are all unremarkable today.  D-dimer is 0.57, close to normal.  He was advised to continue aspirin and follow-up with his neurologist and cardiologist.  Reassurance provided.   Low testosterone Testosterone therapy discontinued in January 2024 due to stroke. Patient reports muscle weakness and fatigue since discontinuation. Discussed risks and benefits of resuming therapy, including need for regular hematocrit monitoring to prevent increased clot risk. Explained that testosterone can increase hematocrit levels, which if above 55, can increase clot and stroke risk. Discussed monthly hematocrit checks initially, then every three months if stable. Mentioned phlebotomy or blood donation to manage elevated hematocrit levels. Noted recent data suggesting no increased stroke risk if hematocrit is within normal range. - Resume testosterone therapy if desired - Monitor hematocrit monthly initially, then every three months if stable - Consider phlebotomy or blood donation if hematocrit exceeds 55  We will communicate above recommendations to Dr. Erroll Luna.  Remaining workup results will be communicated to the patient via MyChart.  No additional hematological workup or intervention is needed at this time.  Thank you for giving [name] Page an opportunity to participate in his care.  Please reconsult [name] Page as necessary.  I reviewed lab results and outside records for this visit and discussed relevant results with the patient. Diagnosis, plan of care and treatment options were also discussed in detail with the patient. Opportunity provided to ask questions and answers provided to his apparent satisfaction. Provided instructions to call our clinic with any problems, questions or concerns. I  recommended to continue follow-up with PCP and sub-specialists. He verbalized understanding and agreed  with the plan. No barriers to learning was detected.  Meryl Crutch, MD  05/02/2023 2:56 PM  [facility] - [facility] [address] [place] [address] Dept: [phone] Dept Fax: [phone]   HISTORY OF PRESENT ILLNESS:  Discussed the use of AI scribe software for clinical note transcription with the patient, who gave verbal consent to proceed.   Patient presented to the ED on 04/14/2022 with complaints of acute onset left monocular blurred and obscured vision.  He experienced floaters at some point as well.  Vision loss was painless and only affected the lower portions of the visual field of his left eye.  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 He did take 4 baby ASA as a precautionary measure. After several hours the vision in his left eye returned to normal. He was sent to [facility] from [facility] for MRI brain. Ultrasound of the globes performed here by EDP reveal findings suspicious for vitreous hemorrhage and/or retinal/vitreous detachment.  Patient was scheduled with ophthalmology outpatient and cardiology outpatient. Echocardiogram, carotid Dopplers were obtained by cardiology.  He later had implantable loop recorder as well as to look for arrhythmias.  Dr. Sherryll Burger with ophthalmology evaluated the patient around this time and he was diagnosed with a branch retinal occlusion, presumably embolic. There was no evidence of vitreous hemorrhage or retinal detachment on ophthalmology evaluation.  He has had extensive workup in neurology office previously including hypercoagulable workup on 04/19/2022.  Prothrombin gene mutation, factor V Leiden mutation,  beta-2 glycoprotein antibodies were negative.  Protein S activity, Antithrombin III activity, protein C activity, homocystine levels were all within normal limits.  Lupus anticoagulant initially was positive in January 2024, repeat negative in March 2024.  Multiple myeloma panel was negative.  Rheumatoid factor, ANCA panel, ANA were all negative.  He was previously on testosterone injections for his hypogonadism.  He had mild polycythemia in 2019 and occasionally in 2022. Testosterone injections have been on hold since the TIA diagnosis.  The TIA occurred three weeks after receiving a COVID-19 booster shot.  Since stopping the testosterone, the patient reports feeling weak and fatigued, with difficulty gaining muscle strength despite regular exercise. The patient also reports occasional difficulty swallowing, even when not eating. The patient has not had any recurrence of stroke symptoms, and has not noticed any lymph nodes. The patient has a loop recorder in place since March 2024 for monitoring cardiac activity, and reports occasional palpitations but no significant findings. The patient has not received any subsequent COVID-19 booster shots since the stroke.  He denies fever, cough, diarrhea, or other infectious symptoms. He also denies unintentional weight loss, night sweats or other constitutional symptoms.  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   Cataract    forming   Depressive disorder, not elsewhere classified    Diverticulitis    X 1 EPISODE   Esophageal reflux    past hx    HTN (hypertension)    Hyperlipidemia  CONTROLLED   Lymphocytic colitis    Neuromuscular disorder (HCC)    neuropathy   Neuropathy    Other testicular hypofunction    Palpitations    Personal history  of urinary calculi    LAST IN THE 80'S     SURGICAL HISTORY Past Surgical History:  Procedure Laterality Date   COLONOSCOPY     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 He has never used smokeless tobacco. He reports current alcohol use. He reports that he does not use drugs. 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 Married 30 + years. No children, lots of critters.       Cancer Survivor- carcinoid lung cancer.   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 Micah Flesher back to work for ability to be insured.       Hobbies: music, home repair/improvement, cats   Social Drivers of Health   Financial Resource Strain: Low Risk  (02/27/2023)   Overall Financial Resource Strain (CARDIA)    Difficulty of Paying Living Expenses: Not hard at all  Food Insecurity: No Food Insecurity (05/02/2023)   Hunger Vital Sign    Worried About Running Out of Food in the Last Year: Never true    Ran Out of Food in the Last  Year: Never true  Transportation Needs: No Transportation Needs (05/02/2023)   PRAPARE - Administrator, Civil Service (Medical): No    Lack of Transportation (Non-Medical): No  Physical Activity: Sufficiently Active (02/27/2023)   Exercise Vital Sign    Days of Exercise per Week: 5 days    Minutes of Exercise per Session: 50 min  Stress: No Stress Concern Present (02/27/2023)   [employer] of Occupational Health - Occupational Stress Questionnaire    Feeling of Stress : Only a little  Social Connections: Socially Isolated (02/27/2023)   Social Connection and Isolation Panel [NHANES]    Frequency of Communication with Friends and Family: Never    Frequency of Social Gatherings with Friends and Family: Never    Attends Religious Services: Never    Database administrator or Organizations: No    Attends Banker Meetings: Never    Marital Status: Married  Catering manager Violence: Not At Risk (05/02/2023)   Humiliation, Afraid, Rape, and Kick questionnaire    Fear  of Current or Ex-Partner: No    Emotionally Abused: No    Physically Abused: No    Sexually Abused: No    FAMILY HISTORY: His family history includes Anxiety disorder in his mother; Colon cancer in his paternal uncle; Dementia in his father; Depression in his father; Hypertension in his brother, father, and mother; Non-Hodgkin's lymphoma in his mother; Sudden death (age of onset: 57) in his paternal uncle.  CURRENT MEDICATIONS   Current Outpatient Medications  Medication Instructions   Acetylcarnitine HCl (ACETYL L-CARNITINE PO) Take by mouth.   Ascorbic Acid (VITAMIN C) 1000 MG tablet No dose, route, or frequency recorded.   aspirin EC 325 mg, Daily   buPROPion (WELLBUTRIN XL) 300 mg, Oral, Daily   Cartia XT 180 mg, Oral, Daily   Cholecalciferol (VITAMIN D) 2000 UNITS CAPS 1 capsule, Daily   clonazePAM (KLONOPIN) 0.5 MG tablet Take 1 tablet (0.5 mg total) by mouth 2 (two) times daily as  needed for anxiety (do not drive for 12 hours after taking).   Cyanocobalamin (B-12) 2500 MCG TABS Take by mouth.   gabapentin (NEURONTIN) 900 mg, Oral, Daily at bedtime   hydrochlorothiazide (HYDRODIURIL) 25 mg, Oral, Daily   HYDROcodone-acetaminophen (NORCO/VICODIN) 5-325 MG tablet 0.5-1 tablets, Oral, 2 times daily PRN   methylcellulose (CITRUCEL) oral powder Use as directed once daily   potassium chloride (KLOR-CON) 10 MEQ tablet TAKE 3 TABLETS BY MOUTH IN  THE MORNING AND 2 TABLETS  BY MOUTH IN THE AFTERNOON   rosuvastatin (CRESTOR) 5 mg, Oral, Daily     ALLERGIES  He is allergic to bee venom, colestipol, epinephrine, flagyl [metronidazole hcl], ibuprofen, nsaids, and protonix [pantoprazole].  REVIEW OF SYSTEMS:  Review of Systems - Oncology   Rest of the pertinent review of systems is unremarkable except as mentioned above in HPI.  PHYSICAL EXAMINATION:  ECOG PERFORMANCE STATUS: 1 - Symptomatic but completely ambulatory  Vitals:   05/02/23 1003  BP: (!) 140/69  Pulse: 66  Resp: 18  Temp: 98 F (36.7 C)  SpO2: 98%   Filed Weights   05/02/23 1003  Weight: 185 lb 4.8 oz (84.1 kg)    Physical Exam Constitutional:      General: He is not in acute distress.    Appearance: Normal appearance.  HENT:     Head: Normocephalic and atraumatic.  Eyes:     General: No scleral icterus.    Conjunctiva/sclera: Conjunctivae normal.  Cardiovascular:     Rate and Rhythm: Normal rate and regular rhythm.     Heart sounds: Normal heart sounds.  Pulmonary:     Effort: Pulmonary effort is normal.     Breath sounds: Normal breath sounds.  Abdominal:     General: There is no distension.     Palpations: Abdomen is soft. There is no mass.  Musculoskeletal:     Right lower leg: No edema.     Left lower leg: No edema.  Neurological:     General: No focal deficit present.     Mental Status: He is alert and oriented to person, place, and time.  Psychiatric:        Mood and Affect:  Mood normal.        Behavior: Behavior normal.        Thought Content: Thought content normal.     LABORATORY DATA:   I have reviewed the data as listed.  Results for orders placed or performed in visit on 05/02/23  Fibrinogen  Result Value Ref Range  Fibrinogen 448 210 - 475 mg/dL  D-dimer, quantitative  Result Value Ref Range   D-Dimer, Quant 0.57 (H) 0.00 - 0.50 ug/mL-FEU  Lactate dehydrogenase  Result Value Ref Range   LDH 169 98 - 192 U/L  Comprehensive metabolic panel  Result Value Ref Range   Sodium 139 135 - 145 mmol/L   Potassium 4.4 3.5 - 5.1 mmol/L   Chloride 103 98 - 111 mmol/L   CO2 30 22 - 32 mmol/L   Glucose, Bld 100 (H) 70 - 99 mg/dL   BUN 14 8 - 23 mg/dL   Creatinine, Ser 9.52 0.61 - 1.24 mg/dL   Calcium 9.2 8.9 - 84.1 mg/dL   Total Protein 6.4 (L) 6.5 - 8.1 g/dL   Albumin 4.3 3.5 - 5.0 g/dL   AST 19 15 - 41 U/L   ALT 17 0 - 44 U/L   Alkaline Phosphatase 68 38 - 126 U/L   Total Bilirubin 1.1 0.0 - 1.2 mg/dL   GFR, Estimated >32 >44 mL/min   Anion gap 6 5 - 15  CBC with Differential/Platelet  Result Value Ref Range   WBC 7.4 4.0 - 10.5 K/uL   RBC 4.73 4.22 - 5.81 MIL/uL   Hemoglobin 15.3 13.0 - 17.0 g/dL   HCT 01.0 27.2 - 53.6 %   MCV 91.5 80.0 - 100.0 fL   MCH 32.3 26.0 - 34.0 pg   MCHC 35.3 30.0 - 36.0 g/dL   RDW 64.4 (L) 03.4 - 74.2 %   Platelets 219 150 - 400 K/uL   nRBC 0.0 0.0 - 0.2 %   Neutrophils Relative % 67 %   Neutro Abs 4.9 1.7 - 7.7 K/uL   Lymphocytes Relative 21 %   Lymphs Abs 1.6 0.7 - 4.0 K/uL   Monocytes Relative 10 %   Monocytes Absolute 0.7 0.1 - 1.0 K/uL   Eosinophils Relative 2 %   Eosinophils Absolute 0.2 0.0 - 0.5 K/uL   Basophils Relative 0 %   Basophils Absolute 0.0 0.0 - 0.1 K/uL   Immature Granulocytes 0 %   Abs Immature Granulocytes 0.02 0.00 - 0.07 K/uL     RADIOGRAPHIC STUDIES:   CUP PACEART REMOTE DEVICE CHECK Result Date: 04/07/2023 ILR summary report received. Battery status OK. Normal device  function. No new tachy, brady, or pause episodes. No new AF episodes. Monthly summary reports and ROV/PRN 1 symptom activation, Other  Not at all active  Palpitations while watching TV.  EGM c/w SR with frequent PVC's LA, CVRS   Orders Placed This Encounter  Procedures   CBC with Differential/Platelet    Standing Status:   Future    Number of Occurrences:   1    Expiration Date:   05/01/2024   Comprehensive metabolic panel    Standing Status:   Future    Number of Occurrences:   1    Expiration Date:   05/01/2024   Lactate dehydrogenase    Standing Status:   Future    Number of Occurrences:   1    Expiration Date:   05/01/2024   Cardiolipin antibodies, IgG, IgM, IgA    Standing Status:   Future    Number of Occurrences:   1    Expiration Date:   05/01/2024   D-dimer, quantitative    Standing Status:   Future    Number of Occurrences:   1    Expiration Date:   05/01/2024   Fibrinogen    Standing Status:   Future  Number of Occurrences:   1    Expiration Date:   05/01/2024    Future Appointments  Date Time Provider Department Center  05/09/2023  7:20 AM [facility] DEVICE REMOTES [facility]  05/20/2023 10:00 AM [facility] END[facility]/NEURO LAB [facility] None  05/24/2023 10:00 AM Thapa, [place], MD [facility] None  07/04/2023 10:00 AM [facility] ANNUAL WELLNESS VISIT 1 [facility]  09/06/2023 10:00 AM Shelva Majestic, MD [facility]    I spent a total of 55 minutes during this encounter with the patient including review of chart and various tests results, discussions about plan of care and coordination of care plan.  This document was completed utilizing speech recognition software. Grammatical errors, random word insertions, pronoun errors, and incomplete sentences are an occasional consequence of this system due to software limitations, ambient noise, and hardware issues. Any formal questions or concerns about the content, text or information contained within the body of this  dictation should be directly addressed to the provider for clarification.

## 2023-05-02 NOTE — Assessment & Plan Note (Addendum)
Please review HPI for additional details and timeline of events.  Extensive workup by neurology and cardiology showed no etiology for the TIA event or evidence of hypercoagulable state.  He had extensive hypercoagulable workup which was all unremarkable with repeat lupus anticoagulant being normal.  Anticardiolipin antibodies were not checked and we will check this today.  Regardless of the workup, TIAs are related to arterial thrombosis/emboli.  Thrombophilias are known to be associated with venous thrombosis/emboli.  Clinical picture is not consistent with DIC or PNH or other hematological etiologies that can cause arterial thromboemboli.  No routine indication for anticoagulation in his situation.  Aspirin is deemed sufficient for antiplatelet therapy.   Discussed risks of anticoagulation given neuropathy and falls.   Mentioned potential link between COVID-19 boosters and clotting, noting case reports but emphasizing the need for more data.  CBCD, CMP, LDH, fibrinogen are all unremarkable today.  D-dimer is 0.57, close to normal.  He was advised to continue aspirin and follow-up with his neurologist and cardiologist.  Reassurance provided.

## 2023-05-03 ENCOUNTER — Encounter: Payer: Self-pay | Admitting: Oncology

## 2023-05-03 LAB — CARDIOLIPIN ANTIBODIES, IGG, IGM, IGA
Anticardiolipin IgA: <9 [APL'U]/mL (ref 0–11)
Anticardiolipin IgG: <9 [GPL'U]/mL (ref 0–14)
Anticardiolipin IgM: 13 [MPL'U]/mL — ABNORMAL HIGH (ref 0–12)

## 2023-05-09 ENCOUNTER — Ambulatory Visit: Payer: Medicare Other

## 2023-05-09 DIAGNOSIS — G459 Transient cerebral ischemic attack, unspecified: Secondary | ICD-10-CM | POA: Diagnosis not present

## 2023-05-10 ENCOUNTER — Other Ambulatory Visit: Payer: Self-pay | Admitting: Family Medicine

## 2023-05-10 LAB — CUP PACEART REMOTE DEVICE CHECK
Date Time Interrogation Session: 20250202231942
Implantable Pulse Generator Implant Date: 20240305

## 2023-05-11 ENCOUNTER — Other Ambulatory Visit: Payer: Self-pay

## 2023-05-11 MED ORDER — ROSUVASTATIN CALCIUM 5 MG PO TABS
5.0000 mg | ORAL_TABLET | Freq: Every day | ORAL | 3 refills | Status: DC
  Filled 2023-05-11: qty 90, 90d supply, fill #0
  Filled 2023-08-05: qty 90, 90d supply, fill #1
  Filled 2023-10-31: qty 90, 90d supply, fill #2
  Filled 2024-01-30: qty 90, 90d supply, fill #3

## 2023-05-14 ENCOUNTER — Encounter: Payer: Self-pay | Admitting: Cardiology

## 2023-05-18 NOTE — Progress Notes (Signed)
[facility] Summary Report / Loop Recorder

## 2023-05-20 ENCOUNTER — Other Ambulatory Visit: Payer: Medicare Other

## 2023-05-20 LAB — TESTOSTERONE TOTAL,FREE,BIO, MALES
Albumin: 4.4 g/dL (ref 3.6–5.1)
Sex Hormone Binding: 44 nmol/L (ref 22–77)
Testosterone: 226 ng/dL — ABNORMAL LOW (ref 250–827)

## 2023-05-20 LAB — LUTEINIZING HORMONE: LH: 10.1 m[IU]/mL (ref 1.6–15.2)

## 2023-05-20 LAB — FOLLICLE STIMULATING HORMONE: FSH: 23 m[IU]/mL — ABNORMAL HIGH (ref 1.4–12.8)

## 2023-05-23 ENCOUNTER — Encounter: Payer: Self-pay | Admitting: Endocrinology

## 2023-05-24 ENCOUNTER — Ambulatory Visit: Payer: Medicare Other | Admitting: Endocrinology

## 2023-05-24 ENCOUNTER — Encounter: Payer: Self-pay | Admitting: Endocrinology

## 2023-05-24 VITALS — BP 138/80 | HR 61 | Resp 20 | Ht 68.0 in | Wt 185.0 lb

## 2023-05-24 DIAGNOSIS — R5382 Chronic fatigue, unspecified: Secondary | ICD-10-CM | POA: Diagnosis not present

## 2023-05-24 DIAGNOSIS — G459 Transient cerebral ischemic attack, unspecified: Secondary | ICD-10-CM | POA: Diagnosis not present

## 2023-05-24 DIAGNOSIS — R7989 Other specified abnormal findings of blood chemistry: Secondary | ICD-10-CM | POA: Diagnosis not present

## 2023-05-24 DIAGNOSIS — E291 Testicular hypofunction: Secondary | ICD-10-CM | POA: Diagnosis not present

## 2023-05-24 DIAGNOSIS — I749 Embolism and thrombosis of unspecified artery: Secondary | ICD-10-CM | POA: Diagnosis not present

## 2023-05-24 NOTE — Progress Notes (Signed)
 Outpatient Endocrinology Note [place] Amariya Liskey, MD   Patient's Name: [name] Page    DOB: [birth year]    MRN: [id number]  REASON OF VISIT: Follow up for hypogonadism / low testosterone.   PCP:  Shelva Majestic, MD  HISTORY OF PRESENT ILLNESS:   [name] Page is a 73 y.o. old male with past medical history listed below, is here for follow up for hypogonadism / low testosterone.  Pertinent Hx: Patient was previously seen by Dr. Lucianne Muss and was last time seen in November 05, 2022.  Patient was diagnosed with hypogonadism around 2012.  Symptoms at the time of diagnosis were increased depression, decreased libido.  No history of hot flashes, increased sweating, breast enlargement, long-term use of anabolic steroid, testicular injury, head injury or mumps in the childhood.  No history of osteopenia or low impact bone fracture. At that time an afternoon testosterone level was 214 and free testosterone level low at 32, normal >47. He was also evaluated by a urologist and details were not available.  He was treated with testosterone gel but he thinks this did not improve his testosterone levels and likely did not continue treatment. Subsequently he was again evaluated in 2015 and with a low testosterone of 239 he was started on testosterone injections. With this he thinks she had some improvement in his fatigue and depression His dose had been adjusted from his initial starting dose of 100 mg every 2 weeks and he has been on various regimens including every 3 or 4 weeks injections. His testosterone level had been quite variable since about 2016.  He had a history of erythrocytosis due to testosterone treatment in the past in 2018, 2019, 2020 timeframe.   He was taking 30 mg of testosterone weekly,was stopped in January 2024.  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  He was evaluated by ophthalmology as well.  He was evaluated  by neurology and cardiology including evaluation for coagulopathy, unrevealing for the cause of embolism.  He was on testosterone injection therapy at that time and was stopped by neurology.  This event was 3 weeks after COVID shot.  Around timeframe of end of 2023 he had testosterone in the mid normal range 575.  No erythrocytosis at that time with hematocrit 41.4 and hemoglobin 16.8.  Patient was advised for clomiphene due to continued fatigue and low testosterone level in June 2024 however patient hesitant to start due to potential risks of thromboembolism.  He also checked with neurology.  In May 02, 2023 patient was evaluated by hematology and oncology for possible embolic TIA, in the context of testosterone therapy, recommended no routine indication of anticoagulation in this context, mention potential risks with COVID-19 booster and clotting.  In regard to testosterone therapy, advised for close monitoring of hematocrit levels, when the therapy resumed if desired.   Interval history Patient was evaluated by hematology and oncology, appreciated in the end of January, reviewed office note and noted as above.  On the workup patient was found to have anticardiolipin IgM antibody, 13 with negative would be less than 13.  He had mildly elevated D-dimer.  There is recommendation to repeat cardiolipin test in 3 months.  Patient will talk further regarding D-dimer mild elevation with hematology.  Patient continues to have intermittent significant weakness and fatigue, myalgia.  Patient has mostly cold intolerance.  Denies change in bowel habit.  He has dry skin.  Patient also has  sleep disturbance occasionally snoring and not able to sleep well.  Patient had abnormal thyroid function test with mild elevated TSH last year in January however repeat within 2 weeks was normalized and of TSH.  He has never been on thyroid medication.  He had normal TSH in June 2024.  Recent laboratory result with low total  testosterone 226 in the late morning however has been improving.  Elevated FSH and normal LH.  Patient mentioned there is a recommendation to repeat cardiolipin antibody in 3 months.  Patient wants to hold off on starting testosterone therapy until the repeat test.   Latest Reference Range & Units 05/20/23 09:58  LH 1.6 - 15.2 mIU/mL 10.1  FSH 1.4 - 12.8 mIU/mL 23.0 (H)  Sex Horm Binding Glob, Serum 22 - 77 nmol/L 44  Testosterone 250 - 827 ng/dL 578 (L)  (H): Data is abnormally high (L): Data is abnormally low  Patient is accompanied by his wife in the clinic today.  REVIEW OF SYSTEMS:  As per history of present illness.   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   Cataract    forming   Depressive disorder, not elsewhere classified    Diverticulitis    X 1 EPISODE   Esophageal reflux    past hx    HTN (hypertension)    Hyperlipidemia    CONTROLLED   Lymphocytic colitis    Neuromuscular disorder (HCC)    neuropathy   Neuropathy    Other testicular hypofunction    Palpitations    Personal history of urinary calculi    LAST IN THE 80'S    PAST SURGICAL HISTORY: Past Surgical History:  Procedure Laterality Date   COLONOSCOPY     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 States not an allergy.    Flagyl [Metronidazole Hcl] Other (See Comments)    Neuropathy    Ibuprofen Other (See Comments)   Nsaids     REACTION: intolereance-  ABD PAIN AND DIARRHEA    Protonix [Pantoprazole]     Abdominal Pain and Diarrhea    FAMILY HISTORY:  Family History  Problem Relation Age of Onset   Hypertension Mother    Anxiety disorder Mother    Non-Hodgkin's lymphoma Mother    Dementia Father    Depression Father        and anxiety   Hypertension Father    Sudden death Paternal Uncle 46   Colon cancer Paternal Uncle    Hypertension Brother    Early death Neg Hx    Heart disease Neg Hx        mother in 70s had lesoin   Colon polyps Neg Hx    Esophageal cancer Neg Hx    Rectal cancer Neg Hx    Stomach cancer Neg Hx     SOCIAL HISTORY: Social  History   Socioeconomic History   Marital status: Married    Spouse name: Not on file   Number of children: 0   Years of education: 61   Highest education level: Bachelor's degree (e.g., BA, AB, BS)  Occupational History   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 Married 30 + years. No children, lots of critters.       Cancer Survivor- carcinoid lung cancer.   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 [name] Page back to work for ability to be insured.       Hobbies: music, home repair/improvement, cats   Social Drivers of Health   Financial Resource Strain: Low Risk  (02/27/2023)   Overall Financial Resource Strain (CARDIA)    Difficulty of Paying Living Expenses: Not hard at all  Food Insecurity: No Food Insecurity  (05/02/2023)   Hunger Vital Sign    Worried About Running Out of Food in the Last Year: Never true    Ran Out of Food in the Last Year: Never true  Transportation Needs: No Transportation Needs (05/02/2023)   PRAPARE - Administrator, Civil Service (Medical): No    Lack of Transportation (Non-Medical): No  Physical Activity: Sufficiently Active (02/27/2023)   Exercise Vital Sign    Days of Exercise per Week: 5 days    Minutes of Exercise per Session: 50 min  Stress: No Stress Concern Present (02/27/2023)   [employer] of Occupational Health - Occupational Stress Questionnaire    Feeling of Stress : Only a little  Social Connections: Socially Isolated (02/27/2023)   Social Connection and Isolation Panel [NHANES]    Frequency of Communication with Friends and Family: Never    Frequency of Social Gatherings with Friends and Family: Never    Attends Religious Services: Never    Database administrator or Organizations: No    Attends Engineer, structural: Never    Marital Status: Married    MEDICATIONS:  Current Outpatient Medications  Medication Sig Dispense Refill   Acetylcarnitine HCl (ACETYL L-CARNITINE PO) Take by mouth.     Ascorbic Acid (VITAMIN C) 1000 MG tablet      aspirin EC 325 MG tablet Take 325 mg by mouth daily.     buPROPion (WELLBUTRIN XL) 300 MG 24 hr tablet Take 1 tablet (300 mg total) by mouth daily. 90 tablet 3   CARTIA XT 180 MG 24 hr capsule TAKE 1 CAPSULE BY MOUTH DAILY 100 capsule 2   Cholecalciferol (VITAMIN D) 2000 UNITS CAPS Take 1 capsule by mouth daily.     clonazePAM (KLONOPIN) 0.5 MG tablet Take 1 tablet (0.5 mg total) by mouth 2 (two) times daily as needed for anxiety (do not drive for 12 hours after taking). 180 tablet 1   Cyanocobalamin (B-12) 2500 MCG TABS Take by mouth.  gabapentin (NEURONTIN) 300 MG capsule Take 3 capsules (900 mg total) by mouth at bedtime. 300 capsule 3   hydrochlorothiazide (HYDRODIURIL) 25 MG tablet  Take 1 tablet (25 mg total) by mouth daily. 90 tablet 3   HYDROcodone-acetaminophen (NORCO/VICODIN) 5-325 MG tablet Take 0.5-1 tablets by mouth 2 (two) times daily as needed (No more than 4x a week use. 3 month supply). 55 tablet 0   methylcellulose (CITRUCEL) oral powder Use as directed once daily     potassium chloride (KLOR-CON) 10 MEQ tablet TAKE 3 TABLETS BY MOUTH IN  THE MORNING AND 2 TABLETS  BY MOUTH IN THE AFTERNOON 500 tablet 2   rosuvastatin (CRESTOR) 5 MG tablet Take 1 tablet (5 mg total) by mouth daily. 90 tablet 3   No current facility-administered medications for this visit.    PHYSICAL EXAM: Vitals:   05/24/23 1006 05/24/23 1007  BP: (!) 148/80 138/80  Pulse: 61   Resp: 20   SpO2: 97%   Weight: 185 lb (83.9 kg)   Height: 5\' 8"  (1.727 m)    Body mass index is 28.13 kg/m.  Wt Readings from Last 3 Encounters:  05/24/23 185 lb (83.9 kg)  05/02/23 185 lb 4.8 oz (84.1 kg)  03/23/23 180 lb 3.2 oz (81.7 kg)    General: Well developed, well nourished male in no apparent distress.  HEENT: AT/[place], no external lesions. Hearing intact to the spoken word Eyes: EOMI. No proptosis, stare and lid lag. Conjunctiva clear and no icterus.  Neck: Trachea midline, neck supple without appreciable thyromegaly or lymphadenopathy and no palpable thyroid nodules Lungs: Clear to auscultation, no wheeze. Respirations not labored Heart: S1S2, Regular in rate and rhythm. Abdomen: Soft, non tender, non distended Neurologic: Alert, oriented, normal speech, deep tendon biceps reflexes normal,  no gross focal neurological deficit. No obvious proximal weakness Extremities: No pedal pitting edema, no tremors of outstretched hands, no excessive hair growth Skin: Warm, color good. No gynecomastia.  Dry skin. Psychiatric: Does not appear depressed or anxious.   PERTINENT HISTORIC LABORATORY AND IMAGING STUDIES:  All pertinent laboratory results were reviewed. Please see HPI also for further details.    ASSESSMENT / PLAN  1. Hypogonadism in male   2. Abnormal thyroid blood test   3. Chronic fatigue   4. TIA due to embolism [facility])     -Patient has history of hypogonadism at least from 2012, used to be on testosterone therapy however was discontinued after left retinal branch artery occlusion/TIA possibly embolic in January 2024. -Patient has significantly low testosterone level with symptoms of fatigue, low motivation/depression. -Patient was advised for clomiphene in June 2024 however he is hesitant to take due to risks of possible thromboembolism. -When patient had possible embolic event in January 2024 he had mid normal level of testosterone with normal hematocrit and hemoglobin.  Etiology of arterial embolism is unclear however he was on testosterone therapy injectable and also had COVID shot about 3 weeks prior to the event.  -Patient was evaluated by hematology, in the context of testosterone therapy, no recommendation for routine anticoagulation.  Advised to monitor hematocrit closely.  -With hematology workup he had mildly elevated anticardiolipin antibody and mild elevation of D-dimer.  Patient will talk regarding D-dimer with oncology.  There is a plan to repeat anticardiolipin antibody, will check with the next set of lab in 3 months, if remained abnormal will refer to hematology.  -Patient wants to wait until the repeat lab work for anticardiolipin antibody in 3 months to  restart testosterone therapy.  -Recent testosterone level is slowly improving in the late morning 226.  Discussed that he has been off of testosterone therapy for more than a year, there is a potential chance that endogenous testosterone can improve after stopping exogenous testosterone therapy.  We also discussed that reference range on the lab results are designed for adult male, which may not apply for his age.  Mildly low level of testosterone would be acceptable.  - If we plan for testosterone therapy I  would prefer topical or nasal like natesto, not  injectable testosterone.   # Chronic fatigue/myalgia : Patient had chronic fatigue and extreme weakness.  Discussed various etiologies.  He had abnormal thyroid function test in the past.  Currently not on thyroid medication.  Plan: -Will check thyroid function test today.  If the TSH is on the upper end of normal we will consider low-dose of levothyroxine to see if there is any symptomatic benefit. -Discussed that fatigue can have multiple etiologies, he has sleep disturbance, fatigue and snores at night.  Advised to talk with primary care provider to consider sleep study to rule out obstructive sleep apnea. -No plan for testosterone therapy at this time. -Will check total, free testosterone along with gonadotropin prior to follow-up visit in 3 months.  Will also recheck cardiolipin antibodies as recommended by hematology/oncology.  Patient is asked to check labs in the early morning fasting.   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  All questions answered and patient verbalized understanding of the plan.  [place] Anayeli Arel, MD [facility] Endocrinology [facility] [address] [place], [address] [place], [place] [address] Phone # [phone]  At least part of this note was generated using voice recognition software. Inadvertent word errors may have occurred, which were not recognized during the proofreading process.

## 2023-05-25 ENCOUNTER — Other Ambulatory Visit: Payer: Self-pay | Admitting: Family Medicine

## 2023-05-25 LAB — T4, FREE: Free T4: 1.1 ng/dL (ref 0.8–1.8)

## 2023-05-25 LAB — TSH: TSH: 2.25 m[IU]/L (ref 0.40–4.50)

## 2023-05-26 ENCOUNTER — Encounter: Payer: Self-pay | Admitting: Endocrinology

## 2023-05-26 ENCOUNTER — Other Ambulatory Visit: Payer: Self-pay | Admitting: Family Medicine

## 2023-06-13 ENCOUNTER — Ambulatory Visit (INDEPENDENT_AMBULATORY_CARE_PROVIDER_SITE_OTHER)

## 2023-06-13 DIAGNOSIS — G459 Transient cerebral ischemic attack, unspecified: Secondary | ICD-10-CM

## 2023-06-14 LAB — CUP PACEART REMOTE DEVICE CHECK
Date Time Interrogation Session: 20250309231819
Implantable Pulse Generator Implant Date: 20240305

## 2023-06-15 NOTE — Progress Notes (Signed)
 [facility] Summary Report / Loop Recorder

## 2023-06-15 NOTE — Addendum Note (Signed)
 Addended by: Geralyn Flash D on: 06/15/2023 03:31 PM   Modules accepted: Orders

## 2023-06-16 ENCOUNTER — Encounter: Payer: Self-pay | Admitting: Cardiology

## 2023-06-21 DIAGNOSIS — D045 Carcinoma in situ of skin of trunk: Secondary | ICD-10-CM | POA: Diagnosis not present

## 2023-06-26 ENCOUNTER — Other Ambulatory Visit: Payer: Self-pay | Admitting: Family Medicine

## 2023-06-27 ENCOUNTER — Other Ambulatory Visit: Payer: Self-pay

## 2023-06-27 ENCOUNTER — Other Ambulatory Visit (HOSPITAL_BASED_OUTPATIENT_CLINIC_OR_DEPARTMENT_OTHER): Payer: Self-pay

## 2023-06-27 MED ORDER — CLONAZEPAM 0.5 MG PO TABS
0.5000 mg | ORAL_TABLET | Freq: Two times a day (BID) | ORAL | 1 refills | Status: DC | PRN
  Filled 2023-06-27 – 2023-07-21 (×3): qty 180, 90d supply, fill #0
  Filled 2023-10-31: qty 180, 90d supply, fill #1
  Filled ????-??-??: fill #0

## 2023-06-28 ENCOUNTER — Other Ambulatory Visit (HOSPITAL_COMMUNITY): Payer: Self-pay

## 2023-06-28 ENCOUNTER — Other Ambulatory Visit (HOSPITAL_BASED_OUTPATIENT_CLINIC_OR_DEPARTMENT_OTHER): Payer: Self-pay

## 2023-07-06 ENCOUNTER — Ambulatory Visit (INDEPENDENT_AMBULATORY_CARE_PROVIDER_SITE_OTHER): Payer: Medicare Other

## 2023-07-06 VITALS — Ht 68.0 in | Wt 173.0 lb

## 2023-07-06 DIAGNOSIS — Z0000 Encounter for general adult medical examination without abnormal findings: Secondary | ICD-10-CM | POA: Diagnosis not present

## 2023-07-06 NOTE — Progress Notes (Signed)
 Subjective:   [name] Page is a 73 y.o. who presents for a Medicare Wellness preventive visit.  Visit Complete: Virtual I connected with  [name] Page on 07/06/23 by a audio enabled telemedicine application and verified that I am speaking with the correct person using two identifiers.  Patient Location: Home  Provider Location: Home Office  I discussed the limitations of evaluation and management by telemedicine. The patient expressed understanding and agreed to proceed.  Vital Signs: Because this visit was a virtual/telehealth visit, some criteria may be missing or patient reported. Any vitals not documented were not able to be obtained and vitals that have been documented are patient reported.  VideoDeclined- This patient declined Librarian, academic. Therefore the visit was completed with audio only.  Persons Participating in Visit: Patient.  AWV Questionnaire: No: Patient Medicare AWV questionnaire was not completed prior to this visit.  Cardiac Risk Factors include: advanced age (>54men, >37 women);hypertension;male gender     Objective:    Today's Vitals   07/06/23 1127  Weight: 173 lb (78.5 kg)  Height: 5\' 8"  (1.727 m)   Body mass index is 26.3 kg/m.     07/06/2023   11:37 AM 06/28/2022   10:12 AM 06/24/2022   10:22 AM 04/13/2022    7:32 PM 06/19/2021    9:44 AM 12/23/2015    4:46 PM 12/23/2015    9:24 AM  Advanced Directives  Does Patient Have a Medical Advance Directive? No No No No No  No  Does patient want to make changes to medical advance directive?     Yes (MAU/Ambulatory/Procedural Areas - Information given)    Would patient like information on creating a medical advance directive? No - Patient declined No - Patient declined No - Patient declined   No - patient declined information No - patient declined information    Current Medications (verified) Outpatient Encounter Medications as of 07/06/2023  Medication Sig    Acetylcarnitine HCl (ACETYL L-CARNITINE PO) Take by mouth.   Ascorbic Acid (VITAMIN C) 1000 MG tablet    aspirin EC 325 MG tablet Take 325 mg by mouth daily.   buPROPion (WELLBUTRIN XL) 300 MG 24 hr tablet Take 1 tablet (300 mg total) by mouth daily.   Cholecalciferol (VITAMIN D) 2000 UNITS CAPS Take 1 capsule by mouth daily.   clonazePAM (KLONOPIN) 0.5 MG tablet Take 1 tablet (0.5 mg total) by mouth 2 (two) times daily as needed for anxiety (do not drive for 12 hours after taking).   Cyanocobalamin (B-12) 2500 MCG TABS Take by mouth.   diltiazem (CARDIZEM CD) 180 MG 24 hr capsule TAKE 1 CAPSULE BY MOUTH DAILY   gabapentin (NEURONTIN) 300 MG capsule TAKE 3 CAPSULES BY MOUTH AT  BEDTIME   hydrochlorothiazide (HYDRODIURIL) 25 MG tablet Take 1 tablet (25 mg total) by mouth daily.   HYDROcodone-acetaminophen (NORCO/VICODIN) 5-325 MG tablet Take 0.5-1 tablets by mouth 2 (two) times daily as needed (No more than 4x a week use. 3 month supply).   methylcellulose (CITRUCEL) oral powder Use as directed once daily   potassium chloride (KLOR-CON) 10 MEQ tablet TAKE 3 TABLETS BY MOUTH IN THE  MORNING AND 2 TABLETS BY MOUTH  IN THE AFTERNOON   rosuvastatin (CRESTOR) 5 MG tablet Take 1 tablet (5 mg total) by mouth daily.   [DISCONTINUED] omeprazole (PRILOSEC) 40 MG capsule Take 1 capsule (40 mg total) by mouth daily.   No facility-administered encounter medications on file as of 07/06/2023.  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   Cataract    forming   Depressive disorder, not elsewhere classified    Diverticulitis    X 1 EPISODE   Esophageal reflux    past hx    HTN  (hypertension)    Hyperlipidemia    CONTROLLED   Lymphocytic colitis    Neuromuscular disorder (HCC)    neuropathy   Neuropathy    Other testicular hypofunction    Palpitations    Personal history of urinary calculi    LAST IN THE 80'S   Past Surgical History:  Procedure Laterality Date   COLONOSCOPY     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   [name]   parotid tumor surgery     [place]- [place]   POLYPECTOMY     TONSILLECTOMY AND ADENOIDECTOMY     Family History  Problem Relation Age of Onset   Hypertension Mother    Anxiety disorder Mother    Non-Hodgkin's lymphoma Mother    Dementia Father    Depression Father        and anxiety   Hypertension Father    Sudden death Paternal Uncle 61   Colon cancer Paternal Uncle    Hypertension Brother    Early death Neg Hx    Heart disease Neg Hx        mother in 46s had lesoin   Colon polyps Neg Hx    Esophageal cancer Neg Hx    Rectal cancer Neg Hx    Stomach cancer Neg Hx    Social History   Socioeconomic History   Marital status: Married    Spouse name: Not on file   Number of children: 0   Years of education: 18   Highest education level: Bachelor's degree (e.g., BA, AB, BS)  Occupational History   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 Married 30 + years. No children, lots of critters.       Cancer Survivor- carcinoid lung cancer.   PA-Worked with  Dr. 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 Micah Flesher back to work for ability to be insured.       Hobbies: music, home repair/improvement, cats   Social Drivers of Health   Financial Resource Strain: Low Risk  (07/06/2023)   Overall Financial Resource Strain (CARDIA)    Difficulty of Paying Living Expenses: Not hard at all  Food Insecurity: No Food Insecurity (07/06/2023)   Hunger Vital Sign  Worried About Programme researcher, broadcasting/film/video in the Last Year: Never true    Ran Out of Food in the Last Year: Never true  Transportation Needs: No Transportation Needs (07/06/2023)   PRAPARE - Administrator, Civil Service (Medical): No    Lack of Transportation (Non-Medical): No  Physical Activity: Sufficiently Active (07/06/2023)   Exercise Vital Sign    Days of Exercise per Week: 3 days    Minutes of Exercise per Session: 60 min  Stress: No Stress Concern Present (07/06/2023)   [employer] of Occupational Health - Occupational Stress Questionnaire    Feeling of Stress : Only a little  Social Connections: Moderately Isolated (07/06/2023)   Social Connection and Isolation Panel [NHANES]    Frequency of Communication with Friends and Family: Once a week    Frequency of Social Gatherings with Friends and Family: Three times a week    Attends Religious Services: Never    Active Member of Clubs or Organizations: No    Attends Banker Meetings: Never    Marital Status: Married    Tobacco Counseling Counseling given: Not Answered Tobacco comments: exposed to second hand smoke alot when he was younger    Clinical Intake:  Pre-visit preparation completed: Yes  Pain : No/denies pain     BMI - recorded: 26.3 Nutritional Status: BMI 25 -29 Overweight Nutritional Risks: None Diabetes: No  Lab Results  Component Value Date   HGBA1C 5.7 10/27/2022   HGBA1C 5.6 04/19/2022   HGBA1C 5.9 02/11/2022     How often do you need to have someone help you when you read  instructions, pamphlets, or other written materials from your doctor or pharmacy?: 1 - Never  Interpreter Needed?: No  Information entered by :: Lanier Ensign, LPN   Activities of Daily Living     07/06/2023   11:28 AM  In your present state of health, do you have any difficulty performing the following activities:  Hearing? 1  Comment hearing aids  Vision? 0  Difficulty concentrating or making decisions? 0  Walking or climbing stairs? 1  Comment use a staph on un even ground  Dressing or bathing? 1  Comment at times  Doing errands, shopping? 0  Preparing Food and eating ? N  Using the Toilet? N  In the past six months, have you accidently leaked urine? N  Do you have problems with loss of bowel control? N  Managing your Medications? N  Managing your Finances? N  Housekeeping or managing your Housekeeping? N    Patient Care Team: Shelva Majestic, MD as PCP - General (Family Medicine) Lanier Prude, MD as PCP - Electrophysiology (Cardiology) Thapa, [place], MD as Consulting Physician (Endocrinology)  Indicate any recent Medical Services you may have received from other than [facility] providers in the past year (date may be approximate).     Assessment:   This is a routine wellness examination for [name] Page.  Hearing/Vision screen Hearing Screening - Comments:: Pt has hearing aids  Vision Screening - Comments:: Pt follows up with dr Burgess Estelle for annual eye exams    Goals Addressed             This Visit's Progress    Patient Stated       Maintain health and activity        Depression Screen     07/06/2023   11:37 AM 05/02/2023   10:21 AM 02/28/2023   10:11 AM 10/27/2022   11:00 AM  06/28/2022   10:11 AM 06/16/2022   10:11 AM 02/11/2022    4:50 PM  PHQ 2/9 Scores  PHQ - 2 Score 1 2 1 2  0 2 0  PHQ- 9 Score  12 3 10  0 5 2    Fall Risk     07/06/2023   11:39 AM 02/28/2023   10:11 AM 10/27/2022   11:00 AM 10/27/2022   10:54 AM 06/27/2022    3:40 PM  Fall Risk    Falls in the past year? 1 1 1  0 1  Number falls in past yr: 1 1 1  0 1  Injury with Fall? 1 1 1  0 1  Comment back soar      Risk for fall due to : History of fall(s);Impaired balance/gait;Impaired mobility History of fall(s)   Impaired vision;Impaired balance/gait  Follow up Falls prevention discussed Falls evaluation completed   Falls prevention discussed    MEDICARE RISK AT HOME:  Medicare Risk at Home Any stairs in or around the home?: Yes If so, are there any without handrails?: No Home free of loose throw rugs in walkways, pet beds, electrical cords, etc?: Yes Adequate lighting in your home to reduce risk of falls?: Yes Life alert?: No Use of a cane, walker or w/c?: Yes (uses a staph as needed) Grab bars in the bathroom?: Yes Shower chair or bench in shower?: Yes Elevated toilet seat or a handicapped toilet?: No  TIMED UP AND GO:  Was the test performed?  No  Cognitive Function: 6CIT completed      11/08/2016    9:41 AM  Montreal Cognitive Assessment   Visuospatial/ Executive (0/5) 4  Naming (0/3) 3  Attention: Read list of digits (0/2) 1  Attention: Read list of letters (0/1) 1  Attention: Serial 7 subtraction starting at 100 (0/3) 3  Language: Repeat phrase (0/2) 2  Language : Fluency (0/1) 0  Abstraction (0/2) 2  Delayed Recall (0/5) 5  Orientation (0/6) 6  Total 27  Adjusted Score (based on education) 27      07/06/2023   11:40 AM 06/28/2022   10:15 AM 06/19/2021    9:48 AM  6CIT Screen  What Year? 0 points 0 points 0 points  What month? 0 points 0 points 0 points  What time? 0 points 0 points 0 points  Count back from 20 0 points 0 points 0 points  Months in reverse 0 points 0 points 0 points  Repeat phrase 0 points 0 points 0 points  Total Score 0 points 0 points 0 points    Immunizations Immunization History  Administered Date(s) Administered   Fluad Quad(high Dose 65+) 01/16/2019, 01/28/2020, 01/25/2021   Fluad Trivalent(High Dose 65+) 02/15/2023    Influenza Whole 01/04/2012   Influenza, High Dose Seasonal PF 12/21/2017, 01/21/2022   Influenza,inj,Quad PF,6+ Mos 12/17/2014   Influenza-Unspecified 01/17/2014, 12/28/2015, 01/01/2017   PFIZER Comirnaty(Gray Top)Covid-19 Tri-Sucrose Vaccine 08/18/2020   PFIZER(Purple Top)SARS-COV-2 Vaccination 04/19/2019, 05/14/2019, 12/31/2019, 12/25/2020   Pfizer Covid-19 Vaccine Bivalent Booster 64yrs & up 12/23/2020   Pfizer(Comirnaty)Fall Seasonal Vaccine 12 years and older 03/23/2022   Pneumococcal Conjugate-13 06/17/2015   Pneumococcal Polysaccharide-23 09/07/2016   Td 02/27/2001, 04/10/2011   Tdap 05/28/2021   Zoster Recombinant(Shingrix) 04/21/2021, 08/26/2021   Zoster, Live 08/24/2010    Screening Tests Health Maintenance  Topic Date Due   COVID-19 Vaccine (7 - 2024-25 season) 12/05/2022   INFLUENZA VACCINE  11/04/2023   Medicare Annual Wellness (AWV)  07/05/2024   Colonoscopy  02/24/2031  DTaP/Tdap/Td (4 - Td or Tdap) 05/29/2031   Pneumonia Vaccine 30+ Years old  Completed   Hepatitis C Screening  Completed   Zoster Vaccines- Shingrix  Completed   HPV VACCINES  Aged Out    Health Maintenance  Health Maintenance Due  Topic Date Due   COVID-19 Vaccine (7 - 2024-25 season) 12/05/2022   Health Maintenance Items Addressed: See Nurse Notes  Additional Screening:  Vision Screening: Recommended annual ophthalmology exams for early detection of glaucoma and other disorders of the eye.  Dental Screening: Recommended annual dental exams for proper oral hygiene  Community Resource Referral / Chronic Care Management: CRR required this visit?  No   CCM required this visit?  No     Plan:     I have personally reviewed and noted the following in the patient's chart:   Medical and social history Use of alcohol, tobacco or illicit drugs  Current medications and supplements including opioid prescriptions. Patient is currently taking opioid prescriptions. Information provided  to patient regarding non-opioid alternatives. Patient advised to discuss non-opioid treatment plan with their provider. Functional ability and status Nutritional status Physical activity Advanced directives List of other physicians Hospitalizations, surgeries, and ER visits in previous 12 months Vitals Screenings to include cognitive, depression, and falls Referrals and appointments  In addition, I have reviewed and discussed with patient certain preventive protocols, quality metrics, and best practice recommendations. A written personalized care plan for preventive services as well as general preventive health recommendations were provided to patient.     Marzella Schlein, LPN   05/06/3084   After Visit Summary: (MyChart) Due to this being a telephonic visit, the after visit summary with patients personalized plan was offered to patient via MyChart   Notes: Nothing significant to report at this time.

## 2023-07-06 NOTE — Patient Instructions (Signed)
 Mr. Holtman , Thank you for taking time to come for your Medicare Wellness Visit. I appreciate your ongoing commitment to your health goals. Please review the following plan we discussed and let me know if I can assist you in the future.   Referrals/Orders/Follow-Ups/Clinician Recommendations: Aim for 30 minutes of exercise or brisk walking, 6-8 glasses of water, and 5 servings of fruits and vegetables each day.   This is a list of the screening recommended for you and due dates:  Health Maintenance  Topic Date Due   COVID-19 Vaccine (7 - 2024-25 season) 12/05/2022   Flu Shot  11/04/2023   Medicare Annual Wellness Visit  07/05/2024   Colon Cancer Screening  02/24/2031   DTaP/Tdap/Td vaccine (4 - Td or Tdap) 05/29/2031   Pneumonia Vaccine  Completed   Hepatitis C Screening  Completed   Zoster (Shingles) Vaccine  Completed   HPV Vaccine  Aged Out    Advanced directives: (Declined) Advance directive discussed with you today. Even though you declined this today, please call our office should you change your mind, and we can give you the proper paperwork for you to fill out.  Next Medicare Annual Wellness Visit scheduled for next year: Yes

## 2023-07-18 ENCOUNTER — Other Ambulatory Visit: Payer: Self-pay | Admitting: Family Medicine

## 2023-07-18 ENCOUNTER — Other Ambulatory Visit (HOSPITAL_BASED_OUTPATIENT_CLINIC_OR_DEPARTMENT_OTHER): Payer: Self-pay

## 2023-07-18 ENCOUNTER — Encounter: Payer: Self-pay | Admitting: Family Medicine

## 2023-07-18 ENCOUNTER — Ambulatory Visit (INDEPENDENT_AMBULATORY_CARE_PROVIDER_SITE_OTHER)

## 2023-07-18 DIAGNOSIS — G459 Transient cerebral ischemic attack, unspecified: Secondary | ICD-10-CM | POA: Diagnosis not present

## 2023-07-18 LAB — CUP PACEART REMOTE DEVICE CHECK
Date Time Interrogation Session: 20250413231827
Implantable Pulse Generator Implant Date: 20240305

## 2023-07-18 MED ORDER — HYDROCODONE-ACETAMINOPHEN 5-325 MG PO TABS
0.5000 | ORAL_TABLET | Freq: Two times a day (BID) | ORAL | 0 refills | Status: DC | PRN
  Filled 2023-07-18: qty 55, 28d supply, fill #0

## 2023-07-19 ENCOUNTER — Encounter: Payer: Self-pay | Admitting: Cardiology

## 2023-07-19 ENCOUNTER — Other Ambulatory Visit (HOSPITAL_BASED_OUTPATIENT_CLINIC_OR_DEPARTMENT_OTHER): Payer: Self-pay

## 2023-07-21 ENCOUNTER — Other Ambulatory Visit (HOSPITAL_COMMUNITY): Payer: Self-pay

## 2023-07-21 ENCOUNTER — Other Ambulatory Visit: Payer: Self-pay

## 2023-07-21 ENCOUNTER — Other Ambulatory Visit (HOSPITAL_BASED_OUTPATIENT_CLINIC_OR_DEPARTMENT_OTHER): Payer: Self-pay

## 2023-07-25 NOTE — Progress Notes (Signed)
 [facility] Summary Report / Loop Recorder

## 2023-07-25 NOTE — Addendum Note (Signed)
 Addended by: Edra Govern D on: 07/25/2023 05:10 PM   Modules accepted: Orders

## 2023-08-09 ENCOUNTER — Other Ambulatory Visit: Payer: Self-pay

## 2023-08-15 ENCOUNTER — Encounter: Payer: Self-pay | Admitting: Endocrinology

## 2023-08-15 DIAGNOSIS — E876 Hypokalemia: Secondary | ICD-10-CM

## 2023-08-15 NOTE — Telephone Encounter (Signed)
 I have placed order for BMP to check fasting glucose and potassium level.  Please include the lab he is going to do tomorrow.

## 2023-08-16 ENCOUNTER — Other Ambulatory Visit: Payer: Medicare Other

## 2023-08-16 DIAGNOSIS — E876 Hypokalemia: Secondary | ICD-10-CM | POA: Diagnosis not present

## 2023-08-16 DIAGNOSIS — I749 Embolism and thrombosis of unspecified artery: Secondary | ICD-10-CM | POA: Diagnosis not present

## 2023-08-16 DIAGNOSIS — G459 Transient cerebral ischemic attack, unspecified: Secondary | ICD-10-CM | POA: Diagnosis not present

## 2023-08-20 LAB — CARDIOLIPIN ANTIBODIES, IGG, IGM, IGA
Anticardiolipin IgA: <2 [APL'U]/mL (ref <20.0)
Anticardiolipin IgG: <2 [GPL'U]/mL (ref <20.0)
Anticardiolipin IgM: 16.4 [MPL'U]/mL (ref <20.0)

## 2023-08-20 LAB — BASIC METABOLIC PANEL WITH GFR
BUN: 15 mg/dL (ref 7–25)
CO2: 31 mmol/L (ref 20–32)
Calcium: 9.5 mg/dL (ref 8.6–10.3)
Chloride: 102 mmol/L (ref 98–110)
Creat: 0.82 mg/dL (ref 0.70–1.28)
Glucose, Bld: 112 mg/dL — ABNORMAL HIGH (ref 65–99)
Potassium: 4.2 mmol/L (ref 3.5–5.3)
Sodium: 140 mmol/L (ref 135–146)
eGFR: 93 mL/min/{1.73_m2} (ref >=60)

## 2023-08-20 LAB — LUTEINIZING HORMONE: LH: 9.8 m[IU]/mL (ref 1.6–15.2)

## 2023-08-20 LAB — TESTOSTERONE, FREE: TESTOSTERONE FREE: 20 pg/mL (ref 6.0–73.0)

## 2023-08-20 LAB — FOLLICLE STIMULATING HORMONE: FSH: 23.4 m[IU]/mL — ABNORMAL HIGH (ref 1.4–12.8)

## 2023-08-20 LAB — TESTOSTERONE, TOTAL, LC/MS/MS: Testosterone, Total, LC-MS-MS: 189 ng/dL — ABNORMAL LOW (ref 250–1100)

## 2023-08-22 ENCOUNTER — Ambulatory Visit

## 2023-08-22 ENCOUNTER — Ambulatory Visit: Payer: Self-pay | Admitting: Endocrinology

## 2023-08-22 DIAGNOSIS — G459 Transient cerebral ischemic attack, unspecified: Secondary | ICD-10-CM

## 2023-08-22 LAB — CUP PACEART REMOTE DEVICE CHECK
Date Time Interrogation Session: 20250518232815
Implantable Pulse Generator Implant Date: 20240305

## 2023-08-23 ENCOUNTER — Ambulatory Visit: Payer: Self-pay | Admitting: Cardiology

## 2023-08-23 ENCOUNTER — Ambulatory Visit: Payer: Medicare Other | Admitting: Endocrinology

## 2023-08-23 ENCOUNTER — Encounter: Payer: Self-pay | Admitting: Endocrinology

## 2023-08-23 VITALS — BP 126/72 | HR 61 | Resp 20 | Ht 68.0 in | Wt 180.8 lb

## 2023-08-23 DIAGNOSIS — E291 Testicular hypofunction: Secondary | ICD-10-CM

## 2023-08-23 DIAGNOSIS — R7301 Impaired fasting glucose: Secondary | ICD-10-CM

## 2023-08-23 DIAGNOSIS — E559 Vitamin D deficiency, unspecified: Secondary | ICD-10-CM | POA: Diagnosis not present

## 2023-08-23 NOTE — Progress Notes (Signed)
 Outpatient Endocrinology Note [place] Ryleigh Esqueda, MD   Patient's Name: [name] Page    DOB: 03-15-1951    MRN: [id number]  REASON OF VISIT: Follow up for hypogonadism / low testosterone .   PCP:  Almira Jaeger, MD  HISTORY OF PRESENT ILLNESS:   [name] Page is a 73 y.o. old male with past medical history listed below, is here for follow up for hypogonadism / low testosterone .  Pertinent Hx: Patient was previously seen by Dr. Hubert Madden and was last time seen in November 05, 2022.  Patient was diagnosed with hypogonadism around 2012.  Symptoms at the time of diagnosis were increased depression, decreased libido.  No history of hot flashes, increased sweating, breast enlargement, long-term use of anabolic steroid, testicular injury, head injury or mumps in the childhood.  No history of osteopenia or low impact bone fracture. At that time an afternoon testosterone  level was 214 and free testosterone  level low at 32, normal >47. He was also evaluated by a urologist and details were not available.  He was treated with testosterone  gel but he thinks this did not improve his testosterone  levels and likely did not continue treatment. Subsequently he was again evaluated in 2015 and with a low testosterone  of 239 he was started on testosterone  injections. With this he thinks she had some improvement in his fatigue and depression His dose had been adjusted from his initial starting dose of 100 mg every 2 weeks and he has been on various regimens including every 3 or 4 weeks injections. His testosterone  level had been quite variable since about 2016.  He had a history of erythrocytosis due to testosterone  treatment in the past in 2018, 2019, 2020 timeframe.   He was taking 30 mg of testosterone  weekly,was stopped in January 2024.  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  He was evaluated by ophthalmology as well.  He was evaluated  by neurology and cardiology including evaluation for coagulopathy, unrevealing for the cause of embolism.  He was on testosterone  injection therapy at that time and was stopped by neurology.  This event was 3 weeks after COVID shot.  Around timeframe of end of 2023 he had testosterone  in the mid normal range 575.  No erythrocytosis at that time with hematocrit 41.4 and hemoglobin 16.8.  Patient was advised for clomiphene  due to continued fatigue and low testosterone  level in June 2024 however patient hesitant to start due to potential risks of thromboembolism.  He also checked with neurology.  In May 02, 2023 patient was evaluated by hematology and oncology for possible embolic TIA, in the context of testosterone  therapy, recommended no routine indication of anticoagulation in this context, mention potential risks with COVID-19 booster and clotting.  In regard to testosterone  therapy, advised for close monitoring of hematocrit levels, when the therapy resumed if desired.   Interval history Patient has complaints of intermittent weakness, fatigue, myalgia.  He has low motivation.  Recent lab with testosterone  level total testosterone  level mildly low with normal free testosterone , high normal LH and elevated FSH overall consistent with mild primary hypogonadism.  Lab results for cardiolipin antibodies normal recently.   Latest Reference Range & Units 08/16/23 08:05  LH 1.6 - 15.2 mIU/mL 9.8  FSH 1.4 - 12.8 mIU/mL 23.4 (H)  Glucose 65 - 99 mg/dL 782 (H)  Testosterone , Total, LC-MS-MS 250 - 1,100 ng/dL 956 (L)  Testosterone  Free 6.0 - 73.0 pg/mL 20.0  (H):  Data is abnormally high (L): Data is abnormally low  Patient is accompanied by his wife in the clinic today.  REVIEW OF SYSTEMS:  As per history of present illness.   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   Cataract    forming   Depressive disorder, not elsewhere classified    Diverticulitis    X 1 EPISODE   Esophageal reflux    past hx    HTN (hypertension)    Hyperlipidemia    CONTROLLED   Lymphocytic colitis    Neuromuscular disorder (HCC)    neuropathy   Neuropathy    Other testicular hypofunction    Palpitations    Personal history of urinary calculi    LAST IN THE 80'S    PAST SURGICAL HISTORY: Past Surgical History:  Procedure Laterality Date   COLONOSCOPY     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 States not an allergy.    Flagyl [Metronidazole Hcl] Other (See Comments)    Neuropathy    Ibuprofen Other (See Comments)   Nsaids     REACTION: intolereance- ABD PAIN AND DIARRHEA    Protonix [Pantoprazole]     Abdominal Pain and Diarrhea    FAMILY HISTORY:  Family History  Problem Relation Age of Onset   Hypertension Mother    Anxiety disorder Mother    Non-Hodgkin's lymphoma Mother    Dementia Father    Depression Father        and anxiety   Hypertension Father    Sudden death Paternal Uncle 45   Colon cancer Paternal Uncle    Hypertension Brother    Early death Neg Hx    Heart disease Neg Hx        mother in 42s had lesoin   Colon polyps Neg Hx    Esophageal cancer Neg Hx    Rectal cancer Neg Hx    Stomach cancer Neg Hx     SOCIAL HISTORY: Social History   Socioeconomic History   Marital status: Married    Spouse name: Not on file   Number of children: 0   Years of education: 18   Highest education level: Bachelor's degree  (e.g., BA, AB, BS)  Occupational History   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 Married 30 + years. No children, lots of critters.       Cancer Survivor- carcinoid lung cancer.   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 Shelah Derry back to work for ability to be insured.       Hobbies: music, home repair/improvement, cats   Social Drivers of Health   Financial Resource Strain: Low Risk  (07/06/2023)   Overall Financial Resource Strain (CARDIA)    Difficulty of Paying Living Expenses: Not hard at all  Food Insecurity: No Food Insecurity (07/06/2023)   Hunger Vital Sign    Worried About Running Out of Food in the Last Year: Never true    Ran Out of Food in the Last Year: Never true  Transportation Needs: No Transportation Needs (07/06/2023)   PRAPARE - Administrator, Civil Service (Medical): No    Lack of Transportation (Non-Medical): No  Physical Activity: Sufficiently Active (07/06/2023)   Exercise Vital Sign    Days of Exercise per Week: 3 days    Minutes of Exercise per Session: 60 min  Stress: No Stress Concern Present (07/06/2023)   [employer] of Occupational Health - Occupational Stress Questionnaire    Feeling of Stress : Only a little  Social Connections: Moderately Isolated (07/06/2023)   Social Connection and Isolation Panel [NHANES]    Frequency of Communication with Friends and Family: Once a week     Frequency of Social Gatherings with Friends and Family: Three times a week    Attends Religious Services: Never    Active Member of Clubs or Organizations: No    Attends Banker Meetings: Never    Marital Status: Married    MEDICATIONS:  Current Outpatient Medications  Medication Sig Dispense Refill   Acetylcarnitine HCl (ACETYL L-CARNITINE PO) Take 500 mg by mouth.     Ascorbic Acid (VITAMIN C) 1000 MG tablet      aspirin  EC 325 MG tablet Take 325 mg by mouth daily.     buPROPion  (WELLBUTRIN  XL) 300 MG 24 hr tablet Take 1 tablet (300 mg total) by mouth daily. 90 tablet 3   Cholecalciferol (VITAMIN D ) 2000 UNITS CAPS Take 1 capsule by mouth daily.     clonazePAM  (KLONOPIN ) 0.5 MG tablet Take 1 tablet (0.5 mg total) by mouth 2 (two) times daily as needed for anxiety (do not drive for 12 hours after taking). 180 tablet 1   Cyanocobalamin  (B-12) 2500 MCG TABS Take by mouth.     diltiazem  (CARDIZEM  CD) 180 MG 24 hr capsule TAKE 1 CAPSULE BY MOUTH DAILY 100 capsule 2   gabapentin  (NEURONTIN ) 300 MG capsule TAKE 3 CAPSULES BY MOUTH AT  BEDTIME 300 capsule 2   hydrochlorothiazide  (HYDRODIURIL ) 25 MG tablet Take 1 tablet (25 mg total) by mouth daily. 90 tablet 3   HYDROcodone -acetaminophen  (NORCO/VICODIN) 5-325 MG tablet Take 0.5-1 tablets by mouth 2 (two) times daily as needed (No more than 4x a week use. 3 month supply). 55 tablet 0   methylcellulose (CITRUCEL) oral powder Use as directed once daily     potassium chloride  (KLOR-CON ) 10 MEQ tablet TAKE 3 TABLETS BY MOUTH IN THE  MORNING AND 2 TABLETS BY MOUTH  IN THE AFTERNOON 500 tablet 2   rosuvastatin  (CRESTOR ) 5 MG tablet Take 1 tablet (5 mg total) by mouth daily. 90 tablet 3   No current facility-administered medications for this visit.  PHYSICAL EXAM: Vitals:   08/23/23 1052  BP: 126/72  Pulse: 61  Resp: 20  SpO2: 97%  Weight: 180 lb 12.8 oz (82 kg)  Height: 5\' 8"  (1.727 m)    Body mass index is 27.49 kg/m.  Wt  Readings from Last 3 Encounters:  08/23/23 180 lb 12.8 oz (82 kg)  07/06/23 173 lb (78.5 kg)  05/24/23 185 lb (83.9 kg)    General: Well developed, well nourished male in no apparent distress.  HEENT: AT/[place], no external lesions. Hearing intact to the spoken word Eyes: EOMI. No proptosis, stare and lid lag. Conjunctiva clear and no icterus.  Neck: Trachea midline, neck supple without appreciable thyromegaly or lymphadenopathy and no palpable thyroid  nodules Lungs: Clear to auscultation, no wheeze. Respirations not labored Heart: S1S2, Regular in rate and rhythm. Abdomen: Soft, non tender, non distended Neurologic: Alert, oriented, normal speech, deep tendon biceps reflexes normal,  no gross focal neurological deficit. No obvious proximal weakness Extremities: No pedal pitting edema, no tremors of outstretched hands, no excessive hair growth Skin: Warm, color good. No gynecomastia.  Psychiatric: Does not appear depressed or anxious.   PERTINENT HISTORIC LABORATORY AND IMAGING STUDIES:  All pertinent laboratory results were reviewed. Please see HPI also for further details.   ASSESSMENT / PLAN  1. Hypogonadism in male   2. Fasting hyperglycemia   3. Vitamin D  deficiency     -Patient has history of hypogonadism at least from 2012, used to be on testosterone  therapy however was discontinued after left retinal branch artery occlusion/TIA possibly embolic in January 2024. -Patient has significantly low testosterone  level with symptoms of fatigue, low motivation/depression. -Patient was advised for clomiphene  in June 2024 however he is hesitant to take due to risks of possible thromboembolism. -When patient had possible embolic event in January 2024 he had mid normal level of testosterone  with normal hematocrit and hemoglobin.  Etiology of arterial embolism is unclear however he was on testosterone  therapy injectable and also had COVID shot about 3 weeks prior to the event.  -Patient was  evaluated by hematology, in the context of testosterone  therapy, no recommendation for routine anticoagulation.  Advised to monitor hematocrit closely.  -Laboratory results with mildly low total testosterone  with normal free testosterone  and elevated gonadotropins overall consistent with mild primary hypogonadism.  Plan: -Discussed options of testosterone  therapy versus monitoring without treatment.  Testosterone  levels on the provided reference ranges are not well-established for his age group.  Due to concern of potential blood clot patient prefers not to restart testosterone  therapy at this time. -Discussed that fatigue can have multiple etiologies, he has sleep disturbance, fatigue and snores at night.  Advised to talk with primary care provider to consider sleep study to rule out obstructive sleep apnea. -Discussed about optimizing bone health with adequate calcium  intake, he has been taking adequate dietary calcium  intake, he has adequate dietary calcium  intake.  He has also been taking vitamin D  supplement. - He had mild fasting hyperglycemia we will check hemoglobin A1c in acceptable lab. - He has history of vitamin deficiency, will check vitamin D  level in next of lab. - Check total and free testosterone  in 6 months prior to follow-up visit.  Diagnoses and all orders for this visit:  Hypogonadism in male -     Testosterone  , Free and Total  Fasting hyperglycemia -     Hemoglobin A1c  Vitamin D  deficiency -     VITAMIN D  25 Hydroxy (Vit-D Deficiency, Fractures)    DISPOSITION Follow  up in clinic in 6 months suggested.  Labs prior to follow-up visit as ordered.  All questions answered and patient verbalized understanding of the plan.  [place] Jeneal Vogl, MD [facility] Endocrinology [facility] [address] [place], [address] [place], [place] [address] Phone # [phone]  At least part of this note was generated using voice recognition software. Inadvertent word errors may  have occurred, which were not recognized during the proofreading process.

## 2023-09-06 ENCOUNTER — Ambulatory Visit (INDEPENDENT_AMBULATORY_CARE_PROVIDER_SITE_OTHER): Payer: Medicare Other | Admitting: Family Medicine

## 2023-09-06 ENCOUNTER — Ambulatory Visit

## 2023-09-06 ENCOUNTER — Encounter: Payer: Self-pay | Admitting: Family Medicine

## 2023-09-06 VITALS — BP 110/60 | HR 66 | Temp 97.7°F | Ht 68.0 in | Wt 181.0 lb

## 2023-09-06 DIAGNOSIS — Z85118 Personal history of other malignant neoplasm of bronchus and lung: Secondary | ICD-10-CM | POA: Diagnosis not present

## 2023-09-06 DIAGNOSIS — E663 Overweight: Secondary | ICD-10-CM

## 2023-09-06 DIAGNOSIS — E781 Pure hyperglyceridemia: Secondary | ICD-10-CM

## 2023-09-06 DIAGNOSIS — L570 Actinic keratosis: Secondary | ICD-10-CM | POA: Diagnosis not present

## 2023-09-06 DIAGNOSIS — E559 Vitamin D deficiency, unspecified: Secondary | ICD-10-CM

## 2023-09-06 DIAGNOSIS — Z131 Encounter for screening for diabetes mellitus: Secondary | ICD-10-CM

## 2023-09-06 DIAGNOSIS — I10 Essential (primary) hypertension: Secondary | ICD-10-CM | POA: Diagnosis not present

## 2023-09-06 DIAGNOSIS — R0989 Other specified symptoms and signs involving the circulatory and respiratory systems: Secondary | ICD-10-CM | POA: Diagnosis not present

## 2023-09-06 DIAGNOSIS — Z125 Encounter for screening for malignant neoplasm of prostate: Secondary | ICD-10-CM | POA: Diagnosis not present

## 2023-09-06 NOTE — Progress Notes (Signed)
 Phone: [phone]   Subjective:  Patient presents today for their annual physical. Chief complaint-noted.   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   Cataract    forming   Depressive disorder, not elsewhere classified    Diverticulitis    X 1 EPISODE   Esophageal reflux    past hx    HTN (hypertension)    Hyperlipidemia    CONTROLLED   Lymphocytic colitis    Neuromuscular disorder (HCC)    neuropathy   Neuropathy    Other testicular hypofunction    Palpitations    Personal history of urinary calculi    LAST IN THE 80'S   Stroke [facility]) April 13, 2022   Patient Active Problem List   Diagnosis Date Noted   Chronic pain syndrome 08/31/2016    Priority: High   History of lung cancer 12/11/2012    Priority: High   Vitamin D  deficiency 02/11/2022    Priority: Medium    Aortic atherosclerosis (HCC) 11/03/2020    Priority: Medium    History of adenomatous polyp of colon 03/24/2018    Priority: Medium    Generalized anxiety disorder 03/12/2014    Priority: Medium    Essential hypertension 03/12/2014    Priority: Medium    Peripheral neuropathy, idiopathic 12/22/2012    Priority: Medium    Dyspnea 07/14/2012    Priority: Medium    Hypertriglyceridemia 07/14/2012    Priority: Medium    Depression 02/16/2010    Priority: Medium    Low testosterone  11/21/2007    Priority: Medium    Chronic bilateral low back pain 06/05/2019    Priority: Low   Venous stasis of both lower extremities 06/05/2019     Priority: Low   Mastoiditis 03/09/2017    Priority: Low   Palpitations 08/11/2016    Priority: Low   Eustachian tube dysfunction 12/17/2014    Priority: Low   SI (sacroiliac) joint dysfunction 09/19/2013    Priority: Low   Nonspecific abnormal electrocardiogram (ECG) (EKG) 07/14/2012    Priority: Low   Allergic reaction, history of 10/25/2011    Priority: Low   Rectal or anal pain 10/01/2010    Priority: Low   Allergic rhinitis 06/13/2007    Priority: Low   GERD 06/13/2007    Priority: Low   IBS (irritable bowel syndrome) 06/13/2007    Priority: Low   Lumbar trigger point syndrome 01/11/2023    Priority: 1.   Nonallopathic lesion of sacral region 09/19/2013    Priority: 1.   Nonallopathic lesion of thoracic region 09/19/2013    Priority: 1.   Nonallopathic lesion of lumbosacral region 09/19/2013    Priority: 1.   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   [name]   parotid tumor surgery     [place]- [place]   POLYPECTOMY     TONSILLECTOMY AND ADENOIDECTOMY      Family History  Problem Relation Age of Onset   Hypertension Mother    Anxiety disorder Mother    Non-Hodgkin's lymphoma Mother    Cancer Mother    Heart disease Mother    Hyperlipidemia Mother    Varicose Veins Mother    Dementia Father    Depression Father        and anxiety   Hypertension Father    Hearing loss Father    Obesity Father    Sudden death Paternal Uncle 60   Colon cancer Paternal Uncle    Hypertension Brother    Obesity Brother    Diabetes Maternal Grandmother    Obesity Maternal Grandmother    Diabetes Paternal Grandmother    Obesity Paternal Grandmother    Early death Neg Hx    Colon polyps Neg Hx    Esophageal cancer Neg Hx    Rectal cancer Neg Hx    Stomach cancer Neg Hx     Medications- reviewed and updated Current Outpatient  Medications  Medication Sig Dispense Refill   Acetylcarnitine HCl (ACETYL L-CARNITINE PO) Take 500 mg by mouth.     Ascorbic Acid (VITAMIN C) 1000 MG tablet      aspirin  EC 325 MG tablet Take 325 mg by mouth daily.     buPROPion  (WELLBUTRIN  XL) 300 MG 24 hr tablet Take 1 tablet (300 mg total) by mouth daily. 90 tablet 3   Cholecalciferol (VITAMIN D ) 2000 UNITS CAPS Take 1 capsule by mouth daily.     clonazePAM  (KLONOPIN ) 0.5 MG tablet Take 1 tablet (0.5 mg total) by mouth 2 (two) times daily as needed for anxiety (do not drive for 12 hours after taking). 180 tablet 1   Cyanocobalamin  (B-12) 2500 MCG TABS Take by mouth.     diltiazem  (CARDIZEM  CD) 180 MG 24 hr capsule TAKE 1 CAPSULE BY MOUTH DAILY 100 capsule 2   gabapentin  (NEURONTIN ) 300 MG capsule TAKE 3 CAPSULES BY MOUTH AT  BEDTIME 300 capsule 2   hydrochlorothiazide  (HYDRODIURIL ) 25 MG tablet Take 1 tablet (25 mg total) by mouth daily. 90 tablet 3   HYDROcodone -acetaminophen  (NORCO/VICODIN) 5-325 MG tablet Take 0.5-1 tablets by mouth 2 (two) times daily as needed (No more than 4x a week use. 3 month supply). 55 tablet 0   methylcellulose (CITRUCEL) oral powder Use as directed once daily     potassium chloride  (KLOR-CON ) 10 MEQ tablet TAKE 3 TABLETS BY MOUTH IN THE  MORNING AND 2 TABLETS BY MOUTH  IN THE AFTERNOON 500 tablet 2   rosuvastatin  (CRESTOR ) 5 MG tablet Take 1 tablet (5 mg total) by mouth daily. 90 tablet 3   No current facility-administered medications for this visit.    Allergies-reviewed and updated Allergies  Allergen Reactions   Bee Venom Anaphylaxis   Colestipol  Diarrhea and Other (See Comments)   Epinephrine  Other (See Comments)    Per patient request to remove this. States not an allergy.    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 Married 30 + years. No children, lots of critters.  Cancer Survivor- carcinoid lung cancer.   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 Shelah Derry back to work for ability to be insured.       Hobbies: music, home repair/improvement, cats   Objective  Objective:  BP 110/60   Pulse 66   Temp 97.7 F (36.5 C)   Ht 5\' 8"  (1.727 m)   Wt 181 lb (82.1 kg)   SpO2 97%   BMI 27.52 kg/m  Gen: NAD, resting comfortably HEENT: Mucous membranes are moist. Oropharynx normal Neck: no thyromegaly CV: RRR no murmurs rubs or gallops Lungs: CTAB no crackles, wheeze, rhonchi Abdomen: soft/nontender/nondistended/normal bowel sounds. No rebound or guarding.  Ext: no edema Skin: warm, dry, slight scale on top of helix right ear with mild erythema Neuro: grossly normal, moves all extremities, PERRLA     Assessment and Plan  73 y.o. male presenting for annual physical.  Health Maintenance counseling: 1. Anticipatory guidance: Patient counseled regarding regular dental exams -q6 months, eye exams -yearly,  avoiding smoking and second hand smoke , limiting alcohol to 2 beverages per day - very rare, no illicit drugs .   2. Risk factor reduction:  Advised patient of need for regular exercise and diet rich and fruits and vegetables to reduce risk of heart attack and stroke.  Exercise- considering [employer] with trainer, treadmill 3x a week and weights 3x a week alternating.  Diet/weight management-weight stable from last physical- mediterranean diet and reasonably healthy.  Wt Readings from Last 3 Encounters:  09/06/23 181 lb (82.1 kg)  08/23/23 180 lb 12.8 oz (82 kg)  07/06/23 173 lb (78.5 kg)  3. Immunizations/screenings/ancillary studies-after TIA about 3 weeks after COVID-19 vaccination holding off on any further immunizations for COVID,  could technically do Prevnar 20, RSV still undecided  Immunization History  Administered Date(s) Administered   Fluad Quad(high Dose 65+) 01/16/2019, 01/28/2020, 01/25/2021   Fluad Trivalent(High Dose 65+) 02/15/2023   Influenza Whole 01/04/2012   Influenza, High Dose Seasonal PF 12/21/2017, 01/21/2022   Influenza,inj,Quad PF,6+ Mos 12/17/2014   Influenza-Unspecified 01/17/2014, 12/28/2015, 01/01/2017   PFIZER Comirnaty (Gray Top)Covid-19 Tri-Sucrose Vaccine 08/18/2020   PFIZER(Purple Top)SARS-COV-2 Vaccination 04/19/2019, 05/14/2019, 12/31/2019, 12/25/2020   Pfizer Covid-19 Vaccine Bivalent Booster 88yrs & up 12/23/2020   Pfizer(Comirnaty )Fall Seasonal Vaccine 12 years and older 03/23/2022   Pneumococcal Conjugate-13 06/17/2015   Pneumococcal Polysaccharide-23 09/07/2016   Td 02/27/2001, 04/10/2011   Tdap 05/28/2021   Zoster Recombinant(Shingrix ) 04/21/2021, 08/26/2021   Zoster, Live 08/24/2010  4. Prostate cancer screening-  low risk prior trend- update psa today-previously checked primarily due to testosterone  usage but now off Lab Results  Component Value Date   PSA 0.30 02/11/2022   PSA 0.26 11/12/2020   PSA 0.26 02/14/2020   5. Colon cancer screening - 02/23/2021 and graduated per result note even with polyp 6. Skin cancer screening-[facility]- had spots frozen on right ear that was frozen off . advised regular sunscreen use. Denies worrisome, changing, or new skin lesions.  #actinic keratosis at top of right ear helix-will return to dermatology  7. Smoking associated screening (lung cancer screening, AAA screen 65-75, UA)-never smoker--see below about lung cancer history 8. STD screening -opts out as monogamous  Status of chronic or acute concerns    AK- November follow up with dermatology and will discuss as our cryotank was getting refilled  #History of lung cancer/actually carcinoid tumor malignant-we follow an annual chest x-ray on him.  Last chest x-ray  09/28/22.  Also prior with  CT chest abdomen pelvis 03/05/2020.   -no further chest pressure/pain like last visit - will update his annual chest x-ray today  #Chronic pain yndrome/neuropathy S: Medication: Gabapentin  600- 900 mg at bedtime as occasionally affects balance higher dose, sparing hydrocodone  3-4x a week- reports good for the moment.  He knows to space hydrocodone  and clonazepam  at least 8 hours   A/P: neuropathy pain and balance issues continue to be bothersome. No falls thankful. Continue to monitor and continue current medications for now   -PDMP reviewed and low risk trend  -UDS last updated 08/26/21- update 10/27/22- and will check next visit -Controlled substance contract on file from4/08/2017  #hypertension/hypokalemia requiring potassium 20 mEq twice daily S: medication: hctz 25 mg, diltiazem  180 mg extended release (amlodipine  caused edema in past).   BP Readings from Last 3 Encounters:  09/06/23 110/60  08/23/23 126/72  05/24/23 138/80  A/P: stable- continue current medicines  - last potassium thankfully ok   # History of TIA due to embolism-prior extensive workup by neurology and cardiology with no obvious cause including cardiac monitoring.  Extensive hypercoagulable workup unremarkable with repeat lupus anticoagulant being normal.  Anticardiolipin antibodies indeterminate only.-Some concern this happened 3 weeks after COVID vaccination and could be related with need for more data-decision has been to maintain aspirin   # History of branch retinal artery occlusion (BRAO thought possibly embolic - 3 weeks after covid shot-confirmed by Dr. Dorothea Gata with left visual field disturbance in July 2024)-plans on loop recorder with Dr. Marven Slimmer #microvascular ischemia on prior MRI # Hyperlipidemia/hypertriglyceridemia. LPA not elevated # Aortic atherosclerosis-prior coronary artery calcium  score of 0 on 07/08/2020 S: Medication:Atorvastatin  40 mg--> 10mg  (patient  concern about neuropathy)--> rosuvastatin  5 mg (missing some doses as felt like contributing to neuropathy and felt like weakness in thigh muscles- off 2 weeks), asa 325 mg Lab Results  Component Value Date   CHOL 138 02/28/2023   HDL 62.40 02/28/2023   LDLCALC 62 02/28/2023   LDLDIRECT 57.0 10/27/2022   TRIG 72.0 02/28/2023   CHOLHDL 2 02/28/2023   A/P: suspect lipids may be mildly high as off statin 2 weeks and he wants baseline off medications so will come back in 2 weeks and we can reassess- ideally with prior BRAO and TIA would have LDL under 70- vegan diet could also be helpful- is at least doing mediterranean.  Aortic atherosclerosis (presumed stable)- LDL goal ideally <70 - may be above goal- hoping likely to get back on statin - still on aspirin  325 mg -may join [employer] and get Systems analyst   # Depression/GAD S: Medication:Wellbutrin  300 mg XL .  Uses clonazepam  1-2 times a day-we have discussed trying to cut back.  Has not tolerated SSRIs due to palpitations    09/06/2023    9:59 AM 07/06/2023   11:37 AM 05/02/2023   10:21 AM  Depression screen PHQ 2/9  Decreased Interest 0 0 1  Down, Depressed, Hopeless 0 1 1  PHQ - 2 Score 0 1 2  Altered sleeping 0  3  Tired, decreased energy 0  3  Change in appetite 0  0  Feeling bad or failure about yourself  0  0  Trouble concentrating 0  1  Moving slowly or fidgety/restless 0  3  Suicidal thoughts 0  0  PHQ-9 Score 0  12  Difficult doing work/chores Not difficult at all  Somewhat difficult  A/P: depression full remission- continue current medications   #Hypogonadism-on testosterone  treatment in the past- stopped  after BRAO in January 2024  -Opted out of clomid  due to thromboembolism risk -recheck planned in 6 months  # Hyperglycemia/insulin  resistance/prediabetes-A1c 5.9 in 2023  S:  Medication: none Lab Results  Component Value Date   HGBA1C 5.7 10/27/2022   HGBA1C 5.6 04/19/2022   HGBA1C 5.9 02/11/2022  A/P: hopefully  stable- update a1c today. Continue current meds for now   #Vitamin D  deficiency S: Medication:    vitamin D  at least 2000 units a day Last vitamin D  Lab Results  Component Value Date   VD25OH 39.75 09/28/2022  A/P: hopefully stable- update vitamin D   today. Continue current meds for now    Recommended follow up: Return in about 6 months (around 03/07/2024) for followup or sooner if needed.Schedule b4 you leave. Future Appointments  Date Time Provider Department Center  09/26/2023  7:25 AM [facility] DEVICE [facility]OT[facility]  10/31/2023  7:25 AM [facility] DEVICE [facility]OT[facility]  12/06/2023  7:35 AM [facility] DEVICE REMOT[facility]  01/09/2024  7:25 AM [facility] DEVICE [facility]OT[facility]  02/13/2024  7:25 AM [facility] DEVICE REMOTES [facility]  02/16/2024  8:00 AM [facility]NEURO LAB [facility] None  02/22/2024 10:00 AM [place], [place], MD [facility] None  03/19/2024  7:25 AM [facility] DEVICE REMOT[facility]   Lab/Order associations:will come back fasting   ICD-10-CM   1. History of lung cancer  Z85.118 DG Chest 2 View    2. Screening for prostate cancer  Z12.5 PSA, Medicare    3. Screening for diabetes mellitus  Z13.1 Hemoglobin A1c    4. Overweight  E66.3 Hemoglobin A1c    5. Vitamin D  deficiency  E55.9 VITAMIN D  25 Hydroxy (Vit-D Deficiency, Fractures)    6. Essential hypertension  I10 Comprehensive metabolic panel with GFR    CBC with Differential/Platelet    Lipid panel    7. Hypertriglyceridemia  E78.1 Comprehensive metabolic panel with GFR    CBC with Differential/Platelet    Lipid panel    8. Actinic keratosis  L57.0       No orders of the defined types were placed in this encounter.   Return precautions advised.  Clarisa Crooked, MD

## 2023-09-06 NOTE — Patient Instructions (Signed)
 X-ray today   Schedule a lab visit at the check out desk in about 2 weeks. Return for future fasting labs meaning nothing but water after midnight please. Ok to take your medications with water.   Sorry our cryotherapy tank is getting refilled and couldn't treat this today- would see dermatology in November unless worsens- get sooner appointment   Recommended follow up: Return in about 6 months (around 03/07/2024) for followup or sooner if needed.Schedule b4 you leave.

## 2023-09-07 NOTE — Progress Notes (Signed)
 [facility] Summary Report / Loop Recorder

## 2023-09-12 ENCOUNTER — Ambulatory Visit: Payer: Self-pay | Admitting: Family Medicine

## 2023-09-20 ENCOUNTER — Other Ambulatory Visit (INDEPENDENT_AMBULATORY_CARE_PROVIDER_SITE_OTHER)

## 2023-09-20 DIAGNOSIS — Z131 Encounter for screening for diabetes mellitus: Secondary | ICD-10-CM

## 2023-09-20 DIAGNOSIS — E559 Vitamin D deficiency, unspecified: Secondary | ICD-10-CM

## 2023-09-20 DIAGNOSIS — Z125 Encounter for screening for malignant neoplasm of prostate: Secondary | ICD-10-CM | POA: Diagnosis not present

## 2023-09-20 DIAGNOSIS — I10 Essential (primary) hypertension: Secondary | ICD-10-CM | POA: Diagnosis not present

## 2023-09-20 DIAGNOSIS — E663 Overweight: Secondary | ICD-10-CM | POA: Diagnosis not present

## 2023-09-20 DIAGNOSIS — E781 Pure hyperglyceridemia: Secondary | ICD-10-CM | POA: Diagnosis not present

## 2023-09-20 LAB — CBC WITH DIFFERENTIAL/PLATELET
Basophils Absolute: 0.1 10*3/uL (ref 0.0–0.1)
Basophils Relative: 0.8 % (ref 0.0–3.0)
Eosinophils Absolute: 0.2 10*3/uL (ref 0.0–0.7)
Eosinophils Relative: 3.7 % (ref 0.0–5.0)
HCT: 44.8 % (ref 39.0–52.0)
Hemoglobin: 15.4 g/dL (ref 13.0–17.0)
Lymphocytes Relative: 31.2 % (ref 12.0–46.0)
Lymphs Abs: 2.1 10*3/uL (ref 0.7–4.0)
MCHC: 34.3 g/dL (ref 30.0–36.0)
MCV: 93 fl (ref 78.0–100.0)
Monocytes Absolute: 0.7 10*3/uL (ref 0.1–1.0)
Monocytes Relative: 10.3 % (ref 3.0–12.0)
Neutro Abs: 3.6 10*3/uL (ref 1.4–7.7)
Neutrophils Relative %: 54 % (ref 43.0–77.0)
Platelets: 251 10*3/uL (ref 150.0–400.0)
RBC: 4.82 Mil/uL (ref 4.22–5.81)
RDW: 12 % (ref 11.5–15.5)
WBC: 6.6 10*3/uL (ref 4.0–10.5)

## 2023-09-20 LAB — LIPID PANEL
Cholesterol: 181 mg/dL (ref 0–200)
HDL: 55.3 mg/dL (ref >39.00)
LDL Cholesterol: 113 mg/dL — ABNORMAL HIGH (ref 0–99)
NonHDL: 125.86
Total CHOL/HDL Ratio: 3
Triglycerides: 63 mg/dL (ref 0.0–149.0)
VLDL: 12.6 mg/dL (ref 0.0–40.0)

## 2023-09-20 LAB — COMPREHENSIVE METABOLIC PANEL WITH GFR
ALT: 13 U/L (ref 0–53)
AST: 15 U/L (ref 0–37)
Albumin: 4.3 g/dL (ref 3.5–5.2)
Alkaline Phosphatase: 87 U/L (ref 39–117)
BUN: 16 mg/dL (ref 6–23)
CO2: 30 meq/L (ref 19–32)
Calcium: 9.6 mg/dL (ref 8.4–10.5)
Chloride: 100 meq/L (ref 96–112)
Creatinine, Ser: 1 mg/dL (ref 0.40–1.50)
GFR: 74.75 mL/min (ref >60.00)
Glucose, Bld: 107 mg/dL — ABNORMAL HIGH (ref 70–99)
Potassium: 4.1 meq/L (ref 3.5–5.1)
Sodium: 139 meq/L (ref 135–145)
Total Bilirubin: 0.8 mg/dL (ref 0.2–1.2)
Total Protein: 6.6 g/dL (ref 6.0–8.3)

## 2023-09-20 LAB — VITAMIN D 25 HYDROXY (VIT D DEFICIENCY, FRACTURES): VITD: 46.13 ng/mL (ref 30.00–100.00)

## 2023-09-20 LAB — HEMOGLOBIN A1C: Hgb A1c MFr Bld: 5.7 % (ref 4.6–6.5)

## 2023-09-20 LAB — PSA, MEDICARE: PSA: 0.4 ng/mL (ref 0.10–4.00)

## 2023-09-22 ENCOUNTER — Ambulatory Visit (INDEPENDENT_AMBULATORY_CARE_PROVIDER_SITE_OTHER)

## 2023-09-22 DIAGNOSIS — G459 Transient cerebral ischemic attack, unspecified: Secondary | ICD-10-CM | POA: Diagnosis not present

## 2023-09-22 LAB — CUP PACEART REMOTE DEVICE CHECK
Date Time Interrogation Session: 20250618232416
Implantable Pulse Generator Implant Date: 20240305

## 2023-09-23 ENCOUNTER — Ambulatory Visit: Payer: Self-pay | Admitting: Cardiology

## 2023-09-26 ENCOUNTER — Encounter

## 2023-10-13 NOTE — Progress Notes (Signed)
 [facility] Summary Report / Loop Recorder

## 2023-10-14 ENCOUNTER — Other Ambulatory Visit: Payer: Self-pay

## 2023-10-14 ENCOUNTER — Other Ambulatory Visit (HOSPITAL_BASED_OUTPATIENT_CLINIC_OR_DEPARTMENT_OTHER): Payer: Self-pay

## 2023-10-14 ENCOUNTER — Encounter: Payer: Self-pay | Admitting: Family Medicine

## 2023-10-14 MED ORDER — HYDROCODONE-ACETAMINOPHEN 5-325 MG PO TABS
0.5000 | ORAL_TABLET | Freq: Two times a day (BID) | ORAL | 0 refills | Status: DC | PRN
  Filled 2023-10-14 (×2): qty 55, 90d supply, fill #0

## 2023-10-24 ENCOUNTER — Ambulatory Visit

## 2023-10-24 DIAGNOSIS — G459 Transient cerebral ischemic attack, unspecified: Secondary | ICD-10-CM

## 2023-10-25 LAB — CUP PACEART REMOTE DEVICE CHECK
Date Time Interrogation Session: 20250720233425
Implantable Pulse Generator Implant Date: 20240305

## 2023-10-27 ENCOUNTER — Ambulatory Visit: Payer: Self-pay | Admitting: Cardiology

## 2023-10-31 ENCOUNTER — Other Ambulatory Visit: Payer: Self-pay

## 2023-10-31 ENCOUNTER — Encounter

## 2023-11-21 NOTE — Progress Notes (Signed)
 [facility] Summary Report / Loop Recorder

## 2023-11-24 ENCOUNTER — Ambulatory Visit (INDEPENDENT_AMBULATORY_CARE_PROVIDER_SITE_OTHER)

## 2023-11-24 DIAGNOSIS — G459 Transient cerebral ischemic attack, unspecified: Secondary | ICD-10-CM | POA: Diagnosis not present

## 2023-11-24 LAB — CUP PACEART REMOTE DEVICE CHECK
Date Time Interrogation Session: 20250820234234
Implantable Pulse Generator Implant Date: 20240305

## 2023-11-25 ENCOUNTER — Ambulatory Visit: Payer: Self-pay | Admitting: Cardiology

## 2023-12-06 ENCOUNTER — Encounter

## 2023-12-16 ENCOUNTER — Ambulatory Visit: Payer: Self-pay | Admitting: Family Medicine

## 2023-12-16 ENCOUNTER — Ambulatory Visit (INDEPENDENT_AMBULATORY_CARE_PROVIDER_SITE_OTHER): Admitting: Family Medicine

## 2023-12-16 ENCOUNTER — Encounter: Payer: Self-pay | Admitting: Family Medicine

## 2023-12-16 VITALS — BP 110/62 | HR 69 | Temp 97.2°F | Ht 68.0 in | Wt 182.4 lb

## 2023-12-16 DIAGNOSIS — Z23 Encounter for immunization: Secondary | ICD-10-CM

## 2023-12-16 DIAGNOSIS — E785 Hyperlipidemia, unspecified: Secondary | ICD-10-CM

## 2023-12-16 DIAGNOSIS — Z79899 Other long term (current) drug therapy: Secondary | ICD-10-CM | POA: Diagnosis not present

## 2023-12-16 DIAGNOSIS — E781 Pure hyperglyceridemia: Secondary | ICD-10-CM | POA: Diagnosis not present

## 2023-12-16 DIAGNOSIS — E559 Vitamin D deficiency, unspecified: Secondary | ICD-10-CM | POA: Diagnosis not present

## 2023-12-16 DIAGNOSIS — F411 Generalized anxiety disorder: Secondary | ICD-10-CM

## 2023-12-16 DIAGNOSIS — I10 Essential (primary) hypertension: Secondary | ICD-10-CM

## 2023-12-16 DIAGNOSIS — G609 Hereditary and idiopathic neuropathy, unspecified: Secondary | ICD-10-CM

## 2023-12-16 LAB — CBC WITH DIFFERENTIAL/PLATELET
Basophils Absolute: 0.1 K/uL (ref 0.0–0.1)
Basophils Relative: 0.7 % (ref 0.0–3.0)
Eosinophils Absolute: 0.3 K/uL (ref 0.0–0.7)
Eosinophils Relative: 3.8 % (ref 0.0–5.0)
HCT: 45 % (ref 39.0–52.0)
Hemoglobin: 15.5 g/dL (ref 13.0–17.0)
Lymphocytes Relative: 25.1 % (ref 12.0–46.0)
Lymphs Abs: 1.7 K/uL (ref 0.7–4.0)
MCHC: 34.4 g/dL (ref 30.0–36.0)
MCV: 94.1 fl (ref 78.0–100.0)
Monocytes Absolute: 0.6 K/uL (ref 0.1–1.0)
Monocytes Relative: 9.4 % (ref 3.0–12.0)
Neutro Abs: 4.2 K/uL (ref 1.4–7.7)
Neutrophils Relative %: 61 % (ref 43.0–77.0)
Platelets: 226 K/uL (ref 150.0–400.0)
RBC: 4.78 Mil/uL (ref 4.22–5.81)
RDW: 12.4 % (ref 11.5–15.5)
WBC: 6.9 K/uL (ref 4.0–10.5)

## 2023-12-16 LAB — COMPREHENSIVE METABOLIC PANEL WITH GFR
ALT: 20 U/L (ref 0–53)
AST: 20 U/L (ref 0–37)
Albumin: 4.4 g/dL (ref 3.5–5.2)
Alkaline Phosphatase: 81 U/L (ref 39–117)
BUN: 18 mg/dL (ref 6–23)
CO2: 29 meq/L (ref 19–32)
Calcium: 9.5 mg/dL (ref 8.4–10.5)
Chloride: 103 meq/L (ref 96–112)
Creatinine, Ser: 0.99 mg/dL (ref 0.40–1.50)
GFR: 75.54 mL/min (ref >60.00)
Glucose, Bld: 100 mg/dL — ABNORMAL HIGH (ref 70–99)
Potassium: 4.1 meq/L (ref 3.5–5.1)
Sodium: 139 meq/L (ref 135–145)
Total Bilirubin: 0.9 mg/dL (ref 0.2–1.2)
Total Protein: 6.5 g/dL (ref 6.0–8.3)

## 2023-12-16 LAB — VITAMIN D 25 HYDROXY (VIT D DEFICIENCY, FRACTURES): VITD: 58.79 ng/mL (ref 30.00–100.00)

## 2023-12-16 LAB — VITAMIN B12: Vitamin B-12: 602 pg/mL (ref 211–911)

## 2023-12-16 LAB — LIPID PANEL
Cholesterol: 126 mg/dL (ref 0–200)
HDL: 56.2 mg/dL (ref >39.00)
LDL Cholesterol: 55 mg/dL (ref 0–99)
NonHDL: 70.11
Total CHOL/HDL Ratio: 2
Triglycerides: 76 mg/dL (ref 0.0–149.0)
VLDL: 15.2 mg/dL (ref 0.0–40.0)

## 2023-12-16 NOTE — Patient Instructions (Addendum)
Please stop by lab before you go If you have mychart- we will send your results within 3 business days of us receiving them.  If you do not have mychart- we will call you about results within 5 business days of us receiving them.  *please also note that you will see labs on mychart as soon as they post. I will later go in and write notes on them- will say "notes from Dr. Barnard Sharps"   Recommended follow up: Return for next already scheduled visit or sooner if needed. 

## 2023-12-16 NOTE — Progress Notes (Signed)
 Phone [phone] In person visit   Subjective:   [name] Page is a 73 y.o. year old very pleasant male patient who presents for/with See problem oriented charting Chief Complaint  Patient presents with   Hyperlipidemia   Medical Management of Chronic Issues    Would like ear checked again;    Past Medical History-  Patient Active Problem List   Diagnosis Date Noted   Chronic pain syndrome 08/31/2016    Priority: High   History of lung cancer 12/11/2012    Priority: High   Vitamin D  deficiency 02/11/2022    Priority: Medium    Aortic atherosclerosis (HCC) 11/03/2020    Priority: Medium    History of adenomatous polyp of colon 03/24/2018    Priority: Medium    Generalized anxiety disorder 03/12/2014    Priority: Medium    Essential hypertension 03/12/2014    Priority: Medium    Peripheral neuropathy, idiopathic 12/22/2012    Priority: Medium    Dyspnea 07/14/2012    Priority: Medium    Hypertriglyceridemia 07/14/2012    Priority: Medium    Depression 02/16/2010    Priority: Medium    Low testosterone  11/21/2007    Priority: Medium    Chronic bilateral low back pain 06/05/2019    Priority: Low   Venous stasis of both lower extremities 06/05/2019    Priority: Low   Mastoiditis 03/09/2017    Priority: Low   Palpitations 08/11/2016    Priority: Low   Eustachian tube dysfunction 12/17/2014    Priority: Low   SI (sacroiliac) joint dysfunction 09/19/2013    Priority: Low   Nonspecific abnormal electrocardiogram (ECG) (EKG) 07/14/2012    Priority: Low   Allergic reaction, history of 10/25/2011    Priority: Low   Rectal or anal pain 10/01/2010    Priority: Low   Allergic rhinitis 06/13/2007    Priority: Low   GERD 06/13/2007    Priority: Low   IBS (irritable bowel syndrome) 06/13/2007    Priority: Low   Lumbar trigger point syndrome 01/11/2023    Priority: 1.   Nonallopathic lesion of sacral region 09/19/2013    Priority: 1.   Nonallopathic lesion of  thoracic region 09/19/2013    Priority: 1.   Nonallopathic lesion of lumbosacral region 09/19/2013    Priority: 1.   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     Ascorbic Acid (VITAMIN C) 1000 MG tablet      aspirin  EC 325 MG tablet Take 325 mg by mouth daily.     buPROPion  (WELLBUTRIN  XL) 300 MG 24 hr tablet Take 1 tablet (300 mg total) by mouth daily. 90 tablet 3   Cholecalciferol (VITAMIN D ) 2000 UNITS CAPS Take 1 capsule by mouth daily.     clonazePAM  (KLONOPIN ) 0.5 MG tablet Take 1 tablet (0.5 mg total) by mouth 2 (two) times daily as needed for anxiety (do not drive for 12 hours after taking). 180 tablet 1   Cyanocobalamin  (B-12) 2500 MCG TABS Take by mouth.     diltiazem  (CARDIZEM  CD) 180 MG 24 hr capsule TAKE 1 CAPSULE BY MOUTH DAILY 100 capsule 2   gabapentin  (NEURONTIN ) 300 MG capsule TAKE 3 CAPSULES BY MOUTH AT  BEDTIME 300 capsule 2   hydrochlorothiazide  (HYDRODIURIL ) 25 MG tablet Take 1 tablet (25 mg total) by mouth daily. 90 tablet 3  HYDROcodone -acetaminophen  (NORCO/VICODIN) 5-325 MG tablet Take 0.5-1 tablets by mouth 2 (two) times daily as needed. Do not use more than 4 times a week. This is intended as a 3 month supply. 55 tablet 0   methylcellulose (CITRUCEL) oral powder Use as directed once daily     potassium chloride  (KLOR-CON ) 10 MEQ tablet TAKE 3 TABLETS BY MOUTH IN THE  MORNING AND 2 TABLETS BY MOUTH  IN THE AFTERNOON 500 tablet 2   rosuvastatin  (CRESTOR ) 5 MG tablet Take 1 tablet (5 mg total) by mouth daily. 90 tablet 3   No current facility-administered medications for this visit.     Objective:  BP 110/62 (BP Location: Left Arm, Patient Position: Sitting, Cuff Size: Normal)   Pulse 69   Temp (!) 97.2 F (36.2 C) (Temporal)   Ht 5' 8 (1.727 m)   Wt 182 lb 6.4 oz (82.7 kg)   SpO2 96%   BMI  27.73 kg/m  Gen: NAD, resting comfortably Skin: warm, dry, right ear slightly scaly 2-3 mm patch    Assessment and Plan     #possible AK on right ear- sees dermatology in November and will discuss there  #History of lung cancer/actually carcinoid tumor malignant-we follow an annual chest x-ray on him.  Last chest x-ray 09/06/23-reassuring.  Also prior with CT chest abdomen pelvis 03/05/2020   #Chronic pain syndrome/neuropathy S: Medication: Gabapentin  600- 900 mg at bedtime as occasionally affects balance higher dose, sparing hydrocodone  3-4x a week.  He knows to space hydrocodone  and clonazepam  at least 12 hours  -Does stationary bike for exercise and treadmill and some weights  A/P: reasonable but imperfect control- continue current medications   -PDMP reviewed and low risk  -UDS last updated July 2024- update today -Controlled substance contract on file from 07/08/2017  #hypertension/hypokalemia requiring potassium 20 mEq twice daily S: medication: hctz 25 mg, diltiazem  180 mg extended release (amlodipine  caused edema in past).  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 Lonni Adie with left visual field disturbance in July 2024)-plans on loop recorder with Dr. Cindie #microvascular ischemia on prior MRI # Hyperlipidemia/hypertriglyceridemia. LPA not elevated # Aortic atherosclerosis-prior coronary artery calcium  score of 0 on 07/08/2020 S: Medication:Atorvastatin  40 mg--> 10mg  (patient concern about neuropathy)--> rosuvastatin  5 mg, asa 325 mg Lab Results  Component Value Date   CHOL 181 09/20/2023   HDL 55.30 09/20/2023   LDLCALC 113 (H) 09/20/2023   LDLDIRECT 57.0  10/27/2022   TRIG 63.0 09/20/2023   CHOLHDL 3 09/20/2023   A/P: lipids hopefully improved with LDL under 70 again back on statin. Continue aspirin  as well with history BRAo and avoid COVID vaccine with prior proximity to that and BRAO. Got flu shot ttoday though.   # Mastoid effusion on prior neuroimaging-trial of prednisone  and had recommended ENT  # Depression/GAD S: Medication:Wellbutrin  300 mg XL .  Uses clonazepam  1-2 times a day-we have discussed trying to cut back.  Has not tolerated SSRIs due to palpitations    12/16/2023    9:01 AM 09/06/2023    9:59 AM 07/06/2023   11:37 AM  Depression screen PHQ 2/9  Decreased Interest 0 0 0  Down, Depressed, Hopeless 0 0 1  PHQ -  2 Score 0 0 1  Altered sleeping 1 0   Tired, decreased energy 1 0   Change in appetite 0 0   Feeling bad or failure about yourself  0 0   Trouble concentrating 0 0   Moving slowly or fidgety/restless 0 0   Suicidal thoughts 0 0   PHQ-9 Score 2 0   Difficult doing work/chores Not difficult at all Not difficult at all        12/16/2023    9:02 AM 02/28/2023   10:18 AM 10/27/2022   11:00 AM 06/16/2022   10:11 AM  GAD 7 : Generalized Anxiety Score  Nervous, Anxious, on Edge 2 1 2 1   Control/stop worrying 2 0 1 0  Worry too much - different things 2 1 1  0  Trouble relaxing 0 0 0 0  Restless 0 0 0 0  Easily annoyed or irritable 0 0 0 0  Afraid - awful might happen 0 0 1 1  Total GAD 7 Score 6 2 5 2   Anxiety Difficulty Not difficult at all Not difficult at all Somewhat difficult Somewhat difficult  A/P: depression full remission. Anxiety worse in evenings and pretty consistently takes clonazepam  in evening now  Recommended follow up: Return for next already scheduled visit or sooner if needed. Future Appointments  Date Time Provider Department Center  12/26/2023  7:30 AM [facility] DEVICE REMOTES [facility]  01/26/2024  7:30 AM [facility] DEVICE REMOTES [facility]  02/16/2024  8:00 AM [facility]NEUR[facility] LAB  [facility] None  02/22/2024 10:00 AM Thapa, [place], MD [facility] None  02/27/2024  7:30 AM [facility] DEVICE REMOTES [facility]  03/14/2024 10:20 AM Katrinka Garnette KIDD, MD [facility]    Lab/Order associations:   ICD-10-CM   1. Essential hypertension  I10 Comprehensive metabolic panel with GFR    CBC with Differential/Platelet    2. Hypertriglyceridemia  E78.1     3. Peripheral neuropathy, idiopathic  G60.9 Vitamin B12    4. Generalized anxiety disorder  F41.1     5. Vitamin D  deficiency  E55.9 VITAMIN D  25 Hydroxy (Vit-D Deficiency, Fractures)    6. Hyperlipidemia, unspecified hyperlipidemia type  E78.5 Lipid panel    7. High risk medication use  Z79.899 DRUG MONITORING, PANEL 8 WITH CONFIRMATION, URINE    8. Need for influenza vaccination  Z23 Flu vaccine HIGH DOSE PF(Fluzone Trivalent)      No orders of the defined types were placed in this encounter.   Return precautions advised.  Garnette Katrinka, MD

## 2023-12-21 LAB — DRUG MONITORING, PANEL 8 WITH CONFIRMATION, URINE
6 Acetylmorphine: NEGATIVE ng/mL (ref <10)
Alcohol Metabolites: NEGATIVE ng/mL (ref <500)
Amphetamines: NEGATIVE ng/mL (ref <500)
Benzodiazepines: NEGATIVE ng/mL (ref <100)
Buprenorphine, Urine: NEGATIVE ng/mL (ref <5)
Cocaine Metabolite: NEGATIVE ng/mL (ref <150)
Codeine: NEGATIVE ng/mL (ref <50)
Creatinine: 59.7 mg/dL (ref >=20.0)
Hydrocodone: 452 ng/mL — ABNORMAL HIGH (ref <50)
Hydromorphone: NEGATIVE ng/mL (ref <50)
MDMA: NEGATIVE ng/mL (ref <500)
Marijuana Metabolite: NEGATIVE ng/mL (ref <20)
Morphine: NEGATIVE ng/mL (ref <50)
Norhydrocodone: 211 ng/mL — ABNORMAL HIGH (ref <50)
Opiates: POSITIVE ng/mL — AB (ref <100)
Oxidant: NEGATIVE ug/mL (ref <200)
Oxycodone: NEGATIVE ng/mL (ref <100)
pH: 5.3 (ref 4.5–9.0)

## 2023-12-21 LAB — DM TEMPLATE

## 2023-12-26 ENCOUNTER — Ambulatory Visit (INDEPENDENT_AMBULATORY_CARE_PROVIDER_SITE_OTHER)

## 2023-12-26 DIAGNOSIS — G459 Transient cerebral ischemic attack, unspecified: Secondary | ICD-10-CM | POA: Diagnosis not present

## 2023-12-26 LAB — CUP PACEART REMOTE DEVICE CHECK
Date Time Interrogation Session: 20250921232534
Implantable Pulse Generator Implant Date: 20240305

## 2023-12-27 NOTE — Progress Notes (Signed)
 Remote Loop Recorder Transmission

## 2023-12-28 ENCOUNTER — Ambulatory Visit: Payer: Self-pay | Admitting: Cardiology

## 2023-12-28 NOTE — Progress Notes (Signed)
 Remote Loop Recorder Transmission

## 2023-12-29 ENCOUNTER — Other Ambulatory Visit (HOSPITAL_BASED_OUTPATIENT_CLINIC_OR_DEPARTMENT_OTHER): Payer: Self-pay

## 2023-12-29 DIAGNOSIS — D0421 Carcinoma in situ of skin of right ear and external auricular canal: Secondary | ICD-10-CM | POA: Diagnosis not present

## 2023-12-29 DIAGNOSIS — D485 Neoplasm of uncertain behavior of skin: Secondary | ICD-10-CM | POA: Diagnosis not present

## 2023-12-29 MED ORDER — BETAMETHASONE DIPROPIONATE 0.05 % EX OINT
TOPICAL_OINTMENT | CUTANEOUS | 0 refills | Status: AC
  Filled 2023-12-29: qty 45, 30d supply, fill #0

## 2024-01-01 ENCOUNTER — Encounter: Payer: Self-pay | Admitting: Family Medicine

## 2024-01-02 ENCOUNTER — Other Ambulatory Visit (HOSPITAL_BASED_OUTPATIENT_CLINIC_OR_DEPARTMENT_OTHER): Payer: Self-pay

## 2024-01-02 MED ORDER — HYDROCODONE-ACETAMINOPHEN 5-325 MG PO TABS
0.5000 | ORAL_TABLET | Freq: Two times a day (BID) | ORAL | 0 refills | Status: DC | PRN
  Filled 2024-01-02 – 2024-01-04 (×2): qty 55, 28d supply, fill #0

## 2024-01-03 NOTE — Telephone Encounter (Signed)
 Left message for pharmacy team to allow early pick up for medication.

## 2024-01-04 ENCOUNTER — Other Ambulatory Visit (HOSPITAL_BASED_OUTPATIENT_CLINIC_OR_DEPARTMENT_OTHER): Payer: Self-pay

## 2024-01-04 ENCOUNTER — Other Ambulatory Visit: Payer: Self-pay

## 2024-01-05 ENCOUNTER — Other Ambulatory Visit (HOSPITAL_BASED_OUTPATIENT_CLINIC_OR_DEPARTMENT_OTHER): Payer: Self-pay

## 2024-01-05 MED ORDER — FLUOROURACIL 5 % EX CREA
TOPICAL_CREAM | Freq: Every day | CUTANEOUS | 0 refills | Status: AC
  Filled 2024-01-05 – 2024-01-12 (×3): qty 40, 30d supply, fill #0

## 2024-01-06 ENCOUNTER — Other Ambulatory Visit (HOSPITAL_BASED_OUTPATIENT_CLINIC_OR_DEPARTMENT_OTHER): Payer: Self-pay

## 2024-01-07 ENCOUNTER — Other Ambulatory Visit (HOSPITAL_BASED_OUTPATIENT_CLINIC_OR_DEPARTMENT_OTHER): Payer: Self-pay

## 2024-01-09 ENCOUNTER — Encounter

## 2024-01-09 NOTE — Progress Notes (Signed)
 Remote Loop Recorder Transmission

## 2024-01-11 ENCOUNTER — Other Ambulatory Visit (HOSPITAL_BASED_OUTPATIENT_CLINIC_OR_DEPARTMENT_OTHER): Payer: Self-pay

## 2024-01-12 ENCOUNTER — Other Ambulatory Visit (HOSPITAL_BASED_OUTPATIENT_CLINIC_OR_DEPARTMENT_OTHER): Payer: Self-pay

## 2024-01-17 ENCOUNTER — Other Ambulatory Visit (HOSPITAL_BASED_OUTPATIENT_CLINIC_OR_DEPARTMENT_OTHER): Payer: Self-pay

## 2024-01-25 ENCOUNTER — Other Ambulatory Visit (HOSPITAL_BASED_OUTPATIENT_CLINIC_OR_DEPARTMENT_OTHER): Payer: Self-pay

## 2024-01-25 ENCOUNTER — Ambulatory Visit: Admitting: Family Medicine

## 2024-01-25 VITALS — BP 124/66 | HR 75 | Ht 68.0 in | Wt 183.0 lb

## 2024-01-25 DIAGNOSIS — G8929 Other chronic pain: Secondary | ICD-10-CM

## 2024-01-25 DIAGNOSIS — M549 Dorsalgia, unspecified: Secondary | ICD-10-CM

## 2024-01-25 DIAGNOSIS — M5442 Lumbago with sciatica, left side: Secondary | ICD-10-CM | POA: Diagnosis not present

## 2024-01-25 DIAGNOSIS — G609 Hereditary and idiopathic neuropathy, unspecified: Secondary | ICD-10-CM | POA: Diagnosis not present

## 2024-01-25 MED ORDER — PREDNISONE 20 MG PO TABS
40.0000 mg | ORAL_TABLET | Freq: Every day | ORAL | 0 refills | Status: DC
  Filled 2024-01-25: qty 10, 5d supply, fill #0

## 2024-01-25 MED ORDER — METHYLPREDNISOLONE ACETATE 80 MG/ML IJ SUSP
80.0000 mg | Freq: Once | INTRAMUSCULAR | Status: AC
  Administered 2024-01-25: 80 mg via INTRAMUSCULAR

## 2024-01-25 NOTE — Assessment & Plan Note (Addendum)
 Discussed with patient at great length, discussed icing regimen and home exercises, increase activity slowly.  Depo-Medrol  injection given today, discussed the possibility of prednisone  for 5 days.  Discussed vibrating plate for this individual to help with potentially some recruitment of nerve fibers for his proprioception in his peripheral neuropathy.  Increase activity slowly.  Follow-up again in 6 to 8 weeks we will get x-rays to further evaluate for any other bony abnormality with patient having a past medical history significant for lung cancer.

## 2024-01-25 NOTE — Progress Notes (Signed)
 [name] [address] [place] [address] Phone: [phone] Subjective:   ISusannah Page, am serving as a scribe for Dr. Arthea Claudene.  I'm seeing this patient by the request  of:  Katrinka Garnette KIDD, MD  CC: Multiple joint complaints  YEP:Dlagzrupcz  [name] Page is a 73 y.o. male coming in with complaint of back pain. Started off as back pain. Had a flare up a couple of weeks ago. Pain settled in L hip and sometimes radiates down leg. Endo f the day worse. Also hurts when sitting for too long. Location of pain travels.     Seen primary care provider in the last 2 months and laboratory workup was unremarkable Previous musculoskeletal imaging includes an MRI of the lumbar spine in November 2024 showing significant multifactorial arthritic changes noted of the lumbar spine. 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   Cataract    forming   Depressive disorder, not elsewhere classified    Diverticulitis    X 1 EPISODE   Esophageal reflux    past hx    HTN (hypertension)    Hyperlipidemia    CONTROLLED   Lymphocytic colitis    Neuromuscular disorder (HCC)    neuropathy   Neuropathy    Other testicular hypofunction    Palpitations    Personal history of urinary calculi    LAST IN THE 80'S   Stroke [facility]) April 13, 2022   Past Surgical History:  Procedure Laterality Date   CARDIAC CATHETERIZATION     COLONOSCOPY     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   [name]   parotid tumor surgery     [place]- [place]IGN   POLYPECTOMY     TONSILLECTOMY AND ADENOIDECTOMY     Social History   Socioeconomic History   Marital status: Married    Spouse name: Not on file   Number  of children: 0   Years of education: 18   Highest education level: Bachelor's degree (e.g., BA, AB, BS)  Occupational History   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 Married 30 + years. No children, lots of critters.       Cancer Survivor- carcinoid lung cancer.   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 Santina back to work for ability to be insured.       Hobbies: music, home repair/improvement, cats  Social Drivers of Corporate investment banker Strain: Low Risk  (12/15/2023)   Overall Financial Resource Strain (CARDIA)    Difficulty of Paying Living Expenses: Not hard at all  Food Insecurity: No Food Insecurity (12/15/2023)   Hunger Vital Sign    Worried About Running Out of Food in the Last Year: Never true    Ran Out of Food in the Last Year: Never true  Transportation Needs: No Transportation Needs (12/15/2023)   PRAPARE - Administrator, Civil Service (Medical): No    Lack of Transportation (Non-Medical): No  Physical Activity: Sufficiently Active (12/15/2023)   Exercise Vital Sign    Days of Exercise per Week: 5 days    Minutes of Exercise per Session: 40 min  Stress: No Stress Concern Present (12/15/2023)   [employer] of Occupational Health - Occupational Stress Questionnaire    Feeling of Stress: Only a little  Social Connections: Socially Isolated  (12/15/2023)   Social Connection and Isolation Panel    Frequency of Communication with Friends and Family: Once a week    Frequency of Social Gatherings with Friends and Family: Once a week    Attends Religious Services: Never    Database administrator or Organizations: No    Attends Engineer, structural: Not on file    Marital Status: Married   Allergies  Allergen Reactions   Bee Venom Anaphylaxis   Colestipol  Diarrhea and Other (See Comments)   Epinephrine  Other (See Comments)    Per patient request to remove this. States not an allergy.    Flagyl [Metronidazole Hcl] Other (See Comments)    Neuropathy    Ibuprofen Other (See Comments)   Nsaids     REACTION: intolereance- ABD PAIN AND DIARRHEA    Protonix [Pantoprazole]     Abdominal Pain and Diarrhea   Family History  Problem Relation Age of Onset   Hypertension Mother    Anxiety disorder Mother    Non-Hodgkin's lymphoma Mother    Cancer Mother    Heart disease Mother    Hyperlipidemia Mother    Varicose Veins Mother    Dementia Father    Depression Father        and anxiety   Hypertension Father    Hearing loss Father    Obesity Father    Sudden death Paternal Uncle 51   Colon cancer Paternal Uncle    Hypertension Brother    Obesity Brother    Diabetes Maternal Grandmother    Obesity Maternal Grandmother    Diabetes Paternal Grandmother    Obesity Paternal Grandmother    Early death Neg Hx    Colon polyps Neg Hx    Esophageal cancer Neg Hx    Rectal cancer Neg Hx    Stomach cancer Neg Hx     Current Outpatient Medications (Endocrine & Metabolic):    predniSONE  (DELTASONE ) 20 MG tablet, Take 2 tablets (40 mg total) by mouth daily with breakfast.  Current Outpatient Medications (Cardiovascular):    diltiazem  (CARDIZEM  CD) 180 MG 24 hr capsule, TAKE 1 CAPSULE BY MOUTH DAILY   hydrochlorothiazide  (HYDRODIURIL ) 25 MG tablet, Take 1 tablet (25 mg total) by mouth daily.   rosuvastatin  (CRESTOR ) 5 MG  tablet, Take 1 tablet (5 mg total) by mouth daily.   Current Outpatient Medications (Analgesics):    aspirin  EC 325 MG tablet, Take 325 mg by mouth daily.   HYDROcodone -acetaminophen  (NORCO/VICODIN) 5-325 MG tablet, Take 0.5-1 tablets by mouth 2 (two)  times daily as needed. Do not use more than 4 times a week. This is intended as a 3 month supply.  Current Outpatient Medications (Hematological):    Cyanocobalamin  (B-12) 2500 MCG TABS, Take by mouth.  Current Outpatient Medications (Other):    Acetylcarnitine HCl (ACETYL L-CARNITINE PO)*, Take 500 mg by mouth.   Ascorbic Acid (VITAMIN C) 1000 MG tablet,    betamethasone  dipropionate (DIPROLENE ) 0.05 % ointment, Apply 1 application Externally Once a day   buPROPion  (WELLBUTRIN  XL) 300 MG 24 hr tablet, Take 1 tablet (300 mg total) by mouth daily.   Cholecalciferol (VITAMIN D ) 2000 UNITS CAPS, Take 1 capsule by mouth daily.   clonazePAM  (KLONOPIN ) 0.5 MG tablet, Take 1 tablet (0.5 mg total) by mouth 2 (two) times daily as needed for anxiety (do not drive for 12 hours after taking).   fluorouracil (EFUDEX) 5 % cream, Apply to skin cancer on right ear topically once daily. Hold for severe inflammation.   gabapentin  (NEURONTIN ) 300 MG capsule, TAKE 3 CAPSULES BY MOUTH AT  BEDTIME   methylcellulose (CITRUCEL) oral powder, Use as directed once daily   potassium chloride  (KLOR-CON ) 10 MEQ tablet, TAKE 3 TABLETS BY MOUTH IN THE  MORNING AND 2 TABLETS BY MOUTH  IN THE AFTERNOON * These medications belong to multiple therapeutic classes and are listed under each applicable group.   Reviewed prior external information including notes and imaging from  primary care provider As well as notes that were available from care everywhere and other healthcare systems.  Past medical history, social, surgical and family history all reviewed in electronic medical record.  No pertanent information unless stated regarding to the chief complaint.   Review of  Systems:  No headache, visual changes, nausea, vomiting, diarrhea, constipation, dizziness, abdominal pain, skin rash, fevers, chills, night sweats, weight loss, swollen lymph nodes, body aches, joint swelling, chest pain, shortness of breath, mood changes. POSITIVE muscle aches  Objective  Blood pressure 124/66, pulse 75, height 5' 8 (1.727 m), weight 183 lb (83 kg), SpO2 97%.   General: No apparent distress alert and oriented x3 mood and affect normal, dressed appropriately.  HEENT: Pupils equal, extraocular movements intact  Respiratory: Patient's speak in full sentences and does not appear short of breath  Cardiovascular: No lower extremity edema, non tender, no erythema   Low back exam shows mild lordosis.  Still has the neuropathy noted.  The patient does not have a positive straight leg test noted at the moment.  Neurovascular intact and deep tendon reflexes intact.  The sensation he has in the lower extremity is symmetric bilaterally.   Impression and Recommendations:  Chronic bilateral low back pain Discussed with patient at great length, discussed icing regimen and home exercises, increase activity slowly.  Depo-Medrol  injection given today, discussed the possibility of prednisone  for 5 days.  Discussed vibrating plate for this individual to help with potentially some recruitment of nerve fibers for his proprioception in his peripheral neuropathy.  Increase activity slowly.  Follow-up again in 6 to 8 weeks we will get x-rays to further evaluate for any other bony abnormality with patient having a past medical history significant for lung cancer.    The above documentation has been reviewed and is accurate and complete Chermaine Schnyder M Charise Leinbach, DO

## 2024-01-25 NOTE — Patient Instructions (Addendum)
 Prednisone  40mg  for 5 days start tomorrow Do prescribed exercises at least 3x a week No post digger until Nov Vibrating plate See you again in 2 months

## 2024-01-26 ENCOUNTER — Ambulatory Visit

## 2024-01-26 ENCOUNTER — Encounter

## 2024-01-26 DIAGNOSIS — G459 Transient cerebral ischemic attack, unspecified: Secondary | ICD-10-CM

## 2024-01-26 LAB — CUP PACEART REMOTE DEVICE CHECK
Date Time Interrogation Session: 20251022232157
Implantable Pulse Generator Implant Date: 20240305

## 2024-01-27 NOTE — Progress Notes (Signed)
 Remote Loop Recorder Transmission

## 2024-01-30 ENCOUNTER — Ambulatory Visit: Payer: Self-pay | Admitting: Cardiology

## 2024-02-01 ENCOUNTER — Other Ambulatory Visit: Payer: Self-pay | Admitting: Family Medicine

## 2024-02-13 ENCOUNTER — Encounter

## 2024-02-16 ENCOUNTER — Other Ambulatory Visit

## 2024-02-16 ENCOUNTER — Other Ambulatory Visit: Payer: Self-pay | Admitting: Endocrinology

## 2024-02-16 DIAGNOSIS — R7301 Impaired fasting glucose: Secondary | ICD-10-CM

## 2024-02-17 LAB — HEMOGLOBIN A1C
Hgb A1c MFr Bld: 5.6 % (ref <5.7)
Mean Plasma Glucose: 114 mg/dL
eAG (mmol/L): 6.3 mmol/L

## 2024-02-17 LAB — VITAMIN D 25 HYDROXY (VIT D DEFICIENCY, FRACTURES): Vit D, 25-Hydroxy: 58 ng/mL (ref 30–100)

## 2024-02-17 LAB — GLUCOSE, FASTING: Glucose, Bld: 105 mg/dL — ABNORMAL HIGH (ref 65–99)

## 2024-02-21 LAB — CARDIOLIPIN ANTIBODIES, IGG, IGM, IGA
Anticardiolipin IgA: <2 [APL'U]/mL (ref <20.0)
Anticardiolipin IgG: <2 [GPL'U]/mL (ref <20.0)
Anticardiolipin IgM: 15.6 [MPL'U]/mL (ref <20.0)

## 2024-02-21 LAB — TESTOSTERONE, FREE & TOTAL
Free Testosterone: 31.7 pg/mL (ref 30.0–135.0)
Testosterone, Total, LC-MS-MS: 219 ng/dL — ABNORMAL LOW (ref 250–1100)

## 2024-02-22 ENCOUNTER — Encounter: Payer: Self-pay | Admitting: Endocrinology

## 2024-02-22 ENCOUNTER — Ambulatory Visit: Admitting: Endocrinology

## 2024-02-22 ENCOUNTER — Ambulatory Visit: Payer: Self-pay | Admitting: Endocrinology

## 2024-02-22 VITALS — BP 140/80 | HR 59 | Resp 20 | Ht 68.0 in | Wt 181.2 lb

## 2024-02-22 DIAGNOSIS — E559 Vitamin D deficiency, unspecified: Secondary | ICD-10-CM

## 2024-02-22 DIAGNOSIS — E291 Testicular hypofunction: Secondary | ICD-10-CM

## 2024-02-22 DIAGNOSIS — R7301 Impaired fasting glucose: Secondary | ICD-10-CM

## 2024-02-22 LAB — TESTOSTERONE, TOTAL, LC/MS/MS: Testosterone, Total, LC-MS-MS: 232 ng/dL — ABNORMAL LOW (ref 250–1100)

## 2024-02-22 LAB — FOLLICLE STIMULATING HORMONE: FSH: 24.1 m[IU]/mL — ABNORMAL HIGH (ref 1.4–12.8)

## 2024-02-22 LAB — LUTEINIZING HORMONE: LH: 8.7 m[IU]/mL (ref 1.6–15.2)

## 2024-02-22 LAB — TESTOSTERONE, FREE: TESTOSTERONE FREE: 24.1 pg/mL (ref 6.0–73.0)

## 2024-02-22 NOTE — Patient Instructions (Signed)
 Testosterone Capsules What is this medication? TESTOSTERONE (tes TOS ter one) is used to increase testosterone levels in your body. It belongs to a group of medications called androgen hormones. This medicine may be used for other purposes; ask your health care provider or pharmacist if you have questions. COMMON BRAND NAME(S): [place], Undecatrex What should I tell my care team before I take this medication? They need to know if you have any of these conditions: Cancer Depression Diabetes Heart disease High blood pressure High red blood cell levels Kidney disease Liver disease Lung or breathing disease Prostate disease Suicidal thoughts, plans, or attempt by you or a family member An unusual or allergic reaction to testosterone, other medications, foods, dyes, or preservatives If you or your partner are pregnant or trying to get pregnant Breastfeeding How should I use this medication? Take this medication by mouth with a glass of water. Take it as directed on the prescription label at the same time every day. Take it with food. Do not take your medication more often than directed. Keep taking it unless your care team tells you to stop. A special MedGuide will be given to you by the pharmacist with each prescription and refill. Be sure to read this information carefully each time. Talk to your care team about the use of this medication in children. Special care may be needed. Overdosage: If you think you have taken too much of this medicine contact a poison control center or emergency room at once. NOTE: This medicine is only for you. Do not share this medicine with others. What if I miss a dose? If you miss a dose, take it as soon as you can. If it is almost time for your next dose, take only that dose. Do not take double or extra doses. Call your care team if you are not sure how to handle a missed dose. What may interact with this medication? Certain medications for colds  or congestion, like ephedrine, phenylephrine, or pseudoephedrine Certain medications that treat or prevent blood clots like warfarin Medications for diabetes Steroid medications like prednisone or cortisone This list may not describe all possible interactions. Give your health care provider a list of all the medicines, herbs, non-prescription drugs, or dietary supplements you use. Also tell them if you smoke, drink alcohol, or use illegal drugs. Some items may interact with your medicine. What should I watch for while using this medication? Visit your care team for regular checks on your progress. They will need to check the level of testosterone in your blood. Heart attacks and strokes have been reported with the use of this medication. Get emergency help if you develop signs or symptoms of a heart attack or stroke. Talk to your care team about the risks and benefits of this medication. This medication may affect blood sugar levels. If you have diabetes, check with your care team before you change your diet or the dose of your diabetic medication. This medication is banned from use in athletes by most athletic organizations. What side effects may I notice from receiving this medication? Side effects that you should report to your care team as soon as possible: Allergic reactions--skin rash, itching, hives, swelling of the face, lips, tongue, or throat Blood clot--pain, swelling, or warmth in the leg, shortness of breath, chest pain Heart attack--pain or tightness in the chest, shoulders, arms, or jaw, nausea, shortness of breath, cold or clammy skin, feeling faint or lightheaded Increase in blood pressure Liver injury--right upper belly  pain, loss of appetite, nausea, light-colored stool, dark yellow or brown urine, yellowing skin or eyes, unusual weakness or fatigue Mood swings, irritability, or hostility Prolonged or painful erection Sleep apnea--loud snoring, gasping during sleep, daytime  sleepiness Stroke--sudden numbness or weakness of the face, arm, or leg, trouble speaking, confusion, trouble walking, loss of balance or coordination, dizziness, severe headache, change in vision Swelling of the ankles, hands or feet Thoughts of suicide or self-harm, worsening mood, feelings of depression Side effects that usually do not require medical attention (report these to your care team if they continue or are bothersome): Acne Burping Change in sex drive or performance Diarrhea Unexpected breast tissue growth Upset stomach This list may not describe all possible side effects. Call your doctor for medical advice about side effects. You may report side effects to FDA at 1-800-FDA-1088. Where should I keep my medication? Keep out of the reach of children and pets. Store between 15 and 30 degrees C (59 and 86 degrees F). Avoid exposing the capsules to moisture (store in a dry place). Throw away any unused medication after the expiration date on the label. NOTE: This sheet is a summary. It may not cover all possible information. If you have questions about this medicine, talk to your doctor, pharmacist, or health care provider.  2024 Elsevier/Gold Standard (2022-01-26 00:00:00)

## 2024-02-22 NOTE — Progress Notes (Unsigned)
 Outpatient Endocrinology Note [name] Soohoo, MD   Patient's Name: [name] Page    DOB: July 17, 1950    MRN: [id number]  REASON OF VISIT: Follow up for hypogonadism / low testosterone .   PCP:  Katrinka Garnette KIDD, MD  HISTORY OF PRESENT ILLNESS:   [name] Page is a 73 y.o. old male with past medical history listed below, is here for follow up for hypogonadism / low testosterone .  Pertinent Hx: Patient was previously seen by Dr. Von and was last time seen in November 05, 2022.  Patient was diagnosed with hypogonadism around 2012.  Symptoms at the time of diagnosis were increased depression, decreased libido.  No history of hot flashes, increased sweating, breast enlargement, long-term use of anabolic steroid, testicular injury, head injury or mumps in the childhood.  No history of osteopenia or low impact bone fracture. At that time an afternoon testosterone  level was 214 and free testosterone  level low at 32, normal >47. He was also evaluated by a urologist and details were not available.  He was treated with testosterone  gel but he thinks this did not improve his testosterone  levels and likely did not continue treatment. Subsequently he was again evaluated in 2015 and with a low testosterone  of 239 he was started on testosterone  injections. With this he thinks she had some improvement in his fatigue and depression His dose had been adjusted from his initial starting dose of 100 mg every 2 weeks and he has been on various regimens including every 3 or 4 weeks injections. His testosterone  level had been quite variable since about 2016.  He had a history of erythrocytosis due to testosterone  treatment in the past in 2018, 2019, 2020 timeframe.   He was taking 30 mg of testosterone  weekly,was stopped in January 2024.  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  He was evaluated by ophthalmology as well.  He was evaluated  by neurology and cardiology including evaluation for coagulopathy, unrevealing for the cause of embolism.  He was on testosterone  injection therapy at that time and was stopped by neurology.  This event was 3 weeks after COVID shot.  Around timeframe of end of 2023 he had testosterone  in the mid normal range 575.  No erythrocytosis at that time with hematocrit 41.4 and hemoglobin 16.8.  Patient was advised for clomiphene  due to continued fatigue and low testosterone  level in June 2024 however patient hesitant to start due to potential risks of thromboembolism.  He also checked with neurology.  In May 02, 2023 patient was evaluated by hematology and oncology for possible embolic TIA, in the context of testosterone  therapy, recommended no routine indication of anticoagulation in this context, mention potential risks with COVID-19 booster and clotting.  In regard to testosterone  therapy, advised for close monitoring of hematocrit levels, when the therapy resumed if desired.   Interval history Patient has complaints of intermittent weakness, fatigue, myalgia.  He has low motivation.  Recent lab with testosterone  level total testosterone  level mildly low with normal free testosterone , high normal LH and elevated FSH overall consistent with mild primary hypogonadism.  Energy level better.  Labs reviewed.  Sleep trouble.  Back trouble.   Medro shot, prednisone , 1 months for back. Glucose up to 170        Lab results for cardiolipin antibodies normal recently.   Latest Reference Range & Units 08/16/23 08:05  LH 1.6 - 15.2 mIU/mL 9.8  FSH 1.4 -  12.8 mIU/mL 23.4 (H)  Glucose 65 - 99 mg/dL 887 (H)  Testosterone , Total, LC-MS-MS 250 - 1,100 ng/dL 810 (L)  Testosterone  Free 6.0 - 73.0 pg/mL 20.0  (H): Data is abnormally high (L): Data is abnormally low  Patient is accompanied by his wife in the clinic today.  REVIEW OF SYSTEMS:  As per history of present illness.   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   Cataract    forming   Depressive disorder, not elsewhere classified    Diverticulitis    X 1 EPISODE   Esophageal reflux    past hx    HTN (hypertension)    Hyperlipidemia    CONTROLLED   Lymphocytic colitis    Neuromuscular disorder (HCC)    neuropathy   Neuropathy    Other testicular hypofunction    Palpitations    Personal history of urinary calculi    LAST IN THE 80'S   Stroke [facility]) April 13, 2022    PAST SURGICAL HISTORY: Past Surgical History:  Procedure Laterality Date   CARDIAC CATHETERIZATION     COLONOSCOPY     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 States not an allergy.    Flagyl [Metronidazole Hcl] Other (See Comments)    Neuropathy    Ibuprofen Other (See Comments)   Nsaids     REACTION: intolereance- ABD PAIN AND DIARRHEA    Protonix [Pantoprazole]     Abdominal Pain and Diarrhea    FAMILY HISTORY:  Family History  Problem Relation Age of Onset   Hypertension Mother    Anxiety disorder Mother    Non-Hodgkin's lymphoma Mother    Cancer Mother    Heart disease Mother    Hyperlipidemia Mother    Varicose Veins Mother    Dementia Father    Depression Father        and anxiety   Hypertension Father    Hearing loss Father    Obesity Father    Sudden death Paternal Uncle 89   Colon cancer Paternal Uncle    Hypertension Brother     Obesity Brother    Diabetes Maternal Grandmother    Obesity Maternal Grandmother    Diabetes Paternal Grandmother    Obesity Paternal Grandmother    Early death Neg Hx    Colon polyps Neg Hx    Esophageal cancer Neg Hx    Rectal cancer Neg Hx    Stomach cancer Neg Hx     SOCIAL HISTORY: Social History   Socioeconomic History   Marital status: Married    Spouse name: Not on file   Number of children: 0   Years of education: 18   Highest education level: Bachelor's degree (e.g., BA, AB, BS)  Occupational History  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 Married 30 + years. No children, lots of critters.       Cancer Survivor- carcinoid lung cancer.   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 Santina back to work for ability to be insured.       Hobbies: music, home repair/improvement, cats   Social Drivers of Health   Financial Resource Strain: Low Risk  (12/15/2023)   Overall Financial Resource Strain (CARDIA)    Difficulty of Paying Living Expenses: Not hard at all  Food Insecurity: No Food Insecurity (12/15/2023)   Hunger Vital Sign    Worried About Running Out of Food in the Last Year: Never true    Ran Out of Food in the Last Year: Never true  Transportation Needs: No Transportation Needs (12/15/2023)   PRAPARE - Administrator, Civil Service (Medical):  No    Lack of Transportation (Non-Medical): No  Physical Activity: Sufficiently Active (12/15/2023)   Exercise Vital Sign    Days of Exercise per Week: 5 days    Minutes of Exercise per Session: 40 min  Stress: No Stress Concern Present (12/15/2023)   [employer] of Occupational Health - Occupational Stress Questionnaire    Feeling of Stress: Only a little  Social Connections: Socially Isolated (12/15/2023)   Social Connection and Isolation Panel    Frequency of Communication with Friends and Family: Once a week    Frequency of Social Gatherings with Friends and Family: Once a week    Attends Religious Services: Never    Database Administrator or Organizations: No    Attends Engineer, Structural: Not on file    Marital Status: Married    MEDICATIONS:  Current Outpatient Medications  Medication Sig Dispense Refill   Acetylcarnitine HCl (ACETYL L-CARNITINE PO) Take 500 mg by mouth.     Ascorbic Acid (VITAMIN C) 1000 MG tablet      aspirin  EC 325 MG tablet Take 325 mg by mouth daily.     betamethasone  dipropionate (DIPROLENE ) 0.05 % ointment Apply 1 application Externally Once a day 45 g 0   buPROPion  (WELLBUTRIN  XL) 300 MG 24 hr tablet TAKE 1 TABLET BY MOUTH DAILY 100 tablet 3   Cholecalciferol (VITAMIN D ) 2000 UNITS CAPS Take 1 capsule by mouth daily.     clonazePAM  (KLONOPIN ) 0.5 MG tablet Take 1 tablet (0.5 mg total) by mouth 2 (two) times daily as needed for anxiety (do not drive for 12 hours after taking). 180 tablet 1   Cyanocobalamin  (B-12) 2500 MCG TABS Take by mouth.     diltiazem  (CARTIA  XT) 180 MG 24 hr capsule TAKE 1 CAPSULE BY MOUTH DAILY 100 capsule 3   fluorouracil (EFUDEX) 5 % cream Apply to skin cancer on  right ear topically once daily. Hold for severe inflammation. 40 g 0   gabapentin  (NEURONTIN ) 300 MG capsule TAKE 3 CAPSULES BY MOUTH AT  BEDTIME 300 capsule 3   hydrochlorothiazide  (HYDRODIURIL ) 25 MG tablet Take 1 tablet (25 mg total) by mouth daily. 90  tablet 3   HYDROcodone -acetaminophen  (NORCO/VICODIN) 5-325 MG tablet Take 0.5-1 tablets by mouth 2 (two) times daily as needed. Do not use more than 4 times a week. This is intended as a 3 month supply. 55 tablet 0   methylcellulose (CITRUCEL) oral powder Use as directed once daily     potassium chloride  (KLOR-CON ) 10 MEQ tablet TAKE 3 TABLETS BY MOUTH IN THE  MORNING AND 2 TABLETS BY MOUTH  IN THE AFTERNOON 500 tablet 3   predniSONE  (DELTASONE ) 20 MG tablet Take 2 tablets (40 mg total) by mouth daily with breakfast. 10 tablet 0   rosuvastatin  (CRESTOR ) 5 MG tablet Take 1 tablet (5 mg total) by mouth daily. 90 tablet 3   No current facility-administered medications for this visit.    PHYSICAL EXAM: Vitals:   02/22/24 1023 02/22/24 1024  BP: (!) 148/80 (!) 140/80  Pulse: (!) 59   Resp: 20   SpO2: 96%   Weight: 181 lb 3.2 oz (82.2 kg)   Height: 5' 8 (1.727 m)      Body mass index is 27.55 kg/m.  Wt Readings from Last 3 Encounters:  02/22/24 181 lb 3.2 oz (82.2 kg)  01/25/24 183 lb (83 kg)  12/16/23 182 lb 6.4 oz (82.7 kg)    General: Well developed, well nourished male in no apparent distress.  HEENT: AT/[place], no external lesions. Hearing intact to the spoken word Eyes: EOMI. No proptosis, stare and lid lag. Conjunctiva clear and no icterus.  Neck: Trachea midline, neck supple without appreciable thyromegaly or lymphadenopathy and no palpable thyroid  nodules Lungs: Clear to auscultation, no wheeze. Respirations not labored Heart: S1S2, Regular in rate and rhythm. Abdomen: Soft, non tender, non distended Neurologic: Alert, oriented, normal speech, deep tendon biceps reflexes normal,  no gross focal neurological deficit. No obvious proximal weakness Extremities: No pedal pitting edema, no tremors of outstretched hands, no excessive hair growth Skin: Warm, color good. No gynecomastia.  Psychiatric: Does not appear depressed or anxious.   PERTINENT HISTORIC LABORATORY AND IMAGING  STUDIES:  All pertinent laboratory results were reviewed. Please see HPI also for further details.   ASSESSMENT / PLAN  No diagnosis found.   -Patient has history of hypogonadism at least from 2012, used to be on testosterone  therapy however was discontinued after left retinal branch artery occlusion/TIA possibly embolic in January 2024. -Patient has significantly low testosterone  level with symptoms of fatigue, low motivation/depression. -Patient was advised for clomiphene  in June 2024 however he is hesitant to take due to risks of possible thromboembolism. -When patient had possible embolic event in January 2024 he had mid normal level of testosterone  with normal hematocrit and hemoglobin.  Etiology of arterial embolism is unclear however he was on testosterone  therapy injectable and also had COVID shot about 3 weeks prior to the event.  -Patient was evaluated by hematology, in the context of testosterone  therapy, no recommendation for routine anticoagulation.  Advised to monitor hematocrit closely.  -Laboratory results with mildly low total testosterone  with normal free testosterone  and elevated gonadotropins overall consistent with mild primary hypogonadism.  Plan: -Discussed options of testosterone  therapy versus monitoring without treatment.  Testosterone  levels on the provided reference ranges are not well-established for his age  group.  Due to concern of potential blood clot patient prefers not to restart testosterone  therapy at this time. -Discussed that fatigue can have multiple etiologies, he has sleep disturbance, fatigue and snores at night.  Advised to talk with primary care provider to consider sleep study to rule out obstructive sleep apnea. -Discussed about optimizing bone health with adequate calcium  intake, he has been taking adequate dietary calcium  intake, he has adequate dietary calcium  intake.  He has also been taking vitamin D  supplement. - He had mild fasting  hyperglycemia we will check hemoglobin A1c in acceptable lab. - He has history of vitamin deficiency, will check vitamin D  level in next of lab. - Check total and free testosterone  in 6 months prior to follow-up visit.  There are no diagnoses linked to this encounter.   DISPOSITION Follow up in clinic in 6 months suggested.  Labs prior to follow-up visit as ordered.  All questions answered and patient verbalized understanding of the plan.  Dareon Nunziato, MD [facility] Endocrinology [facility] [address] [place], [address] [place], [place] [address] Phone # [phone]  At least part of this note was generated using voice recognition software. Inadvertent word errors may have occurred, which were not recognized during the proofreading process.

## 2024-02-23 ENCOUNTER — Other Ambulatory Visit (HOSPITAL_BASED_OUTPATIENT_CLINIC_OR_DEPARTMENT_OTHER): Payer: Self-pay

## 2024-02-23 ENCOUNTER — Encounter: Payer: Self-pay | Admitting: Endocrinology

## 2024-02-23 ENCOUNTER — Ambulatory Visit: Admitting: Endocrinology

## 2024-02-23 MED ORDER — CEPHALEXIN 250 MG PO CAPS
250.0000 mg | ORAL_CAPSULE | Freq: Every day | ORAL | 0 refills | Status: DC
  Filled 2024-02-23: qty 10, 10d supply, fill #0

## 2024-02-25 ENCOUNTER — Other Ambulatory Visit (HOSPITAL_BASED_OUTPATIENT_CLINIC_OR_DEPARTMENT_OTHER): Payer: Self-pay

## 2024-02-26 ENCOUNTER — Ambulatory Visit

## 2024-02-27 ENCOUNTER — Encounter

## 2024-02-28 ENCOUNTER — Ambulatory Visit

## 2024-02-28 DIAGNOSIS — G459 Transient cerebral ischemic attack, unspecified: Secondary | ICD-10-CM

## 2024-02-29 LAB — CUP PACEART REMOTE DEVICE CHECK
Date Time Interrogation Session: 20251124232419
Implantable Pulse Generator Implant Date: 20240305

## 2024-03-02 NOTE — Progress Notes (Signed)
 Remote Loop Recorder Transmission

## 2024-03-07 ENCOUNTER — Ambulatory Visit: Admitting: Family Medicine

## 2024-03-09 ENCOUNTER — Ambulatory Visit: Payer: Self-pay | Admitting: Cardiology

## 2024-03-14 ENCOUNTER — Other Ambulatory Visit: Payer: Self-pay | Admitting: Family Medicine

## 2024-03-14 ENCOUNTER — Ambulatory Visit: Admitting: Family Medicine

## 2024-03-14 ENCOUNTER — Other Ambulatory Visit (HOSPITAL_BASED_OUTPATIENT_CLINIC_OR_DEPARTMENT_OTHER): Payer: Self-pay

## 2024-03-14 ENCOUNTER — Encounter: Payer: Self-pay | Admitting: Family Medicine

## 2024-03-14 VITALS — BP 112/70 | HR 78 | Temp 98.4°F | Ht 68.0 in | Wt 182.8 lb

## 2024-03-14 DIAGNOSIS — G609 Hereditary and idiopathic neuropathy, unspecified: Secondary | ICD-10-CM

## 2024-03-14 DIAGNOSIS — E781 Pure hyperglyceridemia: Secondary | ICD-10-CM

## 2024-03-14 DIAGNOSIS — F3342 Major depressive disorder, recurrent, in full remission: Secondary | ICD-10-CM

## 2024-03-14 DIAGNOSIS — G894 Chronic pain syndrome: Secondary | ICD-10-CM | POA: Diagnosis not present

## 2024-03-14 DIAGNOSIS — Z85118 Personal history of other malignant neoplasm of bronchus and lung: Secondary | ICD-10-CM

## 2024-03-14 DIAGNOSIS — I10 Essential (primary) hypertension: Secondary | ICD-10-CM

## 2024-03-14 MED ORDER — HYDROCHLOROTHIAZIDE 25 MG PO TABS
25.0000 mg | ORAL_TABLET | Freq: Every day | ORAL | 3 refills | Status: DC
  Filled 2024-03-14: qty 90, 90d supply, fill #0

## 2024-03-14 NOTE — Patient Instructions (Addendum)
 RSV shot at the pharmacy  No changes today and labs next visit  Recommended follow up: Return in about 6 months (around 09/12/2024) for physical or sooner if needed.Schedule b4 you leave.

## 2024-03-14 NOTE — Telephone Encounter (Signed)
 Last OV: 12/16/2023  Next OV: 09/13/2024  Last Refill: 06/27/2023  Dispense: 180/1

## 2024-03-14 NOTE — Progress Notes (Signed)
 Phone [phone] In person visit   Subjective:   [name] Page is a 73 y.o. year old very pleasant male patient who presents for/with See problem oriented charting Chief Complaint  Patient presents with   Hypertension   Discuss RSV vaccine    Past Medical History-  Patient Active Problem List   Diagnosis Date Noted   Chronic pain syndrome 08/31/2016    Priority: High   History of lung cancer 12/11/2012    Priority: High   Vitamin D  deficiency 02/11/2022    Priority: Medium    Aortic atherosclerosis 11/03/2020    Priority: Medium    History of adenomatous polyp of colon 03/24/2018    Priority: Medium    Generalized anxiety disorder 03/12/2014    Priority: Medium    Essential hypertension 03/12/2014    Priority: Medium    Peripheral neuropathy, idiopathic 12/22/2012    Priority: Medium    Dyspnea 07/14/2012    Priority: Medium    Hypertriglyceridemia 07/14/2012    Priority: Medium    Depression 02/16/2010    Priority: Medium    Low testosterone  11/21/2007    Priority: Medium    Chronic bilateral low back pain 06/05/2019    Priority: Low   Venous stasis of both lower extremities 06/05/2019    Priority: Low   Mastoiditis 03/09/2017    Priority: Low   Palpitations 08/11/2016    Priority: Low   Eustachian tube dysfunction 12/17/2014    Priority: Low   SI (sacroiliac) joint dysfunction 09/19/2013    Priority: Low   Nonspecific abnormal electrocardiogram (ECG) (EKG) 07/14/2012    Priority: Low   Allergic reaction, history of 10/25/2011    Priority: Low   Rectal or anal pain 10/01/2010    Priority: Low   Allergic rhinitis 06/13/2007    Priority: Low   GERD 06/13/2007    Priority: Low   IBS (irritable bowel syndrome) 06/13/2007    Priority: Low   Lumbar trigger point syndrome 01/11/2023    Priority: 1.   Nonallopathic lesion of sacral region 09/19/2013    Priority: 1.   Nonallopathic lesion of thoracic region 09/19/2013    Priority: 1.   Nonallopathic  lesion of lumbosacral region 09/19/2013    Priority: 1.   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     Ascorbic Acid (VITAMIN C) 1000 MG tablet      aspirin  EC 325 MG tablet Take 325 mg by mouth daily.     betamethasone  dipropionate (DIPROLENE ) 0.05 % ointment Apply 1 application Externally Once a day 45 g 0   buPROPion  (WELLBUTRIN  XL) 300 MG 24 hr tablet TAKE 1 TABLET BY MOUTH DAILY 100 tablet 3   Cholecalciferol (VITAMIN D ) 2000 UNITS CAPS Take 1 capsule by mouth daily.     clonazePAM  (KLONOPIN ) 0.5 MG tablet Take 1 tablet (0.5 mg total) by mouth 2 (two) times daily as needed for anxiety (do not drive for 12 hours after taking). 180 tablet 1   Cyanocobalamin  (B-12) 2500 MCG TABS Take by mouth.     diltiazem  (CARTIA  XT) 180 MG 24 hr capsule TAKE 1 CAPSULE BY MOUTH DAILY 100 capsule 3   fluorouracil  (EFUDEX ) 5 % cream Apply to skin cancer on right ear topically once daily. Hold for severe inflammation. 40 g 0   gabapentin  (NEURONTIN ) 300 MG capsule TAKE 3  CAPSULES BY MOUTH AT  BEDTIME 300 capsule 3   HYDROcodone -acetaminophen  (NORCO/VICODIN) 5-325 MG tablet Take 0.5-1 tablets by mouth 2 (two) times daily as needed. Do not use more than 4 times a week. This is intended as a 3 month supply. 55 tablet 0   methylcellulose (CITRUCEL) oral powder Use as directed once daily     potassium chloride  (KLOR-CON ) 10 MEQ tablet TAKE 3 TABLETS BY MOUTH IN THE  MORNING AND 2 TABLETS BY MOUTH  IN THE AFTERNOON 500 tablet 3   rosuvastatin  (CRESTOR ) 5 MG tablet Take 1 tablet (5 mg total) by mouth daily. 90 tablet 3   DHEA 50 MG TABS      hydrochlorothiazide  (HYDRODIURIL ) 25 MG tablet Take 1 tablet (25 mg total) by mouth daily. 90 tablet 3   No current facility-administered medications for this visit.     Objective:  BP 112/70   Pulse 78    Temp 98.4 F (36.9 C) (Temporal)   Ht 5' 8 (1.727 m)   Wt 182 lb 12.8 oz (82.9 kg)   SpO2 98%   BMI 27.79 kg/m  Gen: NAD, resting comfortably CV: RRR no murmurs rubs or gallops Lungs: CTAB no crackles, wheeze, rhonchi Ext: no edema Skin: warm, dry     Assessment and Plan   #Health maintenance- plans on RSV vaccine at pharmacy   #History of lung cancer/actually carcinoid tumor malignant-we follow an annual chest x-ray on him.  Last chest x-ray 09/06/23.  Check annually.   #fatigue better with improved dietary intake and reducing sugar. Inspired by Maude Cory book and adding some weights.   #Chronic pain syndrome/neuropathy S: Medication: Gabapentin  600- 900 mg at bedtime as occasionally affects balance higher dose, sparing hydrocodone  3-4x a week.  He knows to space hydrocodone  and clonazepam  at least 12 hours.   A/P: overall stable. Does not need refill yet. PDMP trend low risk per last check  -UDS updated this year -Controlled substance contract on file from 07/08/2017  #hypertension/hypokalemia requiring potassium 20 mEq twice daily S: medication: hctz 25 mg, diltiazem  180 mg extended release (amlodipine  caused edema in past).   BP Readings from Last 3 Encounters:  03/14/24 112/70  02/22/24 (!) 140/80  01/25/24 124/66  A/P: has improved diet and lost some weight- blood pressure has improved- continue to monitor   # History of TIA due to embolism-prior extensive workup by neurology and cardiology with no obvious cause including cardiac monitoring.  Extensive hypercoagulable workup unremarkable with repeat lupus anticoagulant being normal.  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 Lonni Adie with left visual field disturbance in July 2024)-plans on loop recorder with Dr. Cindie #microvascular ischemia on prior MRI # Hyperlipidemia/hypertriglyceridemia. LPA not elevated # Aortic atherosclerosis-prior coronary artery calcium  score of 0 on 07/08/2020 S: Medication:Atorvastatin  40 mg--> 10mg  (patient concern about neuropathy)--> rosuvastatin  5 mg, asa 325 mg--> 81 mg Lab Results  Component Value Date   CHOL 126 12/16/2023   HDL 56.20 12/16/2023   LDLCALC 55 12/16/2023   LDLDIRECT 57.0 10/27/2022   TRIG 76.0 12/16/2023   CHOLHDL 2 12/16/2023  A/P: lipids at goal 55 on last check in September even with history of BRAO. Continue current medications    # Depression/GAD S: Medication:Wellbutrin   300 mg XL .  Uses clonazepam  1-2 times a day-we have discussed trying to cut back.  Has not tolerated SSRIs due to palpitations    03/14/2024   10:40 AM 03/14/2024   10:29 AM 12/16/2023    9:01 AM  Depression screen PHQ 2/9  Decreased Interest 0 0 0  Down, Depressed, Hopeless 0 0 0  PHQ - 2 Score 0 0 0  Altered sleeping 0  1  Tired, decreased energy 0  1  Change in appetite 0  0  Feeling bad or failure about yourself  0  0  Trouble concentrating 0  0  Moving slowly or fidgety/restless 0  0  Suicidal thoughts 0  0  PHQ-9 Score 0  2   Difficult doing work/chores Not difficult at all  Not difficult at all     Data saved with a previous flowsheet row definition   A/P: reasonable control/full remission- continue to monitor   #Hypogonadism-on testosterone  treatment in the past- stopped after BRAO in January 2024  -Opted out of clomid  due to thromboembolism risk -working with endocrinologybut currently undecided- may try an oral version  # Hyperglycemia/insulin  resistance/prediabetes-A1c 5.9 in 2023- came down to 5.6 with his lifestyle changes  #osteopenia noted on x-ray June 2024- offered DEXA- he wants to hold for now- likely related to low  testosterone   Recommended follow up: Return in about 6 months (around 09/12/2024) for physical or sooner if needed.Schedule b4 you leave. Future Appointments  Date Time Provider Department Center  03/27/2024  1:00 PM Claudene Arthea HERO, DO [facility] None  03/30/2024  7:20 AM [facility] DEVICE REMOTES [facility]  04/30/2024  7:20 AM [facility] DEVICE REMOTES [facility]  08/22/2024 11:00 AM Thapa, Sudan, MD [facility] None    Lab/Order associations:   ICD-10-CM   1. Essential hypertension  I10     2. Recurrent major depressive disorder, in full remission  F33.42     3. Hypertriglyceridemia  E78.1     4. Chronic pain syndrome  G89.4     5. History of lung cancer  Z85.118     6. Peripheral neuropathy, idiopathic  G60.9       Meds ordered this encounter  Medications   hydrochlorothiazide  (HYDRODIURIL ) 25 MG tablet    Sig: Take 1 tablet (25 mg total) by mouth daily.    Dispense:  90 tablet    Refill:  3    Return precautions advised.  Garnette Lukes, MD

## 2024-03-15 ENCOUNTER — Other Ambulatory Visit: Payer: Self-pay

## 2024-03-15 ENCOUNTER — Other Ambulatory Visit (HOSPITAL_COMMUNITY): Payer: Self-pay

## 2024-03-15 MED ORDER — CLONAZEPAM 0.5 MG PO TABS
0.5000 mg | ORAL_TABLET | Freq: Two times a day (BID) | ORAL | 1 refills | Status: AC | PRN
  Filled 2024-03-15: qty 180, 90d supply, fill #0

## 2024-03-19 ENCOUNTER — Other Ambulatory Visit (HOSPITAL_BASED_OUTPATIENT_CLINIC_OR_DEPARTMENT_OTHER): Payer: Self-pay

## 2024-03-19 ENCOUNTER — Encounter

## 2024-03-21 ENCOUNTER — Encounter: Payer: Self-pay | Admitting: Family Medicine

## 2024-03-22 NOTE — Progress Notes (Unsigned)
 [name] [address] [place] [address] Phone: [phone] Subjective:    I'm seeing this patient by the request  of:  Katrinka Garnette KIDD, MD  CC:   YEP:Dlagzrupcz  01/25/2024 Discussed with patient at great length, discussed icing regimen and home exercises, increase activity slowly.  Depo-Medrol  injection given today, discussed the possibility of prednisone  for 5 days.  Discussed vibrating plate for this individual to help with potentially some recruitment of nerve fibers for his proprioception in his peripheral neuropathy.  Increase activity slowly.  Follow-up again in 6 to 8 weeks we will get x-rays to further evaluate for any other bony abnormality with patient having a past medical history significant for lung cancer.     Updated 03/27/2024 [name] Page is a 73 y.o. 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   Cataract    forming   Depressive disorder, not elsewhere classified    Diverticulitis    X 1 EPISODE   Esophageal reflux    past hx    HTN (hypertension)    Hyperlipidemia    CONTROLLED   Lymphocytic colitis    Neuromuscular disorder (HCC)    neuropathy   Neuropathy    Other testicular hypofunction    Palpitations    Personal history of urinary calculi    LAST IN THE 80'S   Stroke [facility]) April 13, 2022   Past Surgical History:  Procedure Laterality Date   CARDIAC CATHETERIZATION     COLONOSCOPY     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   [name]   parotid tumor surgery     [place]- [place]   POLYPECTOMY     TONSILLECTOMY AND ADENOIDECTOMY     Social History   Socioeconomic History    Marital status: Married    Spouse name: Not on file   Number of children: 0   Years of education: 18   Highest education level: Bachelor's degree (e.g., BA, AB, BS)  Occupational History   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 Married 30 + years. No children, lots of critters.       Cancer Survivor- carcinoid lung cancer.   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 Santina back to work for ability to be insured.       Hobbies:  music, home repair/improvement, cats   Social Drivers of Health   Tobacco Use: Medium Risk (03/14/2024)   Patient History    Smoking Tobacco Use: Never    Smokeless Tobacco Use: Never    Passive Exposure: Yes  Financial Resource Strain: Low Risk (03/13/2024)   Overall Financial Resource Strain (CARDIA)    Difficulty of Paying Living Expenses: Not hard at all  Food Insecurity: No Food Insecurity (03/13/2024)   Epic    Worried About Programme Researcher, Broadcasting/film/video in the Last Year: Never true    Ran Out of Food in the Last Year: Never true  Transportation Needs: No Transportation Needs (03/13/2024)   Epic    Lack of Transportation (Medical): No    Lack of Transportation (Non-Medical): No  Physical Activity: Sufficiently Active (03/13/2024)   Exercise Vital Sign    Days of Exercise per Week: 4 days    Minutes of Exercise per Session: 40 min  Stress: No Stress  Concern Present (03/13/2024)   [employer] of Occupational Health - Occupational Stress Questionnaire    Feeling of Stress: Only a little  Social Connections: Socially Isolated (03/13/2024)   Social Connection and Isolation Panel    Frequency of Communication with Friends and Family: Never    Frequency of Social Gatherings with Friends and Family: Once a week    Attends Religious Services: Never    Database Administrator or Organizations: No    Attends Engineer, Structural: Not on file    Marital Status: Married  Depression (PHQ2-9): Low Risk (03/14/2024)   Depression (PHQ2-9)    PHQ-2 Score: 0  Alcohol Screen: Low Risk (03/13/2024)   Alcohol Screen    Last Alcohol Screening Score (AUDIT): 1  Housing: Unknown (03/13/2024)   Epic    Unable to Pay for Housing in the Last Year: Not on file    Number of Times Moved in the Last Year: Not on file    Homeless in the Last Year: No  Utilities: Not At Risk (07/06/2023)   AHC Utilities    Threatened with loss of utilities: No  Health Literacy: Adequate Health Literacy (07/06/2023)   B1300 Health Literacy    Frequency of need for help with medical instructions: Never   Allergies[1] Family History  Problem Relation Age of Onset   Hypertension Mother    Anxiety disorder Mother    Non-Hodgkin's lymphoma Mother    Cancer Mother    Heart disease Mother    Hyperlipidemia Mother    Varicose Veins Mother    Dementia Father    Depression Father        and anxiety   Hypertension Father    Hearing loss Father    Obesity Father    Sudden death Paternal Uncle 32   Colon cancer Paternal Uncle    Hypertension Brother    Obesity Brother    Diabetes Maternal Grandmother    Obesity Maternal Grandmother    Diabetes Paternal Grandmother    Obesity Paternal Grandmother    Early death Neg Hx    Colon polyps Neg Hx    Esophageal cancer Neg Hx    Rectal cancer Neg Hx    Stomach cancer Neg Hx     Current Outpatient Medications  (Cardiovascular):    diltiazem  (CARTIA  XT) 180 MG 24 hr capsule, TAKE 1 CAPSULE BY MOUTH DAILY   hydrochlorothiazide  (HYDRODIURIL ) 25 MG tablet, Take 1 tablet (25 mg total) by mouth daily.   rosuvastatin  (CRESTOR ) 5 MG tablet,  Take 1 tablet (5 mg total) by mouth daily.  Current Outpatient Medications (Analgesics):    aspirin  EC 325 MG tablet, Take 325 mg by mouth daily.   HYDROcodone -acetaminophen  (NORCO/VICODIN) 5-325 MG tablet, Take 0.5-1 tablets by mouth 2 (two) times daily as needed. Do not use more than 4 times a week. This is intended as a 3 month supply.  Current Outpatient Medications (Hematological):    Cyanocobalamin  (B-12) 2500 MCG TABS, Take by mouth.  Current Outpatient Medications (Other):    Acetylcarnitine HCl (ACETYL L-CARNITINE PO), Take 500 mg by mouth.   Ascorbic Acid (VITAMIN C) 1000 MG tablet,    betamethasone  dipropionate (DIPROLENE ) 0.05 % ointment, Apply 1 application Externally Once a day   buPROPion  (WELLBUTRIN  XL) 300 MG 24 hr tablet, TAKE 1 TABLET BY MOUTH DAILY   Cholecalciferol (VITAMIN D ) 2000 UNITS CAPS, Take 1 capsule by mouth daily.   clonazePAM  (KLONOPIN ) 0.5 MG tablet, Take 1 tablet (0.5 mg total) by mouth 2 (two) times daily as needed for anxiety (do not drive for 12 hours after taking).   DHEA 50 MG TABS,    fluorouracil  (EFUDEX ) 5 % cream, Apply to skin cancer on right ear topically once daily. Hold for severe inflammation.   gabapentin  (NEURONTIN ) 300 MG capsule, TAKE 3 CAPSULES BY MOUTH AT  BEDTIME   methylcellulose (CITRUCEL) oral powder, Use as directed once daily   potassium chloride  (KLOR-CON ) 10 MEQ tablet, TAKE 3 TABLETS BY MOUTH IN THE  MORNING AND 2 TABLETS BY MOUTH  IN THE AFTERNOON   Reviewed prior external information including notes and imaging from  primary care provider As well as notes that were available from care everywhere and other healthcare systems.  Past medical history, social, surgical and family history all reviewed in  electronic medical record.  No pertanent information unless stated regarding to the chief complaint.   Review of Systems:  No headache, visual changes, nausea, vomiting, diarrhea, constipation, dizziness, abdominal pain, skin rash, fevers, chills, night sweats, weight loss, swollen lymph nodes, body aches, joint swelling, chest pain, shortness of breath, mood changes. POSITIVE muscle aches  Objective  There were no vitals taken for this visit.   General: No apparent distress alert and oriented x3 mood and affect normal, dressed appropriately.  HEENT: Pupils equal, extraocular movements intact  Respiratory: Patient's speak in full sentences and does not appear short of breath  Cardiovascular: No lower extremity edema, non tender, no erythema      Impression and Recommendations:           [1]  Allergies Allergen Reactions   Bee Venom Anaphylaxis   Colestipol  Diarrhea and Other (See Comments)   Epinephrine  Other (See Comments)    Per patient request to remove this. States not an allergy.    Flagyl [Metronidazole Hcl] Other (See Comments)    Neuropathy    Ibuprofen Other (See Comments)   Nsaids     REACTION: intolereance- ABD PAIN AND DIARRHEA    Protonix [Pantoprazole]     Abdominal Pain and Diarrhea

## 2024-03-26 ENCOUNTER — Other Ambulatory Visit (HOSPITAL_BASED_OUTPATIENT_CLINIC_OR_DEPARTMENT_OTHER): Payer: Self-pay

## 2024-03-26 MED ORDER — AREXVY 120 MCG/0.5ML IM SUSR
INTRAMUSCULAR | 0 refills | Status: AC
  Filled 2024-03-26: qty 1, 1d supply, fill #0

## 2024-03-27 ENCOUNTER — Ambulatory Visit

## 2024-03-27 ENCOUNTER — Encounter: Payer: Self-pay | Admitting: Family Medicine

## 2024-03-27 ENCOUNTER — Ambulatory Visit: Admitting: Family Medicine

## 2024-03-27 VITALS — BP 114/62 | HR 63 | Ht 68.0 in | Wt 177.0 lb

## 2024-03-27 DIAGNOSIS — M533 Sacrococcygeal disorders, not elsewhere classified: Secondary | ICD-10-CM

## 2024-03-27 DIAGNOSIS — G609 Hereditary and idiopathic neuropathy, unspecified: Secondary | ICD-10-CM | POA: Diagnosis not present

## 2024-03-27 DIAGNOSIS — M549 Dorsalgia, unspecified: Secondary | ICD-10-CM | POA: Diagnosis not present

## 2024-03-27 DIAGNOSIS — Z85118 Personal history of other malignant neoplasm of bronchus and lung: Secondary | ICD-10-CM

## 2024-03-27 NOTE — Telephone Encounter (Signed)
 Appointment has been cancelled.

## 2024-03-27 NOTE — Assessment & Plan Note (Signed)
 Chronic p[roblem, discussed HEP  And discussed which activities to do and which ones to avoid.  Increase activity slowly.  Discussed icing regimen.  Discussed different treatment options including the Toradol or Depo-Medrol .  Is going to hold and talk to his wife about this.  Try to avoid chronic oral anti-inflammatories if possible.  Would have to consider other medications if needed.  At this moment though patient is doing relatively well.  Has responded well to the Toradol injection previously so we will consider it.  Follow-up with me otherwise in 3 months

## 2024-03-27 NOTE — Assessment & Plan Note (Signed)
 Likely treatment did cause some of the neuropathy.

## 2024-03-28 ENCOUNTER — Ambulatory Visit

## 2024-03-30 ENCOUNTER — Ambulatory Visit

## 2024-03-30 DIAGNOSIS — G459 Transient cerebral ischemic attack, unspecified: Secondary | ICD-10-CM

## 2024-04-01 ENCOUNTER — Encounter: Payer: Self-pay | Admitting: Family Medicine

## 2024-04-01 LAB — CUP PACEART REMOTE DEVICE CHECK
Date Time Interrogation Session: 20251225232259
Implantable Pulse Generator Implant Date: 20240305

## 2024-04-02 ENCOUNTER — Ambulatory Visit: Payer: Self-pay | Admitting: Cardiology

## 2024-04-02 ENCOUNTER — Other Ambulatory Visit (HOSPITAL_BASED_OUTPATIENT_CLINIC_OR_DEPARTMENT_OTHER): Payer: Self-pay

## 2024-04-02 MED ORDER — HYDROCODONE-ACETAMINOPHEN 5-325 MG PO TABS
0.5000 | ORAL_TABLET | Freq: Two times a day (BID) | ORAL | 0 refills | Status: AC | PRN
  Filled 2024-04-02: qty 55, 90d supply, fill #0

## 2024-04-02 NOTE — Progress Notes (Signed)
 Remote Loop Recorder Transmission

## 2024-04-13 ENCOUNTER — Ambulatory Visit: Payer: Self-pay | Admitting: Family Medicine

## 2024-04-17 ENCOUNTER — Ambulatory Visit

## 2024-04-17 DIAGNOSIS — M5442 Lumbago with sciatica, left side: Secondary | ICD-10-CM

## 2024-04-17 DIAGNOSIS — G8929 Other chronic pain: Secondary | ICD-10-CM

## 2024-04-17 MED ORDER — METHYLPREDNISOLONE ACETATE 80 MG/ML IJ SUSP
80.0000 mg | Freq: Once | INTRAMUSCULAR | Status: AC
  Administered 2024-04-17: 80 mg via INTRAMUSCULAR

## 2024-04-17 NOTE — Progress Notes (Signed)
 Patient received Methylprednisolone  80 mg in right buttocks. Patient tolerated well.  Patient declined Toradol. Stated that he got it last time and did not like it and that he does not do well with NSAIDs.

## 2024-04-27 ENCOUNTER — Other Ambulatory Visit: Payer: Self-pay

## 2024-04-27 MED ORDER — DILTIAZEM HCL ER COATED BEADS 180 MG PO CP24: 180.0000 mg | ORAL_CAPSULE | Freq: Every day | ORAL | 3 refills | Status: AC

## 2024-04-27 MED ORDER — POTASSIUM CHLORIDE ER 10 MEQ PO TBCR: EXTENDED_RELEASE_TABLET | ORAL | 3 refills | Status: AC

## 2024-04-27 MED ORDER — ROSUVASTATIN CALCIUM 5 MG PO TABS: 5.0000 mg | ORAL_TABLET | Freq: Every day | ORAL | 3 refills | Status: AC

## 2024-04-30 ENCOUNTER — Ambulatory Visit: Payer: Self-pay | Attending: Cardiology

## 2024-04-30 DIAGNOSIS — G459 Transient cerebral ischemic attack, unspecified: Secondary | ICD-10-CM | POA: Diagnosis not present

## 2024-04-30 LAB — CUP PACEART REMOTE DEVICE CHECK
Date Time Interrogation Session: 20260125232643
Implantable Pulse Generator Implant Date: 20240305

## 2024-05-01 ENCOUNTER — Ambulatory Visit: Payer: Self-pay | Admitting: Cardiology

## 2024-05-01 ENCOUNTER — Other Ambulatory Visit: Payer: Self-pay

## 2024-05-01 MED ORDER — BUPROPION HCL ER (XL) 300 MG PO TB24: 300.0000 mg | ORAL_TABLET | Freq: Every day | ORAL | 3 refills | Status: AC

## 2024-05-01 MED ORDER — HYDROCHLOROTHIAZIDE 25 MG PO TABS: 25.0000 mg | ORAL_TABLET | Freq: Every day | ORAL | 3 refills | Status: AC

## 2024-05-03 MED ORDER — GABAPENTIN 300 MG PO CAPS: 900.0000 mg | ORAL_CAPSULE | Freq: Every day | ORAL | 3 refills | Status: AC

## 2024-05-08 NOTE — Progress Notes (Signed)
 Remote Loop Recorder Transmission

## 2024-05-31 ENCOUNTER — Ambulatory Visit

## 2024-07-01 ENCOUNTER — Ambulatory Visit

## 2024-07-26 ENCOUNTER — Ambulatory Visit: Admitting: Family Medicine

## 2024-08-01 ENCOUNTER — Ambulatory Visit

## 2024-08-22 ENCOUNTER — Ambulatory Visit: Admitting: Endocrinology

## 2024-09-01 ENCOUNTER — Ambulatory Visit

## 2024-09-13 ENCOUNTER — Encounter: Admitting: Family Medicine

## 2024-10-02 ENCOUNTER — Encounter: Admitting: Family Medicine

## 2024-10-02 ENCOUNTER — Ambulatory Visit

## 2024-11-02 ENCOUNTER — Ambulatory Visit

## 2024-12-03 ENCOUNTER — Ambulatory Visit

## 2025-01-03 ENCOUNTER — Ambulatory Visit

## 2025-02-03 ENCOUNTER — Ambulatory Visit

## 2025-03-06 ENCOUNTER — Ambulatory Visit

## 2025-04-06 ENCOUNTER — Ambulatory Visit
